# Patient Record
Sex: Female | Born: 1980 | Race: Black or African American | Hispanic: No | Smoking: Never smoker
Health system: Southern US, Community
[De-identification: ages and names within clinical notes are randomized; demographics above are authoritative.]

## PROBLEM LIST (undated history)

## (undated) DIAGNOSIS — F319 Bipolar disorder, unspecified: Secondary | ICD-10-CM

## (undated) DIAGNOSIS — IMO0001 Reserved for inherently not codable concepts without codable children: Secondary | ICD-10-CM

## (undated) DIAGNOSIS — F329 Major depressive disorder, single episode, unspecified: Secondary | ICD-10-CM

## (undated) DIAGNOSIS — T4145XA Adverse effect of unspecified anesthetic, initial encounter: Secondary | ICD-10-CM

## (undated) DIAGNOSIS — Z86711 Personal history of pulmonary embolism: Secondary | ICD-10-CM

## (undated) DIAGNOSIS — E0590 Thyrotoxicosis, unspecified without thyrotoxic crisis or storm: Secondary | ICD-10-CM

## (undated) DIAGNOSIS — F32A Depression, unspecified: Secondary | ICD-10-CM

## (undated) DIAGNOSIS — K8020 Calculus of gallbladder without cholecystitis without obstruction: Secondary | ICD-10-CM

## (undated) DIAGNOSIS — T8859XA Other complications of anesthesia, initial encounter: Secondary | ICD-10-CM

## (undated) DIAGNOSIS — I10 Essential (primary) hypertension: Secondary | ICD-10-CM

## (undated) DIAGNOSIS — Z86718 Personal history of other venous thrombosis and embolism: Secondary | ICD-10-CM

## (undated) DIAGNOSIS — K439 Ventral hernia without obstruction or gangrene: Secondary | ICD-10-CM

## (undated) HISTORY — PX: BREAST SURGERY: SHX581

## (undated) HISTORY — PX: PILONIDAL CYST / SINUS EXCISION: SUR543

## (undated) HISTORY — PX: HERNIA REPAIR: SHX51

## (undated) HISTORY — PX: OTHER SURGICAL HISTORY: SHX169

---
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
## 2001-06-19 ENCOUNTER — Encounter (INDEPENDENT_AMBULATORY_CARE_PROVIDER_SITE_OTHER): Payer: Self-pay | Admitting: Specialist

## 2001-06-19 ENCOUNTER — Inpatient Hospital Stay (HOSPITAL_COMMUNITY): Admission: AD | Admit: 2001-06-19 | Discharge: 2001-06-22 | Payer: Self-pay | Admitting: Obstetrics

## 2003-06-18 ENCOUNTER — Emergency Department (HOSPITAL_COMMUNITY): Admission: EM | Admit: 2003-06-18 | Discharge: 2003-06-18 | Payer: Self-pay | Admitting: Emergency Medicine

## 2003-08-05 ENCOUNTER — Emergency Department (HOSPITAL_COMMUNITY): Admission: EM | Admit: 2003-08-05 | Discharge: 2003-08-05 | Payer: Self-pay | Admitting: Emergency Medicine

## 2004-06-02 ENCOUNTER — Emergency Department (HOSPITAL_COMMUNITY): Admission: EM | Admit: 2004-06-02 | Discharge: 2004-06-02 | Payer: Self-pay | Admitting: Emergency Medicine

## 2004-06-06 ENCOUNTER — Emergency Department (HOSPITAL_COMMUNITY): Admission: EM | Admit: 2004-06-06 | Discharge: 2004-06-06 | Payer: Self-pay | Admitting: Family Medicine

## 2004-10-16 ENCOUNTER — Ambulatory Visit (HOSPITAL_COMMUNITY): Admission: RE | Admit: 2004-10-16 | Discharge: 2004-10-16 | Payer: Self-pay | Admitting: Obstetrics & Gynecology

## 2004-12-27 ENCOUNTER — Emergency Department (HOSPITAL_COMMUNITY): Admission: EM | Admit: 2004-12-27 | Discharge: 2004-12-27 | Payer: Self-pay | Admitting: Emergency Medicine

## 2004-12-28 ENCOUNTER — Inpatient Hospital Stay (HOSPITAL_COMMUNITY): Admission: AD | Admit: 2004-12-28 | Discharge: 2004-12-28 | Payer: Self-pay | Admitting: Obstetrics & Gynecology

## 2005-02-19 ENCOUNTER — Emergency Department (HOSPITAL_COMMUNITY): Admission: EM | Admit: 2005-02-19 | Discharge: 2005-02-19 | Payer: Self-pay | Admitting: Emergency Medicine

## 2005-03-25 ENCOUNTER — Inpatient Hospital Stay (HOSPITAL_COMMUNITY): Admission: AD | Admit: 2005-03-25 | Discharge: 2005-03-27 | Payer: Self-pay | Admitting: Obstetrics

## 2005-12-09 ENCOUNTER — Emergency Department (HOSPITAL_COMMUNITY): Admission: EM | Admit: 2005-12-09 | Discharge: 2005-12-10 | Payer: Self-pay | Admitting: *Deleted

## 2006-02-21 ENCOUNTER — Emergency Department (HOSPITAL_COMMUNITY): Admission: EM | Admit: 2006-02-21 | Discharge: 2006-02-21 | Payer: Self-pay | Admitting: Emergency Medicine

## 2006-02-26 ENCOUNTER — Emergency Department (HOSPITAL_COMMUNITY): Admission: EM | Admit: 2006-02-26 | Discharge: 2006-02-26 | Payer: Self-pay | Admitting: Emergency Medicine

## 2006-03-07 ENCOUNTER — Emergency Department (HOSPITAL_COMMUNITY): Admission: EM | Admit: 2006-03-07 | Discharge: 2006-03-07 | Payer: Self-pay | Admitting: Emergency Medicine

## 2006-03-25 ENCOUNTER — Encounter (INDEPENDENT_AMBULATORY_CARE_PROVIDER_SITE_OTHER): Payer: Self-pay | Admitting: Specialist

## 2006-03-25 ENCOUNTER — Ambulatory Visit (HOSPITAL_COMMUNITY): Admission: RE | Admit: 2006-03-25 | Discharge: 2006-03-25 | Payer: Self-pay | Admitting: General Surgery

## 2006-04-12 ENCOUNTER — Inpatient Hospital Stay (HOSPITAL_COMMUNITY): Admission: AD | Admit: 2006-04-12 | Discharge: 2006-04-12 | Payer: Self-pay | Admitting: Obstetrics & Gynecology

## 2006-07-24 ENCOUNTER — Inpatient Hospital Stay (HOSPITAL_COMMUNITY): Admission: AD | Admit: 2006-07-24 | Discharge: 2006-07-24 | Payer: Self-pay | Admitting: Obstetrics & Gynecology

## 2006-08-23 ENCOUNTER — Inpatient Hospital Stay (HOSPITAL_COMMUNITY): Admission: AD | Admit: 2006-08-23 | Discharge: 2006-08-23 | Payer: Self-pay | Admitting: Obstetrics & Gynecology

## 2006-09-14 ENCOUNTER — Inpatient Hospital Stay (HOSPITAL_COMMUNITY): Admission: AD | Admit: 2006-09-14 | Discharge: 2006-09-17 | Payer: Self-pay | Admitting: Obstetrics & Gynecology

## 2006-12-27 ENCOUNTER — Ambulatory Visit (HOSPITAL_COMMUNITY): Admission: RE | Admit: 2006-12-27 | Discharge: 2006-12-27 | Payer: Self-pay | Admitting: General Surgery

## 2006-12-27 ENCOUNTER — Encounter (INDEPENDENT_AMBULATORY_CARE_PROVIDER_SITE_OTHER): Payer: Self-pay | Admitting: Specialist

## 2007-03-19 ENCOUNTER — Emergency Department (HOSPITAL_COMMUNITY): Admission: EM | Admit: 2007-03-19 | Discharge: 2007-03-20 | Payer: Self-pay | Admitting: Emergency Medicine

## 2007-03-27 ENCOUNTER — Emergency Department (HOSPITAL_COMMUNITY): Admission: EM | Admit: 2007-03-27 | Discharge: 2007-03-27 | Payer: Self-pay | Admitting: Emergency Medicine

## 2007-04-06 ENCOUNTER — Emergency Department (HOSPITAL_COMMUNITY): Admission: EM | Admit: 2007-04-06 | Discharge: 2007-04-06 | Payer: Self-pay | Admitting: Emergency Medicine

## 2007-05-02 ENCOUNTER — Encounter: Admission: RE | Admit: 2007-05-02 | Discharge: 2007-05-30 | Payer: Self-pay | Admitting: General Surgery

## 2007-05-16 ENCOUNTER — Encounter: Admission: RE | Admit: 2007-05-16 | Discharge: 2007-05-16 | Payer: Self-pay | Admitting: General Surgery

## 2007-06-13 ENCOUNTER — Ambulatory Visit (HOSPITAL_COMMUNITY): Admission: RE | Admit: 2007-06-13 | Discharge: 2007-06-13 | Payer: Self-pay | Admitting: Obstetrics & Gynecology

## 2007-08-26 ENCOUNTER — Inpatient Hospital Stay (HOSPITAL_COMMUNITY): Admission: AD | Admit: 2007-08-26 | Discharge: 2007-08-26 | Payer: Self-pay | Admitting: Obstetrics

## 2007-08-26 ENCOUNTER — Inpatient Hospital Stay (HOSPITAL_COMMUNITY): Admission: AD | Admit: 2007-08-26 | Discharge: 2007-08-26 | Payer: Self-pay | Admitting: Obstetrics & Gynecology

## 2007-08-29 ENCOUNTER — Ambulatory Visit (HOSPITAL_COMMUNITY): Admission: RE | Admit: 2007-08-29 | Discharge: 2007-08-29 | Payer: Self-pay | Admitting: Obstetrics & Gynecology

## 2007-08-31 ENCOUNTER — Inpatient Hospital Stay (HOSPITAL_COMMUNITY): Admission: AD | Admit: 2007-08-31 | Discharge: 2007-09-05 | Payer: Self-pay | Admitting: Obstetrics & Gynecology

## 2007-09-01 ENCOUNTER — Encounter: Payer: Self-pay | Admitting: Obstetrics & Gynecology

## 2007-09-09 ENCOUNTER — Inpatient Hospital Stay (HOSPITAL_COMMUNITY): Admission: AD | Admit: 2007-09-09 | Discharge: 2007-09-11 | Payer: Self-pay | Admitting: Obstetrics & Gynecology

## 2007-09-21 ENCOUNTER — Emergency Department (HOSPITAL_COMMUNITY): Admission: EM | Admit: 2007-09-21 | Discharge: 2007-09-21 | Payer: Self-pay | Admitting: *Deleted

## 2007-11-22 ENCOUNTER — Emergency Department (HOSPITAL_COMMUNITY): Admission: EM | Admit: 2007-11-22 | Discharge: 2007-11-23 | Payer: Self-pay | Admitting: Family Medicine

## 2008-01-22 ENCOUNTER — Emergency Department (HOSPITAL_COMMUNITY): Admission: EM | Admit: 2008-01-22 | Discharge: 2008-01-22 | Payer: Self-pay | Admitting: Emergency Medicine

## 2008-02-03 ENCOUNTER — Ambulatory Visit (HOSPITAL_COMMUNITY): Admission: RE | Admit: 2008-02-03 | Discharge: 2008-02-03 | Payer: Self-pay | Admitting: Obstetrics

## 2008-02-24 ENCOUNTER — Ambulatory Visit: Payer: Self-pay | Admitting: Vascular Surgery

## 2008-06-11 ENCOUNTER — Emergency Department (HOSPITAL_COMMUNITY): Admission: EM | Admit: 2008-06-11 | Discharge: 2008-06-11 | Payer: Self-pay | Admitting: Emergency Medicine

## 2008-09-09 ENCOUNTER — Emergency Department (HOSPITAL_COMMUNITY): Admission: EM | Admit: 2008-09-09 | Discharge: 2008-09-09 | Payer: Self-pay | Admitting: Emergency Medicine

## 2008-09-14 ENCOUNTER — Encounter (INDEPENDENT_AMBULATORY_CARE_PROVIDER_SITE_OTHER): Payer: Self-pay | Admitting: Surgery

## 2008-09-14 ENCOUNTER — Ambulatory Visit (HOSPITAL_COMMUNITY): Admission: RE | Admit: 2008-09-14 | Discharge: 2008-09-14 | Payer: Self-pay | Admitting: Surgery

## 2008-09-21 ENCOUNTER — Emergency Department (HOSPITAL_COMMUNITY): Admission: EM | Admit: 2008-09-21 | Discharge: 2008-09-21 | Payer: Self-pay | Admitting: Emergency Medicine

## 2008-10-29 ENCOUNTER — Emergency Department (HOSPITAL_COMMUNITY): Admission: EM | Admit: 2008-10-29 | Discharge: 2008-10-29 | Payer: Self-pay | Admitting: Emergency Medicine

## 2009-01-13 ENCOUNTER — Emergency Department (HOSPITAL_COMMUNITY): Admission: EM | Admit: 2009-01-13 | Discharge: 2009-01-14 | Payer: Self-pay | Admitting: Emergency Medicine

## 2009-01-22 ENCOUNTER — Ambulatory Visit (HOSPITAL_BASED_OUTPATIENT_CLINIC_OR_DEPARTMENT_OTHER): Admission: RE | Admit: 2009-01-22 | Discharge: 2009-01-22 | Payer: Self-pay | Admitting: Surgery

## 2009-01-22 ENCOUNTER — Encounter (INDEPENDENT_AMBULATORY_CARE_PROVIDER_SITE_OTHER): Payer: Self-pay | Admitting: Surgery

## 2009-03-08 ENCOUNTER — Encounter: Admission: RE | Admit: 2009-03-08 | Discharge: 2009-03-08 | Payer: Self-pay | Admitting: Family Medicine

## 2009-03-23 ENCOUNTER — Inpatient Hospital Stay (HOSPITAL_COMMUNITY): Admission: AD | Admit: 2009-03-23 | Discharge: 2009-03-23 | Payer: Self-pay | Admitting: Obstetrics and Gynecology

## 2009-05-13 ENCOUNTER — Inpatient Hospital Stay (HOSPITAL_COMMUNITY): Admission: AD | Admit: 2009-05-13 | Discharge: 2009-05-13 | Payer: Self-pay | Admitting: Obstetrics & Gynecology

## 2009-05-17 ENCOUNTER — Ambulatory Visit (HOSPITAL_COMMUNITY): Admission: RE | Admit: 2009-05-17 | Discharge: 2009-05-17 | Payer: Self-pay | Admitting: Obstetrics

## 2009-06-25 ENCOUNTER — Ambulatory Visit (HOSPITAL_COMMUNITY): Admission: RE | Admit: 2009-06-25 | Discharge: 2009-06-25 | Payer: Self-pay | Admitting: Obstetrics & Gynecology

## 2009-07-06 ENCOUNTER — Inpatient Hospital Stay (HOSPITAL_COMMUNITY): Admission: AD | Admit: 2009-07-06 | Discharge: 2009-07-07 | Payer: Self-pay | Admitting: Obstetrics

## 2009-07-07 ENCOUNTER — Emergency Department (HOSPITAL_COMMUNITY): Admission: EM | Admit: 2009-07-07 | Discharge: 2009-07-07 | Payer: Self-pay | Admitting: Emergency Medicine

## 2009-08-19 ENCOUNTER — Ambulatory Visit (HOSPITAL_COMMUNITY): Admission: RE | Admit: 2009-08-19 | Discharge: 2009-08-19 | Payer: Self-pay | Admitting: Obstetrics

## 2009-09-10 ENCOUNTER — Ambulatory Visit (HOSPITAL_COMMUNITY): Admission: RE | Admit: 2009-09-10 | Discharge: 2009-09-10 | Payer: Self-pay | Admitting: Obstetrics

## 2009-09-16 ENCOUNTER — Inpatient Hospital Stay (HOSPITAL_COMMUNITY): Admission: AD | Admit: 2009-09-16 | Discharge: 2009-09-18 | Payer: Self-pay | Admitting: Obstetrics & Gynecology

## 2009-10-01 ENCOUNTER — Ambulatory Visit (HOSPITAL_COMMUNITY): Admission: RE | Admit: 2009-10-01 | Discharge: 2009-10-01 | Payer: Self-pay | Admitting: Obstetrics

## 2009-10-03 ENCOUNTER — Inpatient Hospital Stay (HOSPITAL_COMMUNITY): Admission: AD | Admit: 2009-10-03 | Discharge: 2009-10-03 | Payer: Self-pay | Admitting: Obstetrics & Gynecology

## 2009-10-08 ENCOUNTER — Ambulatory Visit: Payer: Self-pay | Admitting: Obstetrics and Gynecology

## 2009-10-08 ENCOUNTER — Inpatient Hospital Stay (HOSPITAL_COMMUNITY): Admission: AD | Admit: 2009-10-08 | Discharge: 2009-10-08 | Payer: Self-pay | Admitting: Obstetrics

## 2009-10-10 ENCOUNTER — Inpatient Hospital Stay (HOSPITAL_COMMUNITY): Admission: AD | Admit: 2009-10-10 | Discharge: 2009-10-10 | Payer: Self-pay | Admitting: Obstetrics

## 2009-10-11 ENCOUNTER — Ambulatory Visit (HOSPITAL_COMMUNITY): Admission: RE | Admit: 2009-10-11 | Discharge: 2009-10-11 | Payer: Self-pay | Admitting: Obstetrics

## 2009-10-15 ENCOUNTER — Inpatient Hospital Stay (HOSPITAL_COMMUNITY): Admission: AD | Admit: 2009-10-15 | Discharge: 2009-10-15 | Payer: Self-pay | Admitting: Obstetrics & Gynecology

## 2009-10-21 ENCOUNTER — Inpatient Hospital Stay (HOSPITAL_COMMUNITY): Admission: AD | Admit: 2009-10-21 | Discharge: 2009-10-21 | Payer: Self-pay | Admitting: Obstetrics & Gynecology

## 2009-10-22 ENCOUNTER — Ambulatory Visit (HOSPITAL_COMMUNITY): Admission: RE | Admit: 2009-10-22 | Discharge: 2009-10-22 | Payer: Self-pay | Admitting: Obstetrics

## 2009-10-25 ENCOUNTER — Inpatient Hospital Stay (HOSPITAL_COMMUNITY): Admission: AD | Admit: 2009-10-25 | Discharge: 2009-10-25 | Payer: Self-pay | Admitting: Obstetrics

## 2009-10-25 ENCOUNTER — Ambulatory Visit: Payer: Self-pay | Admitting: Family

## 2009-10-27 ENCOUNTER — Inpatient Hospital Stay (HOSPITAL_COMMUNITY): Admission: AD | Admit: 2009-10-27 | Discharge: 2009-10-31 | Payer: Self-pay | Admitting: Obstetrics

## 2009-10-27 ENCOUNTER — Encounter: Payer: Self-pay | Admitting: Obstetrics

## 2010-02-15 ENCOUNTER — Emergency Department (HOSPITAL_COMMUNITY): Admission: EM | Admit: 2010-02-15 | Discharge: 2010-02-15 | Payer: Self-pay | Admitting: Emergency Medicine

## 2010-04-10 ENCOUNTER — Emergency Department (HOSPITAL_COMMUNITY): Admission: EM | Admit: 2010-04-10 | Discharge: 2010-04-10 | Payer: Self-pay | Admitting: Emergency Medicine

## 2010-04-21 ENCOUNTER — Emergency Department (HOSPITAL_COMMUNITY): Admission: EM | Admit: 2010-04-21 | Discharge: 2010-04-21 | Payer: Self-pay | Admitting: Emergency Medicine

## 2010-05-23 ENCOUNTER — Emergency Department (HOSPITAL_COMMUNITY): Admission: EM | Admit: 2010-05-23 | Discharge: 2010-05-24 | Payer: Self-pay | Admitting: Emergency Medicine

## 2010-05-24 ENCOUNTER — Emergency Department (HOSPITAL_COMMUNITY): Admission: EM | Admit: 2010-05-24 | Discharge: 2010-05-24 | Payer: Self-pay | Admitting: Emergency Medicine

## 2010-09-28 ENCOUNTER — Encounter: Payer: Self-pay | Admitting: Obstetrics

## 2010-11-20 LAB — URINALYSIS, ROUTINE W REFLEX MICROSCOPIC
Hgb urine dipstick: NEGATIVE
Ketones, ur: NEGATIVE mg/dL
Protein, ur: NEGATIVE mg/dL
Urobilinogen, UA: 0.2 mg/dL (ref 0.0–1.0)

## 2010-11-20 LAB — BASIC METABOLIC PANEL
BUN: 5 mg/dL — ABNORMAL LOW (ref 6–23)
CO2: 26 mEq/L (ref 19–32)
Calcium: 9.1 mg/dL (ref 8.4–10.5)
Creatinine, Ser: 0.63 mg/dL (ref 0.4–1.2)
GFR calc Af Amer: >60 mL/min (ref >60)
Glucose, Bld: 96 mg/dL (ref 70–99)

## 2010-11-20 LAB — HEPATIC FUNCTION PANEL
Bilirubin, Direct: 0.2 mg/dL (ref 0.0–0.3)
Indirect Bilirubin: 0.6 mg/dL (ref 0.3–0.9)
Total Protein: 7.5 g/dL (ref 6.0–8.3)

## 2010-11-20 LAB — CBC
MCH: 33 pg (ref 26.0–34.0)
MCHC: 35.2 g/dL (ref 30.0–36.0)
MCV: 94 fL (ref 78.0–100.0)
Platelets: ADEQUATE 10*3/uL (ref 150–400)
RDW: 12.5 % (ref 11.5–15.5)

## 2010-11-20 LAB — DIFFERENTIAL
Basophils Absolute: 0 10*3/uL (ref 0.0–0.1)
Lymphs Abs: 1 10*3/uL (ref 0.7–4.0)
Monocytes Absolute: 0.2 10*3/uL (ref 0.1–1.0)
Neutro Abs: 5.7 10*3/uL (ref 1.7–7.7)

## 2010-11-21 LAB — URINE MICROSCOPIC-ADD ON

## 2010-11-21 LAB — DIFFERENTIAL
Eosinophils Relative: 4 % (ref 0–5)
Lymphocytes Relative: 42 % (ref 12–46)
Lymphs Abs: 2.4 10*3/uL (ref 0.7–4.0)
Monocytes Absolute: 0.3 10*3/uL (ref 0.1–1.0)
Monocytes Relative: 6 % (ref 3–12)

## 2010-11-21 LAB — URINALYSIS, ROUTINE W REFLEX MICROSCOPIC
Bilirubin Urine: NEGATIVE
Ketones, ur: NEGATIVE mg/dL
Nitrite: NEGATIVE
Specific Gravity, Urine: 1.021 (ref 1.005–1.030)
Urobilinogen, UA: 0.2 mg/dL (ref 0.0–1.0)
pH: 7.5 (ref 5.0–8.0)

## 2010-11-21 LAB — CBC
MCH: 32.8 pg (ref 26.0–34.0)
MCHC: 34.9 g/dL (ref 30.0–36.0)
Platelets: 380 10*3/uL (ref 150–400)
RBC: 4.07 MIL/uL (ref 3.87–5.11)

## 2010-11-21 LAB — COMPREHENSIVE METABOLIC PANEL
AST: 20 U/L (ref 0–37)
Albumin: 3.8 g/dL (ref 3.5–5.2)
Calcium: 9.1 mg/dL (ref 8.4–10.5)
Chloride: 105 mEq/L (ref 96–112)
Creatinine, Ser: 0.67 mg/dL (ref 0.4–1.2)
GFR calc Af Amer: >60 mL/min (ref >60)
Total Protein: 7 g/dL (ref 6.0–8.3)

## 2010-11-23 LAB — URINALYSIS, ROUTINE W REFLEX MICROSCOPIC
Glucose, UA: NEGATIVE mg/dL
Ketones, ur: >80 mg/dL — AB
pH: 6 (ref 5.0–8.0)

## 2010-11-23 LAB — COMPREHENSIVE METABOLIC PANEL
ALT: 16 U/L (ref 0–35)
Albumin: 2.9 g/dL — ABNORMAL LOW (ref 3.5–5.2)
Alkaline Phosphatase: 100 U/L (ref 39–117)
BUN: 3 mg/dL — ABNORMAL LOW (ref 6–23)
Chloride: 104 mEq/L (ref 96–112)
Potassium: 3.7 mEq/L (ref 3.5–5.1)
Sodium: 134 mEq/L — ABNORMAL LOW (ref 135–145)
Total Bilirubin: 1.1 mg/dL (ref 0.3–1.2)

## 2010-11-23 LAB — CBC
HCT: 39.4 % (ref 36.0–46.0)
Hemoglobin: 13.5 g/dL (ref 12.0–15.0)
Platelets: 256 10*3/uL (ref 150–400)
WBC: 8.6 10*3/uL (ref 4.0–10.5)

## 2010-11-23 LAB — URINE MICROSCOPIC-ADD ON

## 2010-11-23 LAB — URINE CULTURE

## 2010-11-28 LAB — COMPREHENSIVE METABOLIC PANEL
ALT: 12 U/L (ref 0–35)
ALT: 14 U/L (ref 0–35)
Albumin: 2.9 g/dL — ABNORMAL LOW (ref 3.5–5.2)
Alkaline Phosphatase: 103 U/L (ref 39–117)
Alkaline Phosphatase: 124 U/L — ABNORMAL HIGH (ref 39–117)
BUN: 3 mg/dL — ABNORMAL LOW (ref 6–23)
CO2: 19 mEq/L (ref 19–32)
Calcium: 8.9 mg/dL (ref 8.4–10.5)
Chloride: 108 mEq/L (ref 96–112)
GFR calc Af Amer: >60 mL/min (ref >60)
GFR calc non Af Amer: >60 mL/min (ref >60)
Glucose, Bld: 75 mg/dL (ref 70–99)
Glucose, Bld: 99 mg/dL (ref 70–99)
Potassium: 3.4 mEq/L — ABNORMAL LOW (ref 3.5–5.1)
Potassium: 3.5 mEq/L (ref 3.5–5.1)
Sodium: 135 mEq/L (ref 135–145)
Total Bilirubin: 0.6 mg/dL (ref 0.3–1.2)
Total Protein: 6.1 g/dL (ref 6.0–8.3)

## 2010-11-28 LAB — URINALYSIS, ROUTINE W REFLEX MICROSCOPIC
Bilirubin Urine: NEGATIVE
Glucose, UA: NEGATIVE mg/dL
Ketones, ur: 15 mg/dL — AB
Ketones, ur: 40 mg/dL — AB
Nitrite: NEGATIVE
Nitrite: POSITIVE — AB
Protein, ur: NEGATIVE mg/dL
Specific Gravity, Urine: >1.03 — ABNORMAL HIGH (ref 1.005–1.030)
pH: 6 (ref 5.0–8.0)

## 2010-11-28 LAB — URINE CULTURE
Colony Count: 30000
Colony Count: 40000

## 2010-11-28 LAB — CBC
HCT: 26.9 % — ABNORMAL LOW (ref 36.0–46.0)
HCT: 33.2 % — ABNORMAL LOW (ref 36.0–46.0)
HCT: 34 % — ABNORMAL LOW (ref 36.0–46.0)
Hemoglobin: 11.4 g/dL — ABNORMAL LOW (ref 12.0–15.0)
Hemoglobin: 11.8 g/dL — ABNORMAL LOW (ref 12.0–15.0)
Hemoglobin: 9.2 g/dL — ABNORMAL LOW (ref 12.0–15.0)
MCHC: 34.8 g/dL (ref 30.0–36.0)
MCHC: 34.9 g/dL (ref 30.0–36.0)
MCV: 96.9 fL (ref 78.0–100.0)
Platelets: 251 10*3/uL (ref 150–400)
Platelets: 252 10*3/uL (ref 150–400)
Platelets: 259 10*3/uL (ref 150–400)
RBC: 2.73 MIL/uL — ABNORMAL LOW (ref 3.87–5.11)
RBC: 3.4 MIL/uL — ABNORMAL LOW (ref 3.87–5.11)
RBC: 3.42 MIL/uL — ABNORMAL LOW (ref 3.87–5.11)
RDW: 12.9 % (ref 11.5–15.5)
RDW: 12.9 % (ref 11.5–15.5)
RDW: 13 % (ref 11.5–15.5)
RDW: 13.1 % (ref 11.5–15.5)
WBC: 7.5 10*3/uL (ref 4.0–10.5)
WBC: 7.6 10*3/uL (ref 4.0–10.5)

## 2010-11-28 LAB — LACTATE DEHYDROGENASE: LDH: 172 U/L (ref 94–250)

## 2010-11-28 LAB — URIC ACID
Uric Acid, Serum: 5.4 mg/dL (ref 2.4–7.0)
Uric Acid, Serum: 6 mg/dL (ref 2.4–7.0)

## 2010-11-28 LAB — URINE MICROSCOPIC-ADD ON

## 2010-11-28 LAB — RPR: RPR Ser Ql: NONREACTIVE

## 2010-12-12 LAB — COMPREHENSIVE METABOLIC PANEL
AST: 24 U/L (ref 0–37)
Albumin: 3.2 g/dL — ABNORMAL LOW (ref 3.5–5.2)
Alkaline Phosphatase: 58 U/L (ref 39–117)
BUN: 3 mg/dL — ABNORMAL LOW (ref 6–23)
Chloride: 107 mEq/L (ref 96–112)
GFR calc Af Amer: >60 mL/min (ref >60)
Potassium: 3.7 mEq/L (ref 3.5–5.1)
Sodium: 136 mEq/L (ref 135–145)
Total Protein: 6.1 g/dL (ref 6.0–8.3)

## 2010-12-12 LAB — URINALYSIS, ROUTINE W REFLEX MICROSCOPIC
Bilirubin Urine: NEGATIVE
Glucose, UA: NEGATIVE mg/dL
Hgb urine dipstick: NEGATIVE
Specific Gravity, Urine: 1.01 (ref 1.005–1.030)

## 2010-12-12 LAB — CBC
HCT: 38.2 % (ref 36.0–46.0)
Platelets: 249 10*3/uL (ref 150–400)
RDW: 12.5 % (ref 11.5–15.5)
WBC: 10.8 10*3/uL — ABNORMAL HIGH (ref 4.0–10.5)

## 2010-12-12 LAB — URINE MICROSCOPIC-ADD ON: RBC / HPF: NONE SEEN RBC/hpf (ref <3)

## 2010-12-22 LAB — BASIC METABOLIC PANEL
BUN: 7 mg/dL (ref 6–23)
CO2: 24 mEq/L (ref 19–32)
Calcium: 9.8 mg/dL (ref 8.4–10.5)
Glucose, Bld: 50 mg/dL — ABNORMAL LOW (ref 70–99)
Sodium: 141 mEq/L (ref 135–145)

## 2010-12-22 LAB — CBC
HCT: 40.7 % (ref 36.0–46.0)
Hemoglobin: 13.9 g/dL (ref 12.0–15.0)
MCHC: 34.2 g/dL (ref 30.0–36.0)
Platelets: 344 10*3/uL (ref 150–400)
RDW: 12.4 % (ref 11.5–15.5)

## 2011-01-06 ENCOUNTER — Emergency Department (HOSPITAL_COMMUNITY)
Admission: EM | Admit: 2011-01-06 | Discharge: 2011-01-07 | Disposition: A | Discharge status: Home / Self Care | Payer: Medicaid Other | Attending: Emergency Medicine | Admitting: Emergency Medicine

## 2011-01-06 DIAGNOSIS — R21 Rash and other nonspecific skin eruption: Secondary | ICD-10-CM | POA: Insufficient documentation

## 2011-01-06 DIAGNOSIS — I889 Nonspecific lymphadenitis, unspecified: Secondary | ICD-10-CM | POA: Insufficient documentation

## 2011-01-06 DIAGNOSIS — I10 Essential (primary) hypertension: Secondary | ICD-10-CM | POA: Insufficient documentation

## 2011-01-07 ENCOUNTER — Emergency Department (HOSPITAL_COMMUNITY)
Admission: EM | Admit: 2011-01-07 | Discharge: 2011-01-07 | Disposition: A | Discharge status: Home / Self Care | Payer: Medicaid Other | Attending: Emergency Medicine | Admitting: Emergency Medicine

## 2011-01-07 DIAGNOSIS — R21 Rash and other nonspecific skin eruption: Secondary | ICD-10-CM | POA: Insufficient documentation

## 2011-01-07 DIAGNOSIS — R599 Enlarged lymph nodes, unspecified: Secondary | ICD-10-CM | POA: Insufficient documentation

## 2011-01-07 DIAGNOSIS — L299 Pruritus, unspecified: Secondary | ICD-10-CM | POA: Insufficient documentation

## 2011-01-07 DIAGNOSIS — Z79899 Other long term (current) drug therapy: Secondary | ICD-10-CM | POA: Insufficient documentation

## 2011-01-07 DIAGNOSIS — I10 Essential (primary) hypertension: Secondary | ICD-10-CM | POA: Insufficient documentation

## 2011-01-07 DIAGNOSIS — I889 Nonspecific lymphadenitis, unspecified: Secondary | ICD-10-CM | POA: Insufficient documentation

## 2011-01-07 DIAGNOSIS — M542 Cervicalgia: Secondary | ICD-10-CM | POA: Insufficient documentation

## 2011-01-20 NOTE — H&P (Signed)
NAME:  [name], [name] NO.:  [id number]   MEDICAL RECORD NO.:  [id number]          PATIENT TYPE:  INP   LOCATION:  9171                          FACILITY:  [facility]   PHYSICIAN:  Roseanna Rainbow, M.D.DATE OF BIRTH:  1981-04-15   DATE OF ADMISSION:  08/31/2007  DATE OF DISCHARGE:                              HISTORY & PHYSICAL   CHIEF COMPLAINT:  The patient is a 30 year old, P3 with an estimated  date of confinement of September 13, 2007, with elevated blood pressures  and a headache.   HISTORY OF PRESENT ILLNESS:  This patient had been followed in the  office for pregnancy-induced hypertension.  She also reports onset of a  headache for several hours prior to presentation that is frontal.  She  also describes blurred vision.   ALLERGIES:  PENICILLIN.   MEDICATIONS:  Please see the medication reconciliation form.   OB RISK FACTORS:  Please see the above GBS positive.   PRENATAL LABORATORY DATA:  Platelet count 270,000.  Hemoglobin 13,  hematocrit 38.  Urine culture and sensitivity insignificant growth.  A 3-  hour GTT normal.  A 1-hour GTT 151.  HIV nonreactive.  Sickle cell  negative.  Rubella immune.  RPR nonreactive.  Blood type O positive,  antibody screen negative.  Hepatitis B surface antigen negative.  GC  probe negative.  Pap smear negative.  Chlamydia probe negative.   PAST OBSTETRICAL HISTORY:  She is status post four NSVDs, uncomplicated.   PAST GYNECOLOGICAL HISTORY:  She denies.   PAST MEDICAL HISTORY:  Migraine headaches, chronic right upper extremity  lymphedema, hidradenitis suppurativa.   PAST SURGICAL HISTORY:  Removal of sweat glands, bilateral axillary x2.   SOCIAL HISTORY:  She is a Futures trader.  She is engaged living with the  significant.  Does not give any significant history of alcohol usage.  She has no significant smoking history.  Denies illicit drug use.   FAMILY HISTORY:  Hypertension, kidney stones, migraine headaches.   PHYSICAL EXAMINATION:  VITAL SIGNS:  Blood pressures 140-160s/90s-110s.  Fetal heart tracing reassuring.  Tocodynamometer with irregular uterine  contractions.  GENERAL:  Mild distress.  NEUROLOGIC:  Nonfocal.  PELVIC:  Per the RN, the cervix is closed, posterior and long.   LABORATORY DATA AND X-RAY FINDINGS:  Hemoglobin 12, hematocrit 33.7,  platelet count 223,000.  Creatinine 0.71.  SGOT and SGPT 21 and 11.  LDH  182.  Uric acid 7.2.  Urinalysis 30 mg/dL protein.   ASSESSMENT:  Multipara at term, severe pregnancy-induced hypertension  with fetal heart tracing consistent with fetal well-being.  Unfavorable  Bishop score.  Group B Streptococcus positive.   PLAN:  1. Admission.  2. Induction of labor.  Will begin with Cervidil to be followed by      Pitocin and AROM.  GBS prophylaxis in labor.  Magnesium sulfate      seizure prophylaxis.  Analgesics for headache as needed.      Roseanna Rainbow, M.D.  Electronically Signed     LAJ/MEDQ  D:  08/31/2007  T:  09/01/2007  Job:  [id number]

## 2011-01-20 NOTE — Consult Note (Signed)
NEW PATIENT CONSULTATION   [name], [name]  DOB:  [birth year]                                       02/24/2008  YHCWC#:[id number]   The patient presents for evaluation of lymphedema in her right arm.  She  has had a history of surgery for hydradenitis bilaterally in both axilla  in the past.  This was on the left arm in 2007 and April of 2008 on the  right by Dr. Karn Pickler.  She had some wound-healing issues apparently in  her right axilla and subsequent total healing.  She has noted  progressive swelling since that time.  She has seen several specialists,  including physical therapy, with evaluation of lymphedema and does have  a lymphedema stocking on her right arm.  Apparently, it had been  recommended that she undergo lymphangiogram, but deferred this due to  pain.  Currently, she has minimal swelling and minimal discomfort in her  right arm.   PAST HISTORY:  Significant for hypertension.  Does have non-insulin-  dependent diabetes.  She has no history of premature atherosclerotic  disease in her family.   SOCIAL HISTORY:  She is single, but engaged.  She has 5 children.  She  does not work outside the home.  She does not smoke, or drink alcohol.   REVIEW OF SYSTEMS:  Positive for weight loss.  She does weigh 185  pounds.  She is 5 feet 1 inches tall.  She does report chest pain and  tightness, prior history of dizziness, headaches, changes in eyesight.   MEDICATION ALLERGIES:  Penicillin.   CURRENT MEDICATIONS:  Toprol.   PHYSICAL EXAM:  Well-developed white female appearing stated age of 3.  She does have a lymphedema stocking on currently, and this was removed  for evaluation.  She does not have any thickening currently, and has  mild swelling compared to her left arm.  She does have a palpable radial  pulse.  She has undergone prior venous duplex evaluation.   I had a long discussion with the patient.  I explained that this is a  chronic problem  that does not have any surgical treatment.  I would not  recommend any further invasive studies such as lymphangiogram.  I  explained the importance of continued wearing of compression garments to  prevent progression of the changes of lymphedema, and also the  importance of being established with a certified lymphedema therapist.  We have referred her to Alvira Monday at [facility] for establishment of long-term  lymphedema treatment and monitoring.  She will see [place] on an as needed  basis.   Larina Earthly, M.D.  Electronically Signed   TFE/MEDQ  D:  02/24/2008  T:  02/27/2008  Job:  [id number]   cc:   Jocelyn Lamer D. Pecola Leisure, M.D.  Alvira Monday

## 2011-01-20 NOTE — H&P (Signed)
NAME:  [name], [name] NO.:  [id number]   MEDICAL RECORD NO.:  [id number]          PATIENT TYPE:  OBV   LOCATION:  9317                          FACILITY:  [facility]   PHYSICIAN:  Roseanna Rainbow, M.D.DATE OF BIRTH:  08-Sep-1980   DATE OF ADMISSION:  09/09/2007  DATE OF DISCHARGE:                              HISTORY & PHYSICAL   CHIEF COMPLAINT:  The patient is a 30 year old status post a recent  NSVD, now complaining of fever and back pain.   HISTORY OF PRESENT ILLNESS:  Please see the above.  She recently had a  vaginal delivery.  Antepartum and intrapartum, she was managed for  severe pregnancy- induced hypertension.  She reports young children at  home, having a recent viral illness.  She reports minimal dysuria.  She  does complain of lower back pain in the midline.  She reports fevers  with a T-max of 103 at home.   ALLERGIES:  PENICILLIN.   MEDICATIONS:  Please see the reconciliation form.   PAST OBSTETRICAL HISTORY:  She is status post five NSVDs.  Please see  the above.   PAST GYN HISTORY:  She denies.   PAST MEDICAL HISTORY:  Migraine headaches, chronic right upper extremity  lymphedema, hidradenitis suppurativa.   PAST SURGICAL HISTORY:  Removal of sweat gland, axillary bilateral.   SOCIAL HISTORY:  She is a homemaker.  She is engaged, living with her  significant other.  She does not give any significant history of alcohol  usage.  She has no significant smoking history.  She denies illicit drug  use.   FAMILY HISTORY:  Chronic hypertension, kidney stones and migraine  headaches.   REVIEW OF SYSTEMS:  GU:  Scant lochia, see the above.  GENERAL:  Please  see the above.  SKIN:  Breast.  She is not to nursing.  She denies any  breast engorgement.   PHYSICAL EXAM:  VITAL SIGNS:  Blood pressure 133/87, initial temperature  98.9, T-max 102.6, pulse 109.  GENERAL:  Nontoxic-appearing.  NEUROLOGIC:  Exam is nonfocal.  No nuchal rigidity.   Negative Kernig's,  Brudzinski's.  ABDOMEN:  Fundus nontender, firm, pelvic exam deferred.  EXTREMITIES:  Nontender.  BACK:  No costovertebral angle tenderness.  BREAST:  Soft and nontender.   LABORATORY:  Urinalysis, catheterized specimen negative.  CBC:  White  blood cell count 16,000, hemoglobin 12.3.  Metabolic profile was normal.   ASSESSMENT:  Postpartum fever, questionable origin.  Recent exposure to  likely viral illness.   PLAN:  Admission.  Follow white blood cell count.  We will check blood  cultures, urine culture and sensitivity.  Broad-spectrum parenteral  antibiotics, IV hydration and supportive care.  Continue  antihypertensive medications.      Roseanna Rainbow, M.D.  Electronically Signed     LAJ/MEDQ  D:  09/10/2007  T:  09/10/2007  Job:  [id number]

## 2011-01-20 NOTE — Op Note (Signed)
NAME:  [name], YOON NO.:  [id number]   MEDICAL RECORD NO.:  [id number]          PATIENT TYPE:  AMB   LOCATION:  DSC                          FACILITY:  [facility]   PHYSICIAN:  Sandria Bales. Ezzard Standing, M.D.  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   PROCEDURE:  Excision of chronically infected wound approximately 1 x 4  cm skin incision.   SURGEON:  Sandria Bales. Ezzard Standing, MD   ANESTHESIA:  10 mL of 1% Xylocaine.   COMPLICATIONS:  None.   INDICATION FOR PROCEDURE:  Ms. [name] is a 29 year old black female who  has had recurrent infections in the right medial inframammary fold.  She  now comes for excision of this area.  She understands the risk including  infection, bleeding and recurrence of the infections.   PROCEDURE:  The patient in supine position her medial inframammary fold  was prepped with antibiotic solution and sterilely draped.  I  infiltrated the skin with 10 mL of 1% Xylocaine.  I then tried to  ellipse out the scar and nodule that I could palpate.  The excised area  totaled about a 1 x 4 cm incision.  I irrigated the wound, closed the  wound with interrupted 3-0 nylon sutures.  I plan to leave these in for  10-14 days and remove them.  She knows to call for any interval problem.      Sandria Bales. Ezzard Standing, M.D.  Electronically Signed     DHN/MEDQ  D:  01/22/2009  T:  01/23/2009  Job:  [id number]   cc:   Jocelyn Lamer D. Pecola Leisure, M.D.

## 2011-01-20 NOTE — Op Note (Signed)
NAME:  [name], [name] NO.:  [id number]   MEDICAL RECORD NO.:  [id number]          PATIENT TYPE:  AMB   LOCATION:  SDS                          FACILITY:  [facility]   PHYSICIAN:  Sandria Bales. Ezzard Standing, M.D.  DATE OF BIRTH:  1981-06-11   DATE OF PROCEDURE:  09/14/2008  DATE OF DISCHARGE:  09/14/2008                               OPERATIVE REPORT   PREOPERATIVE DIAGNOSIS:  Pilonidal sinus with recurrent inflammation.   POSTOPERATIVE DIAGNOSIS:  Pilonidal sinus with recurrent inflammation.   PROCEDURE:  Excision of pilonidal cyst/sinus.   SURGEON:  Sandria Bales. Ezzard Standing, MD   No first assistant.   ANESTHESIA:  General.   ESTIMATED BLOOD LOSS:  Minimal.   INDICATIONS FOR PROCEDURE:  Ms. [name] is a 30 year old black female who  is a patient of Dr. Leilani Able who had a pilonidal cyst with recurrent  inflammation for 3-4 years.  She actually saw Dr. Lurene Shadow at one time but  never had this operated on  and now comes for excision of this cyst.   The indications and potential complications were explained to the  patient.  Potential complication include , but are not limited to,  bleeding, infection, recurrence of cyst, and nerve injury.   OPERATIVE NOTE:  The patient was placed in a prone position with her  buttocks taped apart.  Between her buttocks and over the pilonidal cyst,  she was prepped with CHG and sterilely draped.  A time-out was held  identifying the patient and the procedure.   I first tried to inject the sinus with methylene blue but did not have  much success.  I then excised an area about 4 cm in length of tissue  which included the pilonidal area and I think I removed the sinus  tracts.   I then packed the wound with Betadine gauze and sterilely draped it.  She will start her dressing changes tomorrow morning, see me back next  week in about 4-5 days for wound check.     Sandria Bales. Ezzard Standing, M.D.  Electronically Signed    DHN/MEDQ  D:  09/14/2008  T:   09/15/2008  Job:  [id number]   cc:   Jocelyn Lamer D. Pecola Leisure, M.D.

## 2011-01-23 NOTE — Discharge Summary (Signed)
NAME:  [name], [name] NO.:  [id number]   MEDICAL RECORD NO.:  [id number]          PATIENT TYPE:  INP   LOCATION:  9373                          [facility]ACILITY[facility]   PHYSICIAN:  Charles A. Clearance Coots, M.D.DATE OF BIRTH:  June 14, 1981   DATE OF ADMISSION:  08/31/2007  DATE OF DISCHARGE:  09/05/2007                               DISCHARGE SUMMARY   ADMITTING DIAGNOSES:  1. Term pregnancy.  2. Pregnancy-induced hypertension.   DISCHARGE DIAGNOSES:  1. Term pregnancy.  2. Pregnancy-induced hypertension.  3. Status post normal spontaneous vaginal delivery viable female infant      on September 01, 2007 at 1452  Apgar's of 8 at 1 minute and 9 at 5 minutes, weight of 3679 grams,  length of 53.34 cm.  Mother and infant discharged home in good  condition.   REASON FOR ADMISSION:  This is a 30 year old para 3 with estimated date  of confinement of September 13, 2007 presented with increased blood  pressures and headache.  The patient was followed in the office for  pregnancy-induced hypertension.  She also reports onset of headache for  several hours prior to presentation that was frontal. The patient also  describes blurred vision   PAST MEDICAL HISTORY:  Surgery removal sweat glands in bilateral  axillary areas x2.   ILLNESSES:  Migraines, chronic upper extremity lymphedema, hidradenitis  suppurativa.   MEDICATIONS:  Prenatal vitamins.   ALLERGIES:  PENICILLIN.   SOCIAL HISTORY:  Homemaker. Engaged living with significant other.  Negative history of tobacco, alcohol or recreational drug use.   PHYSICAL EXAMINATION:  GENERAL:  Well-developed, well-nourished female  in no acute distress.  VITAL SIGNS:  Afebrile, blood pressure 140-160/90-110.  LUNGS:  Clear to auscultation bilaterally.  HEART:  Regular rate and rhythm.  ABDOMEN: Gravid and nontender.  PELVIC:  Cervix long, closed and vertex at minus three station.   IMPRESSION:  1. Term pregnancy.  2. Pregnancy-induced  hypertension.   PLAN:  Admitted for induction of labor.   ADMITTING LABORATORY VALUES:  Hemoglobin 12, hematocrit 33, white blood  cell count 6500, platelets 223,000.  Comprehensive metabolic panel was  within normal limits.   HOSPITAL COURSE:  The patient was admitted and underwent cervical  ripening with Cervidil overnight followed by Pitocin following morning.  Magnesium sulfate and seizure prophylaxis was also instituted.  The  patient progressed rapidly to normal spontaneous vaginal delivery of a  viable infant on September 01, 2007.  There were no complications.  Postpartum course was uncomplicated.  The patient was discharged home on  postpartum day #4 in good condition. Discharge laboratory values  hemoglobin 9.7, hematocrit 27.5, white blood cell count 11,200,  platelets 176,000.   DISCHARGE DISPOSITION:  Medications continue prenatal vitamins.  Ibuprofen was prescribed for pain labetalol and Norvasc was prescribed  for blood pressure control.  Routine written instructions were given for  discharge after vaginal delivery.  The patient was given preeclampsia  precautions.  She is to call the office for a follow-up appointment 2  weeks.      Charles A. Clearance Coots, M.D.  Electronically Signed  CAH/MEDQ  D:  09/27/2007  T:  09/28/2007  Job:  [id number]

## 2011-01-23 NOTE — Op Note (Signed)
NAMECELE, MOTE NO.:  [id number]   MEDICAL RECORD NO.:  [id number]          PATIENT TYPE:  AMB   LOCATION:  SDS                          FACILITY:  [facility]   PHYSICIAN:  Leonie Man, M.D.   DATE OF BIRTH:  06/26/1981   DATE OF PROCEDURE:  12/27/2006  DATE OF DISCHARGE:                               OPERATIVE REPORT   PREOPERATIVE DIAGNOSIS:  Hidradenitis, right axilla.   POSTOPERATIVE DIAGNOSIS:  Hidradenitis, right axilla.   PROCEDURE:  Excision of hidradenitis, left axilla.   SURGEON:  Leonie Man, M.D.   ASSISTANT:  Magnus Ivan, RNFA.   ANESTHESIA:  General.   NOTE:  Ms. [name] is a 30 year old female status post excision of  hidradenitis of the left axilla who presents now with significant  hidradenitis of the right axilla extending from the anterior to  posterior axillary line down onto the medial upper arm and down onto the  upper portion of her breast.  This has been previously treated with  antibiotics on several occasions without ever completely clearing up.  The patient comes to the operating room now after a 10-day course of  cephalexin for excision of this area of hidradenitis.   PROCEDURE:  Following the induction of satisfactory general anesthesia,  the patient is positioned supinely and turned slightly to the left with  the right arm extended upward and supported with pillows.  The axilla is  prepped and draped to be included in a sterile operative field.   I made a very large elliptical incision extending from just above the  anterior portion of the deltoid on the right, inclusive of all of the  axillary skin tissue and carried down to just beyond the anterior border  of the latissimus dorsi muscle.  The incision is deepened through skin  and partially through the skin and subcutaneous tissues, and the entire  axillary fat pad is excised and removed and forwarded for pathologic  evaluation.  Hemostasis was obtained with  electrocautery and with suture  ligatures of 3-0 Vicryl.  Sponge and instrument counts were then  verified.  The subcutaneous tissues were closed with multiple sutures of  3-0 Vicryl.  The skin was closed with a running suture of 4-0 Monocryl  and then reinforced with Steri-Strips.  Sterile dressings were applied,  the anesthetic reversed, and the patient removed from the operating room  to the recovery room in stable condition.  She tolerated the procedure  well.      Leonie Man, M.D.  Electronically Signed     PB/MEDQ  D:  12/27/2006  T:  12/27/2006  Job:  [id number]

## 2011-01-23 NOTE — Op Note (Signed)
[name], [name] NO.:  [id number]   MEDICAL RECORD NO.:  [id number]          PATIENT TYPE:  AMB   LOCATION:  SDS                          FACILITY:  [facility]   PHYSICIAN:  Leonie Man, M.D.   DATE OF BIRTH:  July 16, 1981   DATE OF PROCEDURE:  03/25/2006  DATE OF DISCHARGE:                                 OPERATIVE REPORT   PREOPERATIVE DIAGNOSIS:  Hidradenitis left axilla.   POSTOPERATIVE DIAGNOSIS:  Hidradenitis left axilla.   PROCEDURE:  Excision hidradenitis left axilla.   SURGEON:  Leonie Man, M.D.   ASSISTANT:  RNFA   ANESTHESIA:  General.   NOTE:  Ms. [name] is a 29 year old female who is currently approximately [redacted]  weeks gestation with severe hidradenitis of the left axilla.  She presents,  now, for excision of this area after following treatment with antibiotics.  She understands the risks and potential benefits of surgery; including the  risks to the fetus, but wishes to proceed.  Nonetheless, I spoke with Dr.  Tamela Oddi prior to bringing the patient; and she told me that it would  be all right for the patient to undergo surgery.   DESCRIPTION OF PROCEDURE:  Following the induction of satisfactory general  anesthesia with the patient positioned supinely, the left arm was extended  laterally and the left side of the chest turned up, so as to expose the area  of hidradenitis.  This area appeared to measure approximately 15 x 5 cm.  An  elliptical incision is made around the entire mass, leaving a margin of at  least 1 cm.  This was deepened through the skin and  subcutaneous tissue;  and the entire elliptical wedge of axillary skin was removed; and forwarded  for pathologic evaluation.  There was no deep tissue infected noted.   Sponge, instrument, and sharp counts were noted to be correct; and the wound  was closed in layers as follows: The subcutaneous tissue was closed with  interrupted sutures of 2-0 Vicryl; and the skin was closed  with running  suture of 4-0 Monocryl.  This was then reinforced with Steri-Strips.  Sterile dressings applied.  The anesthetic reversed; the patient removed  from the operating room to the recovery room in stable condition.  Tolerated  the procedure well.      Leonie Man, M.D.  Electronically Signed     PB/MEDQ  D:  03/25/2006  T:  03/25/2006  Job:  [id number]

## 2011-01-23 NOTE — H&P (Signed)
[facility]  Patient:    [name]                        MRN: [id number] Adm. Date:  [id number] Attending:  Leonard Schwartz Dictator:   Miguel Dibble, C.N.M.                         History and Physical  DATE OF BIRTH:                Feb 19, 1981.  HISTORY OF PRESENT ILLNESS:   This is an 30 year old gravida 1, para 0 at 40-2/[redacted] weeks pregnant who has been experiencing early labor for the last 36 hours. She has progressed from 1.5 cm at approximately noon yesterday to 4 cm, 90% effaced, vertex at -2, intact bag of waters.  She denies any leaking or bleeding.  She has a history of negative group beta strep.  She is a teen with adequate social support; her mother is present with her.  Father of the baby is no longer involved.  She has had an uncomplicated pregnancy and had entered prenatal care at 24 weeks.  PRENATAL LABORATORY DATA:     At entry into the practice:  Hemoglobin 11.7, hematocrit 33.3, platelets 305,000.  Blood type and Rh:  O-positive, Rh-antibodies negative.  Sickle cell trait negative.  VDRL nonreactive.  Rubella titer positive. Hepatitis B surface antigen negative.  HIV nonreactive.  Pap smear within normal limits.  Gonorrhea and Chlamydia cultures negative.  HSV is negative.  Glucose challenge test is within normal limits.  Group beta strep at 36 weeks is negative.  ALLERGIES:                    No known drug allergies.  MEDICAL HISTORY:              Usual childhood diseases.  UTIs in the past.  FAMILY HISTORY:               Maternal grandmother and maternal grandfather with MIs.  Mother with hypertension, on medication.  Maternal aunt with insulin-dependent diabetes mellitus.  GENETIC HISTORY:              Negative.  SOCIAL HISTORY:               Father of the baby is not currently involved; mother and family members providing adequate social support.  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  Father of the baby was also a 12th grade student.  Denies smoking, alcohol or drug abuse.  PHYSICAL EXAMINATION:  HEENT:                        Within normal limits.  LUNGS:                        Bilaterally clear.  HEART:                        Regular rate and rhythm.  ABDOMEN:                      Soft, nontender.  Contractions every two to three  minutes.  Fetal heart rate is reassuring.  PELVIC:  Cervix is 4 cm, 90% effaced, vertex -2, slightly posterior.  Intact bag of waters.  EXTREMITIES:                  Trace edema.  DTRs +1.  ASSESSMENT:                   1. Primigravida at term, teen, with adequate support.                               2. Negative group beta streptococcus.  PLAN:                         Admit to labor and delivery.  Notify Dr. Georgina Peer of admission and status.  Routine [facility] orders including analgesia.  Anticipate normal spontaneous vaginal delivery. DD:  10/02/99 TD:  10/02/99 Job: [id number]

## 2011-01-23 NOTE — Discharge Summary (Signed)
[facility]  Patient:    [name]                        MRN: [id number] Adm. Date:  [id number] Disc. Date: [id number] Attending:  Leonard Schwartz Dictator:   Erin Sons, C.N.M.                           Discharge Summary  ADMISSION DIAGNOSES:          1. Intrauterine pregnancy at term.                               2. Early labor.  DISCHARGE DIAGNOSES:          1. Meconium stained fluid.                               2. Maternal fever.                               3. Nonreassuring fetal heart rate in second stage.                               4. Neonatal intensive care unit infant.                               5. Mild anemia.                               6. Left groin cellulitis.  PROCEDURE:                    1) Artificial rupture of membranes. 2) Amnioinfusion.  3) Pitocin augmentation.  4) Normal spontaneous vaginal delivery.  5) Repair of second degree midline episiotomy.  HOSPITAL COURSE:              Ms. [name] is an 30 year old, gravida 1, para 0, at 40-2/7 weeks who presented on October 02, 1999, in early labor.  At the time of  admission temperature was 100.1.  Contractions were 2 to 3 minutes.  Cervical examination was 2 to 3 cm, completely effaced.  Membranes were noted to be intact. Fetal heart rate were reassuring.  Pregnancy had been followed at [facility] Gynecology and had been remarkable for:  1) Last menstrual period. 2) Adolescent pregnancy.  Subsequent to admission the patient continued to labor. The fetus was determined to be in a possible OP presentation.  She made slow change. Artificial rupture of membranes was accomplished at approximately 2:30 .m. on October 02, 1999.  There was moderately thick meconium stained fluid noted.  Cervix at that time was 7 to 8 cm, vertex at -3 to -2 station.  Epidural was placed per patient request.  Hypotonic uterine contractions were noted as  the cervix began to demonstrate a slight swollen anterior lip.  Pitocin augmentation was begun. The patient had some sporadic variables.  By 8 p.m. on the evening of October 02, 1999, the cervix was complete.  Temperature was 101.2.  Antibiotics were started and Tylenol suppository was given.  Amnioinfusion was  initiated secondary to moderate meconium stained fluid.  The patient pushed for approximately three hours.  The  vertex descended to a +3 to +4 station.  The patient began to have deep variables. A midline episiotomy was performed and Georgina Peer, M.D. was notified to request his presence at delivery.  The infant was DeLeed on the perineum, but no aspirative material was obtained.  There was a loose nuchal cord and a second loop around the shoulders.  Neonatologist was present for delivery.  A spontaneous vaginal delivery of a female, [name], was accomplished.  Apgars were 2, 3, and 7.  Weight was 7 pounds 3 ounces.  Cord pHs were done.  Both venous and arterial with venous of 6.91 and arterial of 7.36.  Placenta was noted to be meconium stained.  The infant was taken to NICU secondary to poor tone and to rule out meconium aspiration.  The episiotomy was repaired in the usual fashion.  By  postpartum day #1, the patient was doing well physically.  The baby was in NICU on the ventilator, but was improving.  Was extubated soon after that.  The patient had elected bottlefeed and planned OCPs for contraception.  Hemoglobin on postpartum day #1 was 9.6 down from 12.6.  Later that day, the patient began to note pain nd drainage from her left groin area.  A small amount of discharge from a boil-type area was noted.  Cultures were done, both anaerobic and aerobic, documented positive and gram-positive cocci.  This area was evaluated on the morning of October 05, 1999, by Erin Sons, C.N.M. and Janine Limbo, M.D.  This area appeared to be an area of  localized cellulitis with a small sinus underneath the skin.  There was a small amount of brown serosanguineous fluid noted to be draining without any foul odor.  The patient was afebrile.  Her WBC count was within normal limits.  Consultation was held with Janine Limbo, M.D. and the decision was made to place her on Keflex p.o. dosing.  The infant was continuing to improve nd was anticipated to have approximately three to four-day additional stay in NICU. Lochia was within normal limits, fundus was firm.  The patient was deemed to have received the full benefit of her hospital stay and was discharged home.  DISCHARGE INSTRUCTIONS:       Per [facility]necology handout.  DISCHARGE MEDICATIONS:        1. Motrin 600 mg p.o. q.6h. p.r.n. pain.                               2. Tylox one to two p.o. q.3-4h. p.r.n. pain.                               3. Ortho-Tricyclen one p.o. q.d.                               4. Keflex 250 mg q.i.d. x 7 days.  FOLLOW-UP:                    Discharge follow-up will occur in six weeks at [facility] Gyne[facility]. DD:  10/05/99 TD:  10/06/99 Job: [id number]

## 2011-01-30 ENCOUNTER — Emergency Department (HOSPITAL_COMMUNITY)
Admission: EM | Admit: 2011-01-30 | Discharge: 2011-01-30 | Disposition: A | Discharge status: Home / Self Care | Payer: Medicaid Other | Attending: Emergency Medicine | Admitting: Emergency Medicine

## 2011-01-30 DIAGNOSIS — L2989 Other pruritus: Secondary | ICD-10-CM | POA: Insufficient documentation

## 2011-01-30 DIAGNOSIS — R82998 Other abnormal findings in urine: Secondary | ICD-10-CM | POA: Insufficient documentation

## 2011-01-30 DIAGNOSIS — I10 Essential (primary) hypertension: Secondary | ICD-10-CM | POA: Insufficient documentation

## 2011-01-30 DIAGNOSIS — R195 Other fecal abnormalities: Secondary | ICD-10-CM | POA: Insufficient documentation

## 2011-01-30 DIAGNOSIS — R5383 Other fatigue: Secondary | ICD-10-CM | POA: Insufficient documentation

## 2011-01-30 DIAGNOSIS — L298 Other pruritus: Secondary | ICD-10-CM | POA: Insufficient documentation

## 2011-01-30 DIAGNOSIS — I498 Other specified cardiac arrhythmias: Secondary | ICD-10-CM | POA: Insufficient documentation

## 2011-01-30 DIAGNOSIS — R5381 Other malaise: Secondary | ICD-10-CM | POA: Insufficient documentation

## 2011-01-30 LAB — DIFFERENTIAL
Basophils Relative: 0 % (ref 0–1)
Eosinophils Absolute: 0.1 10*3/uL (ref 0.0–0.7)
Monocytes Absolute: 0.7 10*3/uL (ref 0.1–1.0)
Monocytes Relative: 14 % — ABNORMAL HIGH (ref 3–12)
Neutrophils Relative %: 33 % — ABNORMAL LOW (ref 43–77)

## 2011-01-30 LAB — POCT PREGNANCY, URINE: Preg Test, Ur: NEGATIVE

## 2011-01-30 LAB — CBC
HCT: 39.1 % (ref 36.0–46.0)
Hemoglobin: 14 g/dL (ref 12.0–15.0)
MCV: 86.5 fL (ref 78.0–100.0)
Platelets: 279 10*3/uL (ref 150–400)
RBC: 4.52 MIL/uL (ref 3.87–5.11)
WBC: 4.8 10*3/uL (ref 4.0–10.5)

## 2011-01-30 LAB — POCT I-STAT, CHEM 8
Chloride: 104 mEq/L (ref 96–112)
Glucose, Bld: 80 mg/dL (ref 70–99)
HCT: 41 % (ref 36.0–46.0)
Potassium: 4.3 mEq/L (ref 3.5–5.1)

## 2011-01-30 LAB — URINALYSIS, ROUTINE W REFLEX MICROSCOPIC
Glucose, UA: NEGATIVE mg/dL
Nitrite: NEGATIVE
Specific Gravity, Urine: 1.028 (ref 1.005–1.030)
pH: 5 (ref 5.0–8.0)

## 2011-01-30 LAB — T4, FREE: Free T4: 4.96 ng/dL — ABNORMAL HIGH (ref 0.80–1.80)

## 2011-02-18 ENCOUNTER — Emergency Department (HOSPITAL_COMMUNITY)
Admission: EM | Admit: 2011-02-18 | Discharge: 2011-02-19 | Disposition: A | Discharge status: Home / Self Care | Payer: Medicaid Other | Attending: Emergency Medicine | Admitting: Emergency Medicine

## 2011-02-18 DIAGNOSIS — Z79899 Other long term (current) drug therapy: Secondary | ICD-10-CM | POA: Insufficient documentation

## 2011-02-18 DIAGNOSIS — J3489 Other specified disorders of nose and nasal sinuses: Secondary | ICD-10-CM | POA: Insufficient documentation

## 2011-02-18 DIAGNOSIS — J069 Acute upper respiratory infection, unspecified: Secondary | ICD-10-CM | POA: Insufficient documentation

## 2011-02-18 DIAGNOSIS — R599 Enlarged lymph nodes, unspecified: Secondary | ICD-10-CM | POA: Insufficient documentation

## 2011-02-18 DIAGNOSIS — E669 Obesity, unspecified: Secondary | ICD-10-CM | POA: Insufficient documentation

## 2011-02-18 DIAGNOSIS — R000 Tachycardia, unspecified: Secondary | ICD-10-CM | POA: Insufficient documentation

## 2011-02-18 DIAGNOSIS — I10 Essential (primary) hypertension: Secondary | ICD-10-CM | POA: Insufficient documentation

## 2011-02-18 DIAGNOSIS — J029 Acute pharyngitis, unspecified: Secondary | ICD-10-CM | POA: Insufficient documentation

## 2011-02-18 DIAGNOSIS — H9209 Otalgia, unspecified ear: Secondary | ICD-10-CM | POA: Insufficient documentation

## 2011-02-19 LAB — RAPID STREP SCREEN (MED CTR MEBANE ONLY): Streptococcus, Group A Screen (Direct): NEGATIVE

## 2011-02-22 ENCOUNTER — Emergency Department (HOSPITAL_COMMUNITY)
Admission: EM | Admit: 2011-02-22 | Discharge: 2011-02-22 | Disposition: A | Discharge status: Home / Self Care | Payer: Medicaid Other | Attending: Emergency Medicine | Admitting: Emergency Medicine

## 2011-02-22 DIAGNOSIS — R110 Nausea: Secondary | ICD-10-CM | POA: Insufficient documentation

## 2011-02-22 DIAGNOSIS — I10 Essential (primary) hypertension: Secondary | ICD-10-CM | POA: Insufficient documentation

## 2011-02-22 DIAGNOSIS — J029 Acute pharyngitis, unspecified: Secondary | ICD-10-CM | POA: Insufficient documentation

## 2011-02-22 DIAGNOSIS — H6690 Otitis media, unspecified, unspecified ear: Secondary | ICD-10-CM | POA: Insufficient documentation

## 2011-02-22 DIAGNOSIS — IMO0001 Reserved for inherently not codable concepts without codable children: Secondary | ICD-10-CM | POA: Insufficient documentation

## 2011-02-25 ENCOUNTER — Emergency Department (HOSPITAL_COMMUNITY): Payer: Medicaid Other

## 2011-02-25 ENCOUNTER — Emergency Department (HOSPITAL_COMMUNITY)
Admission: EM | Admit: 2011-02-25 | Discharge: 2011-02-25 | Disposition: A | Discharge status: Home / Self Care | Payer: Medicaid Other | Attending: Emergency Medicine | Admitting: Emergency Medicine

## 2011-02-25 DIAGNOSIS — R112 Nausea with vomiting, unspecified: Secondary | ICD-10-CM | POA: Insufficient documentation

## 2011-02-25 DIAGNOSIS — K219 Gastro-esophageal reflux disease without esophagitis: Secondary | ICD-10-CM | POA: Insufficient documentation

## 2011-02-25 DIAGNOSIS — R1011 Right upper quadrant pain: Secondary | ICD-10-CM | POA: Insufficient documentation

## 2011-02-25 DIAGNOSIS — I10 Essential (primary) hypertension: Secondary | ICD-10-CM | POA: Insufficient documentation

## 2011-02-25 LAB — COMPREHENSIVE METABOLIC PANEL
ALT: 157 U/L — ABNORMAL HIGH (ref 0–35)
AST: 234 U/L — ABNORMAL HIGH (ref 0–37)
CO2: 26 mEq/L (ref 19–32)
Chloride: 103 mEq/L (ref 96–112)
Potassium: 3.9 mEq/L (ref 3.5–5.1)
Sodium: 138 mEq/L (ref 135–145)
Total Bilirubin: 0.9 mg/dL (ref 0.3–1.2)

## 2011-02-25 LAB — URINALYSIS, ROUTINE W REFLEX MICROSCOPIC
Bilirubin Urine: NEGATIVE
Hgb urine dipstick: NEGATIVE
Protein, ur: NEGATIVE mg/dL
Urobilinogen, UA: 1 mg/dL (ref 0.0–1.0)

## 2011-02-25 LAB — URINE MICROSCOPIC-ADD ON

## 2011-02-25 LAB — DIFFERENTIAL
Basophils Absolute: 0 10*3/uL (ref 0.0–0.1)
Eosinophils Absolute: 0.1 10*3/uL (ref 0.0–0.7)
Lymphs Abs: 2.2 10*3/uL (ref 0.7–4.0)
Neutro Abs: 2.4 10*3/uL (ref 1.7–7.7)

## 2011-02-25 LAB — CBC
HCT: 37.3 % (ref 36.0–46.0)
Hemoglobin: 12.7 g/dL (ref 12.0–15.0)
MCV: 85 fL (ref 78.0–100.0)
RDW: 11.6 % (ref 11.5–15.5)
WBC: 5.3 10*3/uL (ref 4.0–10.5)

## 2011-02-25 LAB — HEPATIC FUNCTION PANEL
ALT: 156 U/L — ABNORMAL HIGH (ref 0–35)
Albumin: 3.4 g/dL — ABNORMAL LOW (ref 3.5–5.2)
Alkaline Phosphatase: 94 U/L (ref 39–117)
Total Bilirubin: 0.9 mg/dL (ref 0.3–1.2)

## 2011-02-25 LAB — TSH: TSH: <0.008 u[IU]/mL — ABNORMAL LOW (ref 0.350–4.500)

## 2011-02-27 ENCOUNTER — Ambulatory Visit (HOSPITAL_BASED_OUTPATIENT_CLINIC_OR_DEPARTMENT_OTHER): Payer: Medicaid Other | Admitting: Internal Medicine

## 2011-02-27 ENCOUNTER — Other Ambulatory Visit: Payer: Self-pay | Admitting: Internal Medicine

## 2011-02-27 ENCOUNTER — Encounter (HOSPITAL_BASED_OUTPATIENT_CLINIC_OR_DEPARTMENT_OTHER): Payer: Medicaid Other | Admitting: Hematology & Oncology

## 2011-02-27 ENCOUNTER — Other Ambulatory Visit: Payer: Self-pay | Admitting: Hematology & Oncology

## 2011-02-27 DIAGNOSIS — R748 Abnormal levels of other serum enzymes: Secondary | ICD-10-CM

## 2011-02-27 DIAGNOSIS — R945 Abnormal results of liver function studies: Secondary | ICD-10-CM

## 2011-02-27 DIAGNOSIS — C228 Malignant neoplasm of liver, primary, unspecified as to type: Secondary | ICD-10-CM

## 2011-02-27 LAB — CBC WITH DIFFERENTIAL (CANCER CENTER ONLY)
BASO#: 0 10*3/uL (ref 0.0–0.2)
BASO%: 0.4 % (ref 0.0–2.0)
EOS%: 2.5 % (ref 0.0–7.0)
HCT: 36.6 % (ref 34.8–46.6)
LYMPH#: 3.3 10*3/uL (ref 0.9–3.3)
LYMPH%: 47 % (ref 14.0–48.0)
MCH: 29.9 pg (ref 26.0–34.0)
MCHC: 36.1 g/dL — ABNORMAL HIGH (ref 32.0–36.0)
MCV: 83 fL (ref 81–101)
MONO%: 10.5 % (ref 0.0–13.0)
NEUT%: 39.6 % (ref 39.6–80.0)
RDW: 11.9 % (ref 11.1–15.7)

## 2011-03-02 LAB — COMPREHENSIVE METABOLIC PANEL
ALT: 86 U/L — ABNORMAL HIGH (ref 0–35)
AST: 39 U/L — ABNORMAL HIGH (ref 0–37)
Albumin: 3.8 g/dL (ref 3.5–5.2)
Alkaline Phosphatase: 91 U/L (ref 39–117)
BUN: 8 mg/dL (ref 6–23)
Chloride: 102 mEq/L (ref 96–112)
Potassium: 4.2 mEq/L (ref 3.5–5.3)
Sodium: 140 mEq/L (ref 135–145)
Total Protein: 6.5 g/dL (ref 6.0–8.3)

## 2011-03-02 LAB — HEPATITIS PANEL, ACUTE
Hep A IgM: NEGATIVE
Hep B C IgM: NEGATIVE
Hepatitis B Surface Ag: NEGATIVE

## 2011-03-02 LAB — HIV ANTIBODY (ROUTINE TESTING W REFLEX): HIV: NONREACTIVE

## 2011-03-04 ENCOUNTER — Ambulatory Visit: Payer: Medicaid Other | Admitting: Hematology & Oncology

## 2011-03-06 ENCOUNTER — Inpatient Hospital Stay (HOSPITAL_BASED_OUTPATIENT_CLINIC_OR_DEPARTMENT_OTHER): Admission: RE | Admit: 2011-03-06 | Payer: Medicaid Other | Source: Ambulatory Visit

## 2011-03-09 ENCOUNTER — Ambulatory Visit (HOSPITAL_BASED_OUTPATIENT_CLINIC_OR_DEPARTMENT_OTHER)
Admission: RE | Admit: 2011-03-09 | Discharge: 2011-03-09 | Disposition: A | Discharge status: Home / Self Care | Payer: Medicaid Other | Source: Ambulatory Visit | Attending: Internal Medicine | Admitting: Internal Medicine

## 2011-03-09 DIAGNOSIS — R110 Nausea: Secondary | ICD-10-CM | POA: Insufficient documentation

## 2011-03-09 DIAGNOSIS — R748 Abnormal levels of other serum enzymes: Secondary | ICD-10-CM

## 2011-03-09 DIAGNOSIS — K429 Umbilical hernia without obstruction or gangrene: Secondary | ICD-10-CM

## 2011-03-09 DIAGNOSIS — R945 Abnormal results of liver function studies: Secondary | ICD-10-CM | POA: Insufficient documentation

## 2011-03-09 MED ORDER — IOHEXOL 300 MG/ML  SOLN: 100.0000 mL | Freq: Once | INTRAMUSCULAR | Status: AC | PRN

## 2011-05-05 ENCOUNTER — Emergency Department (HOSPITAL_COMMUNITY): Payer: Medicaid Other

## 2011-05-05 ENCOUNTER — Inpatient Hospital Stay (HOSPITAL_COMMUNITY)
Admission: EM | Admit: 2011-05-05 | Discharge: 2011-05-08 | DRG: 176 | Disposition: A | Discharge status: Home / Self Care | Payer: Medicaid Other | Attending: Internal Medicine | Admitting: Internal Medicine

## 2011-05-05 DIAGNOSIS — F411 Generalized anxiety disorder: Secondary | ICD-10-CM | POA: Diagnosis present

## 2011-05-05 DIAGNOSIS — R1011 Right upper quadrant pain: Secondary | ICD-10-CM | POA: Diagnosis present

## 2011-05-05 DIAGNOSIS — R112 Nausea with vomiting, unspecified: Secondary | ICD-10-CM | POA: Diagnosis present

## 2011-05-05 DIAGNOSIS — I2699 Other pulmonary embolism without acute cor pulmonale: Secondary | ICD-10-CM

## 2011-05-05 DIAGNOSIS — E0590 Thyrotoxicosis, unspecified without thyrotoxic crisis or storm: Secondary | ICD-10-CM | POA: Diagnosis present

## 2011-05-05 DIAGNOSIS — D500 Iron deficiency anemia secondary to blood loss (chronic): Secondary | ICD-10-CM | POA: Diagnosis present

## 2011-05-05 LAB — CBC
MCH: 27.9 pg (ref 26.0–34.0)
MCHC: 34.8 g/dL (ref 30.0–36.0)
Platelets: 316 10*3/uL (ref 150–400)
RDW: 13.5 % (ref 11.5–15.5)

## 2011-05-05 LAB — PROTIME-INR
INR: 1.15 (ref 0.00–1.49)
Prothrombin Time: 14.9 seconds (ref 11.6–15.2)

## 2011-05-05 LAB — CARDIAC PANEL(CRET KIN+CKTOT+MB+TROPI)
Relative Index: INVALID (ref 0.0–2.5)
Relative Index: INVALID (ref 0.0–2.5)
Total CK: 54 U/L (ref 7–177)

## 2011-05-05 LAB — URINALYSIS, ROUTINE W REFLEX MICROSCOPIC
Bilirubin Urine: NEGATIVE
Hgb urine dipstick: NEGATIVE
Nitrite: NEGATIVE
Protein, ur: NEGATIVE mg/dL
Specific Gravity, Urine: 1.023 (ref 1.005–1.030)
Urobilinogen, UA: 0.2 mg/dL (ref 0.0–1.0)

## 2011-05-05 LAB — DIFFERENTIAL
Basophils Absolute: 0 10*3/uL (ref 0.0–0.1)
Basophils Relative: 0 % (ref 0–1)
Eosinophils Absolute: 0.2 10*3/uL (ref 0.0–0.7)
Monocytes Absolute: 0.8 10*3/uL (ref 0.1–1.0)
Monocytes Relative: 10 % (ref 3–12)
Neutro Abs: 5 10*3/uL (ref 1.7–7.7)

## 2011-05-05 LAB — PREGNANCY, URINE: Preg Test, Ur: NEGATIVE

## 2011-05-05 LAB — COMPREHENSIVE METABOLIC PANEL
ALT: 40 U/L — ABNORMAL HIGH (ref 0–35)
ALT: 34 U/L (ref 0–35)
AST: 35 U/L (ref 0–37)
AST: 28 U/L (ref 0–37)
Albumin: 3.3 g/dL — ABNORMAL LOW (ref 3.5–5.2)
Alkaline Phosphatase: 92 U/L (ref 39–117)
CO2: 24 mEq/L (ref 19–32)
Calcium: 10.3 mg/dL (ref 8.4–10.5)
Chloride: 106 mEq/L (ref 96–112)
Creatinine, Ser: <0.47 mg/dL — ABNORMAL LOW (ref 0.50–1.10)
Glucose, Bld: 112 mg/dL — ABNORMAL HIGH (ref 70–99)
Potassium: 3.7 mEq/L (ref 3.5–5.1)
Sodium: 136 mEq/L (ref 135–145)
Total Bilirubin: 0.7 mg/dL (ref 0.3–1.2)
Total Protein: 6.6 g/dL (ref 6.0–8.3)

## 2011-05-05 LAB — TSH: TSH: 0.01 u[IU]/mL — ABNORMAL LOW (ref 0.350–4.500)

## 2011-05-05 LAB — PHOSPHORUS: Phosphorus: 4.1 mg/dL (ref 2.3–4.6)

## 2011-05-05 LAB — PRO B NATRIURETIC PEPTIDE: Pro B Natriuretic peptide (BNP): 900.4 pg/mL — ABNORMAL HIGH (ref 0–125)

## 2011-05-05 MED ORDER — IOHEXOL 300 MG/ML  SOLN
100.0000 mL | Freq: Once | INTRAMUSCULAR | Status: AC | PRN
  Administered 2011-05-05: 100 mL via INTRAVENOUS

## 2011-05-06 DIAGNOSIS — I369 Nonrheumatic tricuspid valve disorder, unspecified: Secondary | ICD-10-CM

## 2011-05-06 LAB — CBC
MCH: 28 pg (ref 26.0–34.0)
MCHC: 34.4 g/dL (ref 30.0–36.0)
Platelets: 265 10*3/uL (ref 150–400)

## 2011-05-06 LAB — CARDIAC PANEL(CRET KIN+CKTOT+MB+TROPI)
CK, MB: 2.5 ng/mL (ref 0.3–4.0)
Relative Index: INVALID (ref 0.0–2.5)
Troponin I: <0.3 ng/mL (ref <0.30)

## 2011-05-06 LAB — PROTIME-INR: Prothrombin Time: 15.9 seconds — ABNORMAL HIGH (ref 11.6–15.2)

## 2011-05-12 NOTE — Discharge Summary (Signed)
[name], [name] NO.:  [id number]  MEDICAL RECORD NO.:  [id number]  LOCATION:  1423                         FACILITY:  [facility]  PHYSICIAN:  Hillery Aldo, M.D.   DATE OF BIRTH:  [birth year]  DATE OF ADMISSION:  05/05/2011 DATE OF DISCHARGE:  05/08/2011                              DISCHARGE SUMMARY   PRIMARY CARE PHYSICIAN:  Tracey Harries, MD  DISCHARGE DIAGNOSES: 1. Multiple pulmonary emboli in the segmental/subsegmental left lower     lobe pulmonary arteries. 2. Hyperthyroidism. 3. Tachycardia secondary to #1 and #2. 4. Mild normocytic anemia. 5. Anxiety.  DISCHARGE MEDICATIONS: 1. Lovenox 80 mg subcutaneously b.i.d. until directed to discontinue     by Dr. Everlene Other. 2. Metoprolol 25 mg p.o. b.i.d. 3. Warfarin 10 mg p.o. daily or as directed by Dr. Everlene Other based on     PT/INR values. 4. Advil 200 mg 1 to 2 tablets p.o. q.8 h p.r.n. pain. 5. Doxepin 25 mg p.o. daily p.r.n. severe itch.  CONSULTATIONS:  None.  BRIEF ADMISSION HPI:  The patient is a 30 year old female who presented to the hospital with a chief complaint of nausea, vomiting, and right upper quadrant abdominal pain..  Because she also had some pain radiating into the chest, a screening D-dimer was checked and found to be elevated and ultimately she underwent a CT scan of the chest which confirmed pulmonary embolism.  She subsequently was referred to the hospitalist service for further evaluation and treatment.  For full details, please see the dictated report done by Dr. Blake Divine.  PROCEDURES AND DIAGNOSTIC STUDIES: 1. Abdominal ultrasound on May 05, 2011 was negative. 2. Chest x-ray on May 05, 2011, showed normal chest. 3. CT angiogram of the chest on May 05, 2011, showed multiple     pulmonary emboli in the segmental/subsegmental left lower lobe     pulmonary arteries.  Overall clot burden small to moderate. 4. Lower-extremity Dopplers on May 05, 2011, showed no evidence  of     DVT or superficial thrombosis involving the right lower extremity     and left lower extremity.  No evidence of Baker's cyst on the right     or left. 5. Two-dimensional echocardiogram done on May 06, 2011, showed no     evidence of right ventricular strain.  Systolic function was normal     with an estimated ejection fraction of 55% to 65% with no regional     wall motion abnormality.  DISCHARGE LABORATORY VALUES:  PT/INR was 18.1/1.47.  TSH was 0.010 with a free T4 of 4.92.  White blood cell count was 6.8, hemoglobin 11.4, hematocrit 33.1, platelets 265.  Sodium was 138, potassium 3.6, chloride 106, bicarb 24, BUN 5, creatinine 0.47, glucose 112.  Total bilirubin was 0.7, alkaline phosphatase 89, AST 28, ALT 34, total protein 5.5, albumin 2.8, calcium 9.5.  HOSPITAL COURSE: 1. Left-sided multiple pulmonary emboli:  The patient was admitted and     put on empiric Lovenox and Coumadin.  Because therapeutic     anticoagulation had been started before blood tests could be drawn     for a hypercoagulability panel but she has been advised that she  should return to Dr. Everlene Other for this to be drawn after she     completes her 3 to 6 months' course of therapeutic anticoagulation.     A two-dimensional echocardiogram was ordered by the admitting     physician to rule out right ventricular strain and this was     negative.  Additionally, lower extremity Dopplers were done which     did not show any evidence of residual clot. 2. Hyperthyroidism:  The patient had a screening TSH done which was     abnormal.  This was followed up with free T4 studies which showed     hyperthyroidism.  The patient will need an outpatient radioactive     iodine uptake scan to determine the underlying cause of her     hyperthyroidism as an outpatient.  This could not be done in-house     secondary to her having received contrast with her CT angiogram and     the radiology department does recommend  waiting 6 weeks before     pursuing this study.  I spoke with Dr. Bonney Leitz nurse and left a     voicemail for her regarding the importance of the need for this to     be scheduled as an outpatient in 6 weeks. 3. Tachycardia:  Felt to be secondary to pulmonary embolism in the     setting of hyperthyroidism.  The patient was put on beta-blockade     therapy, which she will continue on as an outpatient. 4. Mild normocytic anemia:  This is most likely due to menstrual     causes in this 30 year old female.  No further diagnostic     evaluation was undertaken.  Her hemoglobin remained stable. 5. Anxiety:  Likely precipitated and exacerbated by hyperthyroidism.     The patient could follow up with Dr. Everlene Other and, if needed, can     start on anxiolytic therapy.  DISPOSITION:  The patient is medically stable and will be discharged home.  She has been taught how to self-inject Lovenox by the nursing staff and is comfortable doing so.  DISCHARGE INSTRUCTIONS:  Activity:  Increase activity slowly as tolerated.  May shower/bathe.  May lift as tolerated.  Diet:  No restrictions.  Followup:  With Dr. Everlene Other on May 18, 2011 at 10:15.  Follow up with the lab at Dr. Bonney Leitz office for a repeat PT/INR check on May 12, 2011, at 1 p.m.  Recommendations for follow-up:  Follow up radioactive iodine uptake exam as an outpatient in 6 weeks.  Dr. Bonney Leitz office to schedule.  Follow up with PT/INR checks until INR therapeutic x48 hours then discontinue Lovenox.  Follow up with Dr. Everlene Other for reevaluation of anxiety if this continues to be problematic.  CONDITION ON DISCHARGE:  Stable.  Time spent coordinating care for discharge and discharge instructions including face-to-face time, equals approximately 35 minutes.     Hillery Aldo, M.D.     CR/MEDQ  D:  05/08/2011  T:  05/08/2011  Job:  [id number]  cc:   Tracey Harries, M.D. Fax: [phone]  Electronically Signed by Hillery Aldo  M.D. on 05/12/2011 05:22:00 PM

## 2011-05-12 NOTE — H&P (Signed)
[name], BORRAS NO.:  [id number]  MEDICAL RECORD NO.:  [id number]  LOCATION:  1423                         FACILITY:  [facility]  PHYSICIAN:  Kathlen Mody, MD       DATE OF BIRTH:  1981/03/20  DATE OF ADMISSION:  05/05/2011 DATE OF DISCHARGE:                             HISTORY & PHYSICAL   PRIMARY CARE PHYSICIAN:  Despina Hick, MD  CHIEF COMPLAINT:  Right upper quadrant pain associated with nausea, vomiting this morning.  HISTORY OF PRESENT ILLNESS:  This is a 30 year old lady with history of hypertension, not on any medication, came in with right quadrant abdominal pain associated with nausea and vomiting.  The patient also has worsening lower extremity pain and edema since 1 week.  At 4 a.m. this morning, the patient woke up with severe pain in the right upper quadrant and associated with nausea, vomiting, loose bowel movements. Came to the ER.  In the ER, the patient had an ultrasound of the abdomen, which was negative for any acute abnormalities.  She also had a D-dimer, which was elevated followed by a CT angiogram which showed multiple hue in the left lower lobe pulmonary area and she is being admitted to hospitalist service for anticoagulation and further management.  The patient did complain of left-sided chest pain later in the day, sharp, nonradiating, spontaneously resolved itself, associated with some shortness of breath.  No orthopnea or PND.  No history of syncope.  No history of fever, chills, or cough.  No family history of PE.  Denies any long distance travel recently.  The patient denies any urinary symptoms.  Denies any headache, blurry vision.  The patient denies any tingling, numbness, or any focal weakness anywhere in her body.  REVIEW OF SYSTEMS:  See HPI, otherwise negative.  PAST MEDICAL HISTORY:  History of hypertension, not on medication.  PAST SURGICAL HISTORY: 1. Multiple surgeries with removal of sweat glands in her  armpits. 2. Removal of cyst from the right breast. 3. History of removal of pilonidal cyst. 4. Infection. 5. Tubal ligation.  ALLERGIES:  Allergic to PENICILLIN.  MEDICATIONS:  Home medications: 1. Advil. 2. Doxepin  SOCIAL HISTORY:  The patient denies any smoking, ETOH, or recreational drug use.  She has 6 children.  FAMILY HISTORY:  Thyroid problems and high blood pressure  PHYSICAL EXAMINATION:  VITAL SIGNS:  She is afebrile, blood pressure of 120/70, pulse of 109 ranging from 90-110, and respirations of 20 per minute, saturating 98% on 1 L oxygen. GENERAL:  She is alert, afebrile, oriented x3, comfortable in no acute distress. HEENT:  Pupils reacting to light and accommodation.  No scleral icterus. NECK:  No JVD. CARDIOVASCULAR:  S1 and S2 heard.  No rubs, murmurs, or gallops. Tachycardic. RESPIRATORY:  Chest clear to auscultation bilaterally.  No wheezing or rhonchi. ABDOMEN:  Soft, nontender, nondistended.  Bowel sounds are heard. EXTREMITIES:  No pedal edema, cyanosis, or clubbing. NEUROLOGICAL EXAM:  Nonfocal.  PERTINENT LABORATORIES:  She had a urinalysis negative for nitrites and leukocytes.  Urine pregnancy test negative.  CBC significant for a hemoglobin of 11.7 and hematocrit of 33.6.  Comprehensive metabolic panel shows slightly elevated ALT,  albumin of 3.3, lipase normal.  D- dimer of 1.27.  INR of 1.15.  RADIOLOGY: 1. The patient had an ultrasound of abdomen, which was negative for     gallstones, gallbladder thickening, or pericholecystic fluid. 2. Two-view chest x-ray showed a normal chest and a CT angiogram     showed multiple PE in the segmental/subsegmental left lower lobe     pulmonary arteries, clot burden appears to be moderate  ASSESSMENT AND PLAN:  30 year old lady with history of hypertension, came in for right upper quadrant pain, shortness of breath, and chest pain, was found to have multiple pulmonary embolism in the left lower lung.  She  is admitted to tele, she was started on anticoagulation with Lovenox.  Coumadin will be started later tonight.  We will get a 2-D echocardiogram.  There is a clot burden, appears to be in moderate.  We will also get a venous duplex of bilateral lower extremities to rule out DVT.  We will get 2 sets of cardiac enzymes.  Further recommendations as per the clinical course anticoagulation profile was already sent prior to giving Lovenox.  The patient is full code.          ______________________________ Kathlen Mody, MD     VA/MEDQ  D:  05/05/2011  T:  05/05/2011  Job:  [id number]  Electronically Signed by Kathlen Mody MD on 05/12/2011 12:15:56 AM

## 2011-05-27 LAB — RAPID STREP SCREEN (MED CTR MEBANE ONLY): Streptococcus, Group A Screen (Direct): POSITIVE — AB

## 2011-05-28 LAB — CBC
HCT: 35 — ABNORMAL LOW
Hemoglobin: 10.2 — ABNORMAL LOW
Hemoglobin: 12.3
MCHC: 34.4
MCV: 96.4
Platelets: 334
Platelets: 452 — ABNORMAL HIGH
RDW: 12.8
WBC: 16 — ABNORMAL HIGH

## 2011-05-28 LAB — COMPREHENSIVE METABOLIC PANEL
Albumin: 3.3 — ABNORMAL LOW
Alkaline Phosphatase: 118 — ABNORMAL HIGH
BUN: 7
CO2: 20
Chloride: 107
Creatinine, Ser: 0.94
GFR calc non Af Amer: >60
Glucose, Bld: 83
Potassium: 3.9
Total Bilirubin: 1

## 2011-05-28 LAB — URINALYSIS, ROUTINE W REFLEX MICROSCOPIC
Bilirubin Urine: NEGATIVE
Hgb urine dipstick: NEGATIVE
Nitrite: NEGATIVE
Specific Gravity, Urine: <1.005 — ABNORMAL LOW
pH: 7

## 2011-05-28 LAB — CULTURE, BLOOD (ROUTINE X 2): Culture: NO GROWTH

## 2011-05-28 LAB — DIFFERENTIAL
Basophils Absolute: 0
Basophils Absolute: 0
Basophils Relative: 0
Basophils Relative: 0
Eosinophils Relative: 5
Lymphocytes Relative: 19
Lymphocytes Relative: 8 — ABNORMAL LOW
Monocytes Absolute: 0.7
Neutro Abs: 13.5 — ABNORMAL HIGH
Neutro Abs: 8.1 — ABNORMAL HIGH
Neutrophils Relative %: 84 — ABNORMAL HIGH

## 2011-05-28 LAB — URINE CULTURE: Culture: NO GROWTH

## 2011-05-31 ENCOUNTER — Emergency Department (HOSPITAL_COMMUNITY): Payer: Medicaid Other

## 2011-05-31 ENCOUNTER — Inpatient Hospital Stay (HOSPITAL_COMMUNITY)
Admission: EM | Admit: 2011-05-31 | Discharge: 2011-06-05 | DRG: 175 | Disposition: A | Discharge status: Home Health | Payer: Medicaid Other | Attending: Internal Medicine | Admitting: Internal Medicine

## 2011-05-31 ENCOUNTER — Emergency Department (HOSPITAL_COMMUNITY)
Admission: EM | Admit: 2011-05-31 | Discharge: 2011-05-31 | Disposition: A | Discharge status: Home / Self Care | Payer: Medicaid Other | Source: Home / Self Care | Attending: Emergency Medicine | Admitting: Emergency Medicine

## 2011-05-31 DIAGNOSIS — I2699 Other pulmonary embolism without acute cor pulmonale: Principal | ICD-10-CM | POA: Diagnosis present

## 2011-05-31 DIAGNOSIS — D649 Anemia, unspecified: Secondary | ICD-10-CM | POA: Diagnosis present

## 2011-05-31 DIAGNOSIS — Z8489 Family history of other specified conditions: Secondary | ICD-10-CM

## 2011-05-31 DIAGNOSIS — J189 Pneumonia, unspecified organism: Secondary | ICD-10-CM | POA: Diagnosis present

## 2011-05-31 DIAGNOSIS — F411 Generalized anxiety disorder: Secondary | ICD-10-CM | POA: Diagnosis present

## 2011-05-31 DIAGNOSIS — D682 Hereditary deficiency of other clotting factors: Secondary | ICD-10-CM | POA: Diagnosis present

## 2011-05-31 DIAGNOSIS — R404 Transient alteration of awareness: Secondary | ICD-10-CM | POA: Diagnosis not present

## 2011-05-31 DIAGNOSIS — Z7901 Long term (current) use of anticoagulants: Secondary | ICD-10-CM

## 2011-05-31 DIAGNOSIS — E876 Hypokalemia: Secondary | ICD-10-CM | POA: Diagnosis present

## 2011-05-31 DIAGNOSIS — Z8249 Family history of ischemic heart disease and other diseases of the circulatory system: Secondary | ICD-10-CM

## 2011-05-31 DIAGNOSIS — E0590 Thyrotoxicosis, unspecified without thyrotoxic crisis or storm: Secondary | ICD-10-CM | POA: Diagnosis present

## 2011-05-31 DIAGNOSIS — I10 Essential (primary) hypertension: Secondary | ICD-10-CM | POA: Insufficient documentation

## 2011-05-31 DIAGNOSIS — R059 Cough, unspecified: Secondary | ICD-10-CM | POA: Insufficient documentation

## 2011-05-31 DIAGNOSIS — E0500 Thyrotoxicosis with diffuse goiter without thyrotoxic crisis or storm: Secondary | ICD-10-CM | POA: Diagnosis present

## 2011-05-31 DIAGNOSIS — Z86718 Personal history of other venous thrombosis and embolism: Secondary | ICD-10-CM

## 2011-05-31 DIAGNOSIS — Z880 Allergy status to penicillin: Secondary | ICD-10-CM

## 2011-05-31 DIAGNOSIS — R0602 Shortness of breath: Secondary | ICD-10-CM | POA: Insufficient documentation

## 2011-05-31 DIAGNOSIS — R0789 Other chest pain: Secondary | ICD-10-CM | POA: Insufficient documentation

## 2011-05-31 DIAGNOSIS — R634 Abnormal weight loss: Secondary | ICD-10-CM | POA: Diagnosis present

## 2011-05-31 DIAGNOSIS — Z01812 Encounter for preprocedural laboratory examination: Secondary | ICD-10-CM

## 2011-05-31 DIAGNOSIS — R05 Cough: Secondary | ICD-10-CM | POA: Insufficient documentation

## 2011-05-31 DIAGNOSIS — R000 Tachycardia, unspecified: Secondary | ICD-10-CM | POA: Diagnosis present

## 2011-05-31 DIAGNOSIS — T4275XA Adverse effect of unspecified antiepileptic and sedative-hypnotic drugs, initial encounter: Secondary | ICD-10-CM | POA: Diagnosis not present

## 2011-05-31 LAB — DIFFERENTIAL
Basophils Relative: 0 % (ref 0–1)
Eosinophils Absolute: 0.2 10*3/uL (ref 0.0–0.7)
Neutro Abs: 3.2 10*3/uL (ref 1.7–7.7)
Neutrophils Relative %: 41 % — ABNORMAL LOW (ref 43–77)

## 2011-05-31 LAB — POCT I-STAT, CHEM 8
BUN: <3 mg/dL — ABNORMAL LOW (ref 6–23)
Calcium, Ion: 1.28 mmol/L (ref 1.12–1.32)
Chloride: 103 mEq/L (ref 96–112)
Chloride: 102 mEq/L (ref 96–112)
HCT: 34 % — ABNORMAL LOW (ref 36.0–46.0)
HCT: 38 % (ref 36.0–46.0)
Hemoglobin: 12.9 g/dL (ref 12.0–15.0)
Potassium: 3.3 mEq/L — ABNORMAL LOW (ref 3.5–5.1)
Sodium: 140 mEq/L (ref 135–145)
Sodium: 140 mEq/L (ref 135–145)
TCO2: 22 mmol/L (ref 0–100)

## 2011-05-31 LAB — POCT I-STAT TROPONIN I: Troponin i, poc: 0 ng/mL (ref 0.00–0.08)

## 2011-05-31 LAB — CBC
Hemoglobin: 11.7 g/dL — ABNORMAL LOW (ref 12.0–15.0)
Platelets: 234 10*3/uL (ref 150–400)
RBC: 4.18 MIL/uL (ref 3.87–5.11)
WBC: 7.7 10*3/uL (ref 4.0–10.5)

## 2011-05-31 LAB — POCT PREGNANCY, URINE: Preg Test, Ur: NEGATIVE

## 2011-05-31 LAB — PROTIME-INR
INR: 2.09 — ABNORMAL HIGH (ref 0.00–1.49)
Prothrombin Time: 23.8 seconds — ABNORMAL HIGH (ref 11.6–15.2)

## 2011-05-31 MED ORDER — IOHEXOL 300 MG/ML  SOLN
100.0000 mL | Freq: Once | INTRAMUSCULAR | Status: AC | PRN
  Administered 2011-05-31: 100 mL via INTRAVENOUS

## 2011-06-01 DIAGNOSIS — I8000 Phlebitis and thrombophlebitis of superficial vessels of unspecified lower extremity: Secondary | ICD-10-CM

## 2011-06-01 LAB — COMPREHENSIVE METABOLIC PANEL
AST: 16 U/L (ref 0–37)
Albumin: 2.9 g/dL — ABNORMAL LOW (ref 3.5–5.2)
Alkaline Phosphatase: 90 U/L (ref 39–117)
BUN: 4 mg/dL — ABNORMAL LOW (ref 6–23)
Potassium: 4 mEq/L (ref 3.5–5.1)
Total Protein: 6.2 g/dL (ref 6.0–8.3)

## 2011-06-01 LAB — URINE MICROSCOPIC-ADD ON

## 2011-06-01 LAB — CBC
HCT: 29.8 % — ABNORMAL LOW (ref 36.0–46.0)
MCHC: 34.6 g/dL (ref 30.0–36.0)
Platelets: 250 10*3/uL (ref 150–400)
RDW: 13.5 % (ref 11.5–15.5)

## 2011-06-01 LAB — URINALYSIS, ROUTINE W REFLEX MICROSCOPIC
Nitrite: NEGATIVE
Specific Gravity, Urine: 1.019
Urobilinogen, UA: 0.2
pH: 6

## 2011-06-01 LAB — PROTIME-INR: INR: 3.71 — ABNORMAL HIGH (ref 0.00–1.49)

## 2011-06-01 LAB — FERRITIN: Ferritin: 201 ng/mL (ref 10–291)

## 2011-06-02 ENCOUNTER — Inpatient Hospital Stay (HOSPITAL_COMMUNITY): Payer: Medicaid Other

## 2011-06-02 DIAGNOSIS — R0602 Shortness of breath: Secondary | ICD-10-CM

## 2011-06-02 DIAGNOSIS — I2699 Other pulmonary embolism without acute cor pulmonale: Secondary | ICD-10-CM

## 2011-06-02 DIAGNOSIS — R509 Fever, unspecified: Secondary | ICD-10-CM

## 2011-06-02 DIAGNOSIS — R042 Hemoptysis: Secondary | ICD-10-CM

## 2011-06-02 LAB — COMPREHENSIVE METABOLIC PANEL
AST: 19 U/L (ref 0–37)
Albumin: 3 g/dL — ABNORMAL LOW (ref 3.5–5.2)
Alkaline Phosphatase: 90 U/L (ref 39–117)
BUN: 4 mg/dL — ABNORMAL LOW (ref 6–23)
Chloride: 100 mEq/L (ref 96–112)
Creatinine, Ser: <0.47 mg/dL — ABNORMAL LOW (ref 0.50–1.10)
Potassium: 4 mEq/L (ref 3.5–5.1)
Total Bilirubin: 1 mg/dL (ref 0.3–1.2)
Total Protein: 6.6 g/dL (ref 6.0–8.3)

## 2011-06-02 LAB — LUPUS ANTICOAGULANT PANEL
DRVVT: 75 secs — ABNORMAL HIGH (ref 34.1–42.2)
PTT Lupus Anticoagulant: 75.4 secs — ABNORMAL HIGH (ref 30.0–45.6)
PTTLA Confirmation: 19.9 secs — ABNORMAL HIGH (ref <8.0)
dRVVT Incubated 1:1 Mix: 40.4 secs (ref 34.1–42.2)

## 2011-06-02 LAB — CARDIOLIPIN ANTIBODIES, IGG, IGM, IGA
Anticardiolipin IgA: 9 APL U/mL — ABNORMAL LOW (ref <22)
Anticardiolipin IgG: 8 GPL U/mL — ABNORMAL LOW (ref <23)
Anticardiolipin IgM: 4 MPL U/mL — ABNORMAL LOW (ref <11)

## 2011-06-02 LAB — PROTIME-INR: INR: 1.68 — ABNORMAL HIGH (ref 0.00–1.49)

## 2011-06-02 LAB — URINE MICROSCOPIC-ADD ON

## 2011-06-02 LAB — URINALYSIS, ROUTINE W REFLEX MICROSCOPIC
Bilirubin Urine: NEGATIVE
Glucose, UA: NEGATIVE mg/dL
Specific Gravity, Urine: 1.01 (ref 1.005–1.030)
Urobilinogen, UA: 1 mg/dL (ref 0.0–1.0)

## 2011-06-02 LAB — CBC
HCT: 32.2 % — ABNORMAL LOW (ref 36.0–46.0)
Hemoglobin: 11.2 g/dL — ABNORMAL LOW (ref 12.0–15.0)
WBC: 9.9 10*3/uL (ref 4.0–10.5)

## 2011-06-02 LAB — FACTOR 5 LEIDEN

## 2011-06-02 LAB — PROTEIN S ACTIVITY: Protein S Activity: 25 % — ABNORMAL LOW (ref 69–129)

## 2011-06-02 LAB — BETA-2-GLYCOPROTEIN I ABS, IGG/M/A: Beta-2-Glycoprotein I IgM: 1 M Units (ref <20)

## 2011-06-03 LAB — BASIC METABOLIC PANEL
Calcium: 10.1 mg/dL (ref 8.4–10.5)
Glucose, Bld: 83 mg/dL (ref 70–99)
Potassium: 4.3 mEq/L (ref 3.5–5.1)
Sodium: 139 mEq/L (ref 135–145)

## 2011-06-03 LAB — CBC
MCV: 80.6 fL (ref 78.0–100.0)
Platelets: 288 10*3/uL (ref 150–400)
RBC: 4.23 MIL/uL (ref 3.87–5.11)
RDW: 12.7 % (ref 11.5–15.5)
WBC: 7.7 10*3/uL (ref 4.0–10.5)

## 2011-06-03 LAB — PROTIME-INR
INR: 1.33 (ref 0.00–1.49)
Prothrombin Time: 16.7 seconds — ABNORMAL HIGH (ref 11.6–15.2)

## 2011-06-03 LAB — PROTEIN S, TOTAL: Protein S Ag, Total: 56 % — ABNORMAL LOW (ref 60–150)

## 2011-06-04 LAB — LEGIONELLA ANTIGEN, URINE: Legionella Antigen, Urine: NEGATIVE

## 2011-06-04 LAB — DIFFERENTIAL
Basophils Absolute: 0 10*3/uL (ref 0.0–0.1)
Eosinophils Absolute: 0.2 10*3/uL (ref 0.0–0.7)
Eosinophils Relative: 3 % (ref 0–5)
Lymphocytes Relative: 48 % — ABNORMAL HIGH (ref 12–46)
Lymphs Abs: 3.3 10*3/uL (ref 0.7–4.0)
Neutrophils Relative %: 39 % — ABNORMAL LOW (ref 43–77)

## 2011-06-04 LAB — PROTIME-INR
INR: 1.19 (ref 0.00–1.49)
Prothrombin Time: 15.4 seconds — ABNORMAL HIGH (ref 11.6–15.2)

## 2011-06-04 LAB — CBC
HCT: 33.9 % — ABNORMAL LOW (ref 36.0–46.0)
MCV: 79.8 fL (ref 78.0–100.0)
Platelets: 318 10*3/uL (ref 150–400)
RBC: 4.25 MIL/uL (ref 3.87–5.11)
RDW: 12.7 % (ref 11.5–15.5)
WBC: 6.8 10*3/uL (ref 4.0–10.5)

## 2011-06-04 NOTE — H&P (Signed)
[name], [name] NO.:  [id number]  MEDICAL RECORD NO.:  [id number]  LOCATION:  [facility]                         FACILITY:  [facility]  PHYSICIAN:  Elliot Cousin, M.D.    DATE OF BIRTH:  [birth year]  DATE OF ADMISSION:  05/31/2011 DATE OF DISCHARGE:                             HISTORY & PHYSICAL   PRIMARY CARE PHYSICIAN:  Tracey Harries, MD  CHIEF COMPLAINT:  Pleuritic chest pain.  HISTORY OF PRESENT ILLNESS:  The patient is a 30 year old woman with a past medical history significant for multiple pulmonary emboli in the left lung diagnosed in August 2012, newly diagnosed hyperthyroidism, and anemia who presented to the emergency department today with a chief complaint of worsening left-sided pleuritic chest pain.  She actually presented to [facility] Emergency Department this morning for the same. Blood work was done.  She was discharged back to home.  The pain became so intense and so sharp that she presented to [facility] Emergency Department later today.  She describes the chest pain as sharp and it worsens with a deep inspiration.  The pain is 10/10 in intensity. It has been constant for the past 24 hours.  She has had a productive cough with yellow sputum.  She has had one episode of blood-tinged sputum.  She has had subjective fever and chills.  She has had nausea, but no vomiting.  She has had abdominal pain, which is actually pain radiating from her chest to her abdomen.  She has had no diarrhea and no pain with urination.  She has had no swelling in her legs.  In the emergency department, the patient is noted to be tachycardic with a heart rate of 115 beats per minute, however, she is oxygenating 100% on room air.  She is otherwise hemodynamically stable and afebrile.  Her troponin-I is 0.00.  Her EKG reveals sinus tachycardia with a heart rate of 101 beats per minute and left axis deviation.  Her serum potassium is 3.3.  The CT angiogram of her  chest reveals a new right upper lobe segmental pulmonary embolus, resolving left lower lobe pulmonary embolus, and a small left pleural effusion.  Her INR is actually therapeutic at 2.09.  She is being admitted for further evaluation and management.  PAST MEDICAL HISTORY: 1. Left-sided pulmonary emboli, diagnosed in August 2012. 2. Hyperthyroidism, diagnosed in August 2012. 3. Mild normocytic anemia. 4. Chronic anxiety. 5. History of multiple surgeries with removal of sweat glands in her     axilla bilaterally. 6. Status post removal of cyst from the right breast. 7. History of removal of a pilonidal cyst. 8. Status post tubal ligation.  MEDICATIONS: 1. Warfarin 10 mg daily. 2. Metoprolol 25 mg b.i.d.  ALLERGIES:  She has an allergy to PENICILLIN.  SOCIAL HISTORY:  The patient is married.  She has 6 children.  She is unemployed.  She receives disability.  She denies tobacco, alcohol, and illicit drug use.  FAMILY HISTORY:  Her mother is 28 years of age and has a history of hypertension and pulmonary embolism.  Her father is 47 years of age and has a history of prostate cancer, kidney stones, and hypertension.  REVIEW OF SYSTEMS:  As above in history present illness.  In addition, the patient has had a poor appetite and anxiousness.  PHYSICAL EXAMINATION:  VITAL SIGNS:  Temperature 98.2, blood pressure 137/77, pulse 104, respiratory rate 17, oxygen saturation 100% on room air. GENERAL:  The patient is a pleasant overweight 30 year old African American woman, who is currently sitting up in bed in some distress due to pleuritic chest pain. HEENT:  Head is normocephalic, nontraumatic.  Pupils are equal, round, and reactive to light.  Extraocular muscles are intact.  Conjunctivae are clear.  Sclerae are white.  Tympanic membranes not examined. Oropharynx reveals good dentition.  Mucous membranes are mildly dry.  No posterior exudates or erythema. NECK:  Supple.  No adenopathy,  no thyromegaly, no bruit, no JVD. LUNGS:  Decreased breath sounds at the bases.  She is splinting. Occasional wheezes auscultated, but breath sounds overall are diminished. HEART:  S1 and S2 with tachycardia. ABDOMEN:  Mildly obese positive bowel sounds, soft, nontender, nondistended.  No hepatosplenomegaly.  No masses palpated. GU AND RECTAL:  Deferred. EXTREMITIES:  Pedal pulses are palpable bilaterally.  No pretibial edema and no pedal edema. NEUROLOGIC:  The patient is alert and oriented x3.  Cranial nerves II through XII are intact.  Strength is 5/5 throughout.  Sensation is intact. PSYCHOLOGIC:  The patient is pleasant and cooperative, however, she does appear to be anxious.  LABORATORY DATA:  Pregnancy test negative.  Troponin-I 0.00. Bicarbonate 24, ionized calcium 1.31, hemoglobin 12.9, hematocrit 38, sodium 140, potassium 3.3, chloride 102, glucose 111, BUN less than 3, creatinine 0.40, PT 23.8, INR 2.09.  WBC 7.7, hemoglobin 11.7, platelet count 234.  ASSESSMENT: 1. Worsening pleuritic chest pain with a new right lung pulmonary     embolism.  The patient's pain is on the left, although the new     pulmonary embolus is on the right.  She does have a new small left-     sided pleural effusion.  Her INR is therapeutic.  It is possible     that she may need a higher INR, possibly ranging from 2.5-3.5.  It     is likely that she has some type of genetic hypercoagulable     disorder. 2. History of newly diagnosed hyperthyroidism.  Hyperthyroidism was     diagnosed several weeks ago during the August hospitalization.  She     was advised to follow up with her primary care physician for     further evaluation.  However, she received intravenous dye for the     CT angiogram at that time; it was recommended that she wait 6 weeks     before any type of radiographic testing.  The patient has not been     evaluated for this more thoroughly yet.  Today, however, she again      received intravenous dye. 3. Sinus tachycardia, likely secondary to hyperthyroidism and     pulmonary emboli.  She is being treated with     metoprolol 25 mg b.i.d. as started during the last hospitalization.     She does have some anxiousness, which may also be secondary to     hyperthyroidism. 4. Hypokalemia.  PLAN: 1. Pain management. 2. We will continue Coumadin per pharmacy.  We will add Lovenox as a     bridge for a goal INR of 2.5-3.5. 3. We wil order lupus anticoagulant, homocysteine level,     factor V Leiden, and cardiolipin antibodies which may not be  affected by ongoing anticoagulation with Coumadin and Lovenox. 4. Replete potassium chloride.  We will check a magnesium level in the     morning. 5. We will add standing dose t.i.d. Xanax and p.r.n. Xanax for obvious     anxiousness as related to hyperthyroidism.     Elliot Cousin, M.D.     DF/MEDQ  D:  05/31/2011  T:  05/31/2011  Job:  [id number]  cc:   Tracey Harries, M.D.  Electronically Signed by Elliot Cousin M.D. on 06/04/2011 11:36:40 PM

## 2011-06-08 LAB — CULTURE, BLOOD (ROUTINE X 2)
Culture  Setup Time: 201209251558
Culture: NO GROWTH

## 2011-06-08 LAB — URINALYSIS, ROUTINE W REFLEX MICROSCOPIC
Glucose, UA: NEGATIVE
Nitrite: POSITIVE — AB
Specific Gravity, Urine: >1.046 — ABNORMAL HIGH
pH: 5.5

## 2011-06-08 LAB — URINE MICROSCOPIC-ADD ON

## 2011-06-08 LAB — POCT PREGNANCY, URINE: Preg Test, Ur: NEGATIVE

## 2011-06-08 LAB — RAPID STREP SCREEN (MED CTR MEBANE ONLY): Streptococcus, Group A Screen (Direct): NEGATIVE

## 2011-06-08 NOTE — Consult Note (Signed)
[name], [name] NO.:  [id number]  MEDICAL RECORD NO.:  [id number]  LOCATION:  2019                         FACILITY:  [facility]  PHYSICIAN:  Ladene Artist, M.D.  DATE OF BIRTH:  1980-11-21  DATE OF CONSULTATION:  06/01/2011 DATE OF DISCHARGE:                                CONSULTATION   REASON FOR CONSULTATION:  Rule out hypercoagulable state - recurrent PE.  REQUESTING PHYSICIAN:  [facility].  HISTORY OF PRESENT ILLNESS:  [name] is a 30 year old African American female with a recent history of bilateral PE on May 05, 2011, and she had presented to the emergency department with nausea, vomiting, right upper quadrant pain, as well as right pleuritic chest pain.  At that time, Dopplers were negative for DVT.  She was placed on Lovenox 80 mg q.12 hours and remained on Lovenox through her hospitalization.  Coumadin was initiated as well per Pharmacy.  She was discharged on May 08, 2011, with Coumadin 10 mg a day, and Lovenox 80 mg b.i.d.  She received another 8 days of Lovenox, total 16 doses, as an outpatient.  She discontinued the use of Lovenox about 1 week ago.  She was readmitted on May 31, 2011, with pleuritic chest pain for 24 hours, one episode of blood-tinged sputum, subjective fever and chills, as well as nausea.  No vomiting.  Her INR was 2.09, with platelet count of 234. CT angio of Chest on 05/31/11 showed a new RUL segmental PE, with resolving LLL PE and small new left pleural effusion.  The patient at the time of admission was on Coumadin at 10 mg a day.  On admission, she was re-placed on Lovenox at 75 mg b.i.d.  The first dose was received on May 31, 2011, at 1900 hours, followed by a dose at 7 a.m. on June 01, 2011.  She received a loading dose of Coumadin at 12.5 mg x1.  Currently, her Coumadin dose is at 7.5 mg daily, or as directed by Pharmacy.  We were asked to see her in consultation, to rule out the  possibility of a hypercoagulable state, to explain PE recurrence.  Her lower extremity Dopplers today are pending, and so are her hypercoagulable studies.  PAST MEDICAL HISTORY: 1. Hypertension. 2. Hyperthyroidism 3. History of PE as above. 4. Anxiety. 5. Migraine headaches. 6. History of chronic right upper extremity lymphedema. 7. Per chart report on February 24, 2008, the patient may have been     diagnosed with diabetes mellitus, non-insulin-dependent. 8. Mild microcytic anemia. 9. Ejection fraction 55-65%.  SURGERY: 1. Status post excision of hidradenitis bilaterally in the axillary     area by Dr. Lurene Shadow March 25, 2006 and 2008. 2. Status post excision of pilonidal cyst by Dr. Ezzard Standing on March 14, 2009. 3. Status post C-section 2011. 4. Status post bilateral tubal ligation in 2011.  ALLERGIES:  PENICILLIN.  MEDICATIONS:  Xanax, Colace, Lovenox, Lopressor, K-Dur, Coumadin, Tylenol, Ventolin, Sinequan, Dilaudid, Maalox, Zofran, Roxicodone, Phenergan.  REVIEW OF SYSTEMS:  The patient is feeling very debilitated.  She complains of dyspnea on exertion, as well as pleuritic chest pain located on the left.  She  has significant discomfort upon inspiration, which causes her to have poor inspiratory effort.  She reports an episode of mild hemoptysis, and occasional episodes of hematemesis, with green material, as well as bouts of dark blood.  She also complains of subjective fever and chills, no night sweats.  No headaches at this time.  She did have diplopia 3 years ago, but this was corrected with glasses.  No dysphagia.  She also complains of some right upper quadrant pain, decreased appetite, she has lost 60 pounds over the last 6 months, initially intentionally but then as stated by her, the pounds kept dropping off.  She denies any blood in the stools or in the urine.  No gum or nosebleed.  Denies any significant amounts of caffeine, or ice chips.  Her last menstrual period  was on May 17, 2011, she states that her periods are very irregular every 3-4 months.  Denies any history of sickle cell, and she also denies any risk factors or hepatitis or HIV.  FAMILY HISTORY:  Mother alive with a history of blood clots, as well as a history of kidney cancer.  Father died with prostate cancer.  There is no family history of other types of cancer, or other family members with any history of clotting disorders.  SOCIAL HISTORY:  The patient is married.  She has 6 children.  She never had a miscarriage according to her.  The last birth was on February 2011.  Lives in [place].  Full code.  She does not smoke.  No alcohol history or recreational drugs.  PHYSICAL EXAMINATION:  GENERAL:  This is a 30 year old [place] American female, very anxious, in moderate discomfort, ill-appearing, but able to answer questions. VITAL SIGNS:  Blood pressure 139/92, pulse 123, respirations 16, temperature 98.8, O2 sats 98% on 2 liters, weight 72.8 kg, height 61 inches. HEENT:  Normocephalic, atraumatic.  Sclerae anicteric.  Oral cavity without thrush or lesions. NECK:  Supple.  In the left, there is a presence of shotty lymph nodes palpable.  No supraclavicular lymph nodes. LUNGS:  Clear to auscultation.  However, she has poor inspiratory effort due to significant pleuritic pain on the left. CARDIOVASCULAR:  Tachycardic, regular, no rubs, or gallops. ABDOMEN:  Obese, nontender.  Bowel sounds x4.  No hepatosplenomegaly. GU AND RECTAL:  Deferred. EXTREMITIES:  No clubbing or cyanosis.  No edema.  No inguinal masses. SKIN:  Without open areas, bruising or petechial rash. NEURO:  Nonfocal.  LABORATORY DATA:  Hemoglobin 10.6, hematocrit 29.8, white count 7.3, platelets 250, MCV 79.7, ANC 3.2, lymphocytes 3.5, monocytes 0.8, PT 37.3, INR 3.71.  Sodium 141, potassium 4.0, BUN 4, creatinine 0.47, glucose 97.  Total bilirubin 0.6, alkaline phosphatase 90, AST 16, ALT 27, total  protein 6.2, albumin 2.9, calcium 9.7, magnesium 1.8, TSH 0.010, ANC 4.92.  IMPRESSION: 1. Bilateral pulmonary emboli. 2. Pleuritic left chest pain, likely secondary to pulmonary embolism. 3. Tachycardia, possibly secondary to pulmonary embolism, versus     thyroid disease. 4. Hyperthyroidism. 5. Microcytic anemia. 6. Weight loss. 7. History of anxiety. 8. 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  The etiology of the pulmonary emboli is unclear.  Her only apparent risk factor for venous thrombosis is moderate obesity.  Doubt that she has inherited hypercoagulation syndrome, with no thrombosis after 6 pregnancies.  She is young, but we should consider an underlying malignancy, versus thromboembolic disease associated with hyperthyroidism.  RECOMMENDATIONS: 1. Hold Coumadin and continue on Lovenox. 2. Endocrine evaluation for management of hyperthyroidism. 3. Check ferritin levels. 4. Hemoccult  stools. 5. Consider endoscopic evaluation for recurrent GI symptoms. 6. Hematology will continue following with the Medicine Service. 7. Check Dopplers results.  It is not clear that she has failed Coumadin.  However, Dr. Truett Perna, who has seen and evaluated the patient, would continue Lovenox anticoagulation for now.  Thank you very much for allowing [place] the opportunity to participate in the care of Ms. Bon.     Marlowe Kays, PA-C   ______________________________ Ladene Artist, M.D.    SW/MEDQ  D:  06/02/2011  T:  06/02/2011  Job:  [id number]  cc:   Roseanna Rainbow, M.D.  Electronically Signed by Marlowe Kays P.A. on 06/02/2011 03:13:44 PM Electronically Signed by Thornton Papas M.D. on 06/08/2011 08:43:33 AM

## 2011-06-09 LAB — PROTEIN ELECTROPHORESIS, SERUM
Albumin ELP: 49.7 % — ABNORMAL LOW (ref 55.8–66.1)
Alpha-1-Globulin: 6.4 % — ABNORMAL HIGH (ref 2.9–4.9)
Alpha-2-Globulin: 14.6 % — ABNORMAL HIGH (ref 7.1–11.8)
Beta Globulin: 6.7 % (ref 4.7–7.2)
Total Protein ELP: 6.1 g/dL (ref 6.0–8.3)

## 2011-06-12 LAB — URINE MICROSCOPIC-ADD ON

## 2011-06-12 LAB — CBC
HCT: 34.8 — ABNORMAL LOW
Hemoglobin: 11.6 — ABNORMAL LOW
Hemoglobin: 12.4
MCHC: 35.6
MCHC: 35.6
MCV: 94.4
MCV: 95.9
Platelets: 176
Platelets: 209
Platelets: 223
RDW: 13.1
WBC: 11.2 — ABNORMAL HIGH
WBC: 6.5
WBC: 7.3

## 2011-06-12 LAB — COMPREHENSIVE METABOLIC PANEL
AST: 17
AST: 21
Albumin: 1.9 — ABNORMAL LOW
Albumin: 2.5 — ABNORMAL LOW
Calcium: 7.5 — ABNORMAL LOW
Calcium: 8.7
Chloride: 108
Chloride: 110
Creatinine, Ser: 0.71
Creatinine, Ser: 0.73
GFR calc Af Amer: >60
GFR calc Af Amer: >60
Total Bilirubin: 0.4

## 2011-06-12 LAB — URINALYSIS, ROUTINE W REFLEX MICROSCOPIC
Hgb urine dipstick: NEGATIVE
Specific Gravity, Urine: 1.015
Urobilinogen, UA: 0.2
pH: 6.5

## 2011-06-12 LAB — BASIC METABOLIC PANEL
CO2: 21
Chloride: 105
Glucose, Bld: 130 — ABNORMAL HIGH
Potassium: 4.2
Sodium: 135

## 2011-06-12 LAB — URIC ACID
Uric Acid, Serum: 7.2 — ABNORMAL HIGH
Uric Acid, Serum: 7.4 — ABNORMAL HIGH

## 2011-06-15 NOTE — Discharge Summary (Signed)
[name], [name] NO.:  [id number]  MEDICAL RECORD NO.:  [id number]  LOCATION:  2019                         FACILITY:  [facility]  PHYSICIAN:  Richarda Overlie, MD       DATE OF BIRTH:  Nov 08, 1980  DATE OF ADMISSION:  05/31/2011 DATE OF DISCHARGE:  06/05/2011                              DISCHARGE SUMMARY   PRIMARY CARE PHYSICIAN:  Tracey Harries, MD  DISCHARGE DIAGNOSES: 1. Bilateral pulmonary embolism. 2. Pleuritic chest pain secondary to bilateral pulmonary embolism. 3. Hyperthyroidism, possibly Graves disease, microcytic anemia, weight     loss, history of anxiety, G6P6 deficiency.  PROCEDURES:  Chest x-ray shows mild retrocardiac haziness that could reflect left-sided pulmonary embolism with question of minimal underlying pulmonary infarction.  CT angio of the chest shows new right upper lobe segmental PE, resolving left lower lobe PE, new small left pleural effusion.  Repeat chest x-ray on the 21st showed new lower lobe airspace opacity which may represent pulmonary infarction.  CONSULTATIONS:  Dr. Truett Perna for recurrent PE and management of anticoagulation and Dr. Reather Littler for hypothyroidism.  SUBJECTIVE:  This is a 30 year old female admitted for recurrent PE. She had recent bilateral pulmonary embolism on August 28 when she presented to the ER with nausea, vomiting, right upper quadrant pain as well as right pleuritic chest pain.  A Doppler was found to be negative. She was placed on Lovenox 80 mg q.12 and remained on Lovenox throughout her hospitalization.  Coumadin was initiated per pharmacy protocol.  The patient received another 8 days of Lovenox and the Lovenox was discontinued a week later.  The patient was readmitted on September 23 with pleuritic chest pain for 24 hours and 1 episode of blood-tinged sputum with subjective fevers and chills as well as nausea.  INR was found to be 2.09 with a platelet count of 234,000.  The patient has been on  10 mg of Coumadin on a daily basis.  HOSPITAL COURSE: 1. Bilateral recurrent PE.  The patient was placed on Lovenox 75 mg     p.o. b.i.d.  The patient was evaluated by Dr. Truett Perna.  A     hypercoagulation workup was ordered.  Dr. Truett Perna stated that he     doubted that the patient had any inherited hypercoagulable syndrome     with no thrombosis up to 6 pregnancies.  The patient had a false     positive lupus anticoagulant and there was no evidence of any     antiphospholipid antibody syndrome.  The patient was recommended to     resume her Coumadin after about 3 days of Lovenox therapy and     currently the patient's INR is therapeutic and therefore the     patient will be bridged with Lovenox.  Home health RN will be     arranged for close INR monitoring.  It is possible that the     patient's recurrent PE despite being on Coumadin was because of     subtherapeutic levels of INR in the outpatient setting and     therefore strict INR monitoring is required in the outpatient     setting. 2. Because of the patient's  pulmonary embolism, the patient had     subjective fevers consistent with pulmonary infarction.  She was     initiated on broad-spectrum antibiotics namely vancomycin,     ceftazidime to cover her for healthcare-associated pneumonia and     subsequently switched to Avelox. 3. Hypothyroidism.  Consultation by Dr. Reather Littler was obtained and it     was thought that the patient possibly has Graves disease.  She was     started on methimazole and she would eventually need a I-31     treatment but because the patient had a CT scan with dye, she has     to wait for 6 weeks before this can be done.  Her TSH was 0.010 and     her free T4 was found to be 3.85 consistent with hyperthyroidism.     The patient was also started on a beta-blocker because of a     tachycardia which could be secondary to PE versus her     hypothyroidism. 4. Somnolence.  The patient was found to be  somnolent with narcotic     medications which were given to treat her for her chest pain and     therefore the patient has been switched to Ultram.  All her     narcotic medications were discontinued. 5. Microcytic anemia.  The patient has a history of chronic microcytic     anemia.  Hemoglobin remained stable around 11.3.  She has been     asked to avoid any NSAIDs and the patient voiced understanding of     this.  DISPOSITION:  The patient will be discharged home today.  We will check her oxygen saturation.  Currently she is 98% on room air.  Blood pressure is 108/70, respirations 18, pulse 88, temperature 98.4.  The patient appears to be alert, oriented, comfortable currently in no acute cardiopulmonary distress.  Followup appointments with Dr. Truett Perna in 1- 2 weeks.  Follow up with PCP 5-7 days.  Follow up with Dr. Reather Littler in 2 weeks for hypothyroidism.  DISCHARGE MEDICATIONS: 1. Lovenox 75 mg subcu twice daily. 2. Methimazole 10 mg p.o. twice daily. 3. Metoprolol 75 mg p.o. twice daily. 4. Avelox 400 mg p.o. daily for 5 days. 5. Ultram 50 mg p.o. q.6 p.r.n. 6. Coumadin 10 mg p.o. daily.     Richarda Overlie, MD     NA/MEDQ  D:  06/05/2011  T:  06/05/2011  Job:  [id number]  cc:   Tracey Harries, M.D.  Electronically Signed by Richarda Overlie MD on 06/15/2011 02:22:42 PM

## 2011-06-16 NOTE — Consult Note (Signed)
[name], [name] NO.:  [id number]  MEDICAL RECORD NO.:  [id number]  LOCATION:  2019                         FACILITY:  [facility]  PHYSICIAN:  Reather Littler, M.D.       DATE OF BIRTH:  04-17-1981  DATE OF CONSULTATION: DATE OF DISCHARGE:                                CONSULTATION   REASON FOR CONSULTATION:  Hyperthyroidism.  HISTORY:  This is a 30 year old Afro American patient who currently is admitted for recurrent pulmonary embolism.  The patient gives a history of significant amount of weight loss this year.  She says in April of this year, she was given phentermine for weight loss and her weight was about 220 pounds, and she lost about 27 pounds quickly; however, the patient was told to stop phentermine because of fast heart rate, and apparently continued to lose weight and she lost a total of about 60 pounds this year..  She says her appetite is fair with some altered sensation of taste, but she does eat some food, although unbalanced meals.  The patient also says that she has had feeling of heart palpitations and racing heart periodically even when she is resting and since then.  She also complains of feeling excessively hot and sweaty.  She has also positive history of feeling shaky and sometimes her hands shake significantly.  The patient does indicate that she has had loose stools on and off over the last 5 weeks.  She did have swelling of her legs for about a month until recently.  She also says that she generally feels tired and somewhat weak in her arms.  She does not complain of excessive anxiety or panic currently, but previously has had some anxiety episodes.  The patient was tested with her TSH on her last admission about a month ago primarily because of tachycardia and it was found to be low along with free T4 of 4.9.  She was told to follow up with her primary care physician, but apparently she did not do so and iodine uptake and not done  because of her exposure to contrast dye.  The patient is now admitted for bilateral pulmonary embolism and currently still not on any treatment.  Consultation was obtained today because of establishing long-term care for hyperthyroidism.  PAST HISTORY:  As above.  She has had pulmonary embolism.  She also has a surgical history of tubal ligation, pilonidal cyst, cyst on the breasts, sweat gland surgery in axillae.  Other relevant past history is history of gastroenteritis in 2011.  PERSONAL HISTORY:  She has 6 children, last one about 1-1/2 years ago. She does not smoke or drink, and does not use any drugs.  FAMILY HISTORY:  She has a sister with hyperthyroidism.  No history of diabetes.  Positive for hypertension.  MEDICATIONS AT HOME:  Coumadin and metoprolol.  ALLERGIES:  PENICILLIN.  REVIEW OF SYSTEMS:  She has no history of diabetes.  She has had oligomenorrhea for several years and mostly in the last year and half when she has periods every 4 months, and she does not know why.  She has a history of anemia, chronic anxiety.  She does not  complain of any shortness of breath at the moment.  She has no numbness or tingling in the feet.  No headaches.  Other review of systems are covered above.  Past history also as above with no other medical history.  Reportedly, she has had some hypertension, but has been only on metoprolol.  PHYSICAL EXAMINATION:  GENERAL: The patient is alert and pleasant.  She is in no acute distress. VITAL SIGNS:  Her pulse and 100 per minute. HEENT:  She has no tremor of her hands.  She has no swelling of her eyes.  No proptosis.  No significant lid lag or stare.  Eyes externally normal.  Otherwise oral mucosa is normal. NECK:  Exam shows thyroid to be enlarged about one and a half times on the right side and barely palpable on the left side.  No distinct nodules palpable and thyroid is slightly firm in texture.  There is no lymphadenopathy in the  neck.  Her neck does not show any bruit. CARDIAC:  Heart sounds are normal. LUNGS:  Clear. ABDOMEN:  No hepatosplenomegaly. EXTREMITIES:  No edema or skin rash.  No clubbing.  As above, she has no tremor present. NEUROLOGIC:  She was having no focal weakness.  Does have brisk reflexes.  ASSESSMENT:  She has hyperthyroidism, likely to be Graves disease.  PLAN:  Would be to start antithyroid drugs and repeat at least her T4 and plan to follow up in the office.  She will eventually need I-131 treatment, but since she has had CT scan dye last week, we will have to postpone this another 6 weeks.  The patient will be instructed to follow up after discharge.     Reather Littler, M.D.     AK/MEDQ  D:  06/04/2011  T:  06/04/2011  Job:  [id number]  Electronically Signed by Reather Littler M.D. on 06/16/2011 11:22:41 AM

## 2011-06-22 ENCOUNTER — Emergency Department (HOSPITAL_COMMUNITY): Payer: Medicaid Other

## 2011-06-22 ENCOUNTER — Emergency Department (HOSPITAL_COMMUNITY)
Admission: EM | Admit: 2011-06-22 | Discharge: 2011-06-23 | Disposition: A | Discharge status: Home / Self Care | Payer: Medicaid Other | Attending: Emergency Medicine | Admitting: Emergency Medicine

## 2011-06-22 DIAGNOSIS — R110 Nausea: Secondary | ICD-10-CM | POA: Insufficient documentation

## 2011-06-22 DIAGNOSIS — E876 Hypokalemia: Secondary | ICD-10-CM | POA: Insufficient documentation

## 2011-06-22 DIAGNOSIS — I10 Essential (primary) hypertension: Secondary | ICD-10-CM | POA: Insufficient documentation

## 2011-06-22 DIAGNOSIS — J020 Streptococcal pharyngitis: Secondary | ICD-10-CM | POA: Insufficient documentation

## 2011-06-22 DIAGNOSIS — Z86711 Personal history of pulmonary embolism: Secondary | ICD-10-CM | POA: Insufficient documentation

## 2011-06-22 DIAGNOSIS — J189 Pneumonia, unspecified organism: Secondary | ICD-10-CM | POA: Insufficient documentation

## 2011-06-22 DIAGNOSIS — F411 Generalized anxiety disorder: Secondary | ICD-10-CM | POA: Insufficient documentation

## 2011-06-22 DIAGNOSIS — Z7901 Long term (current) use of anticoagulants: Secondary | ICD-10-CM | POA: Insufficient documentation

## 2011-06-22 DIAGNOSIS — R197 Diarrhea, unspecified: Secondary | ICD-10-CM | POA: Insufficient documentation

## 2011-06-22 LAB — CBC
HCT: 38 % (ref 36.0–46.0)
Hemoglobin: 13.2 g/dL (ref 12.0–15.0)
MCH: 28.1 pg (ref 26.0–34.0)
MCHC: 34.7 g/dL (ref 30.0–36.0)
MCV: 80.9 fL (ref 78.0–100.0)
Platelets: 242 10*3/uL (ref 150–400)
RBC: 4.7 MIL/uL (ref 3.87–5.11)
RDW: 14.4 % (ref 11.5–15.5)
WBC: 13.3 10*3/uL — ABNORMAL HIGH (ref 4.0–10.5)

## 2011-06-22 LAB — PROTIME-INR
INR: 1.38 (ref 0.00–1.49)
Prothrombin Time: 17.2 seconds — ABNORMAL HIGH (ref 11.6–15.2)

## 2011-06-22 LAB — DIFFERENTIAL
Basophils Absolute: 0 10*3/uL (ref 0.0–0.1)
Basophils Relative: 0 % (ref 0–1)
Eosinophils Absolute: 0 10*3/uL (ref 0.0–0.7)
Eosinophils Relative: 0 % (ref 0–5)
Lymphocytes Relative: 7 % — ABNORMAL LOW (ref 12–46)
Lymphs Abs: 1 10*3/uL (ref 0.7–4.0)
Monocytes Absolute: 1.3 10*3/uL — ABNORMAL HIGH (ref 0.1–1.0)
Monocytes Relative: 10 % (ref 3–12)
Neutro Abs: 11.1 10*3/uL — ABNORMAL HIGH (ref 1.7–7.7)
Neutrophils Relative %: 83 % — ABNORMAL HIGH (ref 43–77)

## 2011-06-22 LAB — BASIC METABOLIC PANEL
BUN: 4 mg/dL — ABNORMAL LOW (ref 6–23)
CO2: 20 mEq/L (ref 19–32)
Calcium: 9.7 mg/dL (ref 8.4–10.5)
Chloride: 95 mEq/L — ABNORMAL LOW (ref 96–112)
Creatinine, Ser: <0.47 mg/dL — ABNORMAL LOW (ref 0.50–1.10)
Glucose, Bld: 67 mg/dL — ABNORMAL LOW (ref 70–99)
Potassium: 3.1 mEq/L — ABNORMAL LOW (ref 3.5–5.1)
Sodium: 133 mEq/L — ABNORMAL LOW (ref 135–145)

## 2011-06-22 LAB — RAPID STREP SCREEN (MED CTR MEBANE ONLY): Streptococcus, Group A Screen (Direct): POSITIVE — AB

## 2011-06-22 LAB — POCT I-STAT TROPONIN I: Troponin i, poc: 0 ng/mL (ref 0.00–0.08)

## 2011-08-03 ENCOUNTER — Emergency Department (HOSPITAL_COMMUNITY)
Admission: EM | Admit: 2011-08-03 | Discharge: 2011-08-04 | Disposition: A | Discharge status: Home / Self Care | Payer: Medicaid Other | Attending: Emergency Medicine | Admitting: Emergency Medicine

## 2011-08-03 DIAGNOSIS — I10 Essential (primary) hypertension: Secondary | ICD-10-CM | POA: Insufficient documentation

## 2011-08-03 DIAGNOSIS — R197 Diarrhea, unspecified: Secondary | ICD-10-CM | POA: Insufficient documentation

## 2011-08-03 DIAGNOSIS — R079 Chest pain, unspecified: Secondary | ICD-10-CM | POA: Insufficient documentation

## 2011-08-03 DIAGNOSIS — W2209XA Striking against other stationary object, initial encounter: Secondary | ICD-10-CM | POA: Insufficient documentation

## 2011-08-03 DIAGNOSIS — Z86718 Personal history of other venous thrombosis and embolism: Secondary | ICD-10-CM | POA: Insufficient documentation

## 2011-08-03 DIAGNOSIS — S8000XA Contusion of unspecified knee, initial encounter: Secondary | ICD-10-CM

## 2011-08-03 DIAGNOSIS — R109 Unspecified abdominal pain: Secondary | ICD-10-CM | POA: Insufficient documentation

## 2011-08-03 DIAGNOSIS — Z7901 Long term (current) use of anticoagulants: Secondary | ICD-10-CM | POA: Insufficient documentation

## 2011-08-03 HISTORY — DX: Personal history of other venous thrombosis and embolism: Z86.718

## 2011-08-03 HISTORY — DX: Essential (primary) hypertension: I10

## 2011-08-03 NOTE — ED Notes (Signed)
Pt states that she hit her knee last night and then today she had diarrhea, she states that she's concerned about her knee because she had some clots a few months ago

## 2011-08-04 LAB — PROTIME-INR: INR: 1.01 (ref 0.00–1.49)

## 2011-08-04 MED ORDER — ENOXAPARIN SODIUM 150 MG/ML ~~LOC~~ SOLN
1.0000 mg/kg | Freq: Once | SUBCUTANEOUS | Status: AC
  Administered 2011-08-04: 70 mg via SUBCUTANEOUS
  Filled 2011-08-04: qty 1

## 2011-08-04 MED ORDER — WARFARIN SODIUM 10 MG PO TABS
10.0000 mg | ORAL_TABLET | Freq: Once | ORAL | Status: AC
  Administered 2011-08-04: 10 mg via ORAL
  Filled 2011-08-04: qty 1

## 2011-08-04 MED ORDER — ENOXAPARIN SODIUM 150 MG/ML ~~LOC~~ SOLN: 1.0000 mg/kg | Freq: Two times a day (BID) | SUBCUTANEOUS | Status: AC

## 2011-08-04 NOTE — ED Notes (Signed)
Pt alert, nad, c/o left knee pain, onset a few days ago after striking knee on door, pt ambulates to room, steady gait noted, pt states hx of "blot clots", PMS intact X 4

## 2011-08-04 NOTE — ED Provider Notes (Signed)
History     CSN: [id number] Arrival date & time: 08/03/2011 10:59 PM   First MD Initiated Contact with Patient 08/04/11 0257      Chief Complaint  Patient presents with  . Knee Pain  . Diarrhea   HPI  History provided by the patient. Patient presents with multiple complaints. Patient complains of hitting her left knee against the corner of a wall yesterday night. She has some continued pain in knee especially with movement and walking. Patient has been ambulatory. She denies swelling to the knee. Patient also complains of acute onset of diarrhea this morning with some occasional abdominal cramping. She denies fever chills or nausea and vomiting. Patient also tells me that she has a recent history of PE and is currently taking Coumadin and Lovenox injections. States that with everything going on she is worried that maybe she has another blood clot to her lung and complains of some left chest pain. He denies hemoptysis or shortness of breath. Patient also has history of hypertension but no other significant past medical history.   Past Medical History  Diagnosis Date  . Hypertension   . History of blood clots     History reviewed. No pertinent past surgical history.  History reviewed. No pertinent family history.  History  Substance Use Topics  . Smoking status: Not on file  . Smokeless tobacco: Not on file  . Alcohol Use: No    OB History    Grav Para Term Preterm Abortions TAB SAB Ect Mult Living                  Review of Systems  Constitutional: Negative for fever and chills.  Respiratory: Negative for cough and shortness of breath.   Cardiovascular: Positive for chest pain.  Gastrointestinal: Positive for abdominal pain and diarrhea. Negative for nausea and vomiting.  Genitourinary: Negative for dysuria, hematuria and flank pain.  Neurological: Negative for dizziness, syncope and light-headedness.  All other systems reviewed and are negative.    Allergies    Penicillins  Home Medications   Current Outpatient Rx  Name Route Sig Dispense Refill  . LEVOTHYROXINE SODIUM 25 MCG PO TABS Oral Take 25 mcg by mouth daily.      Marland Kitchen METOPROLOL SUCCINATE 100 MG PO TB24 Oral Take 100 mg by mouth daily.      . WARFARIN SODIUM 10 MG PO TABS Oral Take 10 mg by mouth daily.        BP 157/89  Pulse 109  Temp(Src) 98 F (36.7 C) (Oral)  Resp 20  SpO2 99%  Physical Exam  Nursing note and vitals reviewed. Constitutional: She is oriented to person, place, and time. She appears well-developed and well-nourished. No distress.  HENT:  Head: Normocephalic.  Cardiovascular: Normal rate, regular rhythm and normal heart sounds.   No murmur heard. Pulmonary/Chest: Effort normal and breath sounds normal. She has no wheezes. She has no rales.  Abdominal: Soft. She exhibits no distension. There is no tenderness. There is no rebound and no guarding.  Musculoskeletal: Normal range of motion. She exhibits no edema.       Mild tenderness about left knee. No swelling to knee. Negative anterior posterior drawer test. No increased laxity with valgus or varus stress. Normal distal sensations and dorsal pedis pulses. No swelling of lower leg, no tenderness along calf.  Neurological: She is alert and oriented to person, place, and time. No cranial nerve deficit.  Skin: Skin is warm.  Psychiatric: She has a  normal mood and affect. Her behavior is normal.    ED Course  Procedures (including critical care time)   Labs Reviewed  PROTIME-INR   Results for orders placed during the hospital encounter of 08/03/11  PROTIME-INR      Component Value Range   Prothrombin Time 13.5  11.6 - 15.2 (seconds)   INR 1.01  0.00 - 1.49       1. Contusion of knee   2. Chest pain      MDM  3:15 AM vision seen and evaluated. Patient in no acute distress.  Patient with multiple complaints including contusion to left knee, abdominal cramps with diarrhea acute onset yesterday.  Patient also complains of left sharp chest pains similar to past PEs patient currently on Coumadin and Lovenox. Will check INR.   4:30AM Pt discussed with Attending Physician.  Patient is subtherapeutic INR. Patient with normal vital signs today in no respiratory distress. Patient has history of recurrent PEs with multiple prior CT scans. We'll plan to dose patient with Lovenox and her normal dose of Coumadin 10 mg today. Will hold on CT scan as this will likely not change the course of treatment. Will plan to send patient home with a 10 day prescription of Lovenox. Patient will followup with Dr. Truett Perna this week for continued check of INR and evaluation of symptoms.     Angus Seller, [place] 08/04/11 Paulo Fruit

## 2011-08-05 NOTE — ED Provider Notes (Signed)
Medical screening examination/treatment/procedure(s) were performed by non-physician practitioner and as supervising physician I was immediately available for consultation/collaboration.   Lyanne Co, MD 08/05/11 [phone]

## 2011-12-03 ENCOUNTER — Emergency Department (HOSPITAL_COMMUNITY)
Admission: EM | Admit: 2011-12-03 | Discharge: 2011-12-04 | Disposition: A | Discharge status: Home / Self Care | Payer: Medicaid Other | Attending: Emergency Medicine | Admitting: Emergency Medicine

## 2011-12-03 DIAGNOSIS — R791 Abnormal coagulation profile: Secondary | ICD-10-CM | POA: Insufficient documentation

## 2011-12-03 DIAGNOSIS — M79609 Pain in unspecified limb: Secondary | ICD-10-CM | POA: Insufficient documentation

## 2011-12-03 DIAGNOSIS — IMO0002 Reserved for concepts with insufficient information to code with codable children: Secondary | ICD-10-CM | POA: Insufficient documentation

## 2011-12-03 DIAGNOSIS — I10 Essential (primary) hypertension: Secondary | ICD-10-CM | POA: Insufficient documentation

## 2011-12-03 DIAGNOSIS — Z7901 Long term (current) use of anticoagulants: Secondary | ICD-10-CM | POA: Insufficient documentation

## 2011-12-03 DIAGNOSIS — T148XXA Other injury of unspecified body region, initial encounter: Secondary | ICD-10-CM

## 2011-12-03 DIAGNOSIS — Z86718 Personal history of other venous thrombosis and embolism: Secondary | ICD-10-CM | POA: Insufficient documentation

## 2011-12-03 DIAGNOSIS — Y838 Other surgical procedures as the cause of abnormal reaction of the patient, or of later complication, without mention of misadventure at the time of the procedure: Secondary | ICD-10-CM | POA: Insufficient documentation

## 2011-12-03 DIAGNOSIS — Z79899 Other long term (current) drug therapy: Secondary | ICD-10-CM | POA: Insufficient documentation

## 2011-12-03 DIAGNOSIS — IMO0001 Reserved for inherently not codable concepts without codable children: Secondary | ICD-10-CM | POA: Insufficient documentation

## 2011-12-04 ENCOUNTER — Emergency Department (HOSPITAL_COMMUNITY): Payer: Medicaid Other

## 2011-12-04 ENCOUNTER — Encounter (HOSPITAL_COMMUNITY): Payer: Self-pay

## 2011-12-04 LAB — PROTIME-INR: Prothrombin Time: >90 seconds — ABNORMAL HIGH (ref 11.6–15.2)

## 2011-12-04 MED ORDER — HYDROCODONE-ACETAMINOPHEN 5-325 MG PO TABS: 1.0000 | ORAL_TABLET | ORAL | Status: AC | PRN

## 2011-12-04 MED ORDER — HYDROCODONE-ACETAMINOPHEN 5-325 MG PO TABS
1.0000 | ORAL_TABLET | Freq: Once | ORAL | Status: AC
  Administered 2011-12-04: 1 via ORAL
  Filled 2011-12-04: qty 1

## 2011-12-04 MED ORDER — PHYTONADIONE 5 MG PO TABS
10.0000 mg | ORAL_TABLET | Freq: Once | ORAL | Status: AC
  Administered 2011-12-04: 10 mg via ORAL
  Filled 2011-12-04: qty 2

## 2011-12-04 NOTE — ED Notes (Signed)
Patient transported to X-ray 

## 2011-12-04 NOTE — ED Provider Notes (Signed)
History     CSN: [id number]  Arrival date & time 12/03/11  2358   First MD Initiated Contact with Patient 12/04/11 0139      Chief Complaint  Patient presents with  . Arm Pain    (Consider location/radiation/quality/duration/timing/severity/associated sxs/prior treatment) HPI Comments: Patient here with acute onset of right arm pain with noticeable lump to radial aspect of the forearm.  She denies fever, chills, states that she has previously had surgery to right axillary area and has a history of lymphedema occasionally to the hand.   I currently on coumadin for blood clots - denies any injury to the arm.  Patient is a 31 y.o. female presenting with arm pain. The history is provided by the patient. No language interpreter was used.  Arm Pain This is a new problem. The current episode started yesterday. The problem occurs constantly. The problem has been unchanged. Associated symptoms include myalgias. Pertinent negatives include no abdominal pain, anorexia, arthralgias, change in bowel habit, chest pain, chills, congestion, coughing, diaphoresis, fatigue, fever, headaches, joint swelling, nausea, neck pain, numbness, rash, sore throat, swollen glands, urinary symptoms, vertigo, visual change, vomiting or weakness. Exacerbated by: movement. She has tried nothing for the symptoms. The treatment provided no relief.    Past Medical History  Diagnosis Date  . Hypertension   . History of blood clots     History reviewed. No pertinent past surgical history.  History reviewed. No pertinent family history.  History  Substance Use Topics  . Smoking status: Not on file  . Smokeless tobacco: Not on file  . Alcohol Use: No    OB History    Grav Para Term Preterm Abortions TAB SAB Ect Mult Living                  Review of Systems  Constitutional: Negative for fever, chills, diaphoresis and fatigue.  HENT: Negative for congestion, sore throat and neck pain.   Respiratory: Negative  for cough.   Cardiovascular: Negative for chest pain.  Gastrointestinal: Negative for nausea, vomiting, abdominal pain, anorexia and change in bowel habit.  Musculoskeletal: Positive for myalgias. Negative for joint swelling and arthralgias.  Skin: Negative for rash.  Neurological: Negative for vertigo, weakness, numbness and headaches.  All other systems reviewed and are negative.    Allergies  Penicillins  Home Medications   Current Outpatient Rx  Name Route Sig Dispense Refill  . METOPROLOL SUCCINATE ER 100 MG PO TB24 Oral Take 100 mg by mouth daily.      . WARFARIN SODIUM 10 MG PO TABS Oral Take 10 mg by mouth daily.      Marland Kitchen LEVOTHYROXINE SODIUM 25 MCG PO TABS Oral Take 25 mcg by mouth daily.        BP 156/90  Pulse 111  Temp(Src) 98 F (36.7 C) (Oral)  Resp 18  SpO2 100%  LMP 11/30/2011  Physical Exam  Nursing note and vitals reviewed. Constitutional: She is oriented to person, place, and time. She appears well-developed and well-nourished. No distress.  HENT:  Head: Normocephalic and atraumatic.  Right Ear: External ear normal.  Left Ear: External ear normal.  Nose: Nose normal.  Mouth/Throat: Oropharynx is clear and moist. No oropharyngeal exudate.  Eyes: Conjunctivae are normal. Pupils are equal, round, and reactive to light. No scleral icterus.  Neck: Normal range of motion. Neck supple.  Cardiovascular: Normal rate, regular rhythm and normal heart sounds.  Exam reveals no gallop and no friction rub.   No murmur  heard. Pulmonary/Chest: Effort normal and breath sounds normal. No respiratory distress. She has no wheezes. She has no rales. She exhibits no tenderness.  Abdominal: Soft. Bowel sounds are normal. She exhibits no distension and no mass. There is no tenderness. There is no rebound and no guarding.  Musculoskeletal:       3cm painful non-moveable mass noted to the right forearm on the radial aspect of the dorsum of the forearm.  Lymphadenopathy:    She  has no cervical adenopathy.  Neurological: She is alert and oriented to person, place, and time. No cranial nerve deficit.  Skin: Skin is warm and dry. No rash noted. No erythema. No pallor.  Psychiatric: She has a normal mood and affect. Her behavior is normal. Judgment and thought content normal.    ED Course  Procedures (including critical care time)  Labs Reviewed - No data to display No results found.  Results for orders placed during the hospital encounter of 12/03/11  PROTIME-INR      Component Value Range   Prothrombin Time >90.0 (*) 11.6 - 15.2 (seconds)   INR >10.00 (*) 0.00 - 1.49    Dg Forearm Right  12/04/2011  *RADIOLOGY REPORT*  Clinical Data: Diffuse right forearm pain.  RIGHT FOREARM - 2 VIEW  Comparison: Right hand radiographs performed 01/22/2008  Findings: There is no evidence of fracture or dislocation.  The radius and ulna are unremarkable in appearance.  Mild negative ulnar variance is seen.  The carpal rows appear grossly intact, and demonstrate normal alignment.  The elbow joint is grossly unremarkable in appearance; no elbow joint effusion is identified.  No significant soft tissue abnormalities are seen.  IMPRESSION: No evidence of fracture or dislocation.  Original Report Authenticated By: Tonia Ghent, M.D.     Hematoma Supratherapeutic INR    MDM  Patient here with hematoma to right forearm with noted markedly elevated INR >10 - patient without active bleeding at this time.  Have given her Vitamin K 10mg  orally and she will return in 48 hours for INR re-check.  Patient is aware that should return should she develop any bleeding.  As she cannot get into her PCP on Sunday, she will return here for INR check - she will hold coumadin until then.        Izola Price [place], [place] 12/04/11 [phone]

## 2011-12-04 NOTE — ED Notes (Signed)
Lab results reported to EDP, [place]

## 2011-12-04 NOTE — ED Provider Notes (Signed)
Medical screening examination/treatment/procedure(s) were conducted as a shared visit with non-physician practitioner(s) and myself.  I personally evaluated the patient during the encounter  5:44 AM Small, tender hematoma right forearm. No active bleeding externally. Advised of INR. We'll start on vitamin K and have her rechecked in 48 hours.  Hanley Seamen, MD 12/04/11 [phone]

## 2011-12-04 NOTE — ED Notes (Signed)
Pt states that her right arm started hurting this evening and then she noticed a lump in it that's very painful

## 2011-12-04 NOTE — Discharge Instructions (Signed)
Contusion A contusion is a deep bruise. Contusions are the result of an injury that caused bleeding under the skin. The contusion may turn blue, purple, or yellow. Minor injuries will give you a painless contusion, but more severe contusions may stay painful and swollen for a few weeks.  CAUSES  A contusion is usually caused by a blow, trauma, or direct force to an area of the body. SYMPTOMS   Swelling and redness of the injured area.   Bruising of the injured area.   Tenderness and soreness of the injured area.   Pain.  DIAGNOSIS  The diagnosis can be made by taking a history and physical exam. An X-ray, CT scan, or MRI may be needed to determine if there were any associated injuries, such as fractures. TREATMENT  Specific treatment will depend on what area of the body was injured. In general, the best treatment for a contusion is resting, icing, elevating, and applying cold compresses to the injured area. Over-the-counter medicines may also be recommended for pain control. Ask your caregiver what the best treatment is for your contusion. HOME CARE INSTRUCTIONS   Put ice on the injured area.   Put ice in a plastic bag.   Place a towel between your skin and the bag.   Leave the ice on for 15 to 20 minutes, 3 to 4 times a day.   Only take over-the-counter or prescription medicines for pain, discomfort, or fever as directed by your caregiver. Your caregiver may recommend avoiding anti-inflammatory medicines (aspirin, ibuprofen, and naproxen) for 48 hours because these medicines may increase bruising.   Rest the injured area.   If possible, elevate the injured area to reduce swelling.  SEEK IMMEDIATE MEDICAL CARE IF:   You have increased bruising or swelling.   You have pain that is getting worse.   Your swelling or pain is not relieved with medicines.  MAKE SURE YOU:   Understand these instructions.   Will watch your condition.   Will get help right away if you are not  doing well or get worse.  Document Released: 06/03/2005 Document Revised: 08/13/2011 Document Reviewed: 06/29/2011 [facility] Patient Information 2012 [place], [place].Coagulopathy Coagulopathy (bleeding disorder) is an abnormal condition that makes it difficult for blood to clot. Platelets (specialized blood cells) and clotting factors (special blood proteins) combine to help the blood clot when a cut has been made in the skin. If platelets and clotting factors are not present in sufficient numbers, clots cannot form properly to stop bleeding.  CAUSES  Bleeding disorders usually have two causes:  Acquired bleeding disorders are due to a disease process or are drug induced, such as:   Severe liver disease.   Over treatment of Coumadin (Warfarin).   Drug induced Immune Thrombocytopenia. Heparin is a common cause.   Bone marrow disorders.   Lupus.   Prolonged use of antibiotics may cause vitamin K deficiency.   Congenital (Inherited) bleeding disorders.   Hemophilia.   Deficiencies of clotting Factors II, V, VII, IX, X.   Von Willebrands Disease. People with this disease are especially prone to bleeding if aspirin or NSAIDS (nonsteroidal anti-inflammatory drugs) products are taken.  SYMPTOMS  Whether the coagulopathy is acquired or congenital, symptoms are usually the same:   Unusual bleeding or bleeding very easily such as:   Nosebleeds.   Bloody stools or blood in the urine.   Abnormal or very heavy menstrual bleeding.   Cuts that bleed excessively.   Excessive bruising:   Bruising very  easily.   Unexplained bruising.   Dizziness.  DIAGNOSIS  Coagulopathy is usually diagnosed by blood tests and a physical exam. This includes:  Clotting factor blood tests such as:   Partial Thromboplastin Time (PTT) and Prothrombin Time (PT). These tests show how long it takes your blood to clot. PTT and PT results may be high with a bleeding disorder.   Platelet counts. Platelets  are a type of blood cell that help stop bleeding. The platelet count may be low with a bleeding disorder.   Fibrinogen levels. Fibrinogen is a protein produced by the liver. It helps blood to clot. The Fibrinogen level may be low with bleeding disorder.   A physical exam may reveal bruising throughout the body.   Small petechiae (small pinpoint red or purple "dots"). Petechiae has a rash like appearance but does not itch. It may appear anywhere on the body, including the hands, feet, inside the mouth or even in the whites of the eyes.  TREATMENT  Treatment depends on whether the coagulopathy is acquired or congenital.   If clotting factors are low (deficient), these may need to be replaced.   Platelet transfusions may need to be given.   If your caregiver has determined that a medication is the cause of the bleeding disorder, your caregiver may need to adjust or discontinue the medication.   Treatment of the underlying disease state to correct the coagulopathy.   If your Vitamin K level is low, it will need to be replaced.  COMPLICATIONS OF COAGULOPATHY A bleeding disorder can develop very serious complications such as:  Bleeding into the brain (Intracerebral bleeding).   Liver failure.   Kidney failure.   Gastrointestinal bleeding.  SEEK IMMEDIATE MEDICAL CARE IF:  You have large areas of bruising.   You have a severe headache that does not go away.   You have blood in your urine or stool.   You vomit blood.   You develop redness or rashes on your skin.   You become dizzy or pass out (loss of consciousness).   You develop chest pain or shortness of breath. If the chest pain or shortness of breath is severe, call your local emergency service immediately.  MAKE SURE YOU:   Understand these instructions.   Will watch your condition.   Will get help right away if you are not doing well or get worse.  Document Released: 06/13/2004 Document Revised: 08/13/2011 Document  Reviewed: 10/04/2008 [facility] Patient Information 2012 [place], [place].   STOP YOUR COUMADIN UNTIL YOU CAN GET YOUR LABS CHECKED HERE ON Sunday - COME HERE FOR THE CHECK, RETURN SOONER WITH ANY ACTIVE BLEEDING OR ANY OTHER CONCERNS.

## 2011-12-04 NOTE — ED Notes (Signed)
Pt given written and verbal discharge instructions with Rx for hydrocodone-verbalizes understanding to return here Sunday to have her PT re-checked-to take it easy this weekend-return sooner for any abnormal bleeding-do not shave , use knives, no harsh brushing of gums

## 2011-12-06 ENCOUNTER — Emergency Department (HOSPITAL_COMMUNITY)
Admission: EM | Admit: 2011-12-06 | Discharge: 2011-12-06 | Disposition: A | Discharge status: Home / Self Care | Payer: Medicaid Other | Attending: Emergency Medicine | Admitting: Emergency Medicine

## 2011-12-06 ENCOUNTER — Encounter (HOSPITAL_COMMUNITY): Payer: Self-pay

## 2011-12-06 DIAGNOSIS — R791 Abnormal coagulation profile: Secondary | ICD-10-CM | POA: Insufficient documentation

## 2011-12-06 DIAGNOSIS — Z09 Encounter for follow-up examination after completed treatment for conditions other than malignant neoplasm: Secondary | ICD-10-CM | POA: Insufficient documentation

## 2011-12-06 DIAGNOSIS — I10 Essential (primary) hypertension: Secondary | ICD-10-CM | POA: Insufficient documentation

## 2011-12-06 NOTE — ED Provider Notes (Signed)
History     CSN: [id number]  Arrival date & time 12/06/11  1554   First MD Initiated Contact with Patient 12/06/11 1809      Chief Complaint  Patient presents with  . Follow-up    (Consider location/radiation/quality/duration/timing/severity/associated sxs/prior treatment) HPI History provided by pt and prior chart.  Per prior chart, pt seen in ED on 12/04/11 for hematoma of right forearm and INR elevated to greater than 10.  Was instructed to hold coumadin and return to ED in 2 days for recheck.  Pt held her coumadin yesterday and today.  Reports that her hematoma seems to be spreading.  Denies bleeding/bruising in any other location and is otherwise feeling well.   Past Medical History  Diagnosis Date  . Hypertension   . History of blood clots     History reviewed. No pertinent past surgical history.  No family history on file.  History  Substance Use Topics  . Smoking status: Not on file  . Smokeless tobacco: Not on file  . Alcohol Use: No    OB History    Grav Para Term Preterm Abortions TAB SAB Ect Mult Living                  Review of Systems  All other systems reviewed and are negative.    Allergies  Penicillins  Home Medications   Current Outpatient Rx  Name Route Sig Dispense Refill  . HYDROCODONE-ACETAMINOPHEN 5-325 MG PO TABS Oral Take 1 tablet by mouth every 4 (four) hours as needed for pain. 30 tablet 0  . LEVOTHYROXINE SODIUM 25 MCG PO TABS Oral Take 25 mcg by mouth daily.      Marland Kitchen METOPROLOL SUCCINATE ER 100 MG PO TB24 Oral Take 100 mg by mouth daily.        BP 139/82  Pulse 90  Temp(Src) 98 F (36.7 C) (Oral)  Resp 16  Ht 5\' 1"  (1.549 m)  Wt 179 lb (81.194 kg)  BMI 33.82 kg/m2  SpO2 99%  LMP 11/30/2011  Physical Exam  Nursing note and vitals reviewed. Constitutional: She is oriented to person, place, and time. She appears well-developed and well-nourished. No distress.  HENT:  Head: Normocephalic and atraumatic.  Eyes:   Normal appearance  Neck: Normal range of motion.  Pulmonary/Chest: Effort normal.  Musculoskeletal:       Mild ecchymosis w/ poorly defined borders on right medial forearm.  No edema or tenderness.    Neurological: She is alert and oriented to person, place, and time.  Psychiatric: She has a normal mood and affect. Her behavior is normal.    ED Course  Procedures (including critical care time)   Labs Reviewed  PROTIME-INR   No results found.   1. Abnormal INR       MDM  Pt seen for hematoma of right forearm 2 days ago and INR >10.  Presents today for INR recheck and it is 1.0 today.  Hematoma of right forearm unimpressive and pt denies bruising/bleeding in any other location.  Instructed to resume her nml dose of coumadin and f/u with her doctor in 3 days for recheck.  Return precautions discussed.         Otilio Miu, [place] 12/06/11 1827

## 2011-12-06 NOTE — ED Notes (Signed)
Pt seen and treated here last Thursday- DX with coumadin level too high, received vit k then was told to come back on Friday and receive vit k since md office closed- Pt presents with no acute distress

## 2011-12-06 NOTE — ED Provider Notes (Signed)
Medical screening examination/treatment/procedure(s) were performed by non-physician practitioner and as supervising physician I was immediately available for consultation/collaboration.   Dayton Bailiff, MD 12/06/11 [phone]

## 2011-12-06 NOTE — Discharge Instructions (Signed)
Restart your coumadin this evening.  Follow up with Dr. Salvadore Farber for INR recheck in 3 days.  You should return to the ER if you develop suspicious bruising, blood in your stool or bleeding in any other location.

## 2011-12-16 ENCOUNTER — Encounter (HOSPITAL_COMMUNITY): Payer: Self-pay | Admitting: *Deleted

## 2011-12-16 ENCOUNTER — Emergency Department (HOSPITAL_COMMUNITY)
Admission: EM | Admit: 2011-12-16 | Discharge: 2011-12-16 | Disposition: A | Discharge status: Home / Self Care | Payer: Medicaid Other | Attending: Emergency Medicine | Admitting: Emergency Medicine

## 2011-12-16 DIAGNOSIS — I10 Essential (primary) hypertension: Secondary | ICD-10-CM | POA: Insufficient documentation

## 2011-12-16 DIAGNOSIS — R197 Diarrhea, unspecified: Secondary | ICD-10-CM | POA: Insufficient documentation

## 2011-12-16 DIAGNOSIS — K625 Hemorrhage of anus and rectum: Secondary | ICD-10-CM

## 2011-12-16 DIAGNOSIS — K921 Melena: Secondary | ICD-10-CM | POA: Insufficient documentation

## 2011-12-16 DIAGNOSIS — R791 Abnormal coagulation profile: Secondary | ICD-10-CM | POA: Insufficient documentation

## 2011-12-16 DIAGNOSIS — Z86718 Personal history of other venous thrombosis and embolism: Secondary | ICD-10-CM | POA: Insufficient documentation

## 2011-12-16 DIAGNOSIS — Z7901 Long term (current) use of anticoagulants: Secondary | ICD-10-CM | POA: Insufficient documentation

## 2011-12-16 LAB — CBC
HCT: 35.8 % — ABNORMAL LOW (ref 36.0–46.0)
Hemoglobin: 12.5 g/dL (ref 12.0–15.0)
MCH: 29 pg (ref 26.0–34.0)
MCHC: 34.9 g/dL (ref 30.0–36.0)
RBC: 4.31 MIL/uL (ref 3.87–5.11)

## 2011-12-16 LAB — COMPREHENSIVE METABOLIC PANEL
Albumin: 3.5 g/dL (ref 3.5–5.2)
Alkaline Phosphatase: 148 U/L — ABNORMAL HIGH (ref 39–117)
BUN: 8 mg/dL (ref 6–23)
Chloride: 105 mEq/L (ref 96–112)
Creatinine, Ser: 0.4 mg/dL — ABNORMAL LOW (ref 0.50–1.10)
GFR calc Af Amer: >90 mL/min (ref >90)
GFR calc non Af Amer: >90 mL/min (ref >90)
Glucose, Bld: 76 mg/dL (ref 70–99)
Total Bilirubin: 0.7 mg/dL (ref 0.3–1.2)

## 2011-12-16 LAB — DIFFERENTIAL
Basophils Relative: 0 % (ref 0–1)
Lymphs Abs: 3.1 10*3/uL (ref 0.7–4.0)
Monocytes Absolute: 0.6 10*3/uL (ref 0.1–1.0)
Monocytes Relative: 11 % (ref 3–12)
Neutro Abs: 1.9 10*3/uL (ref 1.7–7.7)

## 2011-12-16 LAB — PROTIME-INR
INR: 8.82 (ref 0.00–1.49)
Prothrombin Time: 73.4 seconds — ABNORMAL HIGH (ref 11.6–15.2)

## 2011-12-16 MED ORDER — PHYTONADIONE 5 MG PO TABS
10.0000 mg | ORAL_TABLET | Freq: Once | ORAL | Status: AC
  Administered 2011-12-16: 10 mg via ORAL
  Filled 2011-12-16: qty 2

## 2011-12-16 NOTE — ED Notes (Signed)
Pt states "my doctor's office called & told me my coumadin was 7.4, I also had a loose stool and there was some blood in it, so the doctor told me to come on to the hospital"

## 2011-12-16 NOTE — ED Provider Notes (Signed)
History     CSN: [id number]  Arrival date & time 12/16/11  1246   First MD Initiated Contact with Patient 12/16/11 1513      Chief Complaint  Patient presents with  . Medication Reaction    coumadin elevated    (Consider location/radiation/quality/duration/timing/severity/associated sxs/prior treatment) HPI The patient presents with concerns of supratherapeutic INR, and bloody stool.  She notes that approximately one week ago she was in this facility due to hematoma in her right arm.  At that time her INR was found to be 10.  She received vitamin K.  A subsequent evaluation demonstrated an INR of 1.  She restarted her Coumadin 3 days ago.  Over the past 2 days she has had diarrhea, with small amounts of visible blood both yesterday and today.  During this time she has had one outpatient blood draw, she was informed of the results today, and told her INR was greater than 7.  The rectal bleeding, the elevated INR, she was referred to the ED for further evaluation.  The patient denies any associated abdominal pain, chest pain, dyspnea, fevers, chills, or any other focal complaints. Past Medical History  Diagnosis Date  . Hypertension   . History of blood clots     Past Surgical History  Procedure Date  . Pilonidal cyst / sinus excision   . Breast surgery     cyst removed from right  . Cesarean section     No family history on file.  History  Substance Use Topics  . Smoking status: Never Smoker   . Smokeless tobacco: Not on file  . Alcohol Use: No    OB History    Grav Para Term Preterm Abortions TAB SAB Ect Mult Living                  Review of Systems  Constitutional:       HPI  HENT:       HPI otherwise negative  Eyes: Negative.   Respiratory:       HPI, otherwise negative  Cardiovascular:       HPI, otherwise nmegative  Gastrointestinal: Negative for vomiting.  Genitourinary:       HPI, otherwise negative  Musculoskeletal:       HPI, otherwise negative    Skin: Negative.   Neurological: Negative for syncope.    Allergies  Penicillins  Home Medications   Current Outpatient Rx  Name Route Sig Dispense Refill  . DIPHENHYDRAMINE HCL 25 MG PO CAPS Oral Take 75 mg by mouth daily as needed. allergies    . METOPROLOL TARTRATE 25 MG PO TABS Oral Take 25 mg by mouth 2 (two) times daily.    . WARFARIN SODIUM 10 MG PO TABS Oral Take 10 mg by mouth daily.      BP 125/77  Pulse 97  Temp(Src) 98.3 F (36.8 C) (Oral)  Resp 16  Wt 177 lb (80.287 kg)  SpO2 100%  LMP 11/30/2011  Physical Exam  Nursing note and vitals reviewed. Constitutional: She is oriented to person, place, and time. She appears well-developed and well-nourished. No distress.  HENT:  Head: Normocephalic and atraumatic.  Eyes: Conjunctivae and EOM are normal.  Cardiovascular: Normal rate and regular rhythm.   Pulmonary/Chest: Effort normal and breath sounds normal. No stridor. No respiratory distress.  Abdominal: She exhibits no distension.  Genitourinary:       Patient defers rectal exam  Musculoskeletal: She exhibits no edema.  Neurological: She is alert and  oriented to person, place, and time. No cranial nerve deficit.  Skin: Skin is warm and dry.  Psychiatric: She has a normal mood and affect.    ED Course  Procedures (including critical care time)   Labs Reviewed  CBC  DIFFERENTIAL  COMPREHENSIVE METABOLIC PANEL  PROTIME-INR   No results found.   No diagnosis found.    MDM  This young female with recent diagnosis of PE, now anticoagulated presents with concerns over rectal bleeding and possibly supratherapeutic Coumadin level.  On my exam the patient is in no distress was unremarkable vital signs, a soft abdomen.  The patient's labs are notable for elevated INR.  I discussed the possibilities for her labile anticoagulation status at length.  We discussed the need for consideration of other anticoagulants.  Given the patient's absence of distress,  unremarkable vital signs, she received a dose of oral vitamin K and was discharged to follow up with her physician in the morning to discuss alternatives.  Explicit return precautions were provided.  Gerhard Munch, MD 12/16/11 1715

## 2011-12-16 NOTE — Discharge Instructions (Signed)
As discussed, it is very important that you follow up with your physician in the morning to discuss appropriate anticoagulation strategies.  If you develop any new, or concerning changes in the interim, please be sure to return to the emergency department immediately.

## 2012-02-29 ENCOUNTER — Encounter (HOSPITAL_COMMUNITY): Payer: Self-pay | Admitting: Emergency Medicine

## 2012-02-29 ENCOUNTER — Emergency Department (HOSPITAL_COMMUNITY): Payer: Medicaid Other

## 2012-02-29 ENCOUNTER — Emergency Department (HOSPITAL_COMMUNITY)
Admission: EM | Admit: 2012-02-29 | Discharge: 2012-03-01 | Disposition: A | Discharge status: Home / Self Care | Payer: Medicaid Other | Attending: Emergency Medicine | Admitting: Emergency Medicine

## 2012-02-29 DIAGNOSIS — R197 Diarrhea, unspecified: Secondary | ICD-10-CM | POA: Insufficient documentation

## 2012-02-29 DIAGNOSIS — R0602 Shortness of breath: Secondary | ICD-10-CM | POA: Insufficient documentation

## 2012-02-29 DIAGNOSIS — F411 Generalized anxiety disorder: Secondary | ICD-10-CM | POA: Insufficient documentation

## 2012-02-29 DIAGNOSIS — R112 Nausea with vomiting, unspecified: Secondary | ICD-10-CM

## 2012-02-29 DIAGNOSIS — I10 Essential (primary) hypertension: Secondary | ICD-10-CM | POA: Insufficient documentation

## 2012-02-29 HISTORY — DX: Calculus of gallbladder without cholecystitis without obstruction: K80.20

## 2012-02-29 MED ORDER — ONDANSETRON 8 MG PO TBDP
8.0000 mg | ORAL_TABLET | Freq: Once | ORAL | Status: DC
Start: 2012-02-29 — End: 2012-03-01

## 2012-02-29 NOTE — ED Notes (Signed)
Pt states she has had 3 PEs in the past year  Pt states the last one was in November  Pt states since 5am this morning she has had diarrhea and vomiting with nausea  Pt states that is how she presented with the first blood clot so she is concerned she may have another one  Pt is in no acute distress in triage  Pt denies chest pain but states she has a little shortness of breath

## 2012-03-01 LAB — URINE MICROSCOPIC-ADD ON

## 2012-03-01 LAB — URINALYSIS, ROUTINE W REFLEX MICROSCOPIC
Glucose, UA: NEGATIVE mg/dL
Ketones, ur: NEGATIVE mg/dL
Protein, ur: NEGATIVE mg/dL
Urobilinogen, UA: 0.2 mg/dL (ref 0.0–1.0)

## 2012-03-01 LAB — CBC
MCH: 29.7 pg (ref 26.0–34.0)
MCHC: 35.5 g/dL (ref 30.0–36.0)
MCV: 83.7 fL (ref 78.0–100.0)
Platelets: 354 10*3/uL (ref 150–400)
RBC: 4.71 MIL/uL (ref 3.87–5.11)
RDW: 12.2 % (ref 11.5–15.5)

## 2012-03-01 LAB — POCT I-STAT, CHEM 8
Calcium, Ion: 1.24 mmol/L (ref 1.12–1.32)
Chloride: 104 mEq/L (ref 96–112)
Glucose, Bld: 98 mg/dL (ref 70–99)
HCT: 40 % (ref 36.0–46.0)
TCO2: 22 mmol/L (ref 0–100)

## 2012-03-01 LAB — DIFFERENTIAL
Basophils Absolute: 0 10*3/uL (ref 0.0–0.1)
Basophils Relative: 0 % (ref 0–1)
Eosinophils Absolute: 0.3 10*3/uL (ref 0.0–0.7)
Eosinophils Relative: 4 % (ref 0–5)
Neutrophils Relative %: 46 % (ref 43–77)

## 2012-03-01 LAB — D-DIMER, QUANTITATIVE: D-Dimer, Quant: 0.31 ug/mL-FEU (ref 0.00–0.48)

## 2012-03-01 MED ORDER — ONDANSETRON HCL 4 MG PO TABS: 4.0000 mg | ORAL_TABLET | Freq: Four times a day (QID) | ORAL | Status: AC

## 2012-03-01 MED ORDER — SODIUM CHLORIDE 0.9 % IV BOLUS (SEPSIS)
1000.0000 mL | Freq: Once | INTRAVENOUS | Status: AC
  Administered 2012-03-01: 1000 mL via INTRAVENOUS

## 2012-03-01 MED ORDER — ONDANSETRON HCL 4 MG/2ML IJ SOLN
4.0000 mg | Freq: Once | INTRAMUSCULAR | Status: AC
  Administered 2012-03-01: 4 mg via INTRAVENOUS
  Filled 2012-03-01: qty 2

## 2012-03-01 NOTE — ED Provider Notes (Signed)
History     CSN: [id number]  Arrival date & time 02/29/12  2013   First MD Initiated Contact with Patient 03/01/12 0035      Chief Complaint  Patient presents with  . Emesis  . Diarrhea  . Nausea  . Shortness of Breath    (Consider location/radiation/quality/duration/timing/severity/associated sxs/prior treatment) HPI Comments: N/V/D since this Am this is the initial presentation she had with a previous PE   No family members are ill   The history is provided by the patient.    Past Medical History  Diagnosis Date  . Hypertension   . History of blood clots   . Gallstones   . Hypothyroid     Past Surgical History  Procedure Date  . Pilonidal cyst / sinus excision   . Breast surgery     cyst removed from right  . Cesarean section     Family History  Problem Relation Age of Onset  . Hypertension Other   . Hyperlipidemia Other     History  Substance Use Topics  . Smoking status: Never Smoker   . Smokeless tobacco: Not on file  . Alcohol Use: No    OB History    Grav Para Term Preterm Abortions TAB SAB Ect Mult Living                  Review of Systems  Constitutional: Positive for fever. Negative for chills.  Respiratory: Positive for shortness of breath.   Gastrointestinal: Positive for nausea, vomiting and diarrhea. Negative for abdominal pain and constipation.  Psychiatric/Behavioral: The patient is nervous/anxious.     Allergies  Penicillins  Home Medications   Current Outpatient Rx  Name Route Sig Dispense Refill  . DIPHENHYDRAMINE HCL 25 MG PO CAPS Oral Take 75 mg by mouth daily as needed. allergies    . LEVOTHYROXINE SODIUM 200 MCG PO TABS Oral Take 200 mcg by mouth daily.    Marland Kitchen METOPROLOL TARTRATE 25 MG PO TABS Oral Take 25 mg by mouth 2 (two) times daily.    Marland Kitchen RIVAROXABAN 10 MG PO TABS Oral Take 20 mg by mouth daily.    Marland Kitchen ONDANSETRON HCL 4 MG PO TABS Oral Take 1 tablet (4 mg total) by mouth every 6 (six) hours. 12 tablet 0    BP  137/76  Pulse 109  Temp 98 F (36.7 C) (Oral)  Resp 13  SpO2 100%  LMP 02/14/2012  Physical Exam  Constitutional: She appears well-developed. No distress.  Eyes: Pupils are equal, round, and reactive to light.  Neck: Normal range of motion.  Pulmonary/Chest: She exhibits no tenderness.  Abdominal: Soft. She exhibits no distension. There is no tenderness.  Musculoskeletal: Normal range of motion.  Neurological: She is alert.  Skin: Skin is warm.    ED Course  Procedures (including critical care time)  Labs Reviewed  URINALYSIS, ROUTINE W REFLEX MICROSCOPIC - Abnormal; Notable for the following:    APPearance CLOUDY (*)     Leukocytes, UA SMALL (*)     All other components within normal limits  POCT I-STAT, CHEM 8 - Abnormal; Notable for the following:    Creatinine, Ser 0.40 (*)     All other components within normal limits  URINE MICROSCOPIC-ADD ON - Abnormal; Notable for the following:    Squamous Epithelial / LPF FEW (*)     Bacteria, UA MANY (*)     All other components within normal limits  PREGNANCY, URINE  CBC  DIFFERENTIAL  D-DIMER,  QUANTITATIVE   Dg Chest 2 View  02/29/2012  *RADIOLOGY REPORT*  Clinical Data: 31 year old female with nausea and vomiting. Shortness of breath.  CHEST - 2 VIEW  Comparison: 06/22/2011 and earlier.  Findings: Interval clearance of the mild left perihilar pulmonary opacity previously noted.  Stable lung volumes.  Cardiac size and mediastinal contours are within normal limits.  No pneumothorax, pulmonary edema, pleural effusion or confluent pulmonary opacity. No pneumoperitoneum. No acute osseous abnormality identified.  IMPRESSION: No acute cardiopulmonary abnormality.  Original Report Authenticated By: Harley Hallmark, M.D.     1. Nausea and vomiting     Felling better after meds and IV hydration reassured that D-Dimer is negative   MDM  Concerned that she may have a PE as this was symptoms with initial PE several years ago           Arman Filter, NP 03/01/12 0329  Arman Filter, NP 03/01/12 0334  Arman Filter, NP 03/01/12 [phone]

## 2012-03-01 NOTE — ED Provider Notes (Signed)
Medical screening examination/treatment/procedure(s) were performed by non-physician practitioner and as supervising physician I was immediately available for consultation/collaboration.   Lyanne Co, MD 03/01/12 [phone]

## 2012-05-22 ENCOUNTER — Encounter (HOSPITAL_COMMUNITY): Payer: Self-pay | Admitting: Emergency Medicine

## 2012-05-22 ENCOUNTER — Emergency Department (HOSPITAL_COMMUNITY): Payer: Medicaid Other

## 2012-05-22 ENCOUNTER — Emergency Department (HOSPITAL_COMMUNITY)
Admission: EM | Admit: 2012-05-22 | Discharge: 2012-05-22 | Disposition: A | Discharge status: Home / Self Care | Payer: Medicaid Other | Attending: Emergency Medicine | Admitting: Emergency Medicine

## 2012-05-22 DIAGNOSIS — R109 Unspecified abdominal pain: Secondary | ICD-10-CM

## 2012-05-22 DIAGNOSIS — I10 Essential (primary) hypertension: Secondary | ICD-10-CM | POA: Insufficient documentation

## 2012-05-22 DIAGNOSIS — E039 Hypothyroidism, unspecified: Secondary | ICD-10-CM | POA: Insufficient documentation

## 2012-05-22 DIAGNOSIS — R1011 Right upper quadrant pain: Secondary | ICD-10-CM | POA: Insufficient documentation

## 2012-05-22 LAB — CBC WITH DIFFERENTIAL/PLATELET
Basophils Absolute: 0 10*3/uL (ref 0.0–0.1)
HCT: 39 % (ref 36.0–46.0)
Hemoglobin: 14.2 g/dL (ref 12.0–15.0)
Lymphocytes Relative: 43 % (ref 12–46)
Monocytes Absolute: 0.6 10*3/uL (ref 0.1–1.0)
Monocytes Relative: 8 % (ref 3–12)
Neutro Abs: 3.4 10*3/uL (ref 1.7–7.7)
Neutrophils Relative %: 47 % (ref 43–77)
RDW: 13.1 % (ref 11.5–15.5)
WBC: 7.3 10*3/uL (ref 4.0–10.5)

## 2012-05-22 LAB — COMPREHENSIVE METABOLIC PANEL
AST: 18 U/L (ref 0–37)
Alkaline Phosphatase: 184 U/L — ABNORMAL HIGH (ref 39–117)
CO2: 18 mEq/L — ABNORMAL LOW (ref 19–32)
Chloride: 106 mEq/L (ref 96–112)
Creatinine, Ser: 0.44 mg/dL — ABNORMAL LOW (ref 0.50–1.10)
GFR calc non Af Amer: >90 mL/min (ref >90)
Potassium: 3.9 mEq/L (ref 3.5–5.1)
Total Bilirubin: 0.5 mg/dL (ref 0.3–1.2)

## 2012-05-22 LAB — URINALYSIS, ROUTINE W REFLEX MICROSCOPIC
Bilirubin Urine: NEGATIVE
Glucose, UA: NEGATIVE mg/dL
Hgb urine dipstick: NEGATIVE
Ketones, ur: NEGATIVE mg/dL
Nitrite: NEGATIVE
Specific Gravity, Urine: 1.028 (ref 1.005–1.030)
pH: 5.5 (ref 5.0–8.0)

## 2012-05-22 LAB — PREGNANCY, URINE: Preg Test, Ur: NEGATIVE

## 2012-05-22 LAB — PROTIME-INR: INR: 2.05 — ABNORMAL HIGH (ref 0.00–1.49)

## 2012-05-22 NOTE — ED Provider Notes (Signed)
History     CSN: [id number]  Arrival date & time 05/22/12  2130   First MD Initiated Contact with Patient 05/22/12 [phone]      Chief Complaint  Patient presents with  . Abdominal Pain    (Consider location/radiation/quality/duration/timing/severity/associated sxs/prior Treatment)   HPI  The patient presents with increasing right-sided abdominal pain.  She notes that over the past weeks to months she has had similar intermittent episodes.  Over the past 12 hours, the patient developed constant sharp crampy pain in the right upper quadrant.  The pain is nonradiating.  Pain seems worse with by mouth intake.  She also complains of nausea, diarrhea, but denies vomiting, fevers, chills.  She denies any chest pain, dyspnea, confusion, disorientation. Notably, the patient is anticoagulated for a history of PE.    Past Medical History  Diagnosis Date  . Hypertension   . History of blood clots   . Gallstones   . Hypothyroid     Past Surgical History  Procedure Date  . Pilonidal cyst / sinus excision   . Breast surgery     cyst removed from right  . Cesarean section     Family History  Problem Relation Age of Onset  . Hypertension Other   . Hyperlipidemia Other     History  Substance Use Topics  . Smoking status: Never Smoker   . Smokeless tobacco: Not on file  . Alcohol Use: No    OB History    Grav Para Term Preterm Abortions TAB SAB Ect Mult Living                  Review of Systems  Constitutional:       HPI  HENT:       HPI otherwise negative  Eyes: Negative.   Respiratory:       HPI, otherwise negative  Cardiovascular:       HPI, otherwise nmegative  Gastrointestinal: Positive for nausea, abdominal pain and diarrhea. Negative for vomiting, constipation and blood in stool.  Genitourinary:       HPI, otherwise negative  Musculoskeletal:       HPI, otherwise negative  Skin: Negative.   Neurological: Negative for syncope.    Allergies   Penicillins  Home Medications   Current Outpatient Rx  Name Route Sig Dispense Refill  . METHIMAZOLE 10 MG PO TABS Oral Take 10 mg by mouth 3 (three) times daily.    Marland Kitchen METOPROLOL TARTRATE 100 MG PO TABS Oral Take 100 mg by mouth 2 (two) times daily.    Marland Kitchen RIVAROXABAN 20 MG PO TABS Oral Take 20 mg by mouth daily.      BP 129/92  Pulse 74  Temp 97.6 F (36.4 C) (Oral)  Resp 16  Ht 5\' 1"  (1.549 m)  Wt 180 lb (81.647 kg)  BMI 34.01 kg/m2  SpO2 97%  LMP 05/06/2012  Physical Exam  Nursing note and vitals reviewed. Constitutional: She is oriented to person, place, and time. She appears well-developed and well-nourished. No distress.  HENT:  Head: Normocephalic and atraumatic.  Eyes: Conjunctivae normal and EOM are normal.  Cardiovascular: Normal rate and regular rhythm.   Pulmonary/Chest: Effort normal and breath sounds normal. No stridor. No respiratory distress.  Abdominal: Normal appearance and bowel sounds are normal. She exhibits no distension. There is no hepatosplenomegaly. There is tenderness in the right upper quadrant. There is no rigidity, no rebound, no guarding, no CVA tenderness and no tenderness at McBurney's point.  Musculoskeletal:  She exhibits no edema.  Neurological: She is alert and oriented to person, place, and time. No cranial nerve deficit.  Skin: Skin is warm and dry.  Psychiatric: She has a normal mood and affect.    ED Course  Procedures (including critical care time)  Labs Reviewed  CBC WITH DIFFERENTIAL - Abnormal; Notable for the following:    MCHC 36.4 (*)     All other components within normal limits  COMPREHENSIVE METABOLIC PANEL - Abnormal; Notable for the following:    CO2 18 (*)     Creatinine, Ser 0.44 (*)     Albumin 3.4 (*)     Alkaline Phosphatase 184 (*)     All other components within normal limits  URINALYSIS, ROUTINE W REFLEX MICROSCOPIC - Abnormal; Notable for the following:    APPearance CLOUDY (*)     All other components  within normal limits  PROTIME-INR - Abnormal; Notable for the following:    Prothrombin Time 23.5 (*)     INR 2.05 (*)     All other components within normal limits  LIPASE, BLOOD  PREGNANCY, URINE   No results found.   No diagnosis found.   10:20 AM On repeat evaluation the patient is resting, seemingly comfortably.  V-Signs remained unremarkable. MDM  This generally well-appearing female presents with new pain over the past months, worse today.  Given prescription of diarrhea, persistent pain and a prior diagnosis of gallstones there suspicion for hepatobiliary pathology, though given the absence of distress and unremarkable vital signs there is low suspicion for acute process.  The patient's labs and ultrasound were largely reassuring, and on repeat evaluation the patient was much more comfortable-appearing.  This patient is appropriate for discharge with outpatient followup.  We discussed all results at length, discharged in stable condition.    Gerhard Munch, MD 05/22/12 1021

## 2012-05-22 NOTE — ED Notes (Signed)
Pt c/o R sided abd pain, sharp, nausea, diarrhea.

## 2012-05-24 ENCOUNTER — Other Ambulatory Visit: Payer: Self-pay | Admitting: Endocrinology

## 2012-05-24 DIAGNOSIS — E0590 Thyrotoxicosis, unspecified without thyrotoxic crisis or storm: Secondary | ICD-10-CM

## 2012-05-31 ENCOUNTER — Other Ambulatory Visit: Payer: Medicaid Other

## 2012-06-20 ENCOUNTER — Ambulatory Visit
Admission: RE | Admit: 2012-06-20 | Discharge: 2012-06-20 | Disposition: A | Discharge status: Home / Self Care | Payer: Medicaid Other | Source: Ambulatory Visit | Attending: Endocrinology | Admitting: Endocrinology

## 2012-06-20 DIAGNOSIS — E0590 Thyrotoxicosis, unspecified without thyrotoxic crisis or storm: Secondary | ICD-10-CM

## 2012-07-01 ENCOUNTER — Other Ambulatory Visit: Payer: Self-pay | Admitting: Nurse Practitioner

## 2012-07-01 ENCOUNTER — Ambulatory Visit
Admission: RE | Admit: 2012-07-01 | Discharge: 2012-07-01 | Disposition: A | Discharge status: Home / Self Care | Payer: Medicaid Other | Source: Ambulatory Visit | Attending: Nurse Practitioner | Admitting: Nurse Practitioner

## 2012-07-01 DIAGNOSIS — R0781 Pleurodynia: Secondary | ICD-10-CM

## 2012-08-22 ENCOUNTER — Emergency Department (HOSPITAL_COMMUNITY)
Admission: EM | Admit: 2012-08-22 | Discharge: 2012-08-22 | Disposition: A | Discharge status: Home / Self Care | Payer: Medicaid Other | Attending: Emergency Medicine | Admitting: Emergency Medicine

## 2012-08-22 ENCOUNTER — Encounter (HOSPITAL_COMMUNITY): Payer: Self-pay | Admitting: Emergency Medicine

## 2012-08-22 DIAGNOSIS — E079 Disorder of thyroid, unspecified: Secondary | ICD-10-CM | POA: Insufficient documentation

## 2012-08-22 DIAGNOSIS — Z862 Personal history of diseases of the blood and blood-forming organs and certain disorders involving the immune mechanism: Secondary | ICD-10-CM | POA: Insufficient documentation

## 2012-08-22 DIAGNOSIS — J020 Streptococcal pharyngitis: Secondary | ICD-10-CM | POA: Insufficient documentation

## 2012-08-22 DIAGNOSIS — I10 Essential (primary) hypertension: Secondary | ICD-10-CM | POA: Insufficient documentation

## 2012-08-22 DIAGNOSIS — M259 Joint disorder, unspecified: Secondary | ICD-10-CM | POA: Insufficient documentation

## 2012-08-22 DIAGNOSIS — Z79899 Other long term (current) drug therapy: Secondary | ICD-10-CM | POA: Insufficient documentation

## 2012-08-22 DIAGNOSIS — R599 Enlarged lymph nodes, unspecified: Secondary | ICD-10-CM | POA: Insufficient documentation

## 2012-08-22 DIAGNOSIS — Z87442 Personal history of urinary calculi: Secondary | ICD-10-CM | POA: Insufficient documentation

## 2012-08-22 DIAGNOSIS — R110 Nausea: Secondary | ICD-10-CM | POA: Insufficient documentation

## 2012-08-22 LAB — RAPID STREP SCREEN (MED CTR MEBANE ONLY): Streptococcus, Group A Screen (Direct): POSITIVE — AB

## 2012-08-22 MED ORDER — ONDANSETRON 4 MG PO TBDP
4.0000 mg | ORAL_TABLET | Freq: Once | ORAL | Status: AC
  Administered 2012-08-22: 4 mg via ORAL
  Filled 2012-08-22: qty 1

## 2012-08-22 MED ORDER — IBUPROFEN 800 MG PO TABS
800.0000 mg | ORAL_TABLET | Freq: Once | ORAL | Status: AC
  Administered 2012-08-22: 800 mg via ORAL
  Filled 2012-08-22: qty 1

## 2012-08-22 MED ORDER — ONDANSETRON HCL 4 MG PO TABS: 4.0000 mg | ORAL_TABLET | Freq: Four times a day (QID) | ORAL | Status: DC

## 2012-08-22 NOTE — ED Notes (Signed)
Pt c/o sore throat and flu like sxs. Pt has redness and swelling in back of throat. Pain when drinking and eating.VSS.

## 2012-08-22 NOTE — ED Provider Notes (Signed)
History     CSN: [id number]  Arrival date & time 08/22/12  2036   None     Chief Complaint  Patient presents with  . Sore Throat    (Consider location/radiation/quality/duration/timing/severity/associated sxs/prior treatment) Patient is a 31 y.o. female presenting with pharyngitis. The history is provided by the patient. No language interpreter was used.  Sore Throat This is a new problem. The current episode started today. The problem occurs constantly. The problem has been gradually worsening. Associated symptoms include arthralgias, nausea, a sore throat and swollen glands. Pertinent negatives include no abdominal pain, congestion, coughing, fever, headaches, rash or vomiting. The symptoms are aggravated by drinking and swallowing. She has tried nothing for the symptoms.  31 yo female woke with sore throat, body aches, nausea, chills and fever today.  Painful swallowing 8/10 burning throbbing.  She has taken nothing for the pain.  Denies cough vomiting and diarrhea.  No sick contacts.  pmh hypertension, hypothyroid, dvt.   Past Medical History  Diagnosis Date  . Hypertension   . History of blood clots   . Gallstones   . Hypothyroid     Past Surgical History  Procedure Date  . Pilonidal cyst / sinus excision   . Breast surgery     cyst removed from right  . Cesarean section     Family History  Problem Relation Age of Onset  . Hypertension Other   . Hyperlipidemia Other     History  Substance Use Topics  . Smoking status: Never Smoker   . Smokeless tobacco: Not on file  . Alcohol Use: No    OB History    Grav Para Term Preterm Abortions TAB SAB Ect Mult Living                  Review of Systems  Constitutional: Negative.  Negative for fever.  HENT: Positive for sore throat. Negative for ear pain, congestion and rhinorrhea.   Eyes: Negative.   Respiratory: Negative.  Negative for cough, shortness of breath and wheezing.   Cardiovascular: Negative.    Gastrointestinal: Positive for nausea. Negative for vomiting and abdominal pain.  Musculoskeletal: Positive for arthralgias.  Skin: Negative for rash.  Neurological: Negative.  Negative for headaches.  Hematological: Positive for adenopathy.  Psychiatric/Behavioral: Negative.   All other systems reviewed and are negative.    Allergies  Penicillins  Home Medications   Current Outpatient Rx  Name  Route  Sig  Dispense  Refill  . DIPHENHYDRAMINE HCL 25 MG PO TABS   Oral   Take 25 mg by mouth every 6 (six) hours as needed. itching         . METHIMAZOLE 10 MG PO TABS   Oral   Take 10 mg by mouth 3 (three) times daily. 2 tablet in the morning and 2 tablets in the evening         . METOPROLOL TARTRATE 100 MG PO TABS   Oral   Take 100 mg by mouth 2 (two) times daily.         Marland Kitchen RIVAROXABAN 20 MG PO TABS   Oral   Take 20 mg by mouth daily.           BP 157/98  Pulse 107  Temp 99.2 F (37.3 C) (Oral)  Resp 22  SpO2 100%  Physical Exam  Nursing note and vitals reviewed. Constitutional: She is oriented to person, place, and time. She appears well-developed and well-nourished.  HENT:  Head: Normocephalic and  atraumatic.  Right Ear: Tympanic membrane normal.  Left Ear: Tympanic membrane normal.  Nose: Mucosal edema present. No rhinorrhea.  Mouth/Throat: Uvula is midline. Oropharyngeal exudate, posterior oropharyngeal edema and posterior oropharyngeal erythema present.  Eyes: Conjunctivae normal and EOM are normal. Pupils are equal, round, and reactive to light.  Neck: Normal range of motion. Neck supple.  Cardiovascular: Normal heart sounds.  Tachycardia present.   Pulmonary/Chest: Effort normal and breath sounds normal. No respiratory distress. She has no wheezes. She has no rales.  Abdominal: Soft. Bowel sounds are normal. She exhibits no distension. There is no tenderness.  Musculoskeletal: Normal range of motion. She exhibits no edema and no tenderness.   Lymphadenopathy:    She has cervical adenopathy.  Neurological: She is alert and oriented to person, place, and time. She has normal reflexes.  Skin: Skin is warm and dry.  Psychiatric: She has a normal mood and affect.    ED Course  Procedures (including critical care time)  Labs Reviewed  RAPID STREP SCREEN - Abnormal; Notable for the following:    Streptococcus, Group A Screen (Direct) POSITIVE (*)     All other components within normal limits   No results found.   No diagnosis found.    MDM  + strep body aches and fever.  Allergic to pcn.  Rx for azithromycin and zofran/ibuprofen.   No pcp.  Will return to ER for uncontrolled fever,vomiting, diarrhea.  Ready for discharge.          Remi Haggard, NP 08/23/12 1041

## 2012-08-24 NOTE — ED Provider Notes (Signed)
Medical screening examination/treatment/procedure(s) were performed by non-physician practitioner and as supervising physician I was immediately available for consultation/collaboration.  Raeford Razor, MD 08/24/12 [facility]

## 2012-11-21 ENCOUNTER — Emergency Department (HOSPITAL_COMMUNITY): Payer: Medicaid Other

## 2012-11-21 ENCOUNTER — Encounter (HOSPITAL_COMMUNITY): Payer: Self-pay | Admitting: Family Medicine

## 2012-11-21 ENCOUNTER — Emergency Department (HOSPITAL_COMMUNITY)
Admission: EM | Admit: 2012-11-21 | Discharge: 2012-11-21 | Disposition: A | Discharge status: Home / Self Care | Payer: Medicaid Other | Attending: Emergency Medicine | Admitting: Emergency Medicine

## 2012-11-21 DIAGNOSIS — R059 Cough, unspecified: Secondary | ICD-10-CM | POA: Insufficient documentation

## 2012-11-21 DIAGNOSIS — Z79899 Other long term (current) drug therapy: Secondary | ICD-10-CM | POA: Insufficient documentation

## 2012-11-21 DIAGNOSIS — M79609 Pain in unspecified limb: Secondary | ICD-10-CM | POA: Insufficient documentation

## 2012-11-21 DIAGNOSIS — R0602 Shortness of breath: Secondary | ICD-10-CM | POA: Insufficient documentation

## 2012-11-21 DIAGNOSIS — I10 Essential (primary) hypertension: Secondary | ICD-10-CM | POA: Insufficient documentation

## 2012-11-21 DIAGNOSIS — Z8719 Personal history of other diseases of the digestive system: Secondary | ICD-10-CM | POA: Insufficient documentation

## 2012-11-21 DIAGNOSIS — M25473 Effusion, unspecified ankle: Secondary | ICD-10-CM | POA: Insufficient documentation

## 2012-11-21 DIAGNOSIS — Z8639 Personal history of other endocrine, nutritional and metabolic disease: Secondary | ICD-10-CM | POA: Insufficient documentation

## 2012-11-21 DIAGNOSIS — Z86711 Personal history of pulmonary embolism: Secondary | ICD-10-CM | POA: Insufficient documentation

## 2012-11-21 DIAGNOSIS — Z86718 Personal history of other venous thrombosis and embolism: Secondary | ICD-10-CM | POA: Insufficient documentation

## 2012-11-21 DIAGNOSIS — M25476 Effusion, unspecified foot: Secondary | ICD-10-CM | POA: Insufficient documentation

## 2012-11-21 DIAGNOSIS — Z862 Personal history of diseases of the blood and blood-forming organs and certain disorders involving the immune mechanism: Secondary | ICD-10-CM | POA: Insufficient documentation

## 2012-11-21 DIAGNOSIS — R05 Cough: Secondary | ICD-10-CM | POA: Insufficient documentation

## 2012-11-21 DIAGNOSIS — M79604 Pain in right leg: Secondary | ICD-10-CM

## 2012-11-21 HISTORY — DX: Personal history of pulmonary embolism: Z86.711

## 2012-11-21 LAB — CBC WITH DIFFERENTIAL/PLATELET
Basophils Relative: 0 % (ref 0–1)
HCT: 37.3 % (ref 36.0–46.0)
Hemoglobin: 13.2 g/dL (ref 12.0–15.0)
Lymphocytes Relative: 49 % — ABNORMAL HIGH (ref 12–46)
Lymphs Abs: 4 10*3/uL (ref 0.7–4.0)
Monocytes Absolute: 0.6 10*3/uL (ref 0.1–1.0)
Monocytes Relative: 8 % (ref 3–12)
Neutro Abs: 3.5 10*3/uL (ref 1.7–7.7)
Neutrophils Relative %: 43 % (ref 43–77)
RBC: 4.52 MIL/uL (ref 3.87–5.11)
WBC: 8.1 10*3/uL (ref 4.0–10.5)

## 2012-11-21 LAB — BASIC METABOLIC PANEL
BUN: 7 mg/dL (ref 6–23)
CO2: 25 mEq/L (ref 19–32)
Chloride: 102 mEq/L (ref 96–112)
Creatinine, Ser: 0.38 mg/dL — ABNORMAL LOW (ref 0.50–1.10)
GFR calc Af Amer: >90 mL/min (ref >90)
Potassium: 3.7 mEq/L (ref 3.5–5.1)

## 2012-11-21 NOTE — ED Notes (Signed)
Patient states her Right foot began hurting with some swelling on Friday, it has progressed into her ankle and now her calf. Patient reports history of PE in August 2012, a 2nd PE 2 weeks later, and a 3rd PE in February 2013. No swelling noted when comparing L leg to R leg, no difference in temperance of extremities. Pedal pulses present 2+.

## 2012-11-21 NOTE — ED Notes (Signed)
Patient states that her right ankle started swelling Friday night. Reports that she has had shortness of breath with exertion, cough and intermittent chest pain since yesterday. History of PE 2 years ago.

## 2012-11-21 NOTE — ED Notes (Addendum)
Writer entered patients room, asked her to identify herself. After stating her name, patient HR elevated to 147. Patient HR spontaneously returned to 110 after approx 1 minute. VSS at this time. P 110, R 23, SPO2 98 (room air), BP 156/91.

## 2012-11-21 NOTE — ED Provider Notes (Signed)
History     CSN: [id number]  Arrival date & time 11/21/12  4098   First MD Initiated Contact with Patient 11/21/12 2133      Chief Complaint  Patient presents with  . Joint Swelling  . Shortness of Breath    (Consider location/radiation/quality/duration/timing/severity/associated sxs/prior treatment) HPI Comments: [name] is a 32 y.o. Female who presents for evaluation of right ankle and lower leg pain with swelling. Symptoms are gradual in onset, over 3 days. No known trauma. She has mild associated shortness of breath, with occasional cough, productive of clear sputum. No fever, chills, nausea, or vomiting, weakness, or dizziness. She is taking her Xarelto, as treatment for her venous thromboembolism with secondary PE. She is pain with movement of the right leg. No change in her shortness of breath with exertion. There are no other modifying factors.  Patient is a 32 y.o. female presenting with shortness of breath. The history is provided by the patient and a relative.  Shortness of Breath   Past Medical History  Diagnosis Date  . Hypertension   . History of blood clots   . Gallstones   . Hypothyroid   . Graves disease   . History of pulmonary embolus (PE)     Past Surgical History  Procedure Laterality Date  . Pilonidal cyst / sinus excision    . Breast surgery      cyst removed from right  . Cesarean section      Family History  Problem Relation Age of Onset  . Hypertension Other   . Hyperlipidemia Other     History  Substance Use Topics  . Smoking status: Never Smoker   . Smokeless tobacco: Not on file  . Alcohol Use: No    OB History   Grav Para Term Preterm Abortions TAB SAB Ect Mult Living                  Review of Systems  Respiratory: Positive for shortness of breath.   All other systems reviewed and are negative.    Allergies  Penicillins  Home Medications   Current Outpatient Rx  Name  Route  Sig  Dispense  Refill  .  diphenhydrAMINE (BENADRYL) 25 MG tablet   Oral   Take 25 mg by mouth every 6 (six) hours as needed. itching         . methimazole (TAPAZOLE) 10 MG tablet   Oral   Take 20 mg by mouth 2 (two) times daily. 2 tablet in the morning and 2 tablets in the evening         . metoprolol (LOPRESSOR) 100 MG tablet   Oral   Take 100 mg by mouth 2 (two) times daily.         . Rivaroxaban (XARELTO) 20 MG TABS   Oral   Take 20 mg by mouth every evening.            BP 144/87  Pulse 113  Temp(Src) 98.5 F (36.9 C) (Oral)  Resp 19  Ht 5\' 1"  (1.549 m)  Wt 189 lb (85.73 kg)  BMI 35.73 kg/m2  SpO2 98%  LMP 11/07/2012  Physical Exam  Nursing note and vitals reviewed. Constitutional: She is oriented to person, place, and time. She appears well-developed and well-nourished.  HENT:  Head: Normocephalic and atraumatic.  Eyes: Conjunctivae and EOM are normal. Pupils are equal, round, and reactive to light.  Neck: Normal range of motion and phonation normal. Neck supple.  Cardiovascular:  Normal rate, regular rhythm and intact distal pulses.   Pulmonary/Chest: Effort normal and breath sounds normal. She exhibits no tenderness.  Abdominal: Soft. She exhibits no distension. There is no tenderness. There is no guarding.  Musculoskeletal: Normal range of motion.  Right calf 33.5 cm compared with the left is 34.5 - the symptomatic calf is less swollen than the non-symptomatic calf. There is mild tenderness of the right lower leg and ankle without deformity or instability. Intact distal capillary refill, and pedal pulse.  Neurological: She is alert and oriented to person, place, and time. She has normal strength. She exhibits normal muscle tone.  Skin: Skin is warm and dry.  Psychiatric: She has a normal mood and affect. Her behavior is normal. Judgment and thought content normal.    ED Course  Procedures (including critical care time)  Labs Reviewed  CBC WITH DIFFERENTIAL - Abnormal; Notable  for the following:    Lymphocytes Relative 49 (*)    All other components within normal limits  BASIC METABOLIC PANEL - Abnormal; Notable for the following:    Creatinine, Ser 0.38 (*)    All other components within normal limits  D-DIMER, QUANTITATIVE   Dg Chest 2 View  11/21/2012  *RADIOLOGY REPORT*  Clinical Data: Shortness of breath and joint swelling.  CHEST - 2 VIEW  Comparison: 07/01/2012  Findings: The heart size and pulmonary vascularity are normal. The lungs appear clear and expanded without focal air space disease or consolidation. No blunting of the costophrenic angles.  No pneumothorax.  Mediastinal contours appear intact.  No significant change since previous study.  IMPRESSION: No evidence of active pulmonary disease.   Original Report Authenticated By: Burman Nieves, M.D.      1. Leg pain, right       MDM  Nonspecific leg and ankle pain with negative d-dimer and the patient currently receiving treatment with Xarelto. No PTE or active PE. Doubt metabolic instability, serious bacterial infection or impending vascular collapse; the patient is stable for discharge.       Flint Melter, MD 11/22/12 [phone]

## 2012-11-30 ENCOUNTER — Other Ambulatory Visit: Payer: Self-pay | Admitting: Endocrinology

## 2012-11-30 DIAGNOSIS — E0590 Thyrotoxicosis, unspecified without thyrotoxic crisis or storm: Secondary | ICD-10-CM

## 2012-12-13 ENCOUNTER — Encounter (HOSPITAL_COMMUNITY)
Admission: RE | Admit: 2012-12-13 | Discharge: 2012-12-13 | Disposition: A | Discharge status: Home / Self Care | Payer: Medicaid Other | Source: Ambulatory Visit | Attending: Endocrinology | Admitting: Endocrinology

## 2012-12-13 DIAGNOSIS — E0590 Thyrotoxicosis, unspecified without thyrotoxic crisis or storm: Secondary | ICD-10-CM

## 2012-12-14 ENCOUNTER — Encounter (HOSPITAL_COMMUNITY)
Admission: RE | Admit: 2012-12-14 | Discharge: 2012-12-14 | Disposition: A | Discharge status: Home / Self Care | Payer: Medicaid Other | Source: Ambulatory Visit | Attending: Endocrinology | Admitting: Endocrinology

## 2012-12-14 MED ORDER — SODIUM PERTECHNETATE TC 99M INJECTION
10.3000 | Freq: Once | INTRAVENOUS | Status: AC | PRN
  Administered 2012-12-14: 10 via INTRAVENOUS

## 2012-12-14 MED ORDER — SODIUM IODIDE I 131 CAPSULE
9.5000 | Freq: Once | INTRAVENOUS | Status: AC | PRN
  Administered 2012-12-14: 9.5 via ORAL

## 2012-12-19 ENCOUNTER — Other Ambulatory Visit (HOSPITAL_COMMUNITY): Payer: Self-pay | Admitting: Endocrinology

## 2012-12-19 DIAGNOSIS — E0590 Thyrotoxicosis, unspecified without thyrotoxic crisis or storm: Secondary | ICD-10-CM

## 2012-12-29 ENCOUNTER — Ambulatory Visit (HOSPITAL_COMMUNITY): Payer: Medicaid Other

## 2012-12-30 ENCOUNTER — Encounter (HOSPITAL_COMMUNITY)
Admission: RE | Admit: 2012-12-30 | Discharge: 2012-12-30 | Disposition: A | Discharge status: Home / Self Care | Payer: Medicaid Other | Source: Ambulatory Visit | Attending: Endocrinology | Admitting: Endocrinology

## 2012-12-30 DIAGNOSIS — E0590 Thyrotoxicosis, unspecified without thyrotoxic crisis or storm: Secondary | ICD-10-CM | POA: Insufficient documentation

## 2012-12-30 LAB — HCG, SERUM, QUALITATIVE: Preg, Serum: NEGATIVE

## 2012-12-30 MED ORDER — SODIUM IODIDE I 131 CAPSULE
15.2000 | Freq: Once | INTRAVENOUS | Status: AC | PRN
  Administered 2012-12-30: 15.2 via ORAL

## 2013-05-02 ENCOUNTER — Encounter (HOSPITAL_COMMUNITY): Payer: Self-pay

## 2013-05-02 ENCOUNTER — Emergency Department (HOSPITAL_COMMUNITY)
Admission: EM | Admit: 2013-05-02 | Discharge: 2013-05-02 | Disposition: A | Discharge status: Home / Self Care | Payer: Self-pay | Attending: Emergency Medicine | Admitting: Emergency Medicine

## 2013-05-02 DIAGNOSIS — I10 Essential (primary) hypertension: Secondary | ICD-10-CM | POA: Insufficient documentation

## 2013-05-02 DIAGNOSIS — Z880 Allergy status to penicillin: Secondary | ICD-10-CM | POA: Insufficient documentation

## 2013-05-02 DIAGNOSIS — Z862 Personal history of diseases of the blood and blood-forming organs and certain disorders involving the immune mechanism: Secondary | ICD-10-CM | POA: Insufficient documentation

## 2013-05-02 DIAGNOSIS — Z79899 Other long term (current) drug therapy: Secondary | ICD-10-CM | POA: Insufficient documentation

## 2013-05-02 DIAGNOSIS — W4909XA Other specified item causing external constriction, initial encounter: Secondary | ICD-10-CM | POA: Insufficient documentation

## 2013-05-02 DIAGNOSIS — Z86711 Personal history of pulmonary embolism: Secondary | ICD-10-CM | POA: Insufficient documentation

## 2013-05-02 DIAGNOSIS — S60444A External constriction of right ring finger, initial encounter: Secondary | ICD-10-CM

## 2013-05-02 DIAGNOSIS — Z8639 Personal history of other endocrine, nutritional and metabolic disease: Secondary | ICD-10-CM | POA: Insufficient documentation

## 2013-05-02 DIAGNOSIS — Z86718 Personal history of other venous thrombosis and embolism: Secondary | ICD-10-CM | POA: Insufficient documentation

## 2013-05-02 DIAGNOSIS — Z8719 Personal history of other diseases of the digestive system: Secondary | ICD-10-CM | POA: Insufficient documentation

## 2013-05-02 DIAGNOSIS — S60949A Unspecified superficial injury of unspecified finger, initial encounter: Secondary | ICD-10-CM | POA: Insufficient documentation

## 2013-05-02 DIAGNOSIS — Y9389 Activity, other specified: Secondary | ICD-10-CM | POA: Insufficient documentation

## 2013-05-02 DIAGNOSIS — M7989 Other specified soft tissue disorders: Secondary | ICD-10-CM | POA: Insufficient documentation

## 2013-05-02 DIAGNOSIS — Y929 Unspecified place or not applicable: Secondary | ICD-10-CM | POA: Insufficient documentation

## 2013-05-02 MED ORDER — IBUPROFEN 800 MG PO TABS
800.0000 mg | ORAL_TABLET | Freq: Once | ORAL | Status: AC
  Administered 2013-05-02: 800 mg via ORAL
  Filled 2013-05-02: qty 1

## 2013-05-02 NOTE — ED Notes (Signed)
Pt c/o ring stuck on rt ring finger since yesterday, swelling noted

## 2013-05-02 NOTE — Progress Notes (Signed)
[facility] provided patient with a [facility] [employer] application.  °

## 2013-05-02 NOTE — ED Provider Notes (Signed)
CSN: [id number]     Arrival date & time 05/02/13  7829 History   First MD Initiated Contact with Patient 05/02/13 [phone]     Chief Complaint  Patient presents with  . Illegal value: [    ring stuck on rt ring finger   (Consider location/radiation/quality/duration/timing/severity/associated sxs/prior Treatment) HPI 32 year old female presents emergency department for chief complaint of ring stuck on finger.  The patient has a past medical history of hidradenitis. well with surgical excision of the right axillary glands.  She states that she has intermittent swelling of that hand.  She has been wearing a ring on her right ring finger for the past month without problem.  Over the last 24 hour she's had increased swelling in that hand and was unable to roof this morning after using soap and water, hot and cold, Vaseline and other lubricants.  She complains of swelling and pain along with numbness in the finger.    Past Medical History  Diagnosis Date  . Hypertension   . History of blood clots   . Gallstones   . Hypothyroid   . Graves disease   . History of pulmonary embolus (PE)    Past Surgical History  Procedure Laterality Date  . Pilonidal cyst / sinus excision    . Breast surgery      cyst removed from right  . Cesarean section     Family History  Problem Relation Age of Onset  . Hypertension Other   . Hyperlipidemia Other    History  Substance Use Topics  . Smoking status: Never Smoker   . Smokeless tobacco: Not on file  . Alcohol Use: No   OB History   Grav Para Term Preterm Abortions TAB SAB Ect Mult Living                 Review of Systems  Cardiovascular: Negative for chest pain, palpitations and leg swelling.  Gastrointestinal: Negative for nausea.  Musculoskeletal: Positive for joint swelling.  Skin: Negative for color change, pallor, rash and wound.  Neurological: Positive for numbness.    Allergies  Penicillins  Home Medications   Current Outpatient  Rx  Name  Route  Sig  Dispense  Refill  . diphenhydrAMINE (BENADRYL) 25 MG tablet   Oral   Take 25-50 mg by mouth every 6 (six) hours as needed. itching         . methimazole (TAPAZOLE) 10 MG tablet   Oral   Take 20 mg by mouth 2 (two) times daily. 2 tablet in the morning and 2 tablets in the evening         . metoprolol (LOPRESSOR) 100 MG tablet   Oral   Take 100 mg by mouth 2 (two) times daily.         . Rivaroxaban (XARELTO) 20 MG TABS   Oral   Take 20 mg by mouth every evening.           BP 152/110  Pulse 66  Temp(Src) 98 F (36.7 C) (Oral)  Resp 20  SpO2 100%  LMP 04/06/2013 Physical Exam  Constitutional: She is oriented to person, place, and time. She appears well-developed and well-nourished. No distress.  HENT:  Head: Normocephalic and atraumatic.  Eyes: Conjunctivae are normal. No scleral icterus.  Neck: Normal range of motion.  Cardiovascular: Normal rate, regular rhythm and normal heart sounds.  Exam reveals no gallop and no friction rub.   No murmur heard. Pulmonary/Chest: Effort normal and breath sounds normal.  No respiratory distress.  Abdominal: Soft. Bowel sounds are normal. She exhibits no distension and no mass. There is no tenderness. There is no guarding.  Neurological: She is alert and oriented to person, place, and time.  Swelling of the right hand.  The right ring finger is markedly edematous however Refill is less than 3 seconds.  There is redness and indentation at the base of the finger where the ring is sitting.  Patient is able to move the finger however range of motion is limited due to swelling.  Skin: Skin is warm and dry. She is not diaphoretic.    ED Course  Procedures (including critical care time) Labs Review Labs Reviewed - No data to display Imaging Review No results found.  Ring is removed with a ring cutter after attempting shoelace compression technique.  MDM   1. External constriction of right ring finger, initial  encounter    9:30 AM Patient with removal of ring.  She complains fo tightness, pain and numbness.Patient will apply ice and I will reevaluate    9:59 AM BP 152/110  Pulse 66  Temp(Src) 98 F (36.7 C) (Oral)  Resp 20  SpO2 100%  LMP 04/06/2013 Patient continue to c/o pain and tighness. Numbness has resolved. On reexamination her sensation is intact to light touch.  Refill is less then 3 seconds.  The patient is safe to leave spoke with her about return precautions and wheezes intermediate care at the emergency department including signs and symptoms of compartment syndrome.  The patient expresses her understanding and agrees with plan of care.  She will have her finger rechecked with her primary care physician this week.  The patient will be given Advil 800 mg by mouth. The patient appears reasonably screened and/or stabilized for discharge and I doubt any other medical condition or other [facility] requiring further screening, evaluation, or treatment in the ED at this time prior to discharge.   Arthor Captain, PA-C 05/02/13 1011

## 2013-05-05 NOTE — ED Provider Notes (Signed)
Medical screening examination/treatment/procedure(s) were performed by non-physician practitioner and as supervising physician I was immediately available for consultation/collaboration.  Ring was cut off. Exam and perfusion of fingers normal following this.  Claudean Kinds, MD 05/05/13 [phone]

## 2013-05-07 ENCOUNTER — Emergency Department (HOSPITAL_COMMUNITY): Payer: Self-pay

## 2013-05-07 ENCOUNTER — Emergency Department (HOSPITAL_COMMUNITY)
Admission: EM | Admit: 2013-05-07 | Discharge: 2013-05-08 | Disposition: A | Discharge status: Home / Self Care | Payer: Self-pay | Attending: Emergency Medicine | Admitting: Emergency Medicine

## 2013-05-07 ENCOUNTER — Encounter (HOSPITAL_COMMUNITY): Payer: Self-pay | Admitting: *Deleted

## 2013-05-07 DIAGNOSIS — Z3202 Encounter for pregnancy test, result negative: Secondary | ICD-10-CM | POA: Insufficient documentation

## 2013-05-07 DIAGNOSIS — Z86718 Personal history of other venous thrombosis and embolism: Secondary | ICD-10-CM | POA: Insufficient documentation

## 2013-05-07 DIAGNOSIS — Z79899 Other long term (current) drug therapy: Secondary | ICD-10-CM | POA: Insufficient documentation

## 2013-05-07 DIAGNOSIS — R079 Chest pain, unspecified: Secondary | ICD-10-CM | POA: Insufficient documentation

## 2013-05-07 DIAGNOSIS — Z862 Personal history of diseases of the blood and blood-forming organs and certain disorders involving the immune mechanism: Secondary | ICD-10-CM | POA: Insufficient documentation

## 2013-05-07 DIAGNOSIS — Z87442 Personal history of urinary calculi: Secondary | ICD-10-CM | POA: Insufficient documentation

## 2013-05-07 DIAGNOSIS — Z86711 Personal history of pulmonary embolism: Secondary | ICD-10-CM | POA: Insufficient documentation

## 2013-05-07 DIAGNOSIS — R110 Nausea: Secondary | ICD-10-CM | POA: Insufficient documentation

## 2013-05-07 DIAGNOSIS — Z8639 Personal history of other endocrine, nutritional and metabolic disease: Secondary | ICD-10-CM | POA: Insufficient documentation

## 2013-05-07 DIAGNOSIS — I10 Essential (primary) hypertension: Secondary | ICD-10-CM | POA: Insufficient documentation

## 2013-05-07 DIAGNOSIS — Z7901 Long term (current) use of anticoagulants: Secondary | ICD-10-CM | POA: Insufficient documentation

## 2013-05-07 DIAGNOSIS — R0602 Shortness of breath: Secondary | ICD-10-CM | POA: Insufficient documentation

## 2013-05-07 DIAGNOSIS — Z880 Allergy status to penicillin: Secondary | ICD-10-CM | POA: Insufficient documentation

## 2013-05-07 DIAGNOSIS — I252 Old myocardial infarction: Secondary | ICD-10-CM | POA: Insufficient documentation

## 2013-05-07 LAB — BASIC METABOLIC PANEL
BUN: 11 mg/dL (ref 6–23)
CO2: 26 mEq/L (ref 19–32)
GFR calc non Af Amer: 81 mL/min — ABNORMAL LOW (ref >90)
Glucose, Bld: 92 mg/dL (ref 70–99)
Potassium: 3.7 mEq/L (ref 3.5–5.1)

## 2013-05-07 LAB — POCT PREGNANCY, URINE: Preg Test, Ur: NEGATIVE

## 2013-05-07 LAB — CBC
HCT: 39.6 % (ref 36.0–46.0)
Hemoglobin: 14.1 g/dL (ref 12.0–15.0)
MCH: 31.3 pg (ref 26.0–34.0)
MCHC: 35.6 g/dL (ref 30.0–36.0)

## 2013-05-07 LAB — POCT I-STAT TROPONIN I

## 2013-05-07 LAB — PROTIME-INR: INR: 1 (ref 0.00–1.49)

## 2013-05-07 MED ORDER — IOHEXOL 350 MG/ML SOLN
100.0000 mL | Freq: Once | INTRAVENOUS | Status: AC | PRN
  Administered 2013-05-07: 100 mL via INTRAVENOUS

## 2013-05-07 MED ORDER — ASPIRIN 81 MG PO CHEW
324.0000 mg | CHEWABLE_TABLET | Freq: Once | ORAL | Status: AC
  Administered 2013-05-07: 324 mg via ORAL
  Filled 2013-05-07: qty 4

## 2013-05-07 NOTE — ED Notes (Signed)
Pt states it feels like a stabbing pain on left side of chest , hurts to talk , sob and this started at 430 today concerned that it may be another blood clot

## 2013-05-07 NOTE — ED Notes (Signed)
Radiology contacted re results of CT scan

## 2013-05-07 NOTE — ED Notes (Signed)
Pt to CT

## 2013-05-07 NOTE — ED Provider Notes (Signed)
CSN: [id number]     Arrival date & time 05/07/13  1933 History   First MD Initiated Contact with Patient 05/07/13 1959     Chief Complaint  Patient presents with  . Chest Pain  . Shortness of Breath  . Nausea   (Consider location/radiation/quality/duration/timing/severity/associated sxs/prior Treatment) HPI  [name] is a 32 y.o. female with PMH significant for DVT/PE and MI x3 years ago should be anticoagulated with Xarelto, but pt has been taking it every other day to save money. Pt presents today with CP onset at 3 PM lasted 45 minutes felt stabbing, rated as severe associated with SOB. No pain or shortness of breath now. Patient is asymptomatic on arrival to the ED. Patient denies recent immobilization, leg swelling, calf tenderness, calf asymmetry, fever, cough, recent cocaine or methamphetamine abuse, back pain.    Past Medical History  Diagnosis Date  . Hypertension   . History of blood clots   . Gallstones   . Hypothyroid   . Graves disease   . History of pulmonary embolus (PE)    Past Surgical History  Procedure Laterality Date  . Pilonidal cyst / sinus excision    . Breast surgery      cyst removed from right  . Cesarean section     Family History  Problem Relation Age of Onset  . Hypertension Other   . Hyperlipidemia Other    History  Substance Use Topics  . Smoking status: Never Smoker   . Smokeless tobacco: Not on file  . Alcohol Use: No   OB History   Grav Para Term Preterm Abortions TAB SAB Ect Mult Living                 Review of Systems 10 systems reviewed and found to be negative, except as noted in the HPI   Allergies  Penicillins  Home Medications   Current Outpatient Rx  Name  Route  Sig  Dispense  Refill  . diphenhydrAMINE (BENADRYL) 25 MG tablet   Oral   Take 25-50 mg by mouth every 6 (six) hours as needed. itching         . methimazole (TAPAZOLE) 10 MG tablet   Oral   Take 20 mg by mouth 2 (two) times daily. 2 tablet in  the morning and 2 tablets in the evening         . metoprolol (LOPRESSOR) 100 MG tablet   Oral   Take 100 mg by mouth 2 (two) times daily.         . Rivaroxaban (XARELTO) 20 MG TABS   Oral   Take 20 mg by mouth every evening.           BP 174/101  Pulse 45  Temp(Src) 98.9 F (37.2 C) (Oral)  Resp 16  Ht 5' (1.524 m)  Wt 209 lb (94.802 kg)  BMI 40.82 kg/m2  SpO2 97%  LMP 04/06/2013 Physical Exam  Nursing note and vitals reviewed. Constitutional: She is oriented to person, place, and time. She appears well-developed and well-nourished. No distress.  HENT:  Head: Normocephalic.  Mouth/Throat: Oropharynx is clear and moist.  Eyes: Conjunctivae and EOM are normal. Pupils are equal, round, and reactive to light.  Neck: Normal range of motion. Neck supple. No JVD present.  Cardiovascular: Normal rate, regular rhythm and intact distal pulses.   Pulmonary/Chest: Effort normal and breath sounds normal. No stridor. No respiratory distress. She has no wheezes. She has no rales. She exhibits no  tenderness.  Abdominal: Soft. Bowel sounds are normal. She exhibits no distension and no mass. There is no tenderness. There is no rebound and no guarding.  Musculoskeletal: Normal range of motion.  No calf asymmetry, superficial collaterals, palpable cords, edema, Homans sign negative bilaterally.    Neurological: She is alert and oriented to person, place, and time.  Psychiatric: She has a normal mood and affect.    ED Course  Procedures (including critical care time) Labs Review Labs Reviewed  BASIC METABOLIC PANEL - Abnormal; Notable for the following:    GFR calc non Af Amer 81 (*)    All other components within normal limits  CBC  D-DIMER, QUANTITATIVE  PROTIME-INR  POCT I-STAT TROPONIN I  POCT PREGNANCY, URINE   Imaging Review Ct Angio Chest Pe W/cm &/or Wo Cm  05/07/2013   *RADIOLOGY REPORT*  Clinical Data: Standing pain in left chest.  Shortness of breath. History of  pulmonary embolism.  CT ANGIOGRAPHY CHEST  Technique:  Multidetector CT imaging of the chest using the standard protocol during bolus administration of intravenous contrast. Multiplanar reconstructed images including MIPs were obtained and reviewed to evaluate the vascular anatomy.  Contrast: OMNIPAQUE IOHEXOL 350 MG/ML SOLN  Comparison: CTA chest 05/31/2011  Findings: This is a technically very good examination of the pulmonary arterial tree.  The central pulmonary arteries are normal in size.  The pulmonary arteries are well opacified with contrast. No focal filling defects are identified to suggest pulmonary embolism.  Previously seen emboli on the CT chest 05/31/2011 are no longer present.  Heart size is upper normal to mildly prominent stable.  There is some residual thymic tissue anteriorly, that has decreased since prior CT.  The thoracic aorta is normal in caliber and opacification.  Negative for pleural or pericardial effusion. Visualized thyroid gland is unremarkable.  The trachea and mainstem bronchi are patent.  Both lungs are clear, aside from mild dependent atelectasis in the lower lobes and mild linear atelectasis or scarring in the lingula. No evidence of consolidation, edema, or pulmonary mass.  Imaged thoracic spine vertebral bodies are normal in height and alignment.  No acute osseous abnormality.  Imaged portion of the upper abdomen is unremarkable.  IMPRESSION: 1. Technically very good examination of the pulmonary arteries. Negative for pulmonary embolism. 2.  Other than scattered areas of atelectasis, the lungs are clear. No acute findings.   Original Report Authenticated By: Britta Mccreedy, M.D.   Dg Chest Port 1 View  05/07/2013   *RADIOLOGY REPORT*  Clinical Data: Short of breath and weakness  PORTABLE CHEST - 1 VIEW  Comparison: 11/21/2012  Findings: Cardiac enlargement without heart failure.  Lungs are free of infiltrate or effusion.  IMPRESSION: No acute abnormality.   Original  Report Authenticated By: Janeece Riggers, M.D.    Date: 05/07/2013  Rate: 50  Rhythm: sinus bradycardia  QRS Axis: normal  Intervals: normal  ST/T Wave abnormalities: nonspecific T wave changes  Conduction Disutrbances:none  Narrative Interpretation:   Old EKG Reviewed: brady today    MDM   1. Chest pain    Filed Vitals:   05/07/13 1948 05/08/13 0011  BP: 174/101 145/90  Pulse: 45 60  Temp: 98.9 F (37.2 C)   TempSrc: Oral   Resp: 16 16  Height: 5' (1.524 m)   Weight: 209 lb (94.802 kg)   SpO2: 97% 98%     [name] is a 32 y.o. female with past medical history significant for multiple pulmonary embolisms, not  taking her Zaroxolyn so as directed coming in today with chest pain and shortness of breath. Patient also states that she's had a MI 3 years ago, however chart review shows no evidence of this. She does not follow with a cardiologist. Patient is chest pain-free at this time. EKG is nonischemic, she shows a slight bradycardia. Troponin is negative. CT chest also shows no signs of PE. Patient has been observed in the ED with no recurrence of any chest pain or shortness of breath.Discussed case with attending who agrees with plan and stability to d/c to home. I have researched programs to help her with the cost of this are also advised her it is critically important to take as directed daily. Patient has verbalized her understanding and seems reliable for followup. I have asked her to follow with the [facility].   Medications  aspirin chewable tablet 324 mg (324 mg Oral Given 05/07/13 2119)  iohexol (OMNIPAQUE) 350 MG/ML injection 100 mL (100 mLs Intravenous Contrast Given 05/07/13 2107)   Pt is hemodynamically stable, appropriate for, and amenable to discharge at this time. Pt verbalized understanding and agrees with care plan. All questions answered. Outpatient follow-up and specific return precautions discussed.    Note: Portions of this report may  have been transcribed using voice recognition software. Every effort was made to ensure accuracy; however, inadvertent computerized transcription errors may be present      Wynetta Emery, PA-C 05/08/13 1504

## 2013-05-07 NOTE — ED Notes (Signed)
X-ray at bedside

## 2013-05-08 NOTE — ED Provider Notes (Signed)
Medical screening examination/treatment/procedure(s) were performed by non-physician practitioner and as supervising physician I was immediately available for consultation/collaboration.  Maurisa Tesmer L Ingri Diemer, MD 05/08/13 1938 

## 2013-05-26 ENCOUNTER — Ambulatory Visit: Payer: Medicaid Other | Admitting: Internal Medicine

## 2013-06-18 ENCOUNTER — Emergency Department (HOSPITAL_COMMUNITY)
Admission: EM | Admit: 2013-06-18 | Discharge: 2013-06-18 | Disposition: A | Discharge status: Home / Self Care | Payer: Self-pay | Attending: Emergency Medicine | Admitting: Emergency Medicine

## 2013-06-18 ENCOUNTER — Encounter (HOSPITAL_COMMUNITY): Payer: Self-pay | Admitting: Emergency Medicine

## 2013-06-18 ENCOUNTER — Emergency Department (HOSPITAL_COMMUNITY): Payer: Medicaid Other

## 2013-06-18 DIAGNOSIS — Z79899 Other long term (current) drug therapy: Secondary | ICD-10-CM | POA: Insufficient documentation

## 2013-06-18 DIAGNOSIS — R1032 Left lower quadrant pain: Secondary | ICD-10-CM | POA: Insufficient documentation

## 2013-06-18 DIAGNOSIS — R109 Unspecified abdominal pain: Secondary | ICD-10-CM

## 2013-06-18 DIAGNOSIS — E669 Obesity, unspecified: Secondary | ICD-10-CM | POA: Insufficient documentation

## 2013-06-18 DIAGNOSIS — I10 Essential (primary) hypertension: Secondary | ICD-10-CM | POA: Insufficient documentation

## 2013-06-18 DIAGNOSIS — Z86711 Personal history of pulmonary embolism: Secondary | ICD-10-CM | POA: Insufficient documentation

## 2013-06-18 DIAGNOSIS — Z3202 Encounter for pregnancy test, result negative: Secondary | ICD-10-CM | POA: Insufficient documentation

## 2013-06-18 DIAGNOSIS — Z7901 Long term (current) use of anticoagulants: Secondary | ICD-10-CM | POA: Insufficient documentation

## 2013-06-18 DIAGNOSIS — N898 Other specified noninflammatory disorders of vagina: Secondary | ICD-10-CM | POA: Insufficient documentation

## 2013-06-18 DIAGNOSIS — R112 Nausea with vomiting, unspecified: Secondary | ICD-10-CM | POA: Insufficient documentation

## 2013-06-18 DIAGNOSIS — Z8719 Personal history of other diseases of the digestive system: Secondary | ICD-10-CM | POA: Insufficient documentation

## 2013-06-18 DIAGNOSIS — Z880 Allergy status to penicillin: Secondary | ICD-10-CM | POA: Insufficient documentation

## 2013-06-18 DIAGNOSIS — Z86718 Personal history of other venous thrombosis and embolism: Secondary | ICD-10-CM | POA: Insufficient documentation

## 2013-06-18 DIAGNOSIS — Z8619 Personal history of other infectious and parasitic diseases: Secondary | ICD-10-CM | POA: Insufficient documentation

## 2013-06-18 DIAGNOSIS — R300 Dysuria: Secondary | ICD-10-CM | POA: Insufficient documentation

## 2013-06-18 LAB — CBC
Hemoglobin: 14.2 g/dL (ref 12.0–15.0)
MCH: 32.3 pg (ref 26.0–34.0)
Platelets: 369 10*3/uL (ref 150–400)
RBC: 4.39 MIL/uL (ref 3.87–5.11)
WBC: 5.7 10*3/uL (ref 4.0–10.5)

## 2013-06-18 LAB — COMPREHENSIVE METABOLIC PANEL
ALT: 14 U/L (ref 0–35)
AST: 22 U/L (ref 0–37)
Alkaline Phosphatase: 170 U/L — ABNORMAL HIGH (ref 39–117)
CO2: 27 mEq/L (ref 19–32)
Calcium: 9.2 mg/dL (ref 8.4–10.5)
Chloride: 101 mEq/L (ref 96–112)
GFR calc Af Amer: >90 mL/min (ref >90)
GFR calc non Af Amer: >90 mL/min (ref >90)
Glucose, Bld: 88 mg/dL (ref 70–99)
Potassium: 3.6 mEq/L (ref 3.5–5.1)
Sodium: 137 mEq/L (ref 135–145)

## 2013-06-18 LAB — WET PREP, GENITAL
Clue Cells Wet Prep HPF POC: NONE SEEN
Trich, Wet Prep: NONE SEEN

## 2013-06-18 LAB — URINALYSIS, ROUTINE W REFLEX MICROSCOPIC
Bilirubin Urine: NEGATIVE
Hgb urine dipstick: NEGATIVE
Ketones, ur: NEGATIVE mg/dL
Specific Gravity, Urine: 1.027 (ref 1.005–1.030)
Urobilinogen, UA: 0.2 mg/dL (ref 0.0–1.0)

## 2013-06-18 LAB — POCT PREGNANCY, URINE: Preg Test, Ur: NEGATIVE

## 2013-06-18 MED ORDER — KETOROLAC TROMETHAMINE 30 MG/ML IJ SOLN
30.0000 mg | Freq: Once | INTRAMUSCULAR | Status: AC
  Administered 2013-06-18: 30 mg via INTRAVENOUS
  Filled 2013-06-18: qty 1

## 2013-06-18 MED ORDER — ONDANSETRON HCL 4 MG/2ML IJ SOLN
4.0000 mg | Freq: Once | INTRAMUSCULAR | Status: AC
  Administered 2013-06-18: 4 mg via INTRAVENOUS
  Filled 2013-06-18: qty 2

## 2013-06-18 NOTE — ED Provider Notes (Signed)
Medical screening examination/treatment/procedure(s) were performed by non-physician practitioner and as supervising physician I was immediately available for consultation/collaboration.  Megan E Docherty, MD 06/18/13 2232 

## 2013-06-18 NOTE — ED Notes (Signed)
PT from home w/ c/o sharp abdominal pains that radiate to the back starting today. Pt had an episode of vomiting.

## 2013-06-18 NOTE — ED Provider Notes (Signed)
CSN: [id number]     Arrival date & time 06/18/13  1623 History   First MD Initiated Contact with Patient 06/18/13 1626     Chief Complaint  Patient presents with  . Abdominal Pain   (Consider location/radiation/quality/duration/timing/severity/associated sxs/prior Treatment) HPI Comments: 32 year old female with a past medical history of hypertension, DVT, PE, gallstones and hypothyroid disease presents to the emergency department complaining of sudden onset abdominal pain beginning about 30 minutes prior to arrival. Patient states she was sitting on the couch at home and she developed a sharp pain in the center of her lower abdomen radiating around to her back on both sides. Pain has been constant since, worsening at random rated 10 out of 10. She has not tried any alleviating factors. Admits to associated nausea and one episode of vomiting in the parking lot before walking into the emergency department. States she is currently  Treating herself for a self diagnosed yeast infection with over-the-counter medications as of today. States she has white vaginal discharge, dysuria and irritation. Denies vaginal bleeding. Last menstrual period was one month ago and was lighter than normal.  She is not on any form of birth control. Admits to stress incontinence when she vomited outside today. Denies increased urinary frequency or urgency.  Patient is a 32 y.o. female presenting with abdominal pain. The history is provided by the patient and the spouse.  Abdominal Pain Associated symptoms: dysuria, nausea, vaginal discharge and vomiting   Associated symptoms: no chest pain, no chills, no constipation, no diarrhea, no fever, no shortness of breath and no vaginal bleeding     Past Medical History  Diagnosis Date  . Hypertension   . History of blood clots   . Gallstones   . Hypothyroid   . Graves disease   . History of pulmonary embolus (PE)    Past Surgical History  Procedure Laterality Date  .  Pilonidal cyst / sinus excision    . Breast surgery      cyst removed from right  . Cesarean section     Family History  Problem Relation Age of Onset  . Hypertension Other   . Hyperlipidemia Other    History  Substance Use Topics  . Smoking status: Never Smoker   . Smokeless tobacco: Not on file  . Alcohol Use: No   OB History   Grav Para Term Preterm Abortions TAB SAB Ect Mult Living                 Review of Systems  Constitutional: Negative for fever and chills.  Respiratory: Negative for shortness of breath.   Cardiovascular: Negative for chest pain.  Gastrointestinal: Positive for nausea, vomiting and abdominal pain. Negative for diarrhea and constipation.  Genitourinary: Positive for dysuria, vaginal discharge and menstrual problem. Negative for urgency and vaginal bleeding.  All other systems reviewed and are negative.    Allergies  Penicillins  Home Medications   Current Outpatient Rx  Name  Route  Sig  Dispense  Refill  . diphenhydrAMINE (BENADRYL) 25 MG tablet   Oral   Take 25-50 mg by mouth every 6 (six) hours as needed. itching         . methimazole (TAPAZOLE) 10 MG tablet   Oral   Take 20 mg by mouth 2 (two) times daily. 2 tablet in the morning and 2 tablets in the evening         . metoprolol (LOPRESSOR) 100 MG tablet   Oral   Take 100  mg by mouth 2 (two) times daily.         . Rivaroxaban (XARELTO) 20 MG TABS   Oral   Take 20 mg by mouth every evening.           BP 170/111  Pulse 85  Resp 18  SpO2 99% Physical Exam  Nursing note and vitals reviewed. Constitutional: She is oriented to person, place, and time. She appears well-developed and well-nourished. No distress.  Obese  HENT:  Head: Normocephalic and atraumatic.  Mouth/Throat: Oropharynx is clear and moist.  Eyes: Conjunctivae are normal.  Neck: Normal range of motion. Neck supple.  Cardiovascular: Normal rate, regular rhythm and normal heart sounds.   Pulmonary/Chest:  Effort normal and breath sounds normal.  Abdominal: Soft. Normal appearance and bowel sounds are normal. She exhibits no distension and no mass. There is tenderness in the right lower quadrant, suprapubic area and left lower quadrant. There is no rigidity, no rebound, no guarding and no CVA tenderness.  No peritoneal signs.  Genitourinary: Uterus normal. There is no rash, tenderness or lesion on the right labia. There is no rash, tenderness or lesion on the left labia. Cervix exhibits no motion tenderness, no discharge and no friability. Right adnexum displays no mass, no tenderness and no fullness. Left adnexum displays no mass, no tenderness and no fullness. No erythema, tenderness or bleeding around the vagina. No vaginal discharge found.  Musculoskeletal: Normal range of motion. She exhibits no edema.  Neurological: She is alert and oriented to person, place, and time.  Skin: Skin is warm and dry. She is not diaphoretic.  Psychiatric: She has a normal mood and affect. Her behavior is normal.    ED Course  Procedures (including critical care time) Labs Review Labs Reviewed  COMPREHENSIVE METABOLIC PANEL - Abnormal; Notable for the following:    Alkaline Phosphatase 170 (*)    All other components within normal limits  WET PREP, GENITAL  GC/CHLAMYDIA PROBE AMP  CBC  URINALYSIS, ROUTINE W REFLEX MICROSCOPIC  POCT PREGNANCY, URINE   Imaging Review US Abdomen Complete  06/18/2013   CLINICAL DATA:  Abdominal pain.  EXAM: ULTRASOUND ABDOMEN COMPLETE  COMPARISON:  05/22/2012.  FINDINGS: Gallbladder  No gallstones or wall thickening. Negative sonographic Murphy's sign.  Common bile duct  Diameter: 3.6 mm  Liver  No focal lesion identified. Within normal limits in parenchymal echogenicity.  IVC  No abnormality visualized.  Pancreas  Visualized portion unremarkable.  Spleen  Size and appearance within normal limits.  Right Kidney  Length: 10.3 cm Echogenicity within normal limits. No mass or  hydronephrosis visualized. No shadowing calculi.  Left Kidney  Length: 10.9 cm Echogenicity within normal limits. No mass or hydronephrosis visualized. No shadowing calculi.  Abdominal aorta  No aneurysm visualized.  IMPRESSION: Negative abdominal ultrasound. No change from prior normal study.   Electronically Signed   By: Davonna Belling M.D.   On: 06/18/2013 18:59    EKG Interpretation   None       MDM   1. Abdominal pain      Patient with sudden onset abdominal pain radiating to her back. Currently nauseated. She is treating herself currently for a yeast infection due to vaginal discharge and irritation. Her abdominal pain may be pelvic in nature vs a kidney stone vs gallbladder pathology due to hx of gallstones, no RUQ Pain. Labs pending- cbc, cmp, ua, wet prep, gc/chlamydia. Toradol, zofran. 5:40 PM Patient reports her pain decreased to 3/10 from 10/10 with toradol.  7:24 PM Abdominal ultrasound obtained due elevated alkaline phosphatase and history of gallstones, abdominal ultrasound unremarkable. Labs are all normal otherwise, wet prep normal. I do not feel any further imaging is necessary at this time. Leukocytosis. Abdomen is soft, nontender and nondistended. She is stable for discharge home. Return precautions discussed. Patient states understanding of plan and is agreeable.  Trevor Mace, PA-C 06/18/13 1927

## 2013-06-19 LAB — GC/CHLAMYDIA PROBE AMP
CT Probe RNA: NEGATIVE
GC Probe RNA: NEGATIVE

## 2013-10-01 ENCOUNTER — Emergency Department (HOSPITAL_COMMUNITY)
Admission: EM | Admit: 2013-10-01 | Discharge: 2013-10-01 | Disposition: A | Discharge status: Home / Self Care | Payer: 59 | Attending: Emergency Medicine | Admitting: Emergency Medicine

## 2013-10-01 ENCOUNTER — Encounter (HOSPITAL_COMMUNITY): Payer: Self-pay | Admitting: Emergency Medicine

## 2013-10-01 DIAGNOSIS — R197 Diarrhea, unspecified: Secondary | ICD-10-CM | POA: Insufficient documentation

## 2013-10-01 DIAGNOSIS — Z3202 Encounter for pregnancy test, result negative: Secondary | ICD-10-CM | POA: Insufficient documentation

## 2013-10-01 DIAGNOSIS — Z7901 Long term (current) use of anticoagulants: Secondary | ICD-10-CM | POA: Insufficient documentation

## 2013-10-01 DIAGNOSIS — Z86718 Personal history of other venous thrombosis and embolism: Secondary | ICD-10-CM | POA: Insufficient documentation

## 2013-10-01 DIAGNOSIS — I10 Essential (primary) hypertension: Secondary | ICD-10-CM | POA: Insufficient documentation

## 2013-10-01 DIAGNOSIS — R1084 Generalized abdominal pain: Secondary | ICD-10-CM | POA: Insufficient documentation

## 2013-10-01 DIAGNOSIS — Z862 Personal history of diseases of the blood and blood-forming organs and certain disorders involving the immune mechanism: Secondary | ICD-10-CM | POA: Insufficient documentation

## 2013-10-01 DIAGNOSIS — Z8719 Personal history of other diseases of the digestive system: Secondary | ICD-10-CM | POA: Insufficient documentation

## 2013-10-01 DIAGNOSIS — Z8639 Personal history of other endocrine, nutritional and metabolic disease: Secondary | ICD-10-CM | POA: Insufficient documentation

## 2013-10-01 DIAGNOSIS — Z880 Allergy status to penicillin: Secondary | ICD-10-CM | POA: Insufficient documentation

## 2013-10-01 DIAGNOSIS — Z79899 Other long term (current) drug therapy: Secondary | ICD-10-CM | POA: Insufficient documentation

## 2013-10-01 LAB — CBC WITH DIFFERENTIAL/PLATELET
BASOS ABS: 0 10*3/uL (ref 0.0–0.1)
Basophils Relative: 0 % (ref 0–1)
EOS PCT: 4 % (ref 0–5)
Eosinophils Absolute: 0.2 10*3/uL (ref 0.0–0.7)
HEMATOCRIT: 38.8 % (ref 36.0–46.0)
HEMOGLOBIN: 13.9 g/dL (ref 12.0–15.0)
LYMPHS ABS: 2.1 10*3/uL (ref 0.7–4.0)
LYMPHS PCT: 40 % (ref 12–46)
MCH: 32.7 pg (ref 26.0–34.0)
MCHC: 35.8 g/dL (ref 30.0–36.0)
MCV: 91.3 fL (ref 78.0–100.0)
MONO ABS: 0.3 10*3/uL (ref 0.1–1.0)
MONOS PCT: 6 % (ref 3–12)
Neutro Abs: 2.7 10*3/uL (ref 1.7–7.7)
Neutrophils Relative %: 51 % (ref 43–77)
Platelets: 379 10*3/uL (ref 150–400)
RBC: 4.25 MIL/uL (ref 3.87–5.11)
RDW: 12 % (ref 11.5–15.5)
WBC: 5.2 10*3/uL (ref 4.0–10.5)

## 2013-10-01 LAB — URINALYSIS, ROUTINE W REFLEX MICROSCOPIC
Bilirubin Urine: NEGATIVE
Glucose, UA: NEGATIVE mg/dL
Hgb urine dipstick: NEGATIVE
KETONES UR: NEGATIVE mg/dL
LEUKOCYTES UA: NEGATIVE
Nitrite: NEGATIVE
PROTEIN: NEGATIVE mg/dL
Specific Gravity, Urine: 1.031 — ABNORMAL HIGH (ref 1.005–1.030)
UROBILINOGEN UA: 0.2 mg/dL (ref 0.0–1.0)
pH: 5.5 (ref 5.0–8.0)

## 2013-10-01 LAB — COMPREHENSIVE METABOLIC PANEL
ALT: 11 U/L (ref 0–35)
AST: 15 U/L (ref 0–37)
Albumin: 3.9 g/dL (ref 3.5–5.2)
Alkaline Phosphatase: 111 U/L (ref 39–117)
BILIRUBIN TOTAL: 0.6 mg/dL (ref 0.3–1.2)
BUN: 8 mg/dL (ref 6–23)
CALCIUM: 8.6 mg/dL (ref 8.4–10.5)
CHLORIDE: 104 meq/L (ref 96–112)
CO2: 25 meq/L (ref 19–32)
CREATININE: 0.89 mg/dL (ref 0.50–1.10)
GFR, EST NON AFRICAN AMERICAN: 85 mL/min — AB (ref >90)
GLUCOSE: 84 mg/dL (ref 70–99)
Potassium: 3.8 mEq/L (ref 3.7–5.3)
Sodium: 142 mEq/L (ref 137–147)
Total Protein: 7.7 g/dL (ref 6.0–8.3)

## 2013-10-01 LAB — LIPASE, BLOOD: LIPASE: 10 U/L — AB (ref 11–59)

## 2013-10-01 LAB — POCT PREGNANCY, URINE: PREG TEST UR: NEGATIVE

## 2013-10-01 MED ORDER — DIPHENOXYLATE-ATROPINE 2.5-0.025 MG PO TABS: 2.0000 | ORAL_TABLET | Freq: Four times a day (QID) | ORAL | Status: DC | PRN

## 2013-10-01 MED ORDER — LOPERAMIDE HCL 2 MG PO CAPS
2.0000 mg | ORAL_CAPSULE | ORAL | Status: DC | PRN
  Administered 2013-10-01: 2 mg via ORAL
  Filled 2013-10-01: qty 1

## 2013-10-01 NOTE — ED Notes (Signed)
Pt c/o abd bloating, gen abd pain since yesterday. States she has had about 16 episodes of diarrhea.

## 2013-10-01 NOTE — ED Notes (Signed)
Pt resting on stretcher, watching TV.  Call bell in reach, pt denies needs/complaints at this time, nad, awaiting dispo.

## 2013-10-01 NOTE — Discharge Instructions (Signed)
Diarrhea Diarrhea is frequent loose and watery bowel movements. It can cause you to feel weak and dehydrated. Dehydration can cause you to become tired and thirsty, have a dry mouth, and have decreased urination that often is dark yellow. Diarrhea is a sign of another problem, most often an infection that will not last long. In most cases, diarrhea typically lasts 2 3 days. However, it can last longer if it is a sign of something more serious. It is important to treat your diarrhea as directed by your caregive to lessen or prevent future episodes of diarrhea. CAUSES  Some common causes include:  Gastrointestinal infections caused by viruses, bacteria, or parasites.  Food poisoning or food allergies.  Certain medicines, such as antibiotics, chemotherapy, and laxatives.  Artificial sweeteners and fructose.  Digestive disorders. HOME CARE INSTRUCTIONS  Ensure adequate fluid intake (hydration): have 1 cup (8 oz) of fluid for each diarrhea episode. Avoid fluids that contain simple sugars or sports drinks, fruit juices, whole milk products, and sodas. Your urine should be clear or pale yellow if you are drinking enough fluids. Hydrate with an oral rehydration solution that you can purchase at pharmacies, retail stores, and online. You can prepare an oral rehydration solution at home by mixing the following ingredients together:    tsp table salt.   tsp baking soda.   tsp salt substitute containing potassium chloride.  1  tablespoons sugar.  1 L (34 oz) of water.  Certain foods and beverages may increase the speed at which food moves through the gastrointestinal (GI) tract. These foods and beverages should be avoided and include:  Caffeinated and alcoholic beverages.  High-fiber foods, such as raw fruits and vegetables, nuts, seeds, and whole grain breads and cereals.  Foods and beverages sweetened with sugar alcohols, such as xylitol, sorbitol, and mannitol.  Some foods may be well  tolerated and may help thicken stool including:  Starchy foods, such as rice, toast, pasta, low-sugar cereal, oatmeal, grits, baked potatoes, crackers, and bagels.  Bananas.  Applesauce.  Add probiotic-rich foods to help increase healthy bacteria in the GI tract, such as yogurt and fermented milk products.  Wash your hands well after each diarrhea episode.  Only take over-the-counter or prescription medicines as directed by your caregiver.  Take a warm bath to relieve any burning or pain from frequent diarrhea episodes. SEEK IMMEDIATE MEDICAL CARE IF:   You are unable to keep fluids down.  You have persistent vomiting.  You have blood in your stool, or your stools are black and tarry.  You do not urinate in 6 8 hours, or there is only a small amount of very dark urine.  You have abdominal pain that increases or localizes.  You have weakness, dizziness, confusion, or lightheadedness.  You have a severe headache.  Your diarrhea gets worse or does not get better.  You have a fever or persistent symptoms for more than 2 3 days.  You have a fever and your symptoms suddenly get worse. MAKE SURE YOU:   Understand these instructions.  Will watch your condition.  Will get help right away if you are not doing well or get worse. Document Released: 08/14/2002 Document Revised: 08/10/2012 Document Reviewed: 05/01/2012 [employer] Patient Information 2014 [employer].  

## 2013-10-01 NOTE — ED Provider Notes (Signed)
CSN: [id number]     Arrival date & time 10/01/13  1147 History   First MD Initiated Contact with Patient 10/01/13 1219     Chief Complaint  Patient presents with  . Diarrhea  . Abdominal Pain   (Consider location/radiation/quality/duration/timing/severity/associated sxs/prior Treatment) Patient is a 33 y.o. female presenting with diarrhea and abdominal pain. The history is provided by the patient.  Diarrhea Quality:  Watery Severity:  Moderate Onset quality:  Sudden Duration:  1 day Timing:  Constant Progression:  Worsening Relieved by:  Nothing Worsened by:  Nothing tried Ineffective treatments:  None tried Associated symptoms: abdominal pain   Risk factors: no recent antibiotic use and no sick contacts   Abdominal Pain Associated symptoms: diarrhea    patient here with watery diarrhea x24 hours. No recent antibiotic use. No fever or chills. No vomiting. Describes diffuse abdominal cramping. Patient denies any recent travel history. No URI symptoms. No sick exposures. Symptoms are persistent and associated with some bloating. No treatment used prior to arrival. Denies any vaginal bleeding or discharge. No urinary symptoms.  Past Medical History  Diagnosis Date  . Hypertension   . History of blood clots   . Gallstones   . Hypothyroid   . Graves disease   . History of pulmonary embolus (PE)    Past Surgical History  Procedure Laterality Date  . Pilonidal cyst / sinus excision    . Breast surgery      cyst removed from right  . Cesarean section     Family History  Problem Relation Age of Onset  . Hypertension Other   . Hyperlipidemia Other    History  Substance Use Topics  . Smoking status: Never Smoker   . Smokeless tobacco: Not on file  . Alcohol Use: No   OB History   Grav Para Term Preterm Abortions TAB SAB Ect Mult Living                 Review of Systems  Gastrointestinal: Positive for abdominal pain and diarrhea.  All other systems reviewed and are  negative.    Allergies  Penicillins  Home Medications   Current Outpatient Rx  Name  Route  Sig  Dispense  Refill  . diphenhydrAMINE (BENADRYL) 25 MG tablet   Oral   Take 25-50 mg by mouth every 6 (six) hours as needed. itching         . methimazole (TAPAZOLE) 10 MG tablet   Oral   Take 20 mg by mouth 2 (two) times daily. 2 tablet in the morning and 2 tablets in the evening         . metoprolol (LOPRESSOR) 100 MG tablet   Oral   Take 100 mg by mouth 2 (two) times daily.         . Rivaroxaban (XARELTO) 20 MG TABS   Oral   Take 20 mg by mouth every evening.           BP 110/81  Pulse 70  Temp(Src) 97.7 F (36.5 C) (Oral)  Resp 17  SpO2 94%  LMP 10/01/2012 Physical Exam  Nursing note and vitals reviewed. Constitutional: She is oriented to person, place, and time. She appears well-developed and well-nourished.  Non-toxic appearance. No distress.  HENT:  Head: Normocephalic and atraumatic.  Eyes: Conjunctivae, EOM and lids are normal. Pupils are equal, round, and reactive to light.  Neck: Normal range of motion. Neck supple. No tracheal deviation present. No mass present.  Cardiovascular: Normal rate, regular  rhythm and normal heart sounds.  Exam reveals no gallop.   No murmur heard. Pulmonary/Chest: Effort normal and breath sounds normal. No stridor. No respiratory distress. She has no decreased breath sounds. She has no wheezes. She has no rhonchi. She has no rales.  Abdominal: Soft. Normal appearance and bowel sounds are normal. She exhibits no distension. There is no tenderness. There is no rebound and no CVA tenderness.  Musculoskeletal: Normal range of motion. She exhibits no edema and no tenderness.  Neurological: She is alert and oriented to person, place, and time. She has normal strength. No cranial nerve deficit or sensory deficit. GCS eye subscore is 4. GCS verbal subscore is 5. GCS motor subscore is 6.  Skin: Skin is warm and dry. No abrasion and no  rash noted.  Psychiatric: She has a normal mood and affect. Her speech is normal and behavior is normal.    ED Course  Procedures (including critical care time) Labs Review Labs Reviewed  CBC WITH DIFFERENTIAL  COMPREHENSIVE METABOLIC PANEL  LIPASE, BLOOD  URINALYSIS, ROUTINE W REFLEX MICROSCOPIC   Imaging Review No results found.  EKG Interpretation   None       MDM  No diagnosis found. Patient given IV fluids here and medications for diarrhea. Patient to place an antimotility agents and discharged    Leota Jacobsen, MD 10/01/13 1434

## 2013-10-01 NOTE — ED Notes (Signed)
Patient states unable to void at this time.

## 2014-11-26 ENCOUNTER — Institutional Professional Consult (permissible substitution): Payer: Self-pay | Admitting: Neurology

## 2014-11-27 ENCOUNTER — Encounter: Payer: Self-pay | Admitting: Neurology

## 2014-12-19 ENCOUNTER — Encounter (HOSPITAL_COMMUNITY): Payer: Self-pay | Admitting: Emergency Medicine

## 2014-12-19 ENCOUNTER — Emergency Department (HOSPITAL_COMMUNITY): Payer: 59

## 2014-12-19 ENCOUNTER — Emergency Department (HOSPITAL_COMMUNITY)
Admission: EM | Admit: 2014-12-19 | Discharge: 2014-12-19 | Disposition: A | Discharge status: Home / Self Care | Payer: 59 | Attending: Emergency Medicine | Admitting: Emergency Medicine

## 2014-12-19 DIAGNOSIS — E079 Disorder of thyroid, unspecified: Secondary | ICD-10-CM | POA: Diagnosis not present

## 2014-12-19 DIAGNOSIS — Z86718 Personal history of other venous thrombosis and embolism: Secondary | ICD-10-CM | POA: Diagnosis not present

## 2014-12-19 DIAGNOSIS — R519 Headache, unspecified: Secondary | ICD-10-CM

## 2014-12-19 DIAGNOSIS — Z79899 Other long term (current) drug therapy: Secondary | ICD-10-CM | POA: Diagnosis not present

## 2014-12-19 DIAGNOSIS — Y998 Other external cause status: Secondary | ICD-10-CM | POA: Insufficient documentation

## 2014-12-19 DIAGNOSIS — R51 Headache: Secondary | ICD-10-CM

## 2014-12-19 DIAGNOSIS — Y9389 Activity, other specified: Secondary | ICD-10-CM | POA: Diagnosis not present

## 2014-12-19 DIAGNOSIS — Y9289 Other specified places as the place of occurrence of the external cause: Secondary | ICD-10-CM | POA: Insufficient documentation

## 2014-12-19 DIAGNOSIS — Z86711 Personal history of pulmonary embolism: Secondary | ICD-10-CM | POA: Insufficient documentation

## 2014-12-19 DIAGNOSIS — Z880 Allergy status to penicillin: Secondary | ICD-10-CM | POA: Diagnosis not present

## 2014-12-19 DIAGNOSIS — S0990XA Unspecified injury of head, initial encounter: Secondary | ICD-10-CM | POA: Insufficient documentation

## 2014-12-19 DIAGNOSIS — I10 Essential (primary) hypertension: Secondary | ICD-10-CM | POA: Diagnosis not present

## 2014-12-19 DIAGNOSIS — W228XXA Striking against or struck by other objects, initial encounter: Secondary | ICD-10-CM | POA: Insufficient documentation

## 2014-12-19 MED ORDER — TRAMADOL HCL 50 MG PO TABS: ORAL_TABLET | ORAL | Status: DC
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
## 2014-12-19 NOTE — Discharge Instructions (Signed)
Follow up with your md if not improving. °

## 2015-08-24 ENCOUNTER — Emergency Department (HOSPITAL_COMMUNITY): Payer: 59

## 2015-08-24 ENCOUNTER — Emergency Department (HOSPITAL_COMMUNITY)
Admission: EM | Admit: 2015-08-24 | Discharge: 2015-08-24 | Disposition: A | Discharge status: Home / Self Care | Payer: 59 | Attending: Emergency Medicine | Admitting: Emergency Medicine

## 2015-08-24 DIAGNOSIS — R200 Anesthesia of skin: Secondary | ICD-10-CM | POA: Diagnosis not present

## 2015-08-24 DIAGNOSIS — Z7901 Long term (current) use of anticoagulants: Secondary | ICD-10-CM | POA: Insufficient documentation

## 2015-08-24 DIAGNOSIS — Z86718 Personal history of other venous thrombosis and embolism: Secondary | ICD-10-CM | POA: Insufficient documentation

## 2015-08-24 DIAGNOSIS — Z86711 Personal history of pulmonary embolism: Secondary | ICD-10-CM | POA: Diagnosis not present

## 2015-08-24 DIAGNOSIS — Z8719 Personal history of other diseases of the digestive system: Secondary | ICD-10-CM | POA: Insufficient documentation

## 2015-08-24 DIAGNOSIS — R079 Chest pain, unspecified: Secondary | ICD-10-CM

## 2015-08-24 DIAGNOSIS — I10 Essential (primary) hypertension: Secondary | ICD-10-CM | POA: Diagnosis not present

## 2015-08-24 DIAGNOSIS — Z79899 Other long term (current) drug therapy: Secondary | ICD-10-CM | POA: Insufficient documentation

## 2015-08-24 DIAGNOSIS — R531 Weakness: Secondary | ICD-10-CM | POA: Diagnosis not present

## 2015-08-24 DIAGNOSIS — Z880 Allergy status to penicillin: Secondary | ICD-10-CM | POA: Insufficient documentation

## 2015-08-24 DIAGNOSIS — M25512 Pain in left shoulder: Secondary | ICD-10-CM

## 2015-08-24 DIAGNOSIS — E039 Hypothyroidism, unspecified: Secondary | ICD-10-CM | POA: Insufficient documentation

## 2015-08-24 LAB — COMPREHENSIVE METABOLIC PANEL
ALBUMIN: 4.5 g/dL (ref 3.5–5.0)
ALK PHOS: 85 U/L (ref 38–126)
ALT: 13 U/L — AB (ref 14–54)
AST: 20 U/L (ref 15–41)
Anion gap: 11 (ref 5–15)
BILIRUBIN TOTAL: 0.7 mg/dL (ref 0.3–1.2)
BUN: 11 mg/dL (ref 6–20)
CALCIUM: 9.2 mg/dL (ref 8.9–10.3)
CO2: 26 mmol/L (ref 22–32)
Chloride: 103 mmol/L (ref 101–111)
Creatinine, Ser: 0.88 mg/dL (ref 0.44–1.00)
GFR calc Af Amer: >60 mL/min (ref >60)
GFR calc non Af Amer: >60 mL/min (ref >60)
GLUCOSE: 86 mg/dL (ref 65–99)
Potassium: 3.6 mmol/L (ref 3.5–5.1)
Sodium: 140 mmol/L (ref 135–145)
TOTAL PROTEIN: 7.9 g/dL (ref 6.5–8.1)

## 2015-08-24 LAB — CBC WITH DIFFERENTIAL/PLATELET
BASOS PCT: 0 %
Basophils Absolute: 0 10*3/uL (ref 0.0–0.1)
EOS PCT: 3 %
Eosinophils Absolute: 0.2 10*3/uL (ref 0.0–0.7)
HEMATOCRIT: 37 % (ref 36.0–46.0)
Hemoglobin: 13 g/dL (ref 12.0–15.0)
LYMPHS PCT: 38 %
Lymphs Abs: 2.6 10*3/uL (ref 0.7–4.0)
MCH: 33.2 pg (ref 26.0–34.0)
MCHC: 35.1 g/dL (ref 30.0–36.0)
MCV: 94.4 fL (ref 78.0–100.0)
MONO ABS: 0.2 10*3/uL (ref 0.1–1.0)
MONOS PCT: 3 %
NEUTROS ABS: 3.9 10*3/uL (ref 1.7–7.7)
Neutrophils Relative %: 56 %
Platelets: 386 10*3/uL (ref 150–400)
RBC: 3.92 MIL/uL (ref 3.87–5.11)
RDW: 12.3 % (ref 11.5–15.5)
WBC: 6.9 10*3/uL (ref 4.0–10.5)

## 2015-08-24 LAB — I-STAT BETA HCG BLOOD, ED (MC, WL, AP ONLY): I-stat hCG, quantitative: <5 m[IU]/mL (ref <5)

## 2015-08-24 LAB — I-STAT TROPONIN, ED
Troponin i, poc: 0 ng/mL (ref 0.00–0.08)
Troponin i, poc: 0 ng/mL (ref 0.00–0.08)

## 2015-08-24 MED ORDER — CYCLOBENZAPRINE HCL 10 MG PO TABS: 10.0000 mg | ORAL_TABLET | Freq: Two times a day (BID) | ORAL | Status: DC | PRN

## 2015-08-24 MED ORDER — SODIUM CHLORIDE 0.9 % IV BOLUS (SEPSIS)
1000.0000 mL | Freq: Once | INTRAVENOUS | Status: AC
  Administered 2015-08-24: 1000 mL via INTRAVENOUS

## 2015-08-24 MED ORDER — IOHEXOL 350 MG/ML SOLN
100.0000 mL | Freq: Once | INTRAVENOUS | Status: AC | PRN
  Administered 2015-08-24: 100 mL via INTRAVENOUS

## 2015-08-24 NOTE — ED Provider Notes (Signed)
CSN: [id number]     Arrival date & time 08/24/15  1617 History   First MD Initiated Contact with Patient 08/24/15 1645     Chief Complaint  Patient presents with  . Extremity Weakness  . Chest Pain     (Consider location/radiation/quality/duration/timing/severity/associated sxs/prior Treatment) HPI Comments: Left arm and chest aching 5/10 Started Thanksgiving day and improved Had 2 more episodes before episode last night Left neck down to shoulder and arm, arm feels numbn, dull aching in chest Episodes last 1 hr then improve Not worse with exertion Worse with laying down Mild SOB  No hx of CAD, no fam hx, no smoking, no dm, no hlpd Hx of PE, taking xarelto, have missed some doses Htn   Patient is a 34 y.o. female presenting with extremity weakness and chest pain.  Extremity Weakness Associated symptoms include chest pain. Pertinent negatives include no abdominal pain, no headaches and no shortness of breath.  Chest Pain Associated symptoms: numbness   Associated symptoms: no abdominal pain, no back pain, no cough, no dizziness, no fever, no headache, no nausea, no shortness of breath, not vomiting and no weakness     Past Medical History  Diagnosis Date  . Hypertension   . History of blood clots   . Gallstones   . Hypothyroid   . Graves disease   . History of pulmonary embolus (PE)    Past Surgical History  Procedure Laterality Date  . Pilonidal cyst / sinus excision    . Breast surgery      cyst removed from right  . Cesarean section     Family History  Problem Relation Age of Onset  . Hypertension Other   . Hyperlipidemia Other    Social History  Substance Use Topics  . Smoking status: Never Smoker   . Smokeless tobacco: Not on file  . Alcohol Use: No   OB History    No data available     Review of Systems  Constitutional: Negative for fever.  HENT: Negative for sore throat.   Eyes: Negative for visual disturbance.  Respiratory: Negative for  cough and shortness of breath.   Cardiovascular: Positive for chest pain.  Gastrointestinal: Negative for nausea, vomiting, abdominal pain and diarrhea.  Genitourinary: Negative for difficulty urinating.  Musculoskeletal: Positive for extremity weakness. Negative for back pain and neck pain.  Skin: Negative for rash.  Neurological: Positive for numbness. Negative for dizziness, tremors, syncope, facial asymmetry, weakness and headaches.      Allergies  Penicillins  Home Medications   Prior to Admission medications   Medication Sig Start Date End Date Taking? Authorizing Provider  acetaminophen (TYLENOL) 325 MG tablet Take 650 mg by mouth every 6 (six) hours as needed for moderate pain or headache.   Yes Historical Provider, MD  diphenhydrAMINE (BENADRYL) 25 MG tablet Take 25-50 mg by mouth every 6 (six) hours as needed. itching   Yes Historical Provider, MD  levothyroxine (SYNTHROID, LEVOTHROID) 75 MCG tablet Take 1 tablet by mouth daily. 10/15/14 10/15/15 Yes Historical Provider, MD  metoprolol (LOPRESSOR) 50 MG tablet Take 50 mg by mouth 2 (two) times daily.   Yes Historical Provider, MD  Rivaroxaban (XARELTO) 20 MG TABS Take 20 mg by mouth every evening.    Yes Historical Provider, MD  cyclobenzaprine (FLEXERIL) 10 MG tablet Take 1 tablet (10 mg total) by mouth 2 (two) times daily as needed for muscle spasms. 08/24/15   Gareth Morgan, MD   BP 152/103 mmHg  Pulse 72  Temp(Src) 97.9 F (36.6 C) (Oral)  Resp 16  Ht 5\' 1"  (1.549 m)  Wt 208 lb (94.348 kg)  BMI 39.32 kg/m2  SpO2 99%  LMP 08/01/2015 Physical Exam  Constitutional: She is oriented to person, place, and time. She appears well-developed and well-nourished. No distress.  HENT:  Head: Normocephalic and atraumatic.  Eyes: Conjunctivae and EOM are normal.  Neck: Normal range of motion.  Cardiovascular: Normal rate, regular rhythm, normal heart sounds and intact distal pulses.  Exam reveals no gallop and no friction rub.    No murmur heard. Pulmonary/Chest: Effort normal and breath sounds normal. No respiratory distress. She has no wheezes. She has no rales.  Abdominal: Soft. She exhibits no distension. There is no tenderness. There is no guarding.  Musculoskeletal: She exhibits no edema.       Cervical back: She exhibits tenderness (left trapezius). She exhibits no bony tenderness.  Neurological: She is alert and oriented to person, place, and time. She has normal strength. No cranial nerve deficit or sensory deficit. Coordination and gait normal. GCS eye subscore is 4. GCS verbal subscore is 5. GCS motor subscore is 6.  Skin: Skin is warm and dry. No rash noted. She is not diaphoretic. No erythema.  Nursing note and vitals reviewed.   ED Course  Procedures (including critical care time) Labs Review Labs Reviewed  COMPREHENSIVE METABOLIC PANEL - Abnormal; Notable for the following:    ALT 13 (*)    All other components within normal limits  CBC WITH DIFFERENTIAL/PLATELET  I-STAT TROPOININ, ED  I-STAT BETA HCG BLOOD, ED ([facility], [facility], [facility] ONLY)  I-STAT TROPOININ, ED    Imaging Review Ct Angio Chest Pe W/cm &/or Wo Cm  08/24/2015  CLINICAL DATA:  Chest and left arm achiness, intermittent, for several weeks. Intermittent shortness of breath. EXAM: CT ANGIOGRAPHY CHEST WITH CONTRAST TECHNIQUE: Multidetector CT imaging of the chest was performed using the standard protocol during bolus administration of intravenous contrast. Multiplanar CT image reconstructions and MIPs were obtained to evaluate the vascular anatomy. CONTRAST:  155mL OMNIPAQUE IOHEXOL 350 MG/ML SOLN COMPARISON:  None. FINDINGS: There is no pulmonary embolism identified within the main, lobar or segmental pulmonary arteries bilaterally. Thoracic aorta is normal in caliber and configuration. No aortic aneurysm or dissection. Heart size is normal.  No pericardial effusion. No mass or enlarged lymph nodes seen within the mediastinum or perihilar regions.  Lungs are clear. No pleural effusion. No pneumothorax. Trachea and central bronchi are unremarkable. Limited images of the upper abdomen are unremarkable. Superficial soft tissues are unremarkable. No osseous abnormality appreciated. Review of the MIP images confirms the above findings. IMPRESSION: Normal exam.  No pulmonary embolism seen.  Lungs are clear. Electronically Signed   By: Franki Cabot M.D.   On: 08/24/2015 19:18   I have personally reviewed and evaluated these images and lab results as part of my medical decision-making.   EKG Interpretation   Date/Time:  Saturday August 24 2015 16:33:33 EST Ventricular Rate:  75 PR Interval:  180 QRS Duration: 81 QT Interval:  566 QTC Calculation: 632 R Axis:   -13 Text Interpretation:  Sinus rhythm Low voltage, precordial leads Rate  increased from last tracing, at TW flattening lateral leads Confirmed by  [facility] MD, Aariel Ems ([id number]) on 08/25/2015 3:12:30 AM      MDM   Final diagnoses:  Chest pain, unspecified chest pain type  Left shoulder pain   34 year old female with history of hypertension presents with concern of chest pain.  Differential diagnosis for chest pain includes pulmonary embolus, dissection, pneumothorax, pneumonia, ACS, myocarditis, pericarditis.  EKG was done and evaluate by me and showed no acute ST changes and no signs of pericarditis.  CTA PE study ordered given hx of PE and desire to screen for other abnormalities (i.e. Dissection) given numbness and showed no acute abnormalities.  Patient is low risk HEART score and had delta troponins which were both negative. Given this evaluation, history and physical have low suspicion for pulmonary embolus, pneumonia, ACS, myocarditis, pericarditis, dissection.  Patient reports numbness of arm however reports symptoms at time of my evaluation and has normal strength, sensation, and neuro exam and doubt CVA, TIA, or acute intracranial abnormality. No midline neck tenderness and  in presence of CP doubt emergent cervical pathology as etiology of symptoms.   Patient with possible musculoskeletal chest pain given pain with palpation. Given flexeril rx for pain in the ED and recommend follow up in one week.  Patient discharged in stable condition with understanding of reasons to return and recommend PCP follow up.    Gareth Morgan, MD 08/25/15 [phone]

## 2015-08-24 NOTE — ED Notes (Signed)
Pt states since thanksgiving she's been having chest & LT arm achiness, on and off.  States she's been under some stress.  Finds herself getting shob somewhat often, not always associated with the pain.  Denies n/v/d or fevers.

## 2015-08-24 NOTE — ED Notes (Signed)
Patient transported to CT 

## 2015-09-26 ENCOUNTER — Encounter (HOSPITAL_COMMUNITY): Payer: Self-pay

## 2015-09-26 ENCOUNTER — Emergency Department (HOSPITAL_COMMUNITY)
Admission: EM | Admit: 2015-09-26 | Discharge: 2015-09-26 | Disposition: A | Discharge status: Home / Self Care | Payer: 59 | Attending: Emergency Medicine | Admitting: Emergency Medicine

## 2015-09-26 DIAGNOSIS — Z3202 Encounter for pregnancy test, result negative: Secondary | ICD-10-CM | POA: Diagnosis not present

## 2015-09-26 DIAGNOSIS — Z880 Allergy status to penicillin: Secondary | ICD-10-CM | POA: Diagnosis not present

## 2015-09-26 DIAGNOSIS — K439 Ventral hernia without obstruction or gangrene: Secondary | ICD-10-CM | POA: Diagnosis not present

## 2015-09-26 DIAGNOSIS — I10 Essential (primary) hypertension: Secondary | ICD-10-CM | POA: Diagnosis not present

## 2015-09-26 DIAGNOSIS — E039 Hypothyroidism, unspecified: Secondary | ICD-10-CM | POA: Insufficient documentation

## 2015-09-26 DIAGNOSIS — Z79899 Other long term (current) drug therapy: Secondary | ICD-10-CM | POA: Insufficient documentation

## 2015-09-26 DIAGNOSIS — Z86711 Personal history of pulmonary embolism: Secondary | ICD-10-CM | POA: Diagnosis not present

## 2015-09-26 DIAGNOSIS — R109 Unspecified abdominal pain: Secondary | ICD-10-CM | POA: Diagnosis present

## 2015-09-26 LAB — URINALYSIS, ROUTINE W REFLEX MICROSCOPIC
BILIRUBIN URINE: NEGATIVE
GLUCOSE, UA: NEGATIVE mg/dL
Hgb urine dipstick: NEGATIVE
Ketones, ur: NEGATIVE mg/dL
Leukocytes, UA: NEGATIVE
NITRITE: NEGATIVE
PH: 6.5 (ref 5.0–8.0)
Protein, ur: NEGATIVE mg/dL
SPECIFIC GRAVITY, URINE: 1.019 (ref 1.005–1.030)

## 2015-09-26 LAB — POC URINE PREG, ED: Preg Test, Ur: NEGATIVE

## 2015-09-26 MED ORDER — TRAMADOL HCL 50 MG PO TABS: 50.0000 mg | ORAL_TABLET | Freq: Four times a day (QID) | ORAL | Status: DC | PRN

## 2015-09-26 NOTE — Discharge Instructions (Signed)
Hernia, Adult A hernia is the bulging of an organ or tissue through a weak spot in the muscles of the abdomen (abdominal wall). Hernias develop most often near the navel or groin. There are many kinds of hernias. Common kinds include:  Femoral hernia. This kind of hernia develops under the groin in the upper thigh area.  Inguinal hernia. This kind of hernia develops in the groin or scrotum.  Umbilical hernia. This kind of hernia develops near the navel.  Hiatal hernia. This kind of hernia causes part of the stomach to be pushed up into the chest.  Incisional hernia. This kind of hernia bulges through a scar from an abdominal surgery. CAUSES This condition may be caused by:  Heavy lifting.  Coughing over a long period of time.  Straining to have a bowel movement.  An incision made during an abdominal surgery.  A birth defect (congenital defect).  Excess weight or obesity.  Smoking.  Poor nutrition.  Cystic fibrosis.  Excess fluid in the abdomen.  Undescended testicles. SYMPTOMS Symptoms of a hernia include:  A lump on the abdomen. This is the first sign of a hernia. The lump may become more obvious with standing, straining, or coughing. It may get bigger over time if it is not treated or if the condition causing it is not treated.  Pain. A hernia is usually painless, but it may become painful over time if treatment is delayed. The pain is usually dull and may get worse with standing or lifting heavy objects. Sometimes a hernia gets tightly squeezed in the weak spot (strangulated) or stuck there (incarcerated) and causes additional symptoms. These symptoms may include:  Vomiting.  Nausea.  Constipation.  Irritability. DIAGNOSIS A hernia may be diagnosed with:  A physical exam. During the exam your health care provider may ask you to cough or to make a specific movement, because a hernia is usually more visible when you move.  Imaging tests. These can  include:  X-rays.  Ultrasound.  CT scan. TREATMENT A hernia that is small and painless may not need to be treated. A hernia that is large or painful may be treated with surgery. Inguinal hernias may be treated with surgery to prevent incarceration or strangulation. Strangulated hernias are always treated with surgery, because lack of blood to the trapped organ or tissue can cause it to die. Surgery to treat a hernia involves pushing the bulge back into place and repairing the weak part of the abdomen. HOME CARE INSTRUCTIONS  Avoid straining.  Do not lift anything heavier than 10 lb (4.5 kg).  Lift with your leg muscles, not your back muscles. This helps avoid strain.  When coughing, try to cough gently.  Prevent constipation. Constipation leads to straining with bowel movements, which can make a hernia worse or cause a hernia repair to break down. You can prevent constipation by:  Eating a high-fiber diet that includes plenty of fruits and vegetables.  Drinking enough fluids to keep your urine clear or pale yellow. Aim to drink 6-8 glasses of water per day.  Using a stool softener as directed by your health care provider.  Lose weight, if you are overweight.  Do not use any tobacco products, including cigarettes, chewing tobacco, or electronic cigarettes. If you need help quitting, ask your health care provider.  Keep all follow-up visits as directed by your health care provider. This is important. Your health care provider may need to monitor your condition. SEEK MEDICAL CARE IF:  You have   swelling, redness, and pain in the affected area.  Your bowel habits change. SEEK IMMEDIATE MEDICAL CARE IF:  You have a fever.  You have abdominal pain that is getting worse.  You feel nauseous or you vomit.  You cannot push the hernia back in place by gently pressing on it while you are lying down.  The hernia:  Changes in shape or size.  Is stuck outside the  abdomen.  Becomes discolored.  Feels hard or tender.   This information is not intended to replace advice given to you by your health care provider. Make sure you discuss any questions you have with your health care provider.   Document Released: 08/24/2005 Document Revised: 09/14/2014 Document Reviewed: 07/04/2014 Elsevier Interactive Patient Education 2016 [employer]  

## 2015-09-26 NOTE — ED Provider Notes (Signed)
CSN: [id number]     Arrival date & time 09/26/15  1129 History   First MD Initiated Contact with Patient 09/26/15 1511     Chief Complaint  Patient presents with  . hernia pain    HPI Pt has been having abdominal pain for a few months.  The sx have been getting worse.  She has seen her primary care doctor recently and had a CT scan.  She was told the pain was related to her hernia.   At the office the pain had increased when she was getting ready to leave so she was sent to the ED for evaluation.  No vomiting.  No fever.  THe pain increases with movement and straining. Past Medical History  Diagnosis Date  . Hypertension   . History of blood clots   . Gallstones   . Hypothyroid   . Graves disease   . History of pulmonary embolus (PE)    Past Surgical History  Procedure Laterality Date  . Pilonidal cyst / sinus excision    . Breast surgery      cyst removed from right  . Cesarean section     Family History  Problem Relation Age of Onset  . Hypertension Other   . Hyperlipidemia Other    Social History  Substance Use Topics  . Smoking status: Never Smoker   . Smokeless tobacco: None  . Alcohol Use: No   OB History    No data available     Review of Systems  Constitutional: Negative for fever.  Gastrointestinal: Negative for vomiting.  All other systems reviewed and are negative.     Allergies  Penicillins  Home Medications   Prior to Admission medications   Medication Sig Start Date End Date Taking? Authorizing Provider  acetaminophen (TYLENOL) 325 MG tablet Take 650 mg by mouth every 6 (six) hours as needed for moderate pain or headache.   Yes Historical Provider, MD  cyclobenzaprine (FLEXERIL) 10 MG tablet Take 1 tablet (10 mg total) by mouth 2 (two) times daily as needed for muscle spasms. 08/24/15  Yes Gareth Morgan, MD  diphenhydrAMINE (BENADRYL) 25 MG tablet Take 25-50 mg by mouth every 6 (six) hours as needed. itching   Yes Historical Provider, MD    levothyroxine (SYNTHROID, LEVOTHROID) 75 MCG tablet Take 1 tablet by mouth daily. 10/15/14 10/15/15 Yes Historical Provider, MD  Rivaroxaban (XARELTO) 20 MG TABS Take 20 mg by mouth every evening.    Yes Historical Provider, MD  traMADol (ULTRAM) 50 MG tablet Take 1 tablet (50 mg total) by mouth every 6 (six) hours as needed for moderate pain. 09/26/15   Dorie Rank, MD   BP 135/76 mmHg  Pulse 70  Temp(Src) 98.1 F (36.7 C) (Oral)  Resp 18  Ht 5\' 1"  (1.549 m)  Wt 95.255 kg  BMI 39.70 kg/m2  SpO2 99% Physical Exam  Constitutional: She appears well-developed and well-nourished. No distress.  HENT:  Head: Normocephalic and atraumatic.  Right Ear: External ear normal.  Left Ear: External ear normal.  Eyes: Conjunctivae are normal. Right eye exhibits no discharge. Left eye exhibits no discharge. No scleral icterus.  Neck: Neck supple. No tracheal deviation present.  Cardiovascular: Normal rate, regular rhythm and intact distal pulses.   Pulmonary/Chest: Effort normal and breath sounds normal. No stridor. No respiratory distress. She has no wheezes. She has no rales.  Abdominal: Soft. Bowel sounds are normal. She exhibits no distension. There is no tenderness. There is no rebound and no  guarding. A hernia is present. Hernia confirmed positive in the ventral area.    Musculoskeletal: She exhibits no edema or tenderness.  Neurological: She is alert. She has normal strength. No cranial nerve deficit (no facial droop, extraocular movements intact, no slurred speech) or sensory deficit. She exhibits normal muscle tone. She displays no seizure activity. Coordination normal.  Skin: Skin is warm and dry. No rash noted.  Psychiatric: She has a normal mood and affect.  Nursing note and vitals reviewed.   ED Course  Procedures (including critical care time) Labs Review Labs Reviewed  URINALYSIS, ROUTINE W REFLEX MICROSCOPIC (NOT AT [facility])  POC URINE PREG, ED    Imaging Review No results found. I  have personally reviewed and evaluated these images and lab results as part of my medical decision-making.    MDM   Final diagnoses:  Ventral hernia without obstruction or gangrene    Patient has a ventral hernia on exam. I had the patient palpate this area as well. I demonstrated how to manually reduce the hernia. The patient is not having any trouble with any vomiting. There are no signs of erythema or incarceration.  I will have her follow-up with a general surgeon.  Warning signs and precautions discussed    Dorie Rank, MD 09/26/15 [phone]

## 2015-09-26 NOTE — ED Notes (Signed)
Patient here for further evaluation of hernia. Seen at primary MD's office today and thinks she pushed on the hernia too much, now having pain to same, tender to palpation

## 2015-10-15 ENCOUNTER — Other Ambulatory Visit: Payer: Self-pay | Admitting: Surgery

## 2015-10-16 ENCOUNTER — Emergency Department (HOSPITAL_COMMUNITY)
Admission: EM | Admit: 2015-10-16 | Discharge: 2015-10-16 | Disposition: A | Discharge status: Home / Self Care | Payer: 59 | Attending: Emergency Medicine | Admitting: Emergency Medicine

## 2015-10-16 ENCOUNTER — Encounter (HOSPITAL_COMMUNITY): Payer: Self-pay

## 2015-10-16 ENCOUNTER — Emergency Department (HOSPITAL_COMMUNITY): Payer: 59

## 2015-10-16 DIAGNOSIS — R109 Unspecified abdominal pain: Secondary | ICD-10-CM

## 2015-10-16 DIAGNOSIS — Z3202 Encounter for pregnancy test, result negative: Secondary | ICD-10-CM | POA: Insufficient documentation

## 2015-10-16 DIAGNOSIS — I10 Essential (primary) hypertension: Secondary | ICD-10-CM | POA: Insufficient documentation

## 2015-10-16 DIAGNOSIS — Z86718 Personal history of other venous thrombosis and embolism: Secondary | ICD-10-CM | POA: Diagnosis not present

## 2015-10-16 DIAGNOSIS — R112 Nausea with vomiting, unspecified: Secondary | ICD-10-CM | POA: Diagnosis not present

## 2015-10-16 DIAGNOSIS — E039 Hypothyroidism, unspecified: Secondary | ICD-10-CM | POA: Diagnosis not present

## 2015-10-16 DIAGNOSIS — Z7901 Long term (current) use of anticoagulants: Secondary | ICD-10-CM | POA: Insufficient documentation

## 2015-10-16 DIAGNOSIS — K429 Umbilical hernia without obstruction or gangrene: Secondary | ICD-10-CM | POA: Insufficient documentation

## 2015-10-16 DIAGNOSIS — Z880 Allergy status to penicillin: Secondary | ICD-10-CM | POA: Diagnosis not present

## 2015-10-16 DIAGNOSIS — Z79899 Other long term (current) drug therapy: Secondary | ICD-10-CM | POA: Diagnosis not present

## 2015-10-16 DIAGNOSIS — Z86711 Personal history of pulmonary embolism: Secondary | ICD-10-CM | POA: Insufficient documentation

## 2015-10-16 DIAGNOSIS — R1033 Periumbilical pain: Secondary | ICD-10-CM | POA: Diagnosis present

## 2015-10-16 LAB — CBC WITH DIFFERENTIAL/PLATELET
BASOS ABS: 0 10*3/uL (ref 0.0–0.1)
Basophils Relative: 0 %
Eosinophils Absolute: 0.1 10*3/uL (ref 0.0–0.7)
Eosinophils Relative: 1 %
HEMATOCRIT: 39.3 % (ref 36.0–46.0)
Hemoglobin: 13.3 g/dL (ref 12.0–15.0)
LYMPHS PCT: 34 %
Lymphs Abs: 2.2 10*3/uL (ref 0.7–4.0)
MCH: 32.5 pg (ref 26.0–34.0)
MCHC: 33.8 g/dL (ref 30.0–36.0)
MCV: 96.1 fL (ref 78.0–100.0)
MONO ABS: 0.4 10*3/uL (ref 0.1–1.0)
Monocytes Relative: 6 %
NEUTROS ABS: 3.8 10*3/uL (ref 1.7–7.7)
Neutrophils Relative %: 59 %
Platelets: 365 10*3/uL (ref 150–400)
RBC: 4.09 MIL/uL (ref 3.87–5.11)
RDW: 12.2 % (ref 11.5–15.5)
WBC: 6.4 10*3/uL (ref 4.0–10.5)

## 2015-10-16 LAB — URINALYSIS, ROUTINE W REFLEX MICROSCOPIC
Bilirubin Urine: NEGATIVE
GLUCOSE, UA: NEGATIVE mg/dL
Hgb urine dipstick: NEGATIVE
KETONES UR: NEGATIVE mg/dL
LEUKOCYTES UA: NEGATIVE
Nitrite: NEGATIVE
PH: 5.5 (ref 5.0–8.0)
Protein, ur: NEGATIVE mg/dL
Specific Gravity, Urine: 1.014 (ref 1.005–1.030)

## 2015-10-16 LAB — COMPREHENSIVE METABOLIC PANEL
ALT: 15 U/L (ref 14–54)
AST: 20 U/L (ref 15–41)
Albumin: 4.2 g/dL (ref 3.5–5.0)
Alkaline Phosphatase: 67 U/L (ref 38–126)
Anion gap: 10 (ref 5–15)
BUN: 9 mg/dL (ref 6–20)
CHLORIDE: 102 mmol/L (ref 101–111)
CO2: 26 mmol/L (ref 22–32)
CREATININE: 0.82 mg/dL (ref 0.44–1.00)
Calcium: 9.2 mg/dL (ref 8.9–10.3)
GFR calc non Af Amer: >60 mL/min (ref >60)
GLUCOSE: 80 mg/dL (ref 65–99)
Potassium: 3.7 mmol/L (ref 3.5–5.1)
SODIUM: 138 mmol/L (ref 135–145)
Total Bilirubin: 0.8 mg/dL (ref 0.3–1.2)
Total Protein: 7.7 g/dL (ref 6.5–8.1)

## 2015-10-16 LAB — I-STAT CG4 LACTIC ACID, ED: Lactic Acid, Venous: 1.01 mmol/L (ref 0.5–2.0)

## 2015-10-16 LAB — LIPASE, BLOOD: LIPASE: 20 U/L (ref 11–51)

## 2015-10-16 LAB — I-STAT BETA HCG BLOOD, ED (MC, WL, AP ONLY): I-stat hCG, quantitative: <5 m[IU]/mL (ref <5)

## 2015-10-16 MED ORDER — MORPHINE SULFATE (PF) 4 MG/ML IV SOLN
4.0000 mg | Freq: Once | INTRAVENOUS | Status: AC
  Administered 2015-10-16: 4 mg via INTRAVENOUS
  Filled 2015-10-16: qty 1

## 2015-10-16 MED ORDER — ONDANSETRON HCL 4 MG/2ML IJ SOLN
4.0000 mg | Freq: Once | INTRAMUSCULAR | Status: AC
  Administered 2015-10-16: 4 mg via INTRAVENOUS
  Filled 2015-10-16: qty 2

## 2015-10-16 MED ORDER — IOHEXOL 300 MG/ML  SOLN
100.0000 mL | Freq: Once | INTRAMUSCULAR | Status: AC | PRN
  Administered 2015-10-16: 100 mL via INTRAVENOUS

## 2015-10-16 MED ORDER — SODIUM CHLORIDE 0.9 % IV BOLUS (SEPSIS)
1000.0000 mL | Freq: Once | INTRAVENOUS | Status: AC
  Administered 2015-10-16: 1000 mL via INTRAVENOUS

## 2015-10-16 MED ORDER — ONDANSETRON 4 MG PO TBDP: ORAL_TABLET | ORAL | Status: DC

## 2015-10-16 MED ORDER — IOHEXOL 300 MG/ML  SOLN
50.0000 mL | Freq: Once | INTRAMUSCULAR | Status: AC | PRN
  Administered 2015-10-16: 50 mL via ORAL

## 2015-10-16 NOTE — ED Notes (Signed)
Pt reports she has an abdominal hernia, left side, was seen at [facility] to schedule her hernia surgery. Pt reports she was told they were unable to schedule her surgery at that time because the hernia is so enlarged. Pt reports that since the examination in the office, pt has had pain at the side of the hernia, has noticed increased swelling, and has been vomiting.

## 2015-10-16 NOTE — ED Notes (Signed)
RN DRAWING LABS 

## 2015-10-16 NOTE — ED Notes (Signed)
Pt reports L abd hernia which has been causing pain for the last 4 days.

## 2015-10-16 NOTE — ED Provider Notes (Signed)
CSN: [id number]     Arrival date & time 10/16/15  1004 History   First MD Initiated Contact with Patient 10/16/15 1048     Chief Complaint  Patient presents with  . Hernia  . Emesis     (Consider location/radiation/quality/duration/timing/severity/associated sxs/prior Treatment) Patient is a 35 y.o. female presenting with vomiting and abdominal pain. The history is provided by the patient.  Emesis Associated symptoms: abdominal pain   Associated symptoms: no arthralgias, no chills, no headaches and no myalgias   Abdominal Pain Pain location:  Periumbilical Pain quality: sharp and shooting   Pain radiates to:  Does not radiate Pain severity:  Moderate Onset quality:  Gradual Duration:  1 day Timing:  Constant Progression:  Worsening Chronicity:  New Relieved by:  Nothing Worsened by:  Nothing tried Ineffective treatments:  None tried Associated symptoms: nausea and vomiting   Associated symptoms: no chest pain, no chills, no dysuria, no fever, no flatus and no shortness of breath     35 yo F with a chief complaint of nausea vomiting. This been going on for the past day. Patient was seen by general surgery yesterday for evaluation of her hernia. They felt like it may be too large to repair. Patient states after it was manipulated she had some pain to the area and then started having vomiting. Denies flatus since last night. Has not failed to keep anything down. Called the surgery office today and they suggested she come to the emerge department to be evaluated for volvulus or obstruction.   Past Medical History  Diagnosis Date  . Hypertension   . History of blood clots   . Gallstones   . Hypothyroid   . Graves disease   . History of pulmonary embolus (PE)    Past Surgical History  Procedure Laterality Date  . Pilonidal cyst / sinus excision    . Breast surgery      cyst removed from right  . Cesarean section     Family History  Problem Relation Age of Onset  .  Hypertension Other   . Hyperlipidemia Other    Social History  Substance Use Topics  . Smoking status: Never Smoker   . Smokeless tobacco: None  . Alcohol Use: No   OB History    No data available     Review of Systems  Constitutional: Negative for fever and chills.  HENT: Negative for congestion and rhinorrhea.   Eyes: Negative for redness and visual disturbance.  Respiratory: Negative for shortness of breath and wheezing.   Cardiovascular: Negative for chest pain and palpitations.  Gastrointestinal: Positive for nausea, vomiting and abdominal pain. Negative for flatus.  Genitourinary: Negative for dysuria and urgency.  Musculoskeletal: Negative for myalgias and arthralgias.  Skin: Negative for pallor and wound.  Neurological: Negative for dizziness and headaches.      Allergies  Penicillins  Home Medications   Prior to Admission medications   Medication Sig Start Date End Date Taking? Authorizing Provider  acetaminophen (TYLENOL) 325 MG tablet Take 650 mg by mouth every 6 (six) hours as needed for moderate pain or headache.   Yes Historical Provider, MD  cyclobenzaprine (FLEXERIL) 10 MG tablet Take 1 tablet (10 mg total) by mouth 2 (two) times daily as needed for muscle spasms. 08/24/15  Yes Gareth Morgan, MD  diphenhydrAMINE (BENADRYL) 25 MG tablet Take 25-50 mg by mouth every 6 (six) hours as needed. itching   Yes Historical Provider, MD  levothyroxine (SYNTHROID) 100 MCG tablet Take  100 mcg by mouth daily. 09/14/15 09/13/16 Yes Historical Provider, MD  metoprolol (LOPRESSOR) 50 MG tablet Take 50 mg by mouth 2 (two) times daily. 09/12/15  Yes Historical Provider, MD  Rivaroxaban (XARELTO) 20 MG TABS Take 20 mg by mouth every evening.    Yes Historical Provider, MD  traMADol (ULTRAM) 50 MG tablet Take 1 tablet (50 mg total) by mouth every 6 (six) hours as needed for moderate pain. 09/26/15  Yes Dorie Rank, MD  ondansetron (ZOFRAN ODT) 4 MG disintegrating tablet 4mg  ODT q4 hours  prn nausea/vomit 10/16/15   Deno Etienne, DO   BP 147/86 mmHg  Pulse 70  Temp(Src) 97.6 F (36.4 C) (Oral)  Resp 16  SpO2 100%  LMP 09/28/2015 (Approximate) Physical Exam  Constitutional: She is oriented to person, place, and time. She appears well-developed and well-nourished. No distress.  HENT:  Head: Normocephalic and atraumatic.  Eyes: EOM are normal. Pupils are equal, round, and reactive to light.  Neck: Normal range of motion. Neck supple.  Cardiovascular: Normal rate and regular rhythm.  Exam reveals no gallop and no friction rub.   No murmur heard. Pulmonary/Chest: Effort normal. She has no wheezes. She has no rales.  Abdominal: Soft. She exhibits mass (Hernia noted just cranial and to the left of the umbilicus. Difficult to approximate due to body habitus.). She exhibits no distension. There is no tenderness. There is no rebound.  Musculoskeletal: She exhibits no edema or tenderness.  Neurological: She is alert and oriented to person, place, and time.  Skin: Skin is warm and dry. She is not diaphoretic.  Psychiatric: She has a normal mood and affect. Her behavior is normal.  Nursing note and vitals reviewed.   ED Course  Procedures (including critical care time) Labs Review Labs Reviewed  URINALYSIS, ROUTINE W REFLEX MICROSCOPIC (NOT AT [facility]) - Abnormal; Notable for the following:    APPearance CLOUDY (*)    All other components within normal limits  COMPREHENSIVE METABOLIC PANEL  LIPASE, BLOOD  CBC WITH DIFFERENTIAL/PLATELET  I-STAT CG4 LACTIC ACID, ED  I-STAT BETA HCG BLOOD, ED ([facility], [facility], [facility] ONLY)    Imaging Review Ct Abdomen Pelvis W Contrast  10/16/2015  CLINICAL DATA:  Abdominal pain for 4 days.  Left abdominal hernia. EXAM: CT ABDOMEN AND PELVIS WITH CONTRAST TECHNIQUE: Multidetector CT imaging of the abdomen and pelvis was performed using the standard protocol following bolus administration of intravenous contrast. CONTRAST:  144mL OMNIPAQUE IOHEXOL 300 MG/ML SOLN,  36mL OMNIPAQUE IOHEXOL 300 MG/ML SOLN COMPARISON:  CT 03/09/2011 and 09/23/2015. FINDINGS: Lower chest: Clear lung bases. No significant pleural or pericardial effusion. Hepatobiliary: The liver is normal in density without focal abnormality. No evidence of gallstones, gallbladder wall thickening or biliary dilatation. Pancreas: Unremarkable. No pancreatic ductal dilatation or surrounding inflammatory changes. Spleen: Normal in size without focal abnormality. Adrenals/Urinary Tract: Both adrenal glands appear normal. The kidneys appear normal without evidence of urinary tract calculus, suspicious lesion or hydronephrosis. No bladder abnormalities are seen. Stomach/Bowel: No evidence of bowel wall thickening, distention or surrounding inflammatory change. There is stable herniation of the transverse colon into a ventral hernia. No evidence of incarceration. The appendix appears normal. Vascular/Lymphatic: There are no enlarged abdominal or pelvic lymph nodes. No significant vascular findings are present. There are numerous ovarian vein phleboliths, more numerous on the right. Reproductive: Unremarkable. Other: Again demonstrated is a wide-mouth hernia of the anterior abdominal wall. This projects off midline to the left from the umbilicus. The neck of this hernia  measures up to 9.9 x 8.6 cm, similar to the prior study. Transverse colon extends into the hernia. There is no edema or fluid within the hernia. Musculoskeletal: No acute or significant osseous findings. IMPRESSION: 1. Stable large ventral hernia containing omental fat and transverse colon. This hernia has enlarged from 2012. No evidence of incarceration or bowel obstruction. 2. No acute findings seen. Electronically Signed   By: Richardean Sale M.D.   On: 10/16/2015 13:24   I have personally reviewed and evaluated these images and lab results as part of my medical decision-making.   EKG Interpretation None      MDM   Final diagnoses:  Abdominal  pain, unspecified abdominal location    35 yo F with a chief complaints of pain at the hernia and vomiting. CT scan negative for acute obstruction. Patient feeling much better after morphine and Zofran. We'll discharge home. Surgery follow-up.  :  I have discussed the diagnosis/risks/treatment options with the patient and family and believe the pt to be eligible for discharge home to follow-up with [facility]. We also discussed returning to the ED immediately if new or worsening sx occur. We discussed the sx which are most concerning (e.g., sudden worsening pain, fever, inability to tolerate by mouth) that necessitate immediate return. Medications administered to the patient during their visit and any new prescriptions provided to the patient are listed below.  Medications given during this visit Medications  morphine 4 MG/ML injection 4 mg (4 mg Intravenous Given 10/16/15 1137)  ondansetron (ZOFRAN) injection 4 mg (4 mg Intravenous Given 10/16/15 1137)  sodium chloride 0.9 % bolus 1,000 mL (0 mLs Intravenous Stopped 10/16/15 1416)  iohexol (OMNIPAQUE) 300 MG/ML solution 50 mL (50 mLs Oral Contrast Given 10/16/15 1145)  iohexol (OMNIPAQUE) 300 MG/ML solution 100 mL (100 mLs Intravenous Contrast Given 10/16/15 1303)  ondansetron (ZOFRAN) injection 4 mg (4 mg Intravenous Given 10/16/15 1408)    Discharge Medication List as of 10/16/2015  1:53 PM    START taking these medications   Details  ondansetron (ZOFRAN ODT) 4 MG disintegrating tablet 4mg  ODT q4 hours prn nausea/vomit, Print        The patient appears reasonably screen and/or stabilized for discharge and I doubt any other medical condition or other EMC requiring further screening, evaluation, or treatment in the ED at this time prior to discharge.      Deno Etienne, DO 10/16/15 1516

## 2015-10-16 NOTE — Discharge Instructions (Signed)

## 2015-10-18 ENCOUNTER — Other Ambulatory Visit: Payer: Self-pay | Admitting: Surgery

## 2016-02-13 ENCOUNTER — Other Ambulatory Visit: Payer: Self-pay | Admitting: Surgery

## 2016-03-21 ENCOUNTER — Emergency Department (HOSPITAL_COMMUNITY): Payer: 59

## 2016-03-21 ENCOUNTER — Emergency Department (HOSPITAL_COMMUNITY)
Admission: EM | Admit: 2016-03-21 | Discharge: 2016-03-21 | Disposition: A | Discharge status: Home / Self Care | Payer: 59 | Attending: Emergency Medicine | Admitting: Emergency Medicine

## 2016-03-21 ENCOUNTER — Encounter (HOSPITAL_COMMUNITY): Payer: Self-pay

## 2016-03-21 DIAGNOSIS — R109 Unspecified abdominal pain: Secondary | ICD-10-CM | POA: Insufficient documentation

## 2016-03-21 DIAGNOSIS — I10 Essential (primary) hypertension: Secondary | ICD-10-CM | POA: Diagnosis not present

## 2016-03-21 DIAGNOSIS — Z7901 Long term (current) use of anticoagulants: Secondary | ICD-10-CM | POA: Insufficient documentation

## 2016-03-21 LAB — COMPREHENSIVE METABOLIC PANEL
ALBUMIN: 3.9 g/dL (ref 3.5–5.0)
ALK PHOS: 71 U/L (ref 38–126)
ALT: 14 U/L (ref 14–54)
ANION GAP: 7 (ref 5–15)
AST: 20 U/L (ref 15–41)
BUN: 7 mg/dL (ref 6–20)
CALCIUM: 9.1 mg/dL (ref 8.9–10.3)
CHLORIDE: 107 mmol/L (ref 101–111)
CO2: 22 mmol/L (ref 22–32)
Creatinine, Ser: 0.84 mg/dL (ref 0.44–1.00)
GFR calc non Af Amer: >60 mL/min (ref >60)
Glucose, Bld: 112 mg/dL — ABNORMAL HIGH (ref 65–99)
POTASSIUM: 3.6 mmol/L (ref 3.5–5.1)
SODIUM: 136 mmol/L (ref 135–145)
Total Bilirubin: 1.1 mg/dL (ref 0.3–1.2)
Total Protein: 7.2 g/dL (ref 6.5–8.1)

## 2016-03-21 LAB — URINALYSIS, ROUTINE W REFLEX MICROSCOPIC
Bilirubin Urine: NEGATIVE
Glucose, UA: NEGATIVE mg/dL
Ketones, ur: NEGATIVE mg/dL
Leukocytes, UA: NEGATIVE
Nitrite: NEGATIVE
PH: 6.5 (ref 5.0–8.0)
Protein, ur: 30 mg/dL — AB
SPECIFIC GRAVITY, URINE: 1.024 (ref 1.005–1.030)

## 2016-03-21 LAB — CBC
HEMATOCRIT: 38.1 % (ref 36.0–46.0)
HEMOGLOBIN: 13.5 g/dL (ref 12.0–15.0)
MCH: 32.8 pg (ref 26.0–34.0)
MCHC: 35.4 g/dL (ref 30.0–36.0)
MCV: 92.5 fL (ref 78.0–100.0)
Platelets: 340 10*3/uL (ref 150–400)
RBC: 4.12 MIL/uL (ref 3.87–5.11)
RDW: 12.1 % (ref 11.5–15.5)
WBC: 5.6 10*3/uL (ref 4.0–10.5)

## 2016-03-21 LAB — URINE MICROSCOPIC-ADD ON

## 2016-03-21 LAB — POC URINE PREG, ED: PREG TEST UR: NEGATIVE

## 2016-03-21 LAB — PREGNANCY, URINE: PREG TEST UR: NEGATIVE

## 2016-03-21 LAB — LIPASE, BLOOD: LIPASE: 18 U/L (ref 11–51)

## 2016-03-21 MED ORDER — ONDANSETRON 4 MG PO TBDP
8.0000 mg | ORAL_TABLET | Freq: Once | ORAL | Status: AC
  Administered 2016-03-21: 8 mg via ORAL
  Filled 2016-03-21: qty 2

## 2016-03-21 MED ORDER — ONDANSETRON 4 MG PO TBDP
4.0000 mg | ORAL_TABLET | Freq: Once | ORAL | Status: AC | PRN
  Administered 2016-03-21: 4 mg via ORAL

## 2016-03-21 MED ORDER — ONDANSETRON 4 MG PO TBDP
ORAL_TABLET | ORAL | Status: AC
  Filled 2016-03-21: qty 1

## 2016-03-21 MED ORDER — OXYCODONE-ACETAMINOPHEN 5-325 MG PO TABS
1.0000 | ORAL_TABLET | Freq: Once | ORAL | Status: AC
  Administered 2016-03-21: 1 via ORAL
  Filled 2016-03-21: qty 1

## 2016-03-21 NOTE — ED Provider Notes (Signed)
CSN: [id number]     Arrival date & time 03/21/16  1404 History   First MD Initiated Contact with Patient 03/21/16 1614     Chief Complaint  Patient presents with  . Abdominal Pain  . Nausea     (Consider location/radiation/quality/duration/timing/severity/associated sxs/prior Treatment) Patient is a 35 y.o. female presenting with abdominal pain. The history is provided by the patient.  Abdominal Pain Associated symptoms: no chest pain, no chills, no cough, no dysuria, no fever, no shortness of breath, no sore throat, no vaginal discharge and no vomiting   Patient c/o acute onset, mid to left abd pain shortly prior to ED arrival today. Was mod-severe. Constant, non radiating, without specific exacerbating or alleviating factors. Had some nausea. No vomiting. Had loose bm today. No severe diarrhea. Denies hx same pain. No hematuria or dysuria. Is on period - states normal.  No unusual vaginal discharge or bleeding. No back or flank pain.  No fever or chills.      Past Medical History  Diagnosis Date  . Hypertension   . History of blood clots   . Gallstones   . Hypothyroid   . Graves disease   . History of pulmonary embolus (PE)    Past Surgical History  Procedure Laterality Date  . Pilonidal cyst / sinus excision    . Breast surgery      cyst removed from right  . Cesarean section     Family History  Problem Relation Age of Onset  . Hypertension Other   . Hyperlipidemia Other    Social History  Substance Use Topics  . Smoking status: Never Smoker   . Smokeless tobacco: None  . Alcohol Use: No   OB History    No data available     Review of Systems  Constitutional: Negative for fever and chills.  HENT: Negative for sore throat.   Eyes: Negative for redness.  Respiratory: Negative for cough and shortness of breath.   Cardiovascular: Negative for chest pain.  Gastrointestinal: Positive for abdominal pain. Negative for vomiting.  Genitourinary: Negative for dysuria,  flank pain and vaginal discharge.  Musculoskeletal: Negative for back pain and neck pain.  Skin: Negative for rash.  Neurological: Negative for headaches.  Hematological: Does not bruise/bleed easily.  Psychiatric/Behavioral: Negative for confusion.      Allergies  Penicillins  Home Medications   Prior to Admission medications   Medication Sig Start Date End Date Taking? Authorizing Provider  acetaminophen (TYLENOL) 325 MG tablet Take 650 mg by mouth every 6 (six) hours as needed for moderate pain or headache.    Historical Provider, MD  cyclobenzaprine (FLEXERIL) 10 MG tablet Take 1 tablet (10 mg total) by mouth 2 (two) times daily as needed for muscle spasms. 08/24/15   Gareth Morgan, MD  diphenhydrAMINE (BENADRYL) 25 MG tablet Take 25-50 mg by mouth every 6 (six) hours as needed. itching    Historical Provider, MD  levothyroxine (SYNTHROID) 100 MCG tablet Take 100 mcg by mouth daily. 09/14/15 09/13/16  Historical Provider, MD  metoprolol (LOPRESSOR) 50 MG tablet Take 50 mg by mouth 2 (two) times daily. 09/12/15   Historical Provider, MD  ondansetron (ZOFRAN ODT) 4 MG disintegrating tablet 4mg  ODT q4 hours prn nausea/vomit 10/16/15   Deno Etienne, DO  Rivaroxaban (XARELTO) 20 MG TABS Take 20 mg by mouth every evening.     Historical Provider, MD  traMADol (ULTRAM) 50 MG tablet Take 1 tablet (50 mg total) by mouth every 6 (six) hours as  needed for moderate pain. 09/26/15   Dorie Rank, MD   BP 127/79 mmHg  Pulse 66  Temp(Src) 97.4 F (36.3 C) (Oral)  Resp 18  Ht 5\' 1"  (1.549 m)  Wt 97.523 kg  BMI 40.64 kg/m2  SpO2 100%  LMP 03/21/2016 Physical Exam  Constitutional: She appears well-developed and well-nourished. No distress.  HENT:  Mouth/Throat: Oropharynx is clear and moist.  Eyes: Conjunctivae are normal. No scleral icterus.  Neck: Neck supple. No tracheal deviation present.  Cardiovascular: Normal rate, regular rhythm, normal heart sounds and intact distal pulses.   No murmur  heard. Pulmonary/Chest: Effort normal and breath sounds normal. No respiratory distress.  Abdominal: Soft. Normal appearance and bowel sounds are normal. She exhibits no distension and no mass. There is no tenderness. There is no rebound and no guarding.  No incarcerated hernia.   Genitourinary:  No cva tenderness  Musculoskeletal: She exhibits no edema.  Neurological: She is alert.  Skin: Skin is warm and dry. No rash noted. She is not diaphoretic.  Psychiatric: She has a normal mood and affect.  Nursing note and vitals reviewed.   ED Course  Procedures (including critical care time) Labs Review    Results for orders placed or performed during the hospital encounter of 03/21/16  Lipase, blood  Result Value Ref Range   Lipase 18 11 - 51 U/L  Comprehensive metabolic panel  Result Value Ref Range   Sodium 136 135 - 145 mmol/L   Potassium 3.6 3.5 - 5.1 mmol/L   Chloride 107 101 - 111 mmol/L   CO2 22 22 - 32 mmol/L   Glucose, Bld 112 (H) 65 - 99 mg/dL   BUN 7 6 - 20 mg/dL   Creatinine, Ser 0.84 0.44 - 1.00 mg/dL   Calcium 9.1 8.9 - 10.3 mg/dL   Total Protein 7.2 6.5 - 8.1 g/dL   Albumin 3.9 3.5 - 5.0 g/dL   AST 20 15 - 41 U/L   ALT 14 14 - 54 U/L   Alkaline Phosphatase 71 38 - 126 U/L   Total Bilirubin 1.1 0.3 - 1.2 mg/dL   GFR calc non Af Amer >60 >60 mL/min   GFR calc Af Amer >60 >60 mL/min   Anion gap 7 5 - 15  CBC  Result Value Ref Range   WBC 5.6 4.0 - 10.5 K/uL   RBC 4.12 3.87 - 5.11 MIL/uL   Hemoglobin 13.5 12.0 - 15.0 g/dL   HCT 38.1 36.0 - 46.0 %   MCV 92.5 78.0 - 100.0 fL   MCH 32.8 26.0 - 34.0 pg   MCHC 35.4 30.0 - 36.0 g/dL   RDW 12.1 11.5 - 15.5 %   Platelets 340 150 - 400 K/uL  Urinalysis, Routine w reflex microscopic  Result Value Ref Range   Color, Urine AMBER (A) YELLOW   APPearance CLOUDY (A) CLEAR   Specific Gravity, Urine 1.024 1.005 - 1.030   pH 6.5 5.0 - 8.0   Glucose, UA NEGATIVE NEGATIVE mg/dL   Hgb urine dipstick LARGE (A) NEGATIVE    Bilirubin Urine NEGATIVE NEGATIVE   Ketones, ur NEGATIVE NEGATIVE mg/dL   Protein, ur 30 (A) NEGATIVE mg/dL   Nitrite NEGATIVE NEGATIVE   Leukocytes, UA NEGATIVE NEGATIVE  Urine microscopic-add on  Result Value Ref Range   Squamous Epithelial / LPF 6-30 (A) NONE SEEN   WBC, UA 0-5 0 - 5 WBC/hpf   RBC / HPF TOO NUMEROUS TO COUNT 0 - 5 RBC/hpf   Bacteria,  UA MANY (A) NONE SEEN  POC Urine Pregnancy, ED (do NOT order at [facility])  Result Value Ref Range   Preg Test, Ur NEGATIVE NEGATIVE   Dg Abd 1 View  03/21/2016  CLINICAL DATA:  Left-sided abdominal pain. EXAM: ABDOMEN - 1 VIEW COMPARISON:  CT scan 10/16/2015. FINDINGS: Phleboliths noted medial right abdomen as seen on previous CT scan as well as over the left anatomic pelvis. Bowel gas pattern is normal. Visualized bony anatomy is unremarkable. IMPRESSION: Negative. Electronically Signed   By: Misty Stanley M.D.   On: 03/21/2016 16:53      I have personally reviewed and evaluated these images and lab results as part of my medical decision-making.    MDM   Labs. Xr.  Percocet 1 po for pain (pt has ride, does not have to drive).  zofran po for nausea.  Reviewed nursing notes and prior charts for additional history.   abd xrays negative/ no sbo. abd soft non tender, non distended on recheck. Vitals normal. Afeb.   Patient currently appears stable for d/c.       Lajean Saver, MD 03/21/16 1859

## 2016-03-21 NOTE — ED Notes (Signed)
Pt here with c/o abd pain that started around 30 minutes ago, she reports hx of hernia and gall stones. Pt also reports nausea with one episode of emesis. LMP currently.

## 2016-03-21 NOTE — Discharge Instructions (Signed)
It was our pleasure to provide your ER care today - we hope that you feel better.  Rest. Drink plenty of fluids.  Follow up with primary care doctor in the next few days for recheck.  Return to ER if worse, new symptoms, severe or worsening abdominal pain, persistent vomiting, fevers, other concern.  You were given pain medication in the ER - no driving for the next 4 hours.      Abdominal Pain, Adult Many things can cause abdominal pain. Usually, abdominal pain is not caused by a disease and will improve without treatment. It can often be observed and treated at home. Your health care provider will do a physical exam and possibly order blood tests and X-rays to help determine the seriousness of your pain. However, in many cases, more time must pass before a clear cause of the pain can be found. Before that point, your health care provider may not know if you need more testing or further treatment. HOME CARE INSTRUCTIONS Monitor your abdominal pain for any changes. The following actions may help to alleviate any discomfort you are experiencing:  Only take over-the-counter or prescription medicines as directed by your health care provider.  Do not take laxatives unless directed to do so by your health care provider.  Try a clear liquid diet (broth, tea, or water) as directed by your health care provider. Slowly move to a bland diet as tolerated. SEEK MEDICAL CARE IF:  You have unexplained abdominal pain.  You have abdominal pain associated with nausea or diarrhea.  You have pain when you urinate or have a bowel movement.  You experience abdominal pain that wakes you in the night.  You have abdominal pain that is worsened or improved by eating food.  You have abdominal pain that is worsened with eating fatty foods.  You have a fever. SEEK IMMEDIATE MEDICAL CARE IF:  Your pain does not go away within 2 hours.  You keep throwing up (vomiting).  Your pain is felt only in  portions of the abdomen, such as the right side or the left lower portion of the abdomen.  You pass bloody or black tarry stools. MAKE SURE YOU:  Understand these instructions.  Will watch your condition.  Will get help right away if you are not doing well or get worse.   This information is not intended to replace advice given to you by your health care provider. Make sure you discuss any questions you have with your health care provider.   Document Released: 06/03/2005 Document Revised: 05/15/2015 Document Reviewed: 05/03/2013 Elsevier Interactive Patient Education [employer].

## 2016-04-06 ENCOUNTER — Encounter (HOSPITAL_COMMUNITY): Payer: Self-pay

## 2016-04-06 NOTE — Pre-Procedure Instructions (Signed)
[name]  04/06/2016      [facility] [place] [place] [address] Phone: [phone] Fax: [phone]  [facility] [place], [address] [place] [address] Phone: [phone] Fax: [phone]  [facility] [address] [place], [place] - [address] [place] [address] Phone: [phone] Fax: [phone]    Your procedure is scheduled on August 7  Report to [facility] at 0800 A.M.  Call this number if you have problems the morning of surgery:  [phone]   Remember:  Do not eat food or drink liquids after midnight.   Take these medicines the morning of surgery with A SIP OF WATER levothyroxine( synthroid), metoprolol (lopressor), tramadol (ultram) if needed  7 days prior to surgery STOP taking any Aspirin, Aleve, Naproxen, Ibuprofen, Motrin, Advil, Goody's, BC's, all herbal medications, fish oil, and all vitamins  Stop Xarelto 5 days prior to surgery    Do not wear jewelry, make-up or nail polish.  Do not wear lotions, powders, or perfumes.  You may  NOT wear deoderant.  Do not shave 48 hours prior to surgery.    Do not bring valuables to the hospital.  [facility] is not responsible for any belongings or valuables.  Contacts, dentures or bridgework may not be worn into surgery.  Leave your suitcase in the car.  After surgery it may be brought to your room.  For patients admitted to the hospital, discharge time will be determined by your treatment team.  Patients discharged the day of surgery will not be allowed to drive home.    Special instructions:   [name]- Preparing For Surgery  Before surgery, you can play an important role. Because skin is not sterile, your skin needs to be as free  of germs as possible. You can reduce the number of germs on your skin by washing with CHG (chlorahexidine gluconate) Soap before surgery.  CHG is an antiseptic cleaner which kills germs and bonds with the skin to continue killing germs even after washing.  Please do not use if you have an allergy to CHG or antibacterial soaps. If your skin becomes reddened/irritated stop using the CHG.  Do not shave (including legs and underarms) for at least 48 hours prior to first CHG shower. It is OK to shave your face.  Please follow these instructions carefully.   1. Shower the NIGHT BEFORE SURGERY and the MORNING OF SURGERY with CHG.   2. If you chose to wash your hair, wash your hair first as usual with your normal shampoo.  3. After you shampoo, rinse your hair and body thoroughly to remove the shampoo.  4. Use CHG as you would any other liquid soap. You can apply CHG directly to the skin and wash gently with a scrungie or a clean washcloth.   5. Apply the CHG Soap to your body ONLY FROM THE NECK DOWN.  Do not use on open wounds or open sores. Avoid contact with your eyes, ears, mouth and genitals (private parts). Wash genitals (private parts) with your normal soap.  6. Wash thoroughly, paying special attention to the area where your surgery will be performed.  7. Thoroughly rinse your body with warm water from the neck down.  8. DO NOT shower/wash with your normal soap after using and rinsing off the CHG Soap.  9. Pat yourself dry with a CLEAN TOWEL.   10. Wear CLEAN PAJAMAS   11. Place CLEAN SHEETS on your bed the night of your first shower and DO NOT SLEEP WITH PETS.    Day of Surgery: Do not apply any deodorants/lotions. Please wear clean clothes to the hospital/surgery center.      Please read over the following fact sheets that you were given. Pain Booklet, Coughing and Deep Breathing and Surgical Site Infection Prevention

## 2016-04-07 ENCOUNTER — Encounter (HOSPITAL_COMMUNITY)
Admission: RE | Admit: 2016-04-07 | Discharge: 2016-04-07 | Disposition: A | Discharge status: Home / Self Care | Payer: 59 | Source: Ambulatory Visit | Attending: Surgery | Admitting: Surgery

## 2016-04-07 ENCOUNTER — Encounter (HOSPITAL_COMMUNITY): Payer: Self-pay

## 2016-04-07 DIAGNOSIS — E039 Hypothyroidism, unspecified: Secondary | ICD-10-CM | POA: Diagnosis not present

## 2016-04-07 DIAGNOSIS — Z86711 Personal history of pulmonary embolism: Secondary | ICD-10-CM | POA: Diagnosis not present

## 2016-04-07 DIAGNOSIS — Z79899 Other long term (current) drug therapy: Secondary | ICD-10-CM | POA: Insufficient documentation

## 2016-04-07 DIAGNOSIS — Z7901 Long term (current) use of anticoagulants: Secondary | ICD-10-CM | POA: Diagnosis not present

## 2016-04-07 DIAGNOSIS — Z01812 Encounter for preprocedural laboratory examination: Secondary | ICD-10-CM | POA: Insufficient documentation

## 2016-04-07 DIAGNOSIS — K432 Incisional hernia without obstruction or gangrene: Secondary | ICD-10-CM | POA: Diagnosis not present

## 2016-04-07 DIAGNOSIS — Z01818 Encounter for other preprocedural examination: Secondary | ICD-10-CM | POA: Insufficient documentation

## 2016-04-07 DIAGNOSIS — I10 Essential (primary) hypertension: Secondary | ICD-10-CM | POA: Insufficient documentation

## 2016-04-07 HISTORY — DX: Adverse effect of unspecified anesthetic, initial encounter: T41.45XA

## 2016-04-07 HISTORY — DX: Reserved for inherently not codable concepts without codable children: IMO0001

## 2016-04-07 HISTORY — DX: Other complications of anesthesia, initial encounter: T88.59XA

## 2016-04-07 LAB — BASIC METABOLIC PANEL
Anion gap: 4 — ABNORMAL LOW (ref 5–15)
BUN: 11 mg/dL (ref 6–20)
CALCIUM: 9.4 mg/dL (ref 8.9–10.3)
CHLORIDE: 108 mmol/L (ref 101–111)
CO2: 26 mmol/L (ref 22–32)
CREATININE: 0.93 mg/dL (ref 0.44–1.00)
GFR calc non Af Amer: >60 mL/min (ref >60)
Glucose, Bld: 86 mg/dL (ref 65–99)
Potassium: 4.1 mmol/L (ref 3.5–5.1)
SODIUM: 138 mmol/L (ref 135–145)

## 2016-04-07 LAB — CBC
HCT: 37.9 % (ref 36.0–46.0)
Hemoglobin: 12.9 g/dL (ref 12.0–15.0)
MCH: 31.9 pg (ref 26.0–34.0)
MCHC: 34 g/dL (ref 30.0–36.0)
MCV: 93.6 fL (ref 78.0–100.0)
PLATELETS: 346 10*3/uL (ref 150–400)
RBC: 4.05 MIL/uL (ref 3.87–5.11)
RDW: 12 % (ref 11.5–15.5)
WBC: 6.8 10*3/uL (ref 4.0–10.5)

## 2016-04-07 LAB — HCG, SERUM, QUALITATIVE: PREG SERUM: NEGATIVE

## 2016-04-07 NOTE — Progress Notes (Signed)
PCP - Eldridge Abrahams Cardiologist - Dr. Clydie Braun  Chest x-ray - not needed EKG - 08/24/15 Stress Test - denies ECHO - denies Cardiac Cath - denies   Patient denies shortness of breath, fever, cough and chest pain at PAT appointment   Patient was not given any instructions on how to manage her Xarelto before surgery.  Patient was told that she would be bridging to Lovenox.  Patient has not gotten a response from her cardiologist about being bridged.  I called the cardiologist office to ask about bridging and when she needs to stop Xarelto.  Cardiology office said that they discussed it back in February 25, 2016 but have not decided on what to do.  I spoke with Sonia Side at the cardiologist office about speaking with the cardiologist to figure out a plan for the patient.  I then called the Surgeon's office and spoke with Chemira who also has been trying to get an answer from the cardiology office.  She stated that in the clearance note they have the instructions state she will stop Xarelto 48-72 hours prior to surgery and be bridged to Lovenox.  Chemira will continue to work with the cardiology office to get orders for the Lovenox Bridge.  At this time there are no instructions.  Patient stated that she was not given any instructions on how to give the injections, I printed out lovenox instructions to give patient.    Will send to anesthesia for review

## 2016-04-08 NOTE — Progress Notes (Addendum)
Anesthesia Chart Review:  Pt is a 35 year old female scheduled for incisional hernia repair, insertion of mesh on 04/13/2016 with Coralie Keens, MD.    PCP is Eldridge Abrahams, NP Cardiologist is Milana Huntsman, MD (care everywhere) who has cleared pt for surgery.   PMH includes:  HTN, PE, hypothyroidism, Graves' disease. Never smoker. BMI 41  Medications include: levothyroxine, lisinopril, metoprolol, xarelto. Pt to hold xarelto 3 days before surgery.   Preoperative labs reviewed.  PT will be obtained DOS.   EKG 08/24/15: Sinus rhythm. Low voltage, precordial leads  Echo 12/09/15:  1. Normal LV systolic function, EF [id number] 2. Trace MR, TR 3. Normal PASP estimate 4. IVC normal size, normal inspiratory collapse  If no changes, I anticipate pt can proceed with surgery as scheduled.   Willeen Cass, FNP-BC [facility] Short Stay [facility]/Anesthesiology Phone: [phone] 04/09/2016 12:08 PM

## 2016-04-12 MED ORDER — CIPROFLOXACIN IN D5W 400 MG/200ML IV SOLN
400.0000 mg | INTRAVENOUS | Status: AC
  Administered 2016-04-13: 400 mg via INTRAVENOUS
  Filled 2016-04-12: qty 200

## 2016-04-12 NOTE — H&P (Signed)
[name]  Location: [facility] Patient #: [id number] DOB: Jul 09, 1981 Married / Language: English / Race: Black or African American Female   History of Present Illness ([name] A. [name] MD;  The patient is a 35 year old female who presents with an incisional hernia. This is a pleasant female referred by Eldridge Abrahams FNP for evaluation of an incisional hernia. The patient has had a known hernia for at least 4 years. It is getting larger and causes her to have occasional discomfort. The pain is mild and an ache. There is no nausea or vomiting or obstructive symptoms. 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 She is otherwise without complaints. Her only previous surgical history was a C-section.   Other Problems  Cholelithiasis High blood pressure Other disease, cancer, significant illness Pulmonary Embolism / Blood Clot in Legs  Past Surgical History  Cesarean Section - 1  Diagnostic Studies History  Colonoscopy never Mammogram never Pap Smear 1-5 years ago  Allergies  Penicillin VK *PENICILLINS*  Medication History  TraMADol HCl (50MG  Tablet, Oral) Active. Methocarbamol (500MG  Tablet, Oral) Active. Metoprolol Succinate (50MG  Tablet ER, Oral) Active. Xarelto (20MG  Tablet, Oral) Active. Medications Reconciled  Social History  Caffeine use Carbonated beverages. No alcohol use No drug use Tobacco use Never smoker.  Family History  Alcohol Abuse Brother, Sister. Arthritis Father, Mother. Colon Polyps Mother. Diabetes Mellitus Brother, Father, Mother, Sister. Hypertension Brother, Father, Mother, Sister. Kidney Disease Mother. Prostate Cancer Father. Thyroid problems Sister.  Pregnancy / Birth History   Age at menarche 42 years. Gravida 6 Maternal age 107-20 Para 6 Regular periods    Review of Systems   General Not Present-  Appetite Loss, Chills, Fatigue, Fever, Night Sweats, Weight Gain and Weight Loss. Skin Not Present- Change in Wart/Mole, Dryness, Hives, Jaundice, New Lesions, Non-Healing Wounds, Rash and Ulcer. HEENT Present- Seasonal Allergies. Not Present- Earache, Hearing Loss, Hoarseness, Nose Bleed, Oral Ulcers, Ringing in the Ears, Sinus Pain, Sore Throat, Visual Disturbances, Wears glasses/contact lenses and Yellow Eyes. Respiratory Present- Snoring. Not Present- Bloody sputum, Chronic Cough, Difficulty Breathing and Wheezing. Cardiovascular Not Present- Chest Pain, Difficulty Breathing Lying Down, Leg Cramps, Palpitations, Rapid Heart Rate, Shortness of Breath and Swelling of Extremities. Gastrointestinal Present- Abdominal Pain, Constipation, Gets full quickly at meals and Nausea. Not Present- Bloating, Bloody Stool, Change in Bowel Habits, Chronic diarrhea, Difficulty Swallowing, Excessive gas, Hemorrhoids, Indigestion, Rectal Pain and Vomiting. Female Genitourinary Not Present- Frequency, Nocturia, Painful Urination, Pelvic Pain and Urgency. Musculoskeletal Present- Muscle Pain and Muscle Weakness. Not Present- Back Pain, Joint Pain, Joint Stiffness and Swelling of Extremities. Neurological Not Present- Decreased Memory, Fainting, Headaches, Numbness, Seizures, Tingling, Tremor, Trouble walking and Weakness. Psychiatric Not Present- Anxiety, Bipolar, Change in Sleep Pattern, Depression, Fearful and Frequent crying. Endocrine Not Present- Cold Intolerance, Excessive Hunger, Hair Changes, Heat Intolerance, Hot flashes and New Diabetes. Hematology Not Present- Easy Bruising, Excessive bleeding, Gland problems, HIV and Persistent Infections.  Vitals (  Weight: 211.8 lb Height: 61in Body Surface Area: 1.94 m Body Mass Index: 40.02 kg/m  Temp.: 97.62F(Temporal)  Pulse: 65 (Regular)  BP: 136/74 (Sitting, Left Arm, Standard)       Physical Exam  General Mental Status-Alert. General  Appearance-Consistent with stated age. Hydration-Well hydrated. Voice-Normal. Note: Morbidly obese   Head and Neck Head-normocephalic, atraumatic with no lesions or palpable masses. Trachea-midline.  Eye Eyeball - Bilateral-Extraocular movements intact. Sclera/Conjunctiva - Bilateral-No scleral icterus.  Chest and Lung Exam  Chest and lung exam reveals -quiet, even and easy respiratory effort with no use of accessory muscles and on auscultation, normal breath sounds, no adventitious sounds and normal vocal resonance. Inspection Chest Wall - Normal. Back - normal.  Cardiovascular Cardiovascular examination reveals -normal heart sounds, regular rate and rhythm with no murmurs and normal pedal pulses bilaterally.  Abdomen Inspection Skin - Scar - no surgical scars. Hernias - Incisional - Reducible. Note: There is a very large incisional hernia in her central abdomen which is reducible. Palpation/Percussion Palpation and Percussion of the abdomen reveal - Soft, Non Tender, No Rebound tenderness, No Rigidity (guarding) and No hepatosplenomegaly. Auscultation Auscultation of the abdomen reveals - Bowel sounds normal.  Neurologic Neurologic evaluation reveals -alert and oriented x 3 with no impairment of recent or remote memory. Mental Status-Normal.  Musculoskeletal Normal Exam - Left-Upper Extremity Strength Normal and Lower Extremity Strength Normal. Normal Exam - Right-Upper Extremity Strength Normal, Lower Extremity Weakness.    Assessment & Plan   INCISIONAL HERNIA, WITHOUT OBSTRUCTION OR GANGRENE (K43.2)  Impression: I have the report of her CAT scan but do not have the images. This shows a 9 cm x 9 cm fascial defect. There is nonobstructed transverse colon in the hernia. I discussed the diagnosis with her in detail.  Cardiac clearance has been achieved.  The plan will be to proceed with an incisional hernia repair with mesh.  I discussed the  risks which include but are not limited to bleeding, infection, injury to surrounding structures, recurrent hernia, cardiopulmonary issues, DVT, post op recovery, etc.  She agrees to proceed.

## 2016-04-13 ENCOUNTER — Encounter (HOSPITAL_COMMUNITY)
Admission: RE | Disposition: A | Discharge status: Home / Self Care | Payer: Self-pay | Source: Ambulatory Visit | Attending: Surgery

## 2016-04-13 ENCOUNTER — Inpatient Hospital Stay (HOSPITAL_COMMUNITY): Payer: 59 | Admitting: Certified Registered"

## 2016-04-13 ENCOUNTER — Inpatient Hospital Stay (HOSPITAL_COMMUNITY)
Admission: RE | Admit: 2016-04-13 | Discharge: 2016-04-22 | DRG: 354 | Disposition: A | Discharge status: Home / Self Care | Payer: 59 | Source: Ambulatory Visit | Attending: Surgery | Admitting: Surgery

## 2016-04-13 ENCOUNTER — Encounter (HOSPITAL_COMMUNITY): Payer: Self-pay | Admitting: *Deleted

## 2016-04-13 ENCOUNTER — Inpatient Hospital Stay (HOSPITAL_COMMUNITY): Payer: 59 | Admitting: Emergency Medicine

## 2016-04-13 DIAGNOSIS — Z6841 Body Mass Index (BMI) 40.0 and over, adult: Secondary | ICD-10-CM | POA: Diagnosis not present

## 2016-04-13 DIAGNOSIS — K432 Incisional hernia without obstruction or gangrene: Secondary | ICD-10-CM | POA: Diagnosis present

## 2016-04-13 DIAGNOSIS — Z7901 Long term (current) use of anticoagulants: Secondary | ICD-10-CM | POA: Diagnosis not present

## 2016-04-13 DIAGNOSIS — Z79899 Other long term (current) drug therapy: Secondary | ICD-10-CM | POA: Diagnosis not present

## 2016-04-13 DIAGNOSIS — Z880 Allergy status to penicillin: Secondary | ICD-10-CM | POA: Diagnosis not present

## 2016-04-13 DIAGNOSIS — Z86711 Personal history of pulmonary embolism: Secondary | ICD-10-CM | POA: Diagnosis not present

## 2016-04-13 DIAGNOSIS — Z86718 Personal history of other venous thrombosis and embolism: Secondary | ICD-10-CM | POA: Diagnosis not present

## 2016-04-13 DIAGNOSIS — R140 Abdominal distension (gaseous): Secondary | ICD-10-CM

## 2016-04-13 HISTORY — PX: INSERTION OF MESH: SHX5868

## 2016-04-13 HISTORY — PX: INCISIONAL HERNIA REPAIR: SHX193

## 2016-04-13 LAB — PROTIME-INR
INR: 0.97
Prothrombin Time: 12.9 seconds (ref 11.4–15.2)

## 2016-04-13 SURGERY — REPAIR, HERNIA, INCISIONAL
Anesthesia: General | Site: Abdomen

## 2016-04-13 MED ORDER — MORPHINE SULFATE 2 MG/ML IV SOLN
INTRAVENOUS | Status: AC
  Filled 2016-04-13: qty 25

## 2016-04-13 MED ORDER — METOPROLOL TARTRATE 50 MG PO TABS
50.0000 mg | ORAL_TABLET | Freq: Once | ORAL | Status: AC
  Administered 2016-04-13: 50 mg via ORAL

## 2016-04-13 MED ORDER — LISINOPRIL 5 MG PO TABS
5.0000 mg | ORAL_TABLET | Freq: Every day | ORAL | Status: DC
  Administered 2016-04-14 – 2016-04-22 (×9): 5 mg via ORAL
  Filled 2016-04-13 (×9): qty 1

## 2016-04-13 MED ORDER — DEXAMETHASONE SODIUM PHOSPHATE 10 MG/ML IJ SOLN
INTRAMUSCULAR | Status: DC | PRN
  Administered 2016-04-13: 10 mg via INTRAVENOUS

## 2016-04-13 MED ORDER — CHLORHEXIDINE GLUCONATE CLOTH 2 % EX PADS: 6.0000 | MEDICATED_PAD | Freq: Once | CUTANEOUS | Status: DC

## 2016-04-13 MED ORDER — 0.9 % SODIUM CHLORIDE (POUR BTL) OPTIME
TOPICAL | Status: DC | PRN
  Administered 2016-04-13 (×3): 1000 mL

## 2016-04-13 MED ORDER — FENTANYL CITRATE (PF) 100 MCG/2ML IJ SOLN
INTRAMUSCULAR | Status: DC | PRN
  Administered 2016-04-13 (×3): 50 ug via INTRAVENOUS
  Administered 2016-04-13: 100 ug via INTRAVENOUS
  Administered 2016-04-13 (×2): 50 ug via INTRAVENOUS

## 2016-04-13 MED ORDER — MEPERIDINE HCL 25 MG/ML IJ SOLN: 6.2500 mg | INTRAMUSCULAR | Status: DC | PRN

## 2016-04-13 MED ORDER — NALOXONE HCL 0.4 MG/ML IJ SOLN
0.4000 mg | INTRAMUSCULAR | Status: DC | PRN
  Filled 2016-04-13: qty 1

## 2016-04-13 MED ORDER — DIPHENHYDRAMINE HCL 12.5 MG/5ML PO ELIX
12.5000 mg | ORAL_SOLUTION | Freq: Four times a day (QID) | ORAL | Status: DC | PRN
  Filled 2016-04-13: qty 5

## 2016-04-13 MED ORDER — BUPIVACAINE-EPINEPHRINE (PF) 0.25% -1:200000 IJ SOLN
INTRAMUSCULAR | Status: AC
  Filled 2016-04-13: qty 30

## 2016-04-13 MED ORDER — ENOXAPARIN SODIUM 40 MG/0.4ML ~~LOC~~ SOLN: 40.0000 mg | SUBCUTANEOUS | Status: DC

## 2016-04-13 MED ORDER — MIDAZOLAM HCL 2 MG/2ML IJ SOLN
INTRAMUSCULAR | Status: AC
  Filled 2016-04-13: qty 2

## 2016-04-13 MED ORDER — LACTATED RINGERS IV SOLN
INTRAVENOUS | Status: DC
  Administered 2016-04-13 (×3): via INTRAVENOUS

## 2016-04-13 MED ORDER — POTASSIUM CHLORIDE IN NACL 20-0.9 MEQ/L-% IV SOLN
INTRAVENOUS | Status: DC
  Administered 2016-04-13 – 2016-04-17 (×6): via INTRAVENOUS
  Administered 2016-04-18: 1 mL via INTRAVENOUS
  Administered 2016-04-19 – 2016-04-22 (×5): via INTRAVENOUS
  Filled 2016-04-13 (×16): qty 1000

## 2016-04-13 MED ORDER — LIDOCAINE HCL (CARDIAC) 20 MG/ML IV SOLN
INTRAVENOUS | Status: DC | PRN
  Administered 2016-04-13: 80 mg via INTRAVENOUS
  Administered 2016-04-13: 20 mg via INTRAVENOUS
  Administered 2016-04-13: 60 mg via INTRATRACHEAL

## 2016-04-13 MED ORDER — ROCURONIUM BROMIDE 100 MG/10ML IV SOLN
INTRAVENOUS | Status: DC | PRN
  Administered 2016-04-13: 50 mg via INTRAVENOUS
  Administered 2016-04-13 (×2): 10 mg via INTRAVENOUS

## 2016-04-13 MED ORDER — FENTANYL CITRATE (PF) 250 MCG/5ML IJ SOLN
INTRAMUSCULAR | Status: AC
Start: 2016-04-13 — End: 2016-04-13
  Filled 2016-04-13: qty 5

## 2016-04-13 MED ORDER — ONDANSETRON HCL 4 MG/2ML IJ SOLN
INTRAMUSCULAR | Status: DC | PRN
  Administered 2016-04-13 (×2): 4 mg via INTRAVENOUS

## 2016-04-13 MED ORDER — SUGAMMADEX SODIUM 200 MG/2ML IV SOLN
INTRAVENOUS | Status: AC
  Filled 2016-04-13: qty 2

## 2016-04-13 MED ORDER — METOPROLOL TARTRATE 50 MG PO TABS
50.0000 mg | ORAL_TABLET | Freq: Two times a day (BID) | ORAL | Status: DC
  Administered 2016-04-13 – 2016-04-22 (×17): 50 mg via ORAL
  Filled 2016-04-13 (×5): qty 1
  Filled 2016-04-13: qty 2
  Filled 2016-04-13 (×12): qty 1

## 2016-04-13 MED ORDER — PROPOFOL 10 MG/ML IV BOLUS
INTRAVENOUS | Status: DC | PRN
  Administered 2016-04-13: 200 mg via INTRAVENOUS

## 2016-04-13 MED ORDER — MORPHINE SULFATE 2 MG/ML IV SOLN
INTRAVENOUS | Status: DC
  Administered 2016-04-13: 6 mg via INTRAVENOUS
  Administered 2016-04-13: 13.5 mg via INTRAVENOUS
  Administered 2016-04-13: 13:00:00 via INTRAVENOUS
  Administered 2016-04-14 (×2): 3 mg via INTRAVENOUS
  Administered 2016-04-14: 1.5 mg via INTRAVENOUS
  Administered 2016-04-14: 3 mg via INTRAVENOUS
  Administered 2016-04-14: 0 mg via INTRAVENOUS
  Administered 2016-04-15: 5.5 mg via INTRAVENOUS
  Administered 2016-04-15: 1.5 mg via INTRAVENOUS
  Administered 2016-04-15: 4.5 mg via INTRAVENOUS
  Administered 2016-04-15: 0 mg via INTRAVENOUS
  Administered 2016-04-15: 3 mg via INTRAVENOUS
  Administered 2016-04-15: 08:00:00 via INTRAVENOUS
  Administered 2016-04-16: 4.5 mg via INTRAVENOUS
  Administered 2016-04-16: 3 mg via INTRAVENOUS
  Filled 2016-04-13: qty 25

## 2016-04-13 MED ORDER — ONDANSETRON HCL 4 MG/2ML IJ SOLN
4.0000 mg | Freq: Four times a day (QID) | INTRAMUSCULAR | Status: DC | PRN
  Filled 2016-04-13: qty 2

## 2016-04-13 MED ORDER — METOPROLOL TARTRATE 50 MG PO TABS
ORAL_TABLET | ORAL | Status: AC
  Filled 2016-04-13: qty 1

## 2016-04-13 MED ORDER — LEVOTHYROXINE SODIUM 112 MCG PO TABS
112.0000 ug | ORAL_TABLET | Freq: Every day | ORAL | Status: DC
  Administered 2016-04-14 – 2016-04-22 (×9): 112 ug via ORAL
  Filled 2016-04-13 (×9): qty 1

## 2016-04-13 MED ORDER — SODIUM CHLORIDE 0.9% FLUSH: 9.0000 mL | INTRAVENOUS | Status: DC | PRN

## 2016-04-13 MED ORDER — FENTANYL CITRATE (PF) 250 MCG/5ML IJ SOLN
INTRAMUSCULAR | Status: AC
  Filled 2016-04-13: qty 5

## 2016-04-13 MED ORDER — ONDANSETRON 4 MG PO TBDP: 4.0000 mg | ORAL_TABLET | Freq: Four times a day (QID) | ORAL | Status: DC | PRN

## 2016-04-13 MED ORDER — DIPHENHYDRAMINE HCL 25 MG PO CAPS: 25.0000 mg | ORAL_CAPSULE | Freq: Four times a day (QID) | ORAL | Status: DC | PRN

## 2016-04-13 MED ORDER — ROCURONIUM BROMIDE 10 MG/ML (PF) SYRINGE
PREFILLED_SYRINGE | INTRAVENOUS | Status: AC
  Filled 2016-04-13: qty 20

## 2016-04-13 MED ORDER — DIPHENHYDRAMINE HCL 50 MG/ML IJ SOLN: 25.0000 mg | Freq: Four times a day (QID) | INTRAMUSCULAR | Status: DC | PRN

## 2016-04-13 MED ORDER — DEXAMETHASONE SODIUM PHOSPHATE 10 MG/ML IJ SOLN
INTRAMUSCULAR | Status: AC
  Filled 2016-04-13: qty 1

## 2016-04-13 MED ORDER — SUGAMMADEX SODIUM 200 MG/2ML IV SOLN
INTRAVENOUS | Status: DC | PRN
  Administered 2016-04-13: 200 mg via INTRAVENOUS

## 2016-04-13 MED ORDER — HYDROMORPHONE HCL 1 MG/ML IJ SOLN: 0.2500 mg | INTRAMUSCULAR | Status: DC | PRN

## 2016-04-13 MED ORDER — ONDANSETRON HCL 4 MG/2ML IJ SOLN
INTRAMUSCULAR | Status: AC
  Filled 2016-04-13: qty 2

## 2016-04-13 MED ORDER — PROPOFOL 10 MG/ML IV BOLUS
INTRAVENOUS | Status: AC
  Filled 2016-04-13: qty 20

## 2016-04-13 MED ORDER — MIDAZOLAM HCL 5 MG/5ML IJ SOLN
INTRAMUSCULAR | Status: DC | PRN
  Administered 2016-04-13: 2 mg via INTRAVENOUS

## 2016-04-13 MED ORDER — ONDANSETRON HCL 4 MG/2ML IJ SOLN
4.0000 mg | Freq: Four times a day (QID) | INTRAMUSCULAR | Status: DC | PRN
  Administered 2016-04-13 – 2016-04-22 (×8): 4 mg via INTRAVENOUS
  Filled 2016-04-13 (×8): qty 2

## 2016-04-13 MED ORDER — ONDANSETRON HCL 4 MG/2ML IJ SOLN: 4.0000 mg | Freq: Once | INTRAMUSCULAR | Status: DC | PRN

## 2016-04-13 MED ORDER — LIDOCAINE 2% (20 MG/ML) 5 ML SYRINGE
INTRAMUSCULAR | Status: AC
  Filled 2016-04-13: qty 10

## 2016-04-13 MED ORDER — ONDANSETRON HCL 4 MG/2ML IJ SOLN
INTRAMUSCULAR | Status: AC
Start: 2016-04-13 — End: 2016-04-13
  Filled 2016-04-13: qty 2

## 2016-04-13 MED ORDER — DIPHENHYDRAMINE HCL 50 MG/ML IJ SOLN
12.5000 mg | Freq: Four times a day (QID) | INTRAMUSCULAR | Status: DC | PRN
  Filled 2016-04-13: qty 0.25

## 2016-04-13 SURGICAL SUPPLY — 58 items
BLADE SURG ROTATE 9660 (MISCELLANEOUS) IMPLANT
CANISTER SUCTION 2500CC (MISCELLANEOUS) ×3 IMPLANT
CHLORAPREP W/TINT 26ML (MISCELLANEOUS) ×3 IMPLANT
COVER SURGICAL LIGHT HANDLE (MISCELLANEOUS) ×3 IMPLANT
DEVICE TROCAR PUNCTURE CLOSURE (ENDOMECHANICALS) ×2 IMPLANT
DRAIN CHANNEL 19F RND (DRAIN) ×2 IMPLANT
DRAPE LAPAROSCOPIC ABDOMINAL (DRAPES) ×3 IMPLANT
DRAPE UTILITY XL STRL (DRAPES) ×2 IMPLANT
DRSG TEGADERM 4X4.75 (GAUZE/BANDAGES/DRESSINGS) ×3 IMPLANT
ELECT CAUTERY BLADE 6.4 (BLADE) ×3 IMPLANT
ELECT REM PT RETURN 9FT ADLT (ELECTROSURGICAL) ×3
ELECTRODE REM PT RTRN 9FT ADLT (ELECTROSURGICAL) ×1 IMPLANT
EVACUATOR SILICONE 100CC (DRAIN) ×2 IMPLANT
GAUZE SPONGE 4X4 12PLY STRL (GAUZE/BANDAGES/DRESSINGS) ×3 IMPLANT
GLOVE BIO SURGEON STRL SZ7 (GLOVE) ×4 IMPLANT
GLOVE BIO SURGEON STRL SZ7.5 (GLOVE) ×2 IMPLANT
GLOVE BIOGEL PI IND STRL 6.5 (GLOVE) IMPLANT
GLOVE BIOGEL PI IND STRL 7.0 (GLOVE) IMPLANT
GLOVE BIOGEL PI IND STRL 7.5 (GLOVE) IMPLANT
GLOVE BIOGEL PI INDICATOR 6.5 (GLOVE) ×2
GLOVE BIOGEL PI INDICATOR 7.0 (GLOVE) ×2
GLOVE BIOGEL PI INDICATOR 7.5 (GLOVE) ×4
GLOVE SURG SIGNA 7.5 PF LTX (GLOVE) ×5 IMPLANT
GLOVE SURG SS PI 6.5 STRL IVOR (GLOVE) ×2 IMPLANT
GOWN STRL REUS W/ TWL LRG LVL3 (GOWN DISPOSABLE) ×1 IMPLANT
GOWN STRL REUS W/ TWL XL LVL3 (GOWN DISPOSABLE) ×1 IMPLANT
GOWN STRL REUS W/TWL LRG LVL3 (GOWN DISPOSABLE) ×6
GOWN STRL REUS W/TWL XL LVL3 (GOWN DISPOSABLE) ×6
HANDLE SUCTION POOLE (INSTRUMENTS) IMPLANT
KIT BASIN OR (CUSTOM PROCEDURE TRAY) ×3 IMPLANT
KIT ROOM TURNOVER OR (KITS) ×3 IMPLANT
LIQUID BAND (GAUZE/BANDAGES/DRESSINGS) ×2 IMPLANT
MARKER SKIN DUAL TIP RULER LAB (MISCELLANEOUS) ×2 IMPLANT
MESH VENTRALIGHT ST 10X13IN (Mesh General) ×2 IMPLANT
NDL HYPO 25GX1X1/2 BEV (NEEDLE) ×1 IMPLANT
NEEDLE HYPO 25GX1X1/2 BEV (NEEDLE) IMPLANT
NS IRRIG 1000ML POUR BTL (IV SOLUTION) ×3 IMPLANT
PACK GENERAL/GYN (CUSTOM PROCEDURE TRAY) ×3 IMPLANT
PAD ARMBOARD 7.5X6 YLW CONV (MISCELLANEOUS) ×3 IMPLANT
SPONGE GAUZE 4X4 12PLY STER LF (GAUZE/BANDAGES/DRESSINGS) ×2 IMPLANT
SPONGE LAP 18X18 X RAY DECT (DISPOSABLE) ×2 IMPLANT
STAPLER VISISTAT 35W (STAPLE) ×2 IMPLANT
SUCTION POOLE HANDLE (INSTRUMENTS) ×3
SUT ETHILON 2 0 FS 18 (SUTURE) ×2 IMPLANT
SUT MNCRL AB 4-0 PS2 18 (SUTURE) ×1 IMPLANT
SUT NOVA 1 T20/GS 25DT (SUTURE) ×2 IMPLANT
SUT NOVA NAB DX-16 0-1 5-0 T12 (SUTURE) IMPLANT
SUT PDS AB 1 TP1 96 (SUTURE) ×4 IMPLANT
SUT PROLENE 1 CT (SUTURE) IMPLANT
SUT SILK 2 0 TIES 10X30 (SUTURE) ×2 IMPLANT
SUT VIC AB 2-0 CT1 27 (SUTURE) ×18
SUT VIC AB 2-0 CT1 TAPERPNT 27 (SUTURE) IMPLANT
SUT VIC AB 3-0 SH 27 (SUTURE)
SUT VIC AB 3-0 SH 27XBRD (SUTURE) ×1 IMPLANT
SYR CONTROL 10ML LL (SYRINGE) ×1 IMPLANT
TOWEL OR 17X24 6PK STRL BLUE (TOWEL DISPOSABLE) ×1 IMPLANT
TOWEL OR 17X26 10 PK STRL BLUE (TOWEL DISPOSABLE) ×3 IMPLANT
TRAY FOLEY CATH 14FRSI W/METER (CATHETERS) ×2 IMPLANT

## 2016-04-13 NOTE — Op Note (Signed)
INCISIONAL HERNIA REPAIR, INSERTION OF MESH  Procedure Note  [name] 04/13/2016   Pre-op Diagnosis: incisional hernia     Post-op Diagnosis: same  Procedure(s): INCISIONAL HERNIA REPAIR with TRANSVERSE ABDOMINAL MUSCLE RELEASE (TAR) INSERTION OF MESH  Surgeon(s): Coralie Keens, MD Rolm Bookbinder, MD  Anesthesia: General  Staff:  Circulator: Nicholos Johns, RN; Milas Kocher, RN Relief Circulator: Kara Mead, RN Relief Scrub: Kara Mead, RN; Quincy Carnes, RN Scrub Person: Romero Liner, CST  Estimated Blood Loss: 200 mL           [name]   Date: 04/13/2016  Time: 12:38 PM

## 2016-04-13 NOTE — Transfer of Care (Signed)
Immediate Anesthesia Transfer of Care Note  Patient: [name]  Procedure(s) Performed: Procedure(s): [place] (N/A) INSERTION OF MESH (N/A)  Patient Location: PACU  Anesthesia Type:General  Level of Consciousness:  sedated, patient cooperative and responds to stimulation  Airway & Oxygen Therapy:Patient Spontanous Breathing and Patient connected to face mask oxgen  Post-op Assessment:  Report given to PACU RN and Post -op Vital signs reviewed and stable  Post vital signs:  Reviewed and stable  Last Vitals:  Vitals:   04/13/16 0827 04/13/16 1301  BP: (!) 149/93   Pulse: 73   Resp: 18   Temp: 36.9 C [id number] C    Complications: No apparent anesthesia complications

## 2016-04-13 NOTE — Anesthesia Postprocedure Evaluation (Signed)
Anesthesia Post Note  Patient: [name]  Procedure(s) Performed: Procedure(s) (LRB): INCISIONAL HERNIA REPAIR (N/A) INSERTION OF MESH (N/A)  Patient location during evaluation: PACU Anesthesia Type: General Level of consciousness: awake and alert Pain management: pain level controlled Vital Signs Assessment: post-procedure vital signs reviewed and stable Respiratory status: spontaneous breathing, nonlabored ventilation, respiratory function stable and patient connected to nasal cannula oxygen Cardiovascular status: blood pressure returned to baseline and stable Postop Assessment: no signs of nausea or vomiting Anesthetic complications: no    Last Vitals:  Vitals:   04/13/16 1445 04/13/16 1515  BP: (!) 129/92   Pulse: 77   Resp: 20   Temp:  36.4 C    Last Pain:  Vitals:   04/13/16 1515  TempSrc:   PainSc: [place] DAVID

## 2016-04-13 NOTE — Anesthesia Preprocedure Evaluation (Signed)
Anesthesia Evaluation  Patient identified by MRN, date of birth, ID band Patient awake    Reviewed: Allergy & Precautions, NPO status , Patient's Chart, lab work & pertinent test results  Airway Mallampati: I  TM Distance: >3 FB Neck ROM: Full    Dental   Pulmonary    Pulmonary exam normal       Cardiovascular hypertension, Pt. on medications Normal cardiovascular exam    Neuro/Psych    GI/Hepatic   Endo/Other  Hypothyroidism   Renal/GU      Musculoskeletal   Abdominal   Peds  Hematology   Anesthesia Other Findings   Reproductive/Obstetrics                             Anesthesia Physical Anesthesia Plan  ASA: II  Anesthesia Plan: General   Post-op Pain Management:    Induction: Intravenous  Airway Management Planned: Oral ETT  Additional Equipment:   Intra-op Plan:   Post-operative Plan: Extubation in OR  Informed Consent: I have reviewed the patients History and Physical, chart, labs and discussed the procedure including the risks, benefits and alternatives for the proposed anesthesia with the patient or authorized representative who has indicated his/her understanding and acceptance.     Plan Discussed with: CRNA and Surgeon  Anesthesia Plan Comments:         Anesthesia Quick Evaluation  

## 2016-04-13 NOTE — Anesthesia Procedure Notes (Signed)
Procedure Name: Intubation Date/Time: 04/13/2016 10:36 AM Performed by: Freddie Breech Pre-anesthesia Checklist: Patient identified, Emergency Drugs available, Suction available and Patient being monitored Patient Re-evaluated:Patient Re-evaluated prior to inductionOxygen Delivery Method: Circle System Utilized Preoxygenation: Pre-oxygenation with 100% oxygen Intubation Type: IV induction Ventilation: Mask ventilation without difficulty and Oral airway inserted - appropriate to patient size Laryngoscope Size: Mac and 3 Grade View: Grade I Tube type: Oral Number of attempts: 1 Airway Equipment and Method: Stylet and Oral airway Placement Confirmation: ETT inserted through vocal cords under direct vision,  positive ETCO2 and breath sounds checked- equal and bilateral Secured at: 22 cm Tube secured with: Tape Dental Injury: Teeth and Oropharynx as per pre-operative assessment

## 2016-04-13 NOTE — Interval H&P Note (Signed)
History and Physical Interval Note: no change in H and P  04/13/2016 9:49 AM  [name]  has presented today for surgery, with the diagnosis of incisional hernia  The various methods of treatment have been discussed with the patient and family. After consideration of risks, benefits and other options for treatment, the patient has consented to  Procedure(s): [place] (N/A) INSERTION OF MESH (N/A) as a surgical intervention .  The patient's history has been reviewed, patient examined, no change in status, stable for surgery.  I have reviewed the patient's chart and labs.  Questions were answered to the patient's satisfaction.     [name] A

## 2016-04-14 ENCOUNTER — Encounter (HOSPITAL_COMMUNITY): Payer: Self-pay | Admitting: General Practice

## 2016-04-14 LAB — CBC
HEMATOCRIT: 35.5 % — AB (ref 36.0–46.0)
HEMOGLOBIN: 11.8 g/dL — AB (ref 12.0–15.0)
MCH: 31.6 pg (ref 26.0–34.0)
MCHC: 33.2 g/dL (ref 30.0–36.0)
MCV: 95.2 fL (ref 78.0–100.0)
Platelets: 319 10*3/uL (ref 150–400)
RBC: 3.73 MIL/uL — ABNORMAL LOW (ref 3.87–5.11)
RDW: 12.2 % (ref 11.5–15.5)
WBC: 14.3 10*3/uL — ABNORMAL HIGH (ref 4.0–10.5)

## 2016-04-14 LAB — BASIC METABOLIC PANEL
Anion gap: 7 (ref 5–15)
BUN: 5 mg/dL — ABNORMAL LOW (ref 6–20)
CO2: 25 mmol/L (ref 22–32)
Calcium: 8.1 mg/dL — ABNORMAL LOW (ref 8.9–10.3)
Chloride: 104 mmol/L (ref 101–111)
Creatinine, Ser: 0.89 mg/dL (ref 0.44–1.00)
GFR calc Af Amer: >60 mL/min (ref >60)
GFR calc non Af Amer: >60 mL/min (ref >60)
Glucose, Bld: 101 mg/dL — ABNORMAL HIGH (ref 65–99)
Potassium: 4 mmol/L (ref 3.5–5.1)
Sodium: 136 mmol/L (ref 135–145)

## 2016-04-14 MED ORDER — RIVAROXABAN 20 MG PO TABS
20.0000 mg | ORAL_TABLET | Freq: Every day | ORAL | Status: DC
  Administered 2016-04-14 – 2016-04-21 (×8): 20 mg via ORAL
  Filled 2016-04-14 (×8): qty 1

## 2016-04-14 MED ORDER — KETOROLAC TROMETHAMINE 30 MG/ML IJ SOLN
30.0000 mg | Freq: Once | INTRAMUSCULAR | Status: AC
  Administered 2016-04-14: 30 mg via INTRAVENOUS
  Filled 2016-04-14: qty 1

## 2016-04-14 MED ORDER — PHENOL 1.4 % MT LIQD
1.0000 | OROMUCOSAL | Status: DC | PRN
  Filled 2016-04-14: qty 177

## 2016-04-14 NOTE — Op Note (Signed)
NAMESAPRINA, [name] NO.:  [id number]  MEDICAL RECORD NO.:  [id number]  LOCATION:  6N29C                        FACILITY:  [facility]  PHYSICIAN:  Coralie Keens, M.D. DATE OF BIRTH:  07/30/1981  DATE OF PROCEDURE:  04/13/2016 DATE OF DISCHARGE:                              OPERATIVE REPORT   PREOPERATIVE DIAGNOSIS:  Incisional hernia.  POSTOPERATIVE DIAGNOSIS:  Incisional hernia.  PROCEDURE:  Incisional hernia repair with mesh with transverse abdominal muscle release (TAR).  SURGEON:  Coralie Keens, M.D.  ASSISTANT:  West Pugh. Uvaldo Rising, MD.  ANESTHESIA:  General endotracheal anesthesia.  ESTIMATED BLOOD LOSS:  Minimal.  INDICATIONS:  Lauren 35 year old [name] with Lauren large ventral incisional hernia with Lauren 9 cm fascial defect.  Decision was made to proceed with hernia repair with mesh.  FINDINGS:  The patient was found have Lauren large incisional hernia.  There were minimal adhesions of omentum to the hernia sac.  The hernia was repaired with Lauren 25 x 33 cm piece of Bard Ventralight mesh using transverse abdominis release procedure.  PROCEDURE IN DETAIL:  The patient was brought to the operating room, identified as [name].  She was placed supine on the operating table, general anesthesia was induced.  Her abdomen was then prepped and draped in usual sterile fashion.  I made Lauren midline incision through the patient's previous scar from just above to just below the umbilicus.  I carried this down to the hernia sac with electrocautery.  I then opened up the sac and reduced all contents which contained only omentum into the abdominal cavity.  I then freed up the omentum circumferentially with electrocautery.  The patient had Lauren large fascial defect circumferentially.  I grasped each edge of the fascia with Allis clamps, and then performed an incision in the peritoneum.  I then separated the posterior fascia from the overlying rectus muscle.  I did  this circumferentially moving lateral to the neurovascular bundle.  I then had to open up the transversalis muscle and release it bilaterally in order to close the posterior peritoneum.  Once this was completed, I was able to close the peritoneum and posterior fascia with Lauren running 2-0 Vicryl suture.  I then did this with multiple sutures and closed the 2 separate new holes with 2-0 Vicryl sutures as well.  Lauren piece of 25 x 33 cm Bard Prolene Ventralight mesh was brought into the field.  I placed it over top of the peritoneum and posterior fascia and brought underneath all muscle layers.  I then made 4 separate skin incisions, used Lauren suture passer to pass #1 Novafil sutures through the mesh and back out of the fascial layers and skin.  The 4 sutures were then tied in place securing the mesh to the fascia overlying it.  At this point, I then thoroughly irrigated the wound with normal saline.  I made an incision with Lauren scalped placing Lauren 19-French Blake drain above the mesh. I then sewed the midline fascia back over the mesh with Lauren running #1 looped PDS suture.  The drain was sewn in place with Lauren nylon suture.  I then closed the skin with skin staples after irrigating with  saline. The 4 suture holes from the initial sutures were then closed with skin glue.  Occlusive dressing was then applied.  The drain was placed to bulb suction.  The patient tolerated the procedure well. All sponge, needle, and instrument counts were correct at the end of procedure.  The patient was then extubated in operating room and taken in stable condition to recovery room.     Coralie Keens, M.D.     DB/MEDQ  D:  04/13/2016  T:  04/13/2016  Job:  [id number]

## 2016-04-14 NOTE — Progress Notes (Signed)
1 Day Post-Op  Subjective: Complains of incisional pain  Objective: Vital signs in last 24 hours: Temp:  [97.5 F (36.4 C)-99.5 F (37.5 C)] 99.5 F (37.5 C) (08/08 0515) Pulse Rate:  [73-87] 87 (08/08 0515) Resp:  [14-37] 18 (08/08 0515) BP: (121-151)/(76-100) 121/81 (08/08 0515) SpO2:  [91 %-99 %] 98 % (08/08 0515) Weight:  [98 kg (216 lb)] 98 kg (216 lb) (08/07 0827) Last BM Date: 04/11/16  Intake/Output from previous day: 08/07 0701 - 08/08 0700 In: 4145.8 [I.V.:4145.8] Out: 2263 [Urine:1775; Drains:338; Blood:150] Intake/Output this shift: Total I/O In: 1372.9 [I.V.:1372.9] Out: [id number] [Urine:1075; Drains:110]  Currently ambulating  Lab Results:   Recent Labs  04/14/16 0439  WBC 14.3*  HGB 11.8*  HCT 35.5*  PLT 319   BMET  Recent Labs  04/14/16 0439  NA 136  K 4.0  CL 104  CO2 25  GLUCOSE 101*  BUN 5*  CREATININE 0.89  CALCIUM 8.1*   PT/INR  Recent Labs  04/13/16 0843  LABPROT 12.9  INR 0.97   ABG No results for input(s): PHART, HCO3 in the last 72 hours.  Invalid input(s): PCO2, PO2  Studies/Results: No results found.  Anti-infectives: Anti-infectives    Start     Dose/Rate Route Frequency Ordered Stop   04/13/16 0945  ciprofloxacin (CIPRO) IVPB 400 mg     400 mg 200 mL/hr over 60 Minutes Intravenous To ShortStay Surgical 04/12/16 1311 04/13/16 1125      Assessment/Plan: s/p Procedure(s): INCISIONAL HERNIA REPAIR (N/A) INSERTION OF MESH (N/A)  Clear liquid diet D/c foley ambulate  LOS: 1 day    Akyia Borelli A 04/14/2016

## 2016-04-15 LAB — BASIC METABOLIC PANEL
Anion gap: 7 (ref 5–15)
CHLORIDE: 107 mmol/L (ref 101–111)
CO2: 21 mmol/L — ABNORMAL LOW (ref 22–32)
CREATININE: 0.65 mg/dL (ref 0.44–1.00)
Calcium: 7.7 mg/dL — ABNORMAL LOW (ref 8.9–10.3)
GFR calc Af Amer: >60 mL/min (ref >60)
GLUCOSE: 71 mg/dL (ref 65–99)
Potassium: 4 mmol/L (ref 3.5–5.1)
SODIUM: 135 mmol/L (ref 135–145)

## 2016-04-15 LAB — CBC
HCT: 30.9 % — ABNORMAL LOW (ref 36.0–46.0)
Hemoglobin: 10.3 g/dL — ABNORMAL LOW (ref 12.0–15.0)
MCH: 32.1 pg (ref 26.0–34.0)
MCHC: 33.3 g/dL (ref 30.0–36.0)
MCV: 96.3 fL (ref 78.0–100.0)
PLATELETS: 247 10*3/uL (ref 150–400)
RBC: 3.21 MIL/uL — ABNORMAL LOW (ref 3.87–5.11)
RDW: 12.5 % (ref 11.5–15.5)
WBC: 12.9 10*3/uL — ABNORMAL HIGH (ref 4.0–10.5)

## 2016-04-15 MED ORDER — SODIUM CHLORIDE 0.9% FLUSH: 10.0000 mL | INTRAVENOUS | Status: DC | PRN

## 2016-04-15 NOTE — Progress Notes (Signed)
Attempted 2 PIVs with ultrasound without success.  Unable to thread the catheters.   Per pt she is going to be here until Saturday or Sunday.  Talked with her about the PowerGlide IV device.   She is ok with having [place] insert one.   I asked primary RN, Ander Purpura if she could call MD for pain medication to bridge her over until Powerglide could be inserted later this morning.   (On PCA).

## 2016-04-15 NOTE — Discharge Instructions (Addendum)
[facility]      [facility] °[phone] ° °OPEN ABDOMINAL SURGERY: POST OP INSTRUCTIONS ° °Always review your discharge instruction sheet given to you by the facility where your surgery was performed. ° °IF YOU HAVE DISABILITY OR FAMILY LEAVE FORMS, YOU MUST BRING THEM TO THE OFFICE FOR PROCESSING.  PLEASE DO NOT GIVE THEM TO YOUR DOCTOR. ° °1. A prescription for pain medication may be given to you upon discharge.  Take your pain medication as prescribed, if needed.  If narcotic pain medicine is not needed, then you may take acetaminophen (Tylenol) or ibuprofen (Advil) as needed. °2. Take your usually prescribed medications unless otherwise directed. °3. If you need a refill on your pain medication, please contact your pharmacy. They will contact our office to request authorization.  Prescriptions will not be filled after 5pm or on week-ends. °4. You should follow a light diet the first few days after arrival home, such as soup and crackers, pudding, etc.unless your doctor has advised otherwise. A high-fiber, low fat diet can be resumed as tolerated.   Be sure to include lots of fluids daily. Most patients will experience some swelling and bruising on the chest and neck area.  Ice packs will help.  Swelling and bruising can take several days to resolve °5. Most patients will experience some swelling and bruising in the area of the incision. Ice pack will help. Swelling and bruising can take several days to resolve..  °6. It is common to experience some constipation if taking pain medication after surgery.  Increasing fluid intake and taking a stool softener will usually help or prevent this problem from occurring.  A mild laxative (Milk of Magnesia or Miralax) should be taken according to package directions if there are no bowel movements after 48 hours. °7.  You may have steri-strips (small skin tapes) in place directly over the incision.  These strips should be left on the skin for 7-10 days.  If your  surgeon used skin glue on the incision, you may shower in 24 hours.  The glue will flake off over the next 2-3 weeks.  Any sutures or staples will be removed at the office during your follow-up visit. You may find that a light gauze bandage over your incision may keep your staples from being rubbed or pulled. You may shower and replace the bandage daily. °8. ACTIVITIES:  You may resume regular (light) daily activities beginning the next day--such as daily self-care, walking, climbing stairs--gradually increasing activities as tolerated.  You may have sexual intercourse when it is comfortable.  Refrain from any heavy lifting or straining until approved by your doctor. °a. You may drive when you no longer are taking prescription pain medication, you can comfortably wear a seatbelt, and you can safely maneuver your car and apply brakes °b. Return to Work: ___________________________________ °9. You should see your doctor in the office for a follow-up appointment approximately two weeks after your surgery.  Make sure that you call for this appointment within a day or two after you arrive home to insure a convenient appointment time. °OTHER INSTRUCTIONS:  °_____________________________________________________________ °_____________________________________________________________ ° °WHEN TO CALL YOUR DOCTOR: °1. Fever over 101.0 °2. Inability to urinate °3. Nausea and/or vomiting °4. Extreme swelling or bruising °5. Continued bleeding from incision. °6. Increased pain, redness, or drainage from the incision. °7. Difficulty swallowing or breathing °8. Muscle cramping or spasms. °9. Numbness or tingling in hands or feet or around lips. ° °The clinic staff is available to   answer your questions during regular business hours.  Please dont hesitate to call and ask to speak to one of the nurses if you have concerns.  For further questions, please visit [email or web address] Information on my medicine - XARELTO  (rivaroxaban)  This medication education was reviewed with me or my healthcare representative as part of my discharge preparation.  The pharmacist that spoke with me during my hospital stay was:  Saundra Shelling, [place]? Xarelto was prescribed to treat blood clots that may have been found in the veins of your legs (deep vein thrombosis) or in your lungs (pulmonary embolism) and to reduce the risk of them occurring again.  What do you need to know about Xarelto? The dose is 20 mg tablet taken ONCE A DAY with your evening meal.  DO NOT stop taking Xarelto without talking to the health care provider who prescribed the medication.  Refill your prescription for 20 mg tablets before you run out.  After discharge, you should have regular check-up appointments with your healthcare provider that is prescribing your Xarelto.  In the future your dose may need to be changed if your kidney function changes by a significant amount.  What do you do if you miss a dose? If you are taking Xarelto TWICE DAILY and you miss a dose, take it as soon as you remember. You may take two 15 mg tablets (total 30 mg) at the same time then resume your regularly scheduled 15 mg twice daily the next day.  If you are taking Xarelto ONCE DAILY and you miss a dose, take it as soon as you remember on the same day then continue your regularly scheduled once daily regimen the next day. Do not take two doses of Xarelto at the same time.   Important Safety Information Xarelto is a blood thinner medicine that can cause bleeding. You should call your healthcare provider right away if you experience any of the following: ? Bleeding from an injury or your nose that does not stop. ? Unusual colored urine (red or dark brown) or unusual colored stools (red or black). ? Unusual bruising for unknown reasons. ? A serious fall or if you hit your head (even if there is no bleeding).  Some medicines  may interact with Xarelto and might increase your risk of bleeding while on Xarelto. To help avoid this, consult your healthcare provider or pharmacist prior to using any new prescription or non-prescription medications, including herbals, vitamins, non-steroidal anti-inflammatory drugs (NSAIDs) and supplements.  This website has more information on Xarelto: [email or web address]

## 2016-04-15 NOTE — Progress Notes (Signed)
2 Days Post-Op  Subjective: Reports soreness and nausea Voiding well  Objective: Vital signs in last 24 hours: Temp:  [98.4 F (36.9 C)-100.6 F (38.1 C)] 99.4 F (37.4 C) (08/09 0454) Pulse Rate:  [77-93] 81 (08/09 0454) Resp:  [16-34] 17 (08/09 0454) BP: (100-118)/(64-74) 100/72 (08/09 0454) SpO2:  [97 %-100 %] 99 % (08/09 0454) Last BM Date: 04/11/16  Intake/Output from previous day: 08/08 0701 - 08/09 0700 In: 2395.5 [P.O.:320; I.V.:2075.5] Out: 2230 [Urine:1950; Drains:280] Intake/Output this shift: Total I/O In: 2395.5 [P.O.:320; I.V.:2075.5] Out: 1530 [Urine:1350; Drains:180]  Lungs clear Abdomen soft, drain serosang  Lab Results:   Recent Labs  04/14/16 0439 04/15/16 0525  WBC 14.3* 12.9*  HGB 11.8* 10.3*  HCT 35.5* 30.9*  PLT 319 247   BMET  Recent Labs  04/14/16 0439  NA 136  K 4.0  CL 104  CO2 25  GLUCOSE 101*  BUN 5*  CREATININE 0.89  CALCIUM 8.1*   PT/INR  Recent Labs  04/13/16 0843  LABPROT 12.9  INR 0.97   ABG No results for input(s): PHART, HCO3 in the last 72 hours.  Invalid input(s): PCO2, PO2  Studies/Results: No results found.  Anti-infectives: Anti-infectives    Start     Dose/Rate Route Frequency Ordered Stop   04/13/16 0945  ciprofloxacin (CIPRO) IVPB 400 mg     400 mg 200 mL/hr over 60 Minutes Intravenous To ShortStay Surgical 04/12/16 1311 04/13/16 1125      Assessment/Plan: s/p Procedure(s): INCISIONAL HERNIA REPAIR (N/A) INSERTION OF MESH (N/A)  Keep on clears Continue PCA Ambulate Decrease IVF  LOS: 2 days    Seyon Strader A 04/15/2016

## 2016-04-16 MED ORDER — MORPHINE SULFATE (PF) 2 MG/ML IV SOLN
1.0000 mg | INTRAVENOUS | Status: DC | PRN
  Administered 2016-04-16 – 2016-04-18 (×7): 4 mg via INTRAVENOUS
  Administered 2016-04-18 – 2016-04-20 (×4): 2 mg via INTRAVENOUS
  Administered 2016-04-21: 4 mg via INTRAVENOUS
  Filled 2016-04-16: qty 1
  Filled 2016-04-16 (×3): qty 2
  Filled 2016-04-16: qty 1
  Filled 2016-04-16 (×4): qty 2
  Filled 2016-04-16 (×2): qty 1
  Filled 2016-04-16: qty 2

## 2016-04-16 NOTE — Progress Notes (Signed)
3 Days Post-Op  Subjective: Not passing flatus yet Still some nausea  Objective: Vital signs in last 24 hours: Temp:  [98.7 F (37.1 C)-99.5 F (37.5 C)] 98.7 F (37.1 C) (08/10 0434) Pulse Rate:  [77-88] 79 (08/10 0434) Resp:  [17-27] 19 (08/10 0434) BP: (99-130)/(62-79) 129/79 (08/10 0434) SpO2:  [98 %-100 %] 98 % (08/10 0434) Last BM Date: 04/11/16  Intake/Output from previous day: 08/09 0701 - 08/10 0700 In: 1210 [P.O.:610; I.V.:600] Out: 1885 [Urine:1750; Drains:135] Intake/Output this shift: Total I/O In: 1160 [P.O.:560; I.V.:600] Out: 895 [Urine:800; Drains:95]  Looks more comfortable Drain serosang Abdomen soft  Lab Results:   Recent Labs  04/14/16 0439 04/15/16 0525  WBC 14.3* 12.9*  HGB 11.8* 10.3*  HCT 35.5* 30.9*  PLT 319 247   BMET  Recent Labs  04/14/16 0439 04/15/16 0525  NA 136 135  K 4.0 4.0  CL 104 107  CO2 25 21*  GLUCOSE 101* 71  BUN 5* <5*  CREATININE 0.89 0.65  CALCIUM 8.1* 7.7*   PT/INR  Recent Labs  04/13/16 0843  LABPROT 12.9  INR 0.97   ABG No results for input(s): PHART, HCO3 in the last 72 hours.  Invalid input(s): PCO2, PO2  Studies/Results: No results found.  Anti-infectives: Anti-infectives    Start     Dose/Rate Route Frequency Ordered Stop   04/13/16 0945  ciprofloxacin (CIPRO) IVPB 400 mg     400 mg 200 mL/hr over 60 Minutes Intravenous To ShortStay Surgical 04/12/16 1311 04/13/16 1125      Assessment/Plan: s/p Procedure(s): INCISIONAL HERNIA REPAIR (N/A) INSERTION OF MESH (N/A)  D/c PCA Ambulate Keep on clears for now  LOS: 3 days    Anaiya Wisinski A 04/16/2016

## 2016-04-17 MED ORDER — BISACODYL 10 MG RE SUPP
10.0000 mg | Freq: Once | RECTAL | Status: AC
  Administered 2016-04-17: 10 mg via RECTAL
  Filled 2016-04-17: qty 1

## 2016-04-17 MED ORDER — OXYCODONE-ACETAMINOPHEN 5-325 MG PO TABS
1.0000 | ORAL_TABLET | ORAL | Status: DC | PRN
  Administered 2016-04-17: 2 via ORAL
  Administered 2016-04-17: 1 via ORAL
  Administered 2016-04-18 – 2016-04-22 (×14): 2 via ORAL
  Filled 2016-04-17 (×12): qty 2
  Filled 2016-04-17: qty 1
  Filled 2016-04-17 (×3): qty 2

## 2016-04-17 NOTE — Progress Notes (Signed)
4 Days Post-Op  Subjective: More comfortable Passed a little flatus  Objective: Vital signs in last 24 hours: Temp:  [98 F (36.7 C)-99.7 F (37.6 C)] 98 F (36.7 C) (08/11 0650) Pulse Rate:  [78-87] 87 (08/11 0650) Resp:  [17-18] 18 (08/11 0650) BP: (117-136)/(70-99) 136/86 (08/11 0650) SpO2:  [96 %-100 %] 100 % (08/11 0650) Last BM Date: 04/11/16  Intake/Output from previous day: 08/10 0701 - 08/11 0700 In: 840 [P.O.:440; I.V.:400] Out: 2230 [Urine:1150; Drains:1080] Intake/Output this shift: No intake/output data recorded.  Abdomen soft, less tender, drain serosang  Lab Results:   Recent Labs  04/15/16 0525  WBC 12.9*  HGB 10.3*  HCT 30.9*  PLT 247   BMET  Recent Labs  04/15/16 0525  NA 135  K 4.0  CL 107  CO2 21*  GLUCOSE 71  BUN <5*  CREATININE 0.65  CALCIUM 7.7*   PT/INR No results for input(s): LABPROT, INR in the last 72 hours. ABG No results for input(s): PHART, HCO3 in the last 72 hours.  Invalid input(s): PCO2, PO2  Studies/Results: No results found.  Anti-infectives: Anti-infectives    Start     Dose/Rate Route Frequency Ordered Stop   04/13/16 0945  ciprofloxacin (CIPRO) IVPB 400 mg     400 mg 200 mL/hr over 60 Minutes Intravenous To ShortStay Surgical 04/12/16 1311 04/13/16 1125      Assessment/Plan: s/p Procedure(s): INCISIONAL HERNIA REPAIR (N/A) INSERTION OF MESH (N/A)  Advance to full liquid diet Dulcolax suppos  LOS: 4 days    Sinthia Karabin A 04/17/2016

## 2016-04-18 MED ORDER — POLYETHYLENE GLYCOL 3350 17 G PO PACK
17.0000 g | PACK | Freq: Every day | ORAL | Status: DC
  Administered 2016-04-18 – 2016-04-22 (×5): 17 g via ORAL
  Filled 2016-04-18 (×5): qty 1

## 2016-04-18 NOTE — Plan of Care (Signed)
Problem: Skin Integrity: Goal: Risk for impaired skin integrity will decrease Outcome: Progressing No skin impairment issues, pt does not use gown

## 2016-04-18 NOTE — Progress Notes (Signed)
5 Days Post-Op  Subjective: Continues to improve Passing flatus but no BM yet Tolerating diet  Objective: Vital signs in last 24 hours: Temp:  [98.1 F (36.7 C)-98.9 F (37.2 C)] 98.9 F (37.2 C) (08/12 0443) Pulse Rate:  [60-84] 60 (08/12 0443) Resp:  [18-19] 19 (08/12 0443) BP: (112-140)/(79-90) 112/79 (08/12 0443) SpO2:  [100 %] 100 % (08/12 0443) Last BM Date: 04/17/16  Intake/Output from previous day: 08/11 0701 - 08/12 0700 In: 3195 [P.O.:1320; I.V.:1875] Out: 205 [Drains:205] Intake/Output this shift: No intake/output data recorded.  Abdomen soft, drain serous Incision clean  Lab Results:  No results for input(s): WBC, HGB, HCT, PLT in the last 72 hours. BMET No results for input(s): NA, K, CL, CO2, GLUCOSE, BUN, CREATININE, CALCIUM in the last 72 hours. PT/INR No results for input(s): LABPROT, INR in the last 72 hours. ABG No results for input(s): PHART, HCO3 in the last 72 hours.  Invalid input(s): PCO2, PO2  Studies/Results: No results found.  Anti-infectives: Anti-infectives    Start     Dose/Rate Route Frequency Ordered Stop   04/13/16 0945  ciprofloxacin (CIPRO) IVPB 400 mg     400 mg 200 mL/hr over 60 Minutes Intravenous To ShortStay Surgical 04/12/16 1311 04/13/16 1125      Assessment/Plan: s/p Procedure(s): INCISIONAL HERNIA REPAIR (N/A) INSERTION OF MESH (N/A)  Keep on full liquids today Shower Keep drain Decrease IVF Add Miralax  LOS: 5 days    Shirel Mallis A 04/18/2016

## 2016-04-19 MED ORDER — BISACODYL 10 MG RE SUPP
10.0000 mg | Freq: Once | RECTAL | Status: AC
  Administered 2016-04-19: 10 mg via RECTAL
  Filled 2016-04-19: qty 1

## 2016-04-19 NOTE — Progress Notes (Signed)
6 Days Post-Op  Subjective: Had nausea yesterday Still no BM  Objective: Vital signs in last 24 hours: Temp:  [98.7 F (37.1 C)-99.3 F (37.4 C)] 98.7 F (37.1 C) (08/13 0520) Pulse Rate:  [66-81] 77 (08/13 0520) Resp:  [17-18] 17 (08/13 0520) BP: (132-142)/(78-96) 142/87 (08/13 0520) SpO2:  [97 %-99 %] 99 % (08/13 0520) Last BM Date: 04/11/16  Intake/Output from previous day: 08/12 0701 - 08/13 0700 In: 480 [P.O.:480] Out: 655 [Urine:300; Drains:355] Intake/Output this shift: No intake/output data recorded.  Exam: Abdomen soft, non distended Drain serosang  Lab Results:  No results for input(s): WBC, HGB, HCT, PLT in the last 72 hours. BMET No results for input(s): NA, K, CL, CO2, GLUCOSE, BUN, CREATININE, CALCIUM in the last 72 hours. PT/INR No results for input(s): LABPROT, INR in the last 72 hours. ABG No results for input(s): PHART, HCO3 in the last 72 hours.  Invalid input(s): PCO2, PO2  Studies/Results: No results found.  Anti-infectives: Anti-infectives    Start     Dose/Rate Route Frequency Ordered Stop   04/13/16 0945  ciprofloxacin (CIPRO) IVPB 400 mg     400 mg 200 mL/hr over 60 Minutes Intravenous To ShortStay Surgical 04/12/16 1311 04/13/16 1125      Assessment/Plan: s/p Procedure(s): INCISIONAL HERNIA REPAIR (N/A) INSERTION OF MESH (N/A)  Try suppos Continue current care  LOS: 6 days    Miroslav Gin A 04/19/2016

## 2016-04-20 ENCOUNTER — Inpatient Hospital Stay (HOSPITAL_COMMUNITY): Payer: 59

## 2016-04-20 MED ORDER — BISACODYL 10 MG RE SUPP
10.0000 mg | Freq: Once | RECTAL | Status: AC
  Administered 2016-04-20: 10 mg via RECTAL
  Filled 2016-04-20: qty 1

## 2016-04-20 NOTE — Progress Notes (Signed)
7 Days Post-Op  Subjective: Still no bm Tolerating po Pain control improving Still with high output from drain  Objective: Vital signs in last 24 hours: Temp:  [98.1 F (36.7 C)-98.6 F (37 C)] 98.1 F (36.7 C) (08/14 0605) Pulse Rate:  [61-73] 61 (08/14 0605) Resp:  [16-18] 18 (08/14 0605) BP: (104-127)/(66-82) 120/68 (08/14 0605) SpO2:  [99 %-100 %] 100 % (08/14 0605) Last BM Date: 04/19/16  Intake/Output from previous day: 08/13 0701 - 08/14 0700 In: 980 [P.O.:480; I.V.:500] Out: 540 [Drains:540] Intake/Output this shift: Total I/O In: 500 [I.V.:500] Out: 290 [Drains:290]  Exam: Comfortable Abdomen soft, obese, mildly tender Incision healing well Drain serous  Lab Results:  No results for input(s): WBC, HGB, HCT, PLT in the last 72 hours. BMET No results for input(s): NA, K, CL, CO2, GLUCOSE, BUN, CREATININE, CALCIUM in the last 72 hours. PT/INR No results for input(s): LABPROT, INR in the last 72 hours. ABG No results for input(s): PHART, HCO3 in the last 72 hours.  Invalid input(s): PCO2, PO2  Studies/Results: No results found.  Anti-infectives: Anti-infectives    Start     Dose/Rate Route Frequency Ordered Stop   04/13/16 0945  ciprofloxacin (CIPRO) IVPB 400 mg     400 mg 200 mL/hr over 60 Minutes Intravenous To ShortStay Surgical 04/12/16 1311 04/13/16 1125      Assessment/Plan: s/p Procedure(s): INCISIONAL HERNIA REPAIR (N/[name]) INSERTION OF MESH (N/[name])  Continue to ambulate Advance diet Hopefully home in next 1 to 2 days depending on po intake and bm, comfort  LOS: 7 days    [name] 04/20/2016

## 2016-04-21 NOTE — Progress Notes (Signed)
8 Days Post-Op  Subjective: Continues to slowly improve Still no bm but passing flatus  Objective: Vital signs in last 24 hours: Temp:  [98 F (36.7 C)-98.4 F (36.9 C)] 98.4 F (36.9 C) (08/15 0342) Pulse Rate:  [60-70] 60 (08/15 0342) Resp:  [18-19] 18 (08/15 0342) BP: (104-131)/(70-82) 104/70 (08/15 0342) SpO2:  [96 %-98 %] 98 % (08/15 0342) Last BM Date: 04/19/16  Intake/Output from previous day: 08/14 0701 - 08/15 0700 In: 1663.3 [P.O.:840; I.V.:823.3] Out: 395 [Drains:395] Intake/Output this shift: No intake/output data recorded.  Exam: Incision clean Abdomen soft Drain serous  Lab Results:  No results for input(s): WBC, HGB, HCT, PLT in the last 72 hours. BMET No results for input(s): NA, K, CL, CO2, GLUCOSE, BUN, CREATININE, CALCIUM in the last 72 hours. PT/INR No results for input(s): LABPROT, INR in the last 72 hours. ABG No results for input(s): PHART, HCO3 in the last 72 hours.  Invalid input(s): PCO2, PO2  Studies/Results: Dg Abd Portable 1v  Result Date: 04/20/2016 CLINICAL DATA:  Abdominal distention.  Nausea for the past 3 days. EXAM: PORTABLE ABDOMEN - 1 VIEW COMPARISON:  None. FINDINGS: Normal bowel gas pattern. Skin clips overlying the lower abdomen pelvis. Mild scoliosis. IMPRESSION: No acute abnormality. Electronically Signed   By: Claudie Revering M.D.   On: 04/20/2016 14:09    Anti-infectives: Anti-infectives    Start     Dose/Rate Route Frequency Ordered Stop   04/13/16 0945  ciprofloxacin (CIPRO) IVPB 400 mg     400 mg 200 mL/hr over 60 Minutes Intravenous To ShortStay Surgical 04/12/16 1311 04/13/16 1125      Assessment/Plan: s/p Procedure(s): INCISIONAL HERNIA REPAIR (N/A) INSERTION OF MESH (N/A)  Continue to ambulate Awaiting BM Plan home tomorrow  LOS: 8 days    Sair Faulcon A 04/21/2016

## 2016-04-22 LAB — CBC
HCT: 32.7 % — ABNORMAL LOW (ref 36.0–46.0)
HEMOGLOBIN: 10.7 g/dL — AB (ref 12.0–15.0)
MCH: 31.7 pg (ref 26.0–34.0)
MCHC: 32.7 g/dL (ref 30.0–36.0)
MCV: 96.7 fL (ref 78.0–100.0)
Platelets: 435 10*3/uL — ABNORMAL HIGH (ref 150–400)
RBC: 3.38 MIL/uL — ABNORMAL LOW (ref 3.87–5.11)
RDW: 12.4 % (ref 11.5–15.5)
WBC: 7.8 10*3/uL (ref 4.0–10.5)

## 2016-04-22 MED ORDER — PROMETHAZINE HCL 12.5 MG PO TABS: 12.5000 mg | ORAL_TABLET | Freq: Four times a day (QID) | ORAL | 1 refills | Status: DC | PRN

## 2016-04-22 MED ORDER — OXYCODONE-ACETAMINOPHEN 5-325 MG PO TABS: 1.0000 | ORAL_TABLET | ORAL | 0 refills | Status: DC | PRN

## 2016-04-22 NOTE — Progress Notes (Signed)
9 Days Post-Op  Subjective: Feels well Gets a little nausea with pain meds  Objective: Vital signs in last 24 hours: Temp:  [98.6 F (37 C)-99 F (37.2 C)] 98.6 F (37 C) (08/16 0630) Pulse Rate:  [65-76] 76 (08/16 0630) Resp:  [16-18] 18 (08/16 0630) BP: (107-153)/(67-82) 117/77 (08/16 0630) SpO2:  [97 %-99 %] 97 % (08/16 0630) Last BM Date: 04/19/16  Intake/Output from previous day: 08/15 0701 - 08/16 0700 In: 2216.7 [P.O.:600; I.V.:1616.7] Out: 220 [Drains:220] Intake/Output this shift: No intake/output data recorded.  Looks great Abdomen soft, incision clean Drain serosang  Lab Results:   Recent Labs  04/22/16 0521  WBC 7.8  HGB 10.7*  HCT 32.7*  PLT 435*   BMET No results for input(s): NA, K, CL, CO2, GLUCOSE, BUN, CREATININE, CALCIUM in the last 72 hours. PT/INR No results for input(s): LABPROT, INR in the last 72 hours. ABG No results for input(s): PHART, HCO3 in the last 72 hours.  Invalid input(s): PCO2, PO2  Studies/Results: Dg Abd Portable 1v  Result Date: 04/20/2016 CLINICAL DATA:  Abdominal distention.  Nausea for the past 3 days. EXAM: PORTABLE ABDOMEN - 1 VIEW COMPARISON:  None. FINDINGS: Normal bowel gas pattern. Skin clips overlying the lower abdomen pelvis. Mild scoliosis. IMPRESSION: No acute abnormality. Electronically Signed   By: Claudie Revering M.D.   On: 04/20/2016 14:09    Anti-infectives: Anti-infectives    Start     Dose/Rate Route Frequency Ordered Stop   04/13/16 0945  ciprofloxacin (CIPRO) IVPB 400 mg     400 mg 200 mL/hr over 60 Minutes Intravenous To ShortStay Surgical 04/12/16 1311 04/13/16 1125      Assessment/Plan: s/p Procedure(s): INCISIONAL HERNIA REPAIR (N/A) INSERTION OF MESH (N/A)  Discharge home with drain Patient requests walker and bedside toilet  LOS: 9 days    Roniyah Llorens A 04/22/2016

## 2016-04-22 NOTE — Progress Notes (Signed)
AVS given to patient. Home supplies ordered, waiting on delivery to room. Midline removed by IV Team. Understanding verbalized. Drain education and supplies given.

## 2016-04-22 NOTE — Discharge Summary (Signed)
Physician Discharge Summary  Patient ID: [name] MRN: [id number] DOB/AGE: [birth year] 35 y.o.  Admit date: 04/13/2016 Discharge date: 04/22/2016  Admission Diagnoses:  Discharge Diagnoses:  Active Problems:   Incisional hernia obesity  Discharged Condition: good  Hospital Course: admitted post op after complex incisional hernia repair.  Significant post op pain.  With pain and obesity, patient used a walker and bedside toilet.  Slowly improved throughout stay.  Advanced diet and activity.  Discharged home with good pain control overall improvement  Consults: None  Significant Diagnostic Studies:   Treatments: surgery: incisional hernia repair with mesh (TAR)  Discharge Exam: Blood pressure 117/77, pulse 76, temperature 98.6 F (37 C), temperature source Oral, resp. rate 18, height 5\' 1"  (1.549 m), weight 98 kg (216 lb), SpO2 97 %. General appearance: alert, cooperative and no distress Resp: clear to auscultation bilaterally Cardio: regular rate and rhythm, S1, S2 normal, no murmur, click, rub or gallop Incision/Wound:incision healing well, drain serous, abdomen non tender  Disposition: 01-Home or Self Care  Discharge Instructions    Elevated toilet seat    Complete by:  As directed   Walker standard    Complete by:  As directed       Medication List    STOP taking these medications   ondansetron 4 MG disintegrating tablet Commonly known as:  ZOFRAN ODT   traMADol 50 MG tablet Commonly known as:  ULTRAM     TAKE these medications   cyclobenzaprine 10 MG tablet Commonly known as:  FLEXERIL Take 1 tablet (10 mg total) by mouth 2 (two) times daily as needed for muscle spasms.   diphenhydrAMINE 25 MG tablet Commonly known as:  BENADRYL Take 25-50 mg by mouth every 6 (six) hours as needed. itching   levothyroxine 112 MCG tablet Commonly known as:  SYNTHROID, LEVOTHROID Take 112 mcg by mouth daily.   lisinopril 5 MG tablet Commonly known as:   PRINIVIL,ZESTRIL Take 5 mg by mouth daily.   metoprolol 50 MG tablet Commonly known as:  LOPRESSOR Take 50 mg by mouth 2 (two) times daily.   multivitamin tablet Take 1 tablet by mouth daily.   oxyCODONE-acetaminophen 5-325 MG tablet Commonly known as:  PERCOCET/ROXICET Take 1-2 tablets by mouth every 4 (four) hours as needed for moderate pain.   promethazine 12.5 MG tablet Commonly known as:  PHENERGAN Take 1 tablet (12.5 mg total) by mouth every 6 (six) hours as needed for nausea or vomiting.   XARELTO 20 MG Tabs tablet Generic drug:  rivaroxaban Take 20 mg by mouth every evening.        SignedCoralie Keens A 04/22/2016, 7:31 AM

## 2016-05-03 ENCOUNTER — Emergency Department (HOSPITAL_COMMUNITY): Payer: 59

## 2016-05-03 ENCOUNTER — Encounter (HOSPITAL_COMMUNITY): Payer: Self-pay

## 2016-05-03 ENCOUNTER — Inpatient Hospital Stay (HOSPITAL_COMMUNITY)
Admission: EM | Admit: 2016-05-03 | Discharge: 2016-05-10 | DRG: 863 | Disposition: A | Discharge status: Home / Self Care | Payer: 59 | Attending: Surgery | Admitting: Surgery

## 2016-05-03 DIAGNOSIS — L7634 Postprocedural seroma of skin and subcutaneous tissue following other procedure: Secondary | ICD-10-CM | POA: Diagnosis present

## 2016-05-03 DIAGNOSIS — E0500 Thyrotoxicosis with diffuse goiter without thyrotoxic crisis or storm: Secondary | ICD-10-CM | POA: Diagnosis present

## 2016-05-03 DIAGNOSIS — T8140XA Infection following a procedure, unspecified, initial encounter: Secondary | ICD-10-CM | POA: Diagnosis present

## 2016-05-03 DIAGNOSIS — I10 Essential (primary) hypertension: Secondary | ICD-10-CM | POA: Diagnosis present

## 2016-05-03 DIAGNOSIS — G8918 Other acute postprocedural pain: Secondary | ICD-10-CM | POA: Diagnosis present

## 2016-05-03 DIAGNOSIS — R509 Fever, unspecified: Secondary | ICD-10-CM | POA: Diagnosis present

## 2016-05-03 DIAGNOSIS — E039 Hypothyroidism, unspecified: Secondary | ICD-10-CM | POA: Diagnosis present

## 2016-05-03 DIAGNOSIS — R5082 Postprocedural fever: Secondary | ICD-10-CM | POA: Diagnosis present

## 2016-05-03 DIAGNOSIS — T888XXA Other specified complications of surgical and medical care, not elsewhere classified, initial encounter: Secondary | ICD-10-CM

## 2016-05-03 DIAGNOSIS — T814XXA Infection following a procedure, initial encounter: Principal | ICD-10-CM | POA: Diagnosis present

## 2016-05-03 HISTORY — DX: Ventral hernia without obstruction or gangrene: K43.9

## 2016-05-03 LAB — COMPREHENSIVE METABOLIC PANEL
ALK PHOS: 64 U/L (ref 38–126)
ALT: 13 U/L — AB (ref 14–54)
AST: 18 U/L (ref 15–41)
Albumin: 3.4 g/dL — ABNORMAL LOW (ref 3.5–5.0)
Anion gap: 7 (ref 5–15)
BUN: 6 mg/dL (ref 6–20)
CALCIUM: 8.5 mg/dL — AB (ref 8.9–10.3)
CO2: 20 mmol/L — AB (ref 22–32)
CREATININE: 0.84 mg/dL (ref 0.44–1.00)
Chloride: 102 mmol/L (ref 101–111)
GFR calc non Af Amer: >60 mL/min (ref >60)
Glucose, Bld: 83 mg/dL (ref 65–99)
Potassium: 3.5 mmol/L (ref 3.5–5.1)
SODIUM: 129 mmol/L — AB (ref 135–145)
Total Bilirubin: 1.6 mg/dL — ABNORMAL HIGH (ref 0.3–1.2)
Total Protein: 6.6 g/dL (ref 6.5–8.1)

## 2016-05-03 LAB — I-STAT BETA HCG BLOOD, ED (MC, WL, AP ONLY): I-stat hCG, quantitative: <5 m[IU]/mL (ref <5)

## 2016-05-03 LAB — CBC WITH DIFFERENTIAL/PLATELET
Basophils Absolute: 0.1 10*3/uL (ref 0.0–0.1)
Basophils Relative: 1 %
Eosinophils Absolute: 0.4 10*3/uL (ref 0.0–0.7)
Eosinophils Relative: 4 %
HEMATOCRIT: 38.3 % (ref 36.0–46.0)
HEMOGLOBIN: 12.7 g/dL (ref 12.0–15.0)
LYMPHS ABS: 1.5 10*3/uL (ref 0.7–4.0)
LYMPHS PCT: 14 %
MCH: 31.8 pg (ref 26.0–34.0)
MCHC: 33.2 g/dL (ref 30.0–36.0)
MCV: 95.8 fL (ref 78.0–100.0)
MONO ABS: 0.7 10*3/uL (ref 0.1–1.0)
MONOS PCT: 7 %
NEUTROS ABS: 8 10*3/uL — AB (ref 1.7–7.7)
Neutrophils Relative %: 74 %
Platelets: 542 10*3/uL — ABNORMAL HIGH (ref 150–400)
RBC: 4 MIL/uL (ref 3.87–5.11)
RDW: 12 % (ref 11.5–15.5)
WBC: 10.7 10*3/uL — ABNORMAL HIGH (ref 4.0–10.5)

## 2016-05-03 LAB — URINALYSIS, ROUTINE W REFLEX MICROSCOPIC
BILIRUBIN URINE: NEGATIVE
GLUCOSE, UA: NEGATIVE mg/dL
Hgb urine dipstick: NEGATIVE
KETONES UR: NEGATIVE mg/dL
Leukocytes, UA: NEGATIVE
Nitrite: NEGATIVE
PH: 7 (ref 5.0–8.0)
Protein, ur: NEGATIVE mg/dL
SPECIFIC GRAVITY, URINE: 1.025 (ref 1.005–1.030)

## 2016-05-03 LAB — I-STAT CG4 LACTIC ACID, ED
Lactic Acid, Venous: 1.39 mmol/L (ref 0.5–1.9)
Lactic Acid, Venous: 0.4 mmol/L — ABNORMAL LOW (ref 0.5–1.9)

## 2016-05-03 MED ORDER — HYDROMORPHONE HCL 1 MG/ML IJ SOLN
0.5000 mg | INTRAMUSCULAR | Status: DC | PRN
  Administered 2016-05-04 – 2016-05-10 (×21): 1 mg via INTRAVENOUS
  Filled 2016-05-03 (×22): qty 1

## 2016-05-03 MED ORDER — PROMETHAZINE HCL 25 MG PO TABS
12.5000 mg | ORAL_TABLET | Freq: Four times a day (QID) | ORAL | Status: DC | PRN
  Administered 2016-05-04 – 2016-05-07 (×4): 12.5 mg via ORAL
  Filled 2016-05-03 (×4): qty 1

## 2016-05-03 MED ORDER — SODIUM CHLORIDE 0.9 % IV BOLUS (SEPSIS)
1000.0000 mL | Freq: Once | INTRAVENOUS | Status: AC
  Administered 2016-05-03: 1000 mL via INTRAVENOUS

## 2016-05-03 MED ORDER — KCL IN DEXTROSE-NACL 20-5-0.45 MEQ/L-%-% IV SOLN
INTRAVENOUS | Status: DC
  Administered 2016-05-03 – 2016-05-09 (×9): via INTRAVENOUS
  Filled 2016-05-03 (×10): qty 1000

## 2016-05-03 MED ORDER — DIPHENHYDRAMINE HCL 25 MG PO CAPS: 25.0000 mg | ORAL_CAPSULE | Freq: Four times a day (QID) | ORAL | Status: DC | PRN

## 2016-05-03 MED ORDER — METOPROLOL TARTRATE 50 MG PO TABS
50.0000 mg | ORAL_TABLET | Freq: Two times a day (BID) | ORAL | Status: DC
  Administered 2016-05-04 – 2016-05-10 (×11): 50 mg via ORAL
  Filled 2016-05-03 (×13): qty 1

## 2016-05-03 MED ORDER — OXYCODONE-ACETAMINOPHEN 5-325 MG PO TABS
1.0000 | ORAL_TABLET | ORAL | Status: DC | PRN
  Administered 2016-05-05 – 2016-05-10 (×14): 2 via ORAL
  Filled 2016-05-03 (×15): qty 2

## 2016-05-03 MED ORDER — ONDANSETRON HCL 4 MG/2ML IJ SOLN
4.0000 mg | Freq: Four times a day (QID) | INTRAMUSCULAR | Status: DC | PRN
  Administered 2016-05-04 – 2016-05-07 (×7): 4 mg via INTRAVENOUS
  Filled 2016-05-03 (×7): qty 2

## 2016-05-03 MED ORDER — IOPAMIDOL (ISOVUE-300) INJECTION 61%
75.0000 mL | Freq: Once | INTRAVENOUS | Status: AC | PRN
  Administered 2016-05-03: 75 mL via INTRAVENOUS

## 2016-05-03 MED ORDER — ACETAMINOPHEN 500 MG PO TABS
1000.0000 mg | ORAL_TABLET | Freq: Once | ORAL | Status: AC
  Administered 2016-05-03: 1000 mg via ORAL
  Filled 2016-05-03: qty 2

## 2016-05-03 MED ORDER — LEVOTHYROXINE SODIUM 112 MCG PO TABS
112.0000 ug | ORAL_TABLET | Freq: Every day | ORAL | Status: DC
  Administered 2016-05-04 – 2016-05-10 (×7): 112 ug via ORAL
  Filled 2016-05-03 (×7): qty 1

## 2016-05-03 MED ORDER — METRONIDAZOLE IN NACL 5-0.79 MG/ML-% IV SOLN
500.0000 mg | Freq: Once | INTRAVENOUS | Status: AC
  Administered 2016-05-03: 500 mg via INTRAVENOUS
  Filled 2016-05-03: qty 100

## 2016-05-03 MED ORDER — HYDROMORPHONE HCL 1 MG/ML IJ SOLN
1.0000 mg | Freq: Once | INTRAMUSCULAR | Status: AC
  Administered 2016-05-03: 1 mg via INTRAVENOUS
  Filled 2016-05-03: qty 1

## 2016-05-03 MED ORDER — LISINOPRIL 5 MG PO TABS
5.0000 mg | ORAL_TABLET | Freq: Every day | ORAL | Status: DC
  Administered 2016-05-04 – 2016-05-10 (×7): 5 mg via ORAL
  Filled 2016-05-03 (×7): qty 1

## 2016-05-03 MED ORDER — ONDANSETRON 4 MG PO TBDP
4.0000 mg | ORAL_TABLET | Freq: Four times a day (QID) | ORAL | Status: DC | PRN
  Administered 2016-05-06: 4 mg via ORAL

## 2016-05-03 MED ORDER — DIATRIZOATE MEGLUMINE & SODIUM 66-10 % PO SOLN
ORAL | Status: AC
  Administered 2016-05-03: 20:00:00
  Filled 2016-05-03: qty 30

## 2016-05-03 MED ORDER — METRONIDAZOLE IN NACL 5-0.79 MG/ML-% IV SOLN
500.0000 mg | Freq: Three times a day (TID) | INTRAVENOUS | Status: DC
  Administered 2016-05-04 – 2016-05-08 (×13): 500 mg via INTRAVENOUS
  Filled 2016-05-03 (×15): qty 100

## 2016-05-03 MED ORDER — CIPROFLOXACIN IN D5W 400 MG/200ML IV SOLN
400.0000 mg | Freq: Once | INTRAVENOUS | Status: AC
  Administered 2016-05-03: 400 mg via INTRAVENOUS
  Filled 2016-05-03: qty 200

## 2016-05-03 MED ORDER — CIPROFLOXACIN IN D5W 400 MG/200ML IV SOLN
400.0000 mg | Freq: Two times a day (BID) | INTRAVENOUS | Status: DC
  Administered 2016-05-04 – 2016-05-07 (×8): 400 mg via INTRAVENOUS
  Filled 2016-05-03 (×10): qty 200

## 2016-05-03 NOTE — ED Notes (Signed)
Resident at bedside.  

## 2016-05-03 NOTE — ED Notes (Signed)
Dr. Thompson at bedside. 

## 2016-05-03 NOTE — ED Provider Notes (Addendum)
[facility] DEPT Provider Note   CSN: [id number] Arrival date & time: 05/03/16  1713     History   Chief Complaint Chief Complaint  Patient presents with  . Post-op Problem    HPI [name] is a 35 y.o. female.  Patient is a 35 year old female past medical history of Graves' disease prior PE and hypertension as well as recent abdominal surgery. Patient underwent abdominal hernia repair on the seventh of this month. 2 days ago patient had staples removed as well as drain. Patient states she awoke at approximately 1 AM this morning with significant abdominal pain just superior to the site of incision. Patient also developed fevers and chills. Patient endorses some nausea but denies vomiting. Patient denies diarrhea chest pain but endorses some shortness of breath as well as lightheadedness.      Past Medical History:  Diagnosis Date  . Complication of anesthesia    "difficulty waking up and will fight when woken up"  . Gallstones   . Graves disease   . Hernia, ventral   . History of blood clots   . History of pulmonary embolus (PE)   . Hypertension   . Hypothyroid   . Shortness of breath dyspnea     Patient Active Problem List   Diagnosis Date Noted  . Post op infection 05/03/2016  . Incisional hernia 04/13/2016    Past Surgical History:  Procedure Laterality Date  . BREAST SURGERY     cyst removed from right  . CESAREAN SECTION    . INCISIONAL HERNIA REPAIR  04/13/2016  . INCISIONAL HERNIA REPAIR N/A 04/13/2016   Procedure: INCISIONAL HERNIA REPAIR;  Surgeon: Coralie Keens, MD;  Location: [facility];  Service: General;  Laterality: N/A;  . INSERTION OF MESH N/A 04/13/2016   Procedure: INSERTION OF MESH;  Surgeon: Coralie Keens, MD;  Location: [facility];  Service: General;  Laterality: N/A;  . PILONIDAL CYST / SINUS EXCISION    . removal of sweat gland Bilateral    arms    OB History    No data available       Home Medications    Prior to Admission  medications   Medication Sig Start Date End Date Taking? Authorizing Provider  diphenhydrAMINE (BENADRYL) 25 MG tablet Take 25-50 mg by mouth every 6 (six) hours as needed. itching   Yes Historical Provider, MD  levothyroxine (SYNTHROID, LEVOTHROID) 112 MCG tablet Take 112 mcg by mouth daily. 01/21/16 01/20/17 Yes Historical Provider, MD  lisinopril (PRINIVIL,ZESTRIL) 5 MG tablet Take 5 mg by mouth daily. 11/11/15 11/10/16 Yes Historical Provider, MD  metoprolol (LOPRESSOR) 50 MG tablet Take 50 mg by mouth 2 (two) times daily. 09/12/15  Yes Historical Provider, MD  oxyCODONE-acetaminophen (PERCOCET/ROXICET) 5-325 MG tablet Take 1-2 tablets by mouth every 4 (four) hours as needed for moderate pain. 04/22/16  Yes Coralie Keens, MD  promethazine (PHENERGAN) 12.5 MG tablet Take 1 tablet (12.5 mg total) by mouth every 6 (six) hours as needed for nausea or vomiting. 04/22/16  Yes Coralie Keens, MD  Rivaroxaban (XARELTO) 20 MG TABS Take 20 mg by mouth every evening.    Yes Historical Provider, MD  cyclobenzaprine (FLEXERIL) 10 MG tablet Take 1 tablet (10 mg total) by mouth 2 (two) times daily as needed for muscle spasms. Patient not taking: Reported on 03/30/2016 08/24/15   Gareth Morgan, MD    Family History Family History  Problem Relation Age of Onset  . Hypertension Other   . Hyperlipidemia Other  Social History Social History  Substance Use Topics  . Smoking status: Never Smoker  . Smokeless tobacco: Never Used  . Alcohol use Yes     Comment: occasionally     Allergies   Penicillins   Review of Systems Review of Systems  Constitutional: Positive for fatigue. Negative for chills and fever.  Respiratory: Positive for shortness of breath.   Gastrointestinal: Positive for abdominal distention and abdominal pain. Negative for blood in stool.  Neurological: Positive for light-headedness.  All other systems reviewed and are negative.    Physical Exam Updated Vital Signs BP 121/70  (BP Location: Right Arm)   Pulse 81   Temp 98.7 F (37.1 C) (Oral)   Resp 20   Ht 5\' 1"  (1.549 m)   Wt 98.2 kg   LMP 04/13/2016 Comment: reviewed with pt. BEFORE imaging  SpO2 100%   BMI 40.91 kg/m   Physical Exam  Constitutional: She appears well-developed and well-nourished. No distress.  HENT:  Head: Normocephalic and atraumatic.  Eyes: Conjunctivae are normal.  Neck: Neck supple.  Cardiovascular: Normal rate and regular rhythm.   No murmur heard. Pulmonary/Chest: Effort normal and breath sounds normal. No respiratory distress.  Abdominal: Bowel sounds are normal. She exhibits distension and mass. There is no tenderness. There is guarding.  Musculoskeletal: She exhibits no edema.  Neurological: She is alert.  Skin: Skin is warm and dry.  Psychiatric: She has a normal mood and affect.  Nursing note and vitals reviewed.    ED Treatments / Results  Labs (all labs ordered are listed, but only abnormal results are displayed) Labs Reviewed  URINE CULTURE - Abnormal; Notable for the following:       Result Value   Culture MULTIPLE SPECIES PRESENT, SUGGEST RECOLLECTION (*)    All other components within normal limits  COMPREHENSIVE METABOLIC PANEL - Abnormal; Notable for the following:    Sodium 129 (*)    CO2 20 (*)    Calcium 8.5 (*)    Albumin 3.4 (*)    ALT 13 (*)    Total Bilirubin 1.6 (*)    All other components within normal limits  CBC WITH DIFFERENTIAL/PLATELET - Abnormal; Notable for the following:    WBC 10.7 (*)    Platelets 542 (*)    Neutro Abs 8.0 (*)    All other components within normal limits  URINALYSIS, ROUTINE W REFLEX MICROSCOPIC (NOT AT [facility]) - Abnormal; Notable for the following:    APPearance CLOUDY (*)    All other components within normal limits  BASIC METABOLIC PANEL - Abnormal; Notable for the following:    Sodium 133 (*)    Potassium 3.4 (*)    Glucose, Bld 105 (*)    BUN <5 (*)    Calcium 7.6 (*)    All other components within  normal limits  CBC - Abnormal; Notable for the following:    RBC 3.33 (*)    Hemoglobin 10.3 (*)    HCT 32.0 (*)    Platelets 453 (*)    All other components within normal limits  CBC - Abnormal; Notable for the following:    WBC 10.8 (*)    RBC 3.25 (*)    Hemoglobin 10.1 (*)    HCT 31.0 (*)    All other components within normal limits  PROTIME-INR - Abnormal; Notable for the following:    Prothrombin Time 17.1 (*)    All other components within normal limits  I-STAT CG4 LACTIC ACID, ED -  Abnormal; Notable for the following:    Lactic Acid, Venous 0.40 (*)    All other components within normal limits  CULTURE, BLOOD (ROUTINE X 2)  CULTURE, BLOOD (ROUTINE X 2)  CULTURE, BODY FLUID-BOTTLE  GRAM STAIN  I-STAT BETA HCG BLOOD, ED ([facility], [facility], [facility] ONLY)  I-STAT CG4 LACTIC ACID, ED    EKG  EKG Interpretation None       Radiology Dg Chest 2 View  Result Date: 05/03/2016 CLINICAL DATA:  Suspected sepsis. Shortness of breath. Recent umbilical hernia repair. EXAM: CHEST  2 VIEW COMPARISON:  CT 08/24/2015.  Portable chest 05/07/2013. FINDINGS: Suboptimal inspiration. The heart size is stable at the upper limits of normal for AP technique. The mediastinal contours are normal. There are linear bibasilar opacities most consistent with atelectasis. No confluent airspace opacity, edema or significant pleural effusion. The bones appear unchanged. IMPRESSION: Subsegmental atelectasis at both lung bases. No consolidation or significant pleural effusion. Electronically Signed   By: Richardean Sale M.D.   On: 05/03/2016 19:24   Ct Abdomen Pelvis W Contrast  Result Date: 05/03/2016 CLINICAL DATA:  Umbilical hernia repair on 04/13/2016. Now experiencing pain in neck region. Nausea and vomiting. EXAM: CT ABDOMEN AND PELVIS WITH CONTRAST TECHNIQUE: Multidetector CT imaging of the abdomen and pelvis was performed using the standard protocol following bolus administration of intravenous contrast. CONTRAST:   48mL ISOVUE-300 IOPAMIDOL (ISOVUE-300) INJECTION 61% COMPARISON:  10/16/2015 FINDINGS: Areas of linear atelectasis demonstrated in both lung bases. There has been interval closure of the ventral abdominal wall hernia since the prior study. There is a loculated fluid collection demonstrated in anterior abdominal wall along the peritoneal surface and possibly including transverse bowel loops. This measures maximally about 3.7 x 22.7 cm in the axial plane and about 20.9 cm along the sagittal plane. This extends from just below the xiphoid process to just above the symphysis pubis. This may represent abscess or a postoperative hematoma. No active contrast extravasation is noted. There is some infiltration or edema in the mesenteric which may also be postoperative or reactive. No small or large bowel dilatation. The liver, spleen, gallbladder, pancreas, adrenal glands, kidneys, abdominal aorta, inferior vena cava, and retroperitoneal lymph nodes are unremarkable. Stomach, small bowel, and colon are not abnormally distended. No free air in the abdomen. Pelvis: The appendix is normal. Uterus and ovaries are not enlarged. No free or loculated pelvic fluid collections. No pelvic lymphadenopathy. Bladder wall is not thickened. No destructive bone lesions. IMPRESSION: Interval closure of ventral abdominal wall hernia since prior study with development of a large loculated fluid collection demonstrated in the anterior abdominal wall along the peritoneal surface and possibly extending to the transverse colon loops. This likely represents postoperative abscess or hematoma. Electronically Signed   By: Lucienne Capers M.D.   On: 05/03/2016 20:24   Dg Chest Port 1 View  Result Date: 05/04/2016 CLINICAL DATA:  Fever EXAM: PORTABLE CHEST 1 VIEW COMPARISON:  May 03, 2016 FINDINGS: There is patchy airspace consolidation in the medial right base. The lungs elsewhere are clear. Heart is upper normal in size with pulmonary  vascularity within normal limits. No adenopathy. No bone lesions. IMPRESSION: Patchy airspace consolidation medial right base. Lungs elsewhere clear. Electronically Signed   By: Lowella Grip III M.D.   On: 05/04/2016 21:38   Ct Image Guided Fluid Drain By Catheter  Result Date: 05/05/2016 INDICATION: History of ventral hernia repair, now admitted with abdominal pain and fever with CT scan of the abdomen pelvis demonstrating  an indeterminate fluid collection within the ventral wall of the abdomen and pelvis. Request made for either CT-guided aspiration or placement of a percutaneous drainage catheter. EXAM: CT IMAGE GUIDED FLUID DRAIN BY CATHETER COMPARISON:  CT abdomen pelvis -05/03/2016 MEDICATIONS: The patient is currently admitted to the hospital and receiving intravenous antibiotics. The antibiotics were administered within an appropriate time frame prior to the initiation of the procedure. ANESTHESIA/SEDATION: Moderate (conscious) sedation was employed during this procedure. A total of Versed 2 mg and Fentanyl 100 mcg was administered intravenously. Moderate Sedation Time: 20 minutes. The patient's level of consciousness and vital signs were monitored continuously by radiology nursing throughout the procedure under my direct supervision. CONTRAST:  None COMPLICATIONS: None immediate. PROCEDURE: Informed written consent was obtained from the patient after a discussion of the risks, benefits and alternatives to treatment. The patient was placed supine on the CT gantry and a pre procedural CT was performed re-demonstrating the known abscess/fluid collection within the ventral aspect of the abdominal/ pelvic wall with dominant caudal component measuring approximately 23.3 x 4.2 cm (image 48, series 2). The procedure was planned. A timeout was performed prior to the initiation of the procedure. The skin overlying the left lateral lower at the was prepped and draped in the usual sterile fashion. The  overlying soft tissues were anesthetized with 1% lidocaine with epinephrine. Appropriate trajectory was planned with the use of a 22 gauge spinal needle. An 18 gauge trocar needle was advanced into the abscess/fluid collection and a short Amplatz super stiff wire was coiled within the collection. Appropriate positioning was confirmed with a limited CT scan. The tract was serially dilated allowing placement of a 10 [place] all-purpose drainage catheter. Appropriate positioning was confirmed with a limited postprocedural CT scan. Approximately 600 Ml of serous fluid was aspirated. The tube was connected to a JP bulb and sutured in place. A dressing was placed. The patient tolerated the procedure well without immediate post procedural complication. IMPRESSION: Successful CT guided placement of a 10 French all purpose drain catheter into the indeterminate fluid collection within the ventral aspect of the abdominal/pelvic wall with aspiration of approximately 600 mL of serous fluid. Samples were sent to the laboratory as requested by the ordering clinical team. The etiology of this fluid collection remains indeterminate with differential considerations including seroma (favored), abscess and superinfected hematoma. Electronically Signed   By: Sandi Mariscal M.D.   On: 05/05/2016 15:08    Procedures Procedures (including critical care time)  Medications Ordered in ED Medications  levothyroxine (SYNTHROID, LEVOTHROID) tablet 112 mcg (112 mcg Oral Given 05/05/16 1023)  lisinopril (PRINIVIL,ZESTRIL) tablet 5 mg (5 mg Oral Given 05/05/16 1023)  oxyCODONE-acetaminophen (PERCOCET/ROXICET) 5-325 MG per tablet 1-2 tablet (2 tablets Oral Given 05/05/16 1832)  promethazine (PHENERGAN) tablet 12.5 mg (12.5 mg Oral Given 05/05/16 1628)  metoprolol (LOPRESSOR) tablet 50 mg (50 mg Oral Given 05/05/16 1023)  diphenhydrAMINE (BENADRYL) capsule 25 mg (not administered)  dextrose 5 % and 0.45 % NaCl with KCl 20 mEq/L infusion (  Intravenous New Bag/Given 05/05/16 1455)  HYDROmorphone (DILAUDID) injection 0.5-1 mg (1 mg Intravenous Given 05/05/16 1618)  ondansetron (ZOFRAN-ODT) disintegrating tablet 4 mg ( Oral See Alternative 05/05/16 1051)    Or  ondansetron (ZOFRAN) injection 4 mg (4 mg Intravenous Given 05/05/16 1051)  ciprofloxacin (CIPRO) IVPB 400 mg (400 mg Intravenous Given 05/05/16 1023)  metroNIDAZOLE (FLAGYL) IVPB 500 mg (500 mg Intravenous Given 05/05/16 1452)  polyethylene glycol (MIRALAX / GLYCOLAX) packet 17 g (17 g Oral  Not Given 05/05/16 1000)  acetaminophen (TYLENOL) tablet 650 mg (650 mg Oral Given 05/04/16 1840)  lidocaine-EPINEPHrine (XYLOCAINE W/EPI) 1 %-1:100000 (with pres) injection (not administered)  midazolam (VERSED) 2 MG/2ML injection (not administered)  fentaNYL (SUBLIMAZE) 100 MCG/2ML injection (not administered)  acetaminophen (TYLENOL) tablet 1,000 mg (1,000 mg Oral Given 05/03/16 1811)  sodium chloride 0.9 % bolus 1,000 mL (0 mLs Intravenous Stopped 05/03/16 1923)  diatrizoate meglumine-sodium (GASTROGRAFIN) 66-10 % solution (  Contrast Given 05/03/16 2013)  sodium chloride 0.9 % bolus 1,000 mL (0 mLs Intravenous Stopped 05/03/16 2126)  ciprofloxacin (CIPRO) IVPB 400 mg (0 mg Intravenous Stopped 05/03/16 2139)  metroNIDAZOLE (FLAGYL) IVPB 500 mg (0 mg Intravenous Stopped 05/03/16 2139)  iopamidol (ISOVUE-300) 61 % injection 75 mL (75 mLs Intravenous Contrast Given 05/03/16 1932)  HYDROmorphone (DILAUDID) injection 1 mg (1 mg Intravenous Given 05/03/16 2026)  bisacodyl (DULCOLAX) suppository 10 mg (10 mg Rectal Given 05/04/16 0834)  sodium phosphate (FLEET) 7-19 GM/118ML enema 1 enema (1 enema Rectal Given 05/04/16 0834)  midazolam (VERSED) injection (2 mg Intravenous Given 05/05/16 1403)  fentaNYL (SUBLIMAZE) injection (50 mcg Intravenous Given 05/05/16 1409)     Initial Impression / Assessment and Plan / ED Course  I have reviewed the triage vital signs and the nursing notes.  Pertinent labs &  imaging results that were available during my care of the patient were reviewed by me and considered in my medical decision making (see chart for details).  Clinical Course    Patient is a 35 year old female past medical history of Graves' disease prior PE and hypertension as well as recent abdominal surgery. Patient underwent abdominal hernia repair on the seventh of this month. 2 days ago patient had staples removed as well as drain. Patient states she awoke at approximately 1 AM this morning with significant abdominal pain just superior to the site of incision. Patient also developed fevers and chills. Patient endorses some nausea but denies vomiting. Patient denies diarrhea chest pain but endorses some shortness of breath as well as lightheadedness.  Physical exam: surgical incision site is clean and dry with no notable erythema or drainage. Former site of drain tubing show some evidence of subcutaneous pus. Patient has diffuse tenderness to palpation most notably just superior to the site of her incision left upper quadrant. Patient has obvious guarding, unable to assess rebound given patient's tenderness.  Given patient's fever and recent history, we'll initiate code sepsis. We'll additionally collect CT of the abdomen to evaluate for possible abdominal wall abscess, or other infectious etiology.   Patient afebrile on arrival vital signs stable.  Patient's laboratory workup largely within normal limits, with the exception of hyponatremia, CO2 of 20, patient with slight leukocytosis.  CT abdomen showed newly developed large loculated fluid collection in the anterior abdominal wall, favored to be postoperative abscess or hematoma.  Chest x-ray showed subsegmental atelectasis of both lung bases but no other acute findings.  I consulted general surgery who agreed to admit the patient to their service.    Final Clinical Impressions(s) / ED Diagnoses   Final diagnoses:  Fever    New  Prescriptions Current Discharge Medication List       Chapman Moss, MD 05/05/16 1905    Elnora Morrison, MD 05/07/16 [id number]    Elnora Morrison, MD 06/26/16 2101

## 2016-05-03 NOTE — ED Notes (Signed)
Patient transported to CT 

## 2016-05-03 NOTE — ED Notes (Signed)
Pt in CT.

## 2016-05-03 NOTE — ED Notes (Signed)
Pt requesting update. Dr. Gwynne Edinger made aware

## 2016-05-03 NOTE — ED Triage Notes (Signed)
Per [facility]: Hernia surgery on 04/13/16, today at 0100 the pt woke up with 10/10 pain at the incision. Pain has since radiated to lower back. Pt is warm to the touch.

## 2016-05-03 NOTE — ED Notes (Signed)
Dr. Steffanie Dunn states that he only wants IV contrast for the pt and not oral contrast. CT made aware.

## 2016-05-03 NOTE — ED Notes (Signed)
Pts daughter states "today she didn't want to get up, she said she felt weak and she didn't get up to walk around. That's not normal for her, shes' usually up. When I got home I noticed she looked sick. Shes really sleepy right now, and that not normal for her. She hasn't taken anything to make her sleepy like this". The pt also denies taking any pain medication since 0800 this AM.

## 2016-05-03 NOTE — H&P (Signed)
[name] is an 35 y.o. female.   Chief Complaint: Abdominal pain and subjective fever HPI: [name] is S/P complex incisional hernia repair with TAR and managed by Dr. Coralie Keens on 04/13/16. She was hospitalized until 04/22/16. Very early this morning she developed increasing abdominal pain along her incisional area. She had decreased appetite. She came to the emergency room for further evaluation. White blood cell count is slightly elevated. CT scan of the abdomen and pelvis was performed demonstrating a large loculated fluid collection just under the anterior abdominal wall consistent with hematoma versus abscess. I was asked to see her for admission. Last bowel movement was a couple days ago. No nausea. She takes Xarelto for a HX of PE.  Past Medical History:  Diagnosis Date  . Complication of anesthesia    "difficulty waking up and will fight when woken up"  . Gallstones   . Graves disease   . Hernia, ventral   . History of blood clots   . History of pulmonary embolus (PE)   . Hypertension   . Hypothyroid   . Shortness of breath dyspnea     Past Surgical History:  Procedure Laterality Date  . BREAST SURGERY     cyst removed from right  . CESAREAN SECTION    . INCISIONAL HERNIA REPAIR  04/13/2016  . INCISIONAL HERNIA REPAIR N/A 04/13/2016   Procedure: INCISIONAL HERNIA REPAIR;  Surgeon: Coralie Keens, MD;  Location: [place];  Service: General;  Laterality: N/A;  . INSERTION OF MESH N/A 04/13/2016   Procedure: INSERTION OF MESH;  Surgeon: Coralie Keens, MD;  Location: [place];  Service: General;  Laterality: N/A;  . PILONIDAL CYST / SINUS EXCISION    . removal of sweat gland Bilateral    arms    Family History  Problem Relation Age of Onset  . Hypertension Other   . Hyperlipidemia Other    Social History:  reports that she has never smoked. She has never used smokeless tobacco. She reports that she drinks alcohol. She reports that she does not use drugs.  Allergies:   Allergies  Allergen Reactions  . Penicillins Hives    Has patient had a PCN reaction causing immediate rash, facial/tongue/throat swelling, SOB or lightheadedness with hypotension: Yes Has patient had a PCN reaction causing severe rash involving mucus membranes or skin necrosis: Yes Has patient had a PCN reaction that required hospitalization Yes Has patient had a PCN reaction occurring within the last 10 years: No If all of the above answers are "NO", then may proceed with Cephalosporin use.      (Not in a hospital admission)  Results for orders placed or performed during the hospital encounter of 05/03/16 (from the past 48 hour(s))  Comprehensive metabolic panel     Status: Abnormal   Collection Time: 05/03/16  5:45 PM  Result Value Ref Range   Sodium 129 (L) 135 - 145 mmol/L   Potassium 3.5 3.5 - 5.1 mmol/L   Chloride 102 101 - 111 mmol/L   CO2 20 (L) 22 - 32 mmol/L   Glucose, Bld 83 65 - 99 mg/dL   BUN 6 6 - 20 mg/dL   Creatinine, Ser 0.84 0.44 - 1.00 mg/dL   Calcium 8.5 (L) 8.9 - 10.3 mg/dL   Total Protein 6.6 6.5 - 8.1 g/dL   Albumin 3.4 (L) 3.5 - 5.0 g/dL   AST 18 15 - 41 U/L   ALT 13 (L) 14 - 54 U/L   Alkaline Phosphatase  64 38 - 126 U/L   Total Bilirubin 1.6 (H) 0.3 - 1.2 mg/dL   GFR calc non Af Amer >60 >60 mL/min   GFR calc Af Amer >60 >60 mL/min    Comment: (NOTE) The eGFR has been calculated using the CKD EPI equation. This calculation has not been validated in all clinical situations. eGFR's persistently <60 mL/min signify possible Chronic Kidney Disease.    Anion gap 7 5 - 15  CBC with Differential     Status: Abnormal   Collection Time: 05/03/16  5:45 PM  Result Value Ref Range   WBC 10.7 (H) 4.0 - 10.5 K/uL   RBC 4.00 3.87 - 5.11 MIL/uL   Hemoglobin 12.7 12.0 - 15.0 g/dL   HCT 38.3 36.0 - 46.0 %   MCV 95.8 78.0 - 100.0 fL   MCH 31.8 26.0 - 34.0 pg   MCHC 33.2 30.0 - 36.0 g/dL   RDW 12.0 11.5 - 15.5 %   Platelets 542 (H) 150 - 400 K/uL    Neutrophils Relative % 74 %   Neutro Abs 8.0 (H) 1.7 - 7.7 K/uL   Lymphocytes Relative 14 %   Lymphs Abs 1.5 0.7 - 4.0 K/uL   Monocytes Relative 7 %   Monocytes Absolute 0.7 0.1 - 1.0 K/uL   Eosinophils Relative 4 %   Eosinophils Absolute 0.4 0.0 - 0.7 K/uL   Basophils Relative 1 %   Basophils Absolute 0.1 0.0 - 0.1 K/uL  I-Stat beta hCG blood, ED     Status: None   Collection Time: 05/03/16  6:04 PM  Result Value Ref Range   I-stat hCG, quantitative <5.0 <5 mIU/mL   Comment 3            Comment:   GEST. AGE      CONC.  (mIU/mL)   <=1 WEEK        5 - 50     2 WEEKS       50 - 500     3 WEEKS       100 - 10,000     4 WEEKS     1,000 - 30,000        FEMALE AND NON-PREGNANT FEMALE:     LESS THAN 5 mIU/mL   I-Stat CG4 Lactic Acid, ED     Status: None   Collection Time: 05/03/16  6:07 PM  Result Value Ref Range   Lactic Acid, Venous 1.39 0.5 - 1.9 mmol/L  Urinalysis, Routine w reflex microscopic     Status: Abnormal   Collection Time: 05/03/16  7:50 PM  Result Value Ref Range   Color, Urine YELLOW YELLOW   APPearance CLOUDY (A) CLEAR   Specific Gravity, Urine 1.025 1.005 - 1.030   pH 7.0 5.0 - 8.0   Glucose, UA NEGATIVE NEGATIVE mg/dL   Hgb urine dipstick NEGATIVE NEGATIVE   Bilirubin Urine NEGATIVE NEGATIVE   Ketones, ur NEGATIVE NEGATIVE mg/dL   Protein, ur NEGATIVE NEGATIVE mg/dL   Nitrite NEGATIVE NEGATIVE   Leukocytes, UA NEGATIVE NEGATIVE    Comment: MICROSCOPIC NOT DONE ON URINES WITH NEGATIVE PROTEIN, BLOOD, LEUKOCYTES, NITRITE, OR GLUCOSE <1000 mg/dL.   Dg Chest 2 View  Result Date: 05/03/2016 CLINICAL DATA:  Suspected sepsis. Shortness of breath. Recent umbilical hernia repair. EXAM: CHEST  2 VIEW COMPARISON:  CT 08/24/2015.  Portable chest 05/07/2013. FINDINGS: Suboptimal inspiration. The heart size is stable at the upper limits of normal for AP technique. The mediastinal contours are normal. There  are linear bibasilar opacities most consistent with atelectasis. No  confluent airspace opacity, edema or significant pleural effusion. The bones appear unchanged. IMPRESSION: Subsegmental atelectasis at both lung bases. No consolidation or significant pleural effusion. Electronically Signed   By: Richardean Sale M.D.   On: 05/03/2016 19:24   Ct Abdomen Pelvis W Contrast  Result Date: 05/03/2016 CLINICAL DATA:  Umbilical hernia repair on 04/13/2016. Now experiencing pain in neck region. Nausea and vomiting. EXAM: CT ABDOMEN AND PELVIS WITH CONTRAST TECHNIQUE: Multidetector CT imaging of the abdomen and pelvis was performed using the standard protocol following bolus administration of intravenous contrast. CONTRAST:  39m ISOVUE-300 IOPAMIDOL (ISOVUE-300) INJECTION 61% COMPARISON:  10/16/2015 FINDINGS: Areas of linear atelectasis demonstrated in both lung bases. There has been interval closure of the ventral abdominal wall hernia since the prior study. There is a loculated fluid collection demonstrated in anterior abdominal wall along the peritoneal surface and possibly including transverse bowel loops. This measures maximally about 3.7 x 22.7 cm in the axial plane and about 20.9 cm along the sagittal plane. This extends from just below the xiphoid process to just above the symphysis pubis. This may represent abscess or a postoperative hematoma. No active contrast extravasation is noted. There is some infiltration or edema in the mesenteric which may also be postoperative or reactive. No small or large bowel dilatation. The liver, spleen, gallbladder, pancreas, adrenal glands, kidneys, abdominal aorta, inferior vena cava, and retroperitoneal lymph nodes are unremarkable. Stomach, small bowel, and colon are not abnormally distended. No free air in the abdomen. Pelvis: The appendix is normal. Uterus and ovaries are not enlarged. No free or loculated pelvic fluid collections. No pelvic lymphadenopathy. Bladder wall is not thickened. No destructive bone lesions. IMPRESSION: Interval  closure of ventral abdominal wall hernia since prior study with development of a large loculated fluid collection demonstrated in the anterior abdominal wall along the peritoneal surface and possibly extending to the transverse colon loops. This likely represents postoperative abscess or hematoma. Electronically Signed   By: WLucienne CapersM.D.   On: 05/03/2016 20:24    Review of Systems  Constitutional: Positive for fever.  HENT: Negative.   Eyes: Negative.   Respiratory: Negative.   Cardiovascular: Negative.   Gastrointestinal: Positive for abdominal pain. Negative for diarrhea, nausea and vomiting.  Genitourinary: Negative.   Musculoskeletal: Negative.   Skin: Negative.   Neurological: Negative.   Endo/Heme/Allergies: Bruises/bleeds easily.  Psychiatric/Behavioral: Negative.     Blood pressure 124/79, pulse 90, temperature 98.9 F (37.2 C), temperature source Oral, resp. rate 17, height 5' 1"  (1.549 m), weight 96.2 kg (212 lb), last menstrual period 04/13/2016, SpO2 99 %. Physical Exam  Constitutional: She is oriented to person, place, and time. She appears well-developed and well-nourished. No distress.  HENT:  Head: Normocephalic.  Right Ear: External ear normal.  Left Ear: External ear normal.  Mouth/Throat: Oropharynx is clear and moist.  Eyes: EOM are normal. Pupils are equal, round, and reactive to light.  Neck: Neck supple.  Cardiovascular: Normal rate, regular rhythm and normal heart sounds.   Respiratory: Effort normal and breath sounds normal. No respiratory distress. She has no wheezes. She has no rales.  GI: Soft. She exhibits no distension. There is tenderness. There is no rebound and no guarding.  Incision is healed with one small scab at central portion, no significant erythema to the abdominal wall, mild tenderness under the superior portion of the incision, positive bowel sounds  Musculoskeletal: Normal range of motion.  Neurological: She  is alert and oriented  to person, place, and time. She exhibits normal muscle tone.  Skin: Skin is warm.  Psychiatric: She has a normal mood and affect.     Assessment/Plan Status post complex hernia repair with mesh and TAR now with postoperative fluid collection versus abscess - admit, IV antibiotics, hold Xarelto in case she needs a procedure tomorrow. I will discuss her care with Dr. [name].  Zenovia Jarred, MD 05/03/2016, 9:34 PM

## 2016-05-04 ENCOUNTER — Inpatient Hospital Stay (HOSPITAL_COMMUNITY): Payer: 59

## 2016-05-04 ENCOUNTER — Encounter (HOSPITAL_COMMUNITY): Payer: Self-pay | Admitting: *Deleted

## 2016-05-04 LAB — BASIC METABOLIC PANEL WITH GFR
Anion gap: 6 (ref 5–15)
BUN: <5 mg/dL — ABNORMAL LOW (ref 6–20)
CO2: 22 mmol/L (ref 22–32)
Calcium: 7.6 mg/dL — ABNORMAL LOW (ref 8.9–10.3)
Chloride: 105 mmol/L (ref 101–111)
Creatinine, Ser: 0.7 mg/dL (ref 0.44–1.00)
GFR calc Af Amer: >60 mL/min
GFR calc non Af Amer: >60 mL/min
Glucose, Bld: 105 mg/dL — ABNORMAL HIGH (ref 65–99)
Potassium: 3.4 mmol/L — ABNORMAL LOW (ref 3.5–5.1)
Sodium: 133 mmol/L — ABNORMAL LOW (ref 135–145)

## 2016-05-04 LAB — CBC
HCT: 32 % — ABNORMAL LOW (ref 36.0–46.0)
HEMOGLOBIN: 10.3 g/dL — AB (ref 12.0–15.0)
MCH: 30.9 pg (ref 26.0–34.0)
MCHC: 32.2 g/dL (ref 30.0–36.0)
MCV: 96.1 fL (ref 78.0–100.0)
Platelets: 453 10*3/uL — ABNORMAL HIGH (ref 150–400)
RBC: 3.33 MIL/uL — ABNORMAL LOW (ref 3.87–5.11)
RDW: 12.2 % (ref 11.5–15.5)
WBC: 10.1 10*3/uL (ref 4.0–10.5)

## 2016-05-04 MED ORDER — FLEET ENEMA 7-19 GM/118ML RE ENEM
1.0000 | ENEMA | Freq: Once | RECTAL | Status: AC
  Administered 2016-05-04: 1 via RECTAL
  Filled 2016-05-04: qty 1

## 2016-05-04 MED ORDER — BISACODYL 10 MG RE SUPP
10.0000 mg | Freq: Once | RECTAL | Status: AC
  Administered 2016-05-04: 10 mg via RECTAL
  Filled 2016-05-04: qty 1

## 2016-05-04 MED ORDER — ACETAMINOPHEN 325 MG PO TABS
650.0000 mg | ORAL_TABLET | Freq: Four times a day (QID) | ORAL | Status: DC | PRN
  Administered 2016-05-04: 650 mg via ORAL

## 2016-05-04 MED ORDER — POLYETHYLENE GLYCOL 3350 17 G PO PACK
17.0000 g | PACK | Freq: Every day | ORAL | Status: DC
  Administered 2016-05-04 – 2016-05-10 (×6): 17 g via ORAL
  Filled 2016-05-04 (×6): qty 1

## 2016-05-04 NOTE — Progress Notes (Signed)
Subjective: Feeling better this morning.  Objective: Vital signs in last 24 hours: Temp:  [98.4 F (36.9 C)-101.9 F (38.8 C)] 98.4 F (36.9 C) (08/28 0602) Pulse Rate:  [83-94] 93 (08/28 0602) Resp:  [15-24] 17 (08/28 0602) BP: (115-141)/(57-90) 127/76 (08/28 0602) SpO2:  [97 %-100 %] 100 % (08/28 0602) Weight:  [96.2 kg (212 lb)-98.2 kg (216 lb 8 oz)] 98.2 kg (216 lb 8 oz) (08/27 2323) Last BM Date: 04/30/16  Intake/Output from previous day: 08/27 0701 - 08/28 0700 In: 3209 [P.O.:480; I.V.:2629; IV Piggyback:100] Out: 650 [Urine:650] Intake/Output this shift: No intake/output data recorded.  Abdomen soft, almost non-tender  Lab Results:   Recent Labs  05/03/16 1745 05/04/16 0334  WBC 10.7* 10.1  HGB 12.7 10.3*  HCT 38.3 32.0*  PLT 542* 453*   BMET  Recent Labs  05/03/16 1745 05/04/16 0334  NA 129* 133*  K 3.5 3.4*  CL 102 105  CO2 20* 22  GLUCOSE 83 105*  BUN 6 <5*  CREATININE 0.84 0.70  CALCIUM 8.5* 7.6*   PT/INR No results for input(s): LABPROT, INR in the last 72 hours. ABG No results for input(s): PHART, HCO3 in the last 72 hours.  Invalid input(s): PCO2, PO2  Studies/Results: Dg Chest 2 View  Result Date: 05/03/2016 CLINICAL DATA:  Suspected sepsis. Shortness of breath. Recent umbilical hernia repair. EXAM: CHEST  2 VIEW COMPARISON:  CT 08/24/2015.  Portable chest 05/07/2013. FINDINGS: Suboptimal inspiration. The heart size is stable at the upper limits of normal for AP technique. The mediastinal contours are normal. There are linear bibasilar opacities most consistent with atelectasis. No confluent airspace opacity, edema or significant pleural effusion. The bones appear unchanged. IMPRESSION: Subsegmental atelectasis at both lung bases. No consolidation or significant pleural effusion. Electronically Signed   By: Richardean Sale M.D.   On: 05/03/2016 19:24   Ct Abdomen Pelvis W Contrast  Result Date: 05/03/2016 CLINICAL DATA:  Umbilical  hernia repair on 04/13/2016. Now experiencing pain in neck region. Nausea and vomiting. EXAM: CT ABDOMEN AND PELVIS WITH CONTRAST TECHNIQUE: Multidetector CT imaging of the abdomen and pelvis was performed using the standard protocol following bolus administration of intravenous contrast. CONTRAST:  54mL ISOVUE-300 IOPAMIDOL (ISOVUE-300) INJECTION 61% COMPARISON:  10/16/2015 FINDINGS: Areas of linear atelectasis demonstrated in both lung bases. There has been interval closure of the ventral abdominal wall hernia since the prior study. There is a loculated fluid collection demonstrated in anterior abdominal wall along the peritoneal surface and possibly including transverse bowel loops. This measures maximally about 3.7 x 22.7 cm in the axial plane and about 20.9 cm along the sagittal plane. This extends from just below the xiphoid process to just above the symphysis pubis. This may represent abscess or a postoperative hematoma. No active contrast extravasation is noted. There is some infiltration or edema in the mesenteric which may also be postoperative or reactive. No small or large bowel dilatation. The liver, spleen, gallbladder, pancreas, adrenal glands, kidneys, abdominal aorta, inferior vena cava, and retroperitoneal lymph nodes are unremarkable. Stomach, small bowel, and colon are not abnormally distended. No free air in the abdomen. Pelvis: The appendix is normal. Uterus and ovaries are not enlarged. No free or loculated pelvic fluid collections. No pelvic lymphadenopathy. Bladder wall is not thickened. No destructive bone lesions. IMPRESSION: Interval closure of ventral abdominal wall hernia since prior study with development of a large loculated fluid collection demonstrated in the anterior abdominal wall along the peritoneal surface and possibly extending to the transverse  colon loops. This likely represents postoperative abscess or hematoma. Electronically Signed   By: Lucienne Capers M.D.   On:  05/03/2016 20:24    Anti-infectives: Anti-infectives    Start     Dose/Rate Route Frequency Ordered Stop   05/04/16 0800  ciprofloxacin (CIPRO) IVPB 400 mg     400 mg 200 mL/hr over 60 Minutes Intravenous Every 12 hours 05/03/16 2313     05/04/16 0400  metroNIDAZOLE (FLAGYL) IVPB 500 mg     500 mg 100 mL/hr over 60 Minutes Intravenous Every 8 hours 05/03/16 2313     05/03/16 1930  ciprofloxacin (CIPRO) IVPB 400 mg     400 mg 200 mL/hr over 60 Minutes Intravenous  Once 05/03/16 1916 05/03/16 2139   05/03/16 1930  metroNIDAZOLE (FLAGYL) IVPB 500 mg     500 mg 100 mL/hr over 60 Minutes Intravenous  Once 05/03/16 1916 05/03/16 2139      Assessment/Plan: s/p * No surgery found *  Abdominal pain post op.  I think a lot of this may be constipation. She just had her drains removed 3 days ago and I expect fluid to still be present for quite some time post op. Her WBC is normal this morning and the fluid does not look rim enhancing.  Will hold on drain placement. Plan miralax and suppos.  LOS: 1 day    Abrham Maslowski A 05/04/2016

## 2016-05-05 ENCOUNTER — Encounter (HOSPITAL_COMMUNITY): Payer: Self-pay | Admitting: General Surgery

## 2016-05-05 ENCOUNTER — Inpatient Hospital Stay (HOSPITAL_COMMUNITY): Payer: 59

## 2016-05-05 LAB — GRAM STAIN

## 2016-05-05 LAB — URINE CULTURE

## 2016-05-05 LAB — CBC
HEMATOCRIT: 31 % — AB (ref 36.0–46.0)
Hemoglobin: 10.1 g/dL — ABNORMAL LOW (ref 12.0–15.0)
MCH: 31.1 pg (ref 26.0–34.0)
MCHC: 32.6 g/dL (ref 30.0–36.0)
MCV: 95.4 fL (ref 78.0–100.0)
PLATELETS: 396 10*3/uL (ref 150–400)
RBC: 3.25 MIL/uL — AB (ref 3.87–5.11)
RDW: 12.2 % (ref 11.5–15.5)
WBC: 10.8 10*3/uL — AB (ref 4.0–10.5)

## 2016-05-05 LAB — PROTIME-INR
INR: 1.38
Prothrombin Time: 17.1 seconds — ABNORMAL HIGH (ref 11.4–15.2)

## 2016-05-05 MED ORDER — FENTANYL CITRATE (PF) 100 MCG/2ML IJ SOLN
INTRAMUSCULAR | Status: AC
  Filled 2016-05-05: qty 2

## 2016-05-05 MED ORDER — LIDOCAINE-EPINEPHRINE 1 %-1:100000 IJ SOLN
INTRAMUSCULAR | Status: AC
  Filled 2016-05-05: qty 1

## 2016-05-05 MED ORDER — MIDAZOLAM HCL 2 MG/2ML IJ SOLN
INTRAMUSCULAR | Status: AC | PRN
  Administered 2016-05-05: 2 mg via INTRAVENOUS

## 2016-05-05 MED ORDER — FENTANYL CITRATE (PF) 100 MCG/2ML IJ SOLN
INTRAMUSCULAR | Status: AC | PRN
  Administered 2016-05-05 (×2): 50 ug via INTRAVENOUS

## 2016-05-05 MED ORDER — MIDAZOLAM HCL 2 MG/2ML IJ SOLN
INTRAMUSCULAR | Status: AC
  Filled 2016-05-05: qty 2

## 2016-05-05 NOTE — Progress Notes (Signed)
Subjective: Fever yesterday Had BM Still with pain  Objective: Vital signs in last 24 hours: Temp:  [98.8 F (37.1 C)-101.4 F (38.6 C)] 98.8 F (37.1 C) (08/29 0500) Pulse Rate:  [84-92] 84 (08/29 0500) Resp:  [17-18] 18 (08/29 0500) BP: (93-119)/(52-74) 99/54 (08/29 0500) SpO2:  [94 %-100 %] 96 % (08/29 0500) Last BM Date: 05/04/16  Intake/Output from previous day: 08/28 0701 - 08/29 0700 In: 3431.7 [P.O.:240; I.V.:2491.7; IV Piggyback:700] Out: 1700 [Urine:1700] Intake/Output this shift: No intake/output data recorded.  Abdomen soft, minimally tender, non erythema  Lab Results:   Recent Labs  05/04/16 0334 05/05/16 0725  WBC 10.1 10.8*  HGB 10.3* 10.1*  HCT 32.0* 31.0*  PLT 453* 396   BMET  Recent Labs  05/03/16 1745 05/04/16 0334  NA 129* 133*  K 3.5 3.4*  CL 102 105  CO2 20* 22  GLUCOSE 83 105*  BUN 6 <5*  CREATININE 0.84 0.70  CALCIUM 8.5* 7.6*   PT/INR No results for input(s): LABPROT, INR in the last 72 hours. ABG No results for input(s): PHART, HCO3 in the last 72 hours.  Invalid input(s): PCO2, PO2  Studies/Results: Dg Chest 2 View  Result Date: 05/03/2016 CLINICAL DATA:  Suspected sepsis. Shortness of breath. Recent umbilical hernia repair. EXAM: CHEST  2 VIEW COMPARISON:  CT 08/24/2015.  Portable chest 05/07/2013. FINDINGS: Suboptimal inspiration. The heart size is stable at the upper limits of normal for AP technique. The mediastinal contours are normal. There are linear bibasilar opacities most consistent with atelectasis. No confluent airspace opacity, edema or significant pleural effusion. The bones appear unchanged. IMPRESSION: Subsegmental atelectasis at both lung bases. No consolidation or significant pleural effusion. Electronically Signed   By: Richardean Sale M.D.   On: 05/03/2016 19:24   Ct Abdomen Pelvis W Contrast  Result Date: 05/03/2016 CLINICAL DATA:  Umbilical hernia repair on 04/13/2016. Now experiencing pain in neck  region. Nausea and vomiting. EXAM: CT ABDOMEN AND PELVIS WITH CONTRAST TECHNIQUE: Multidetector CT imaging of the abdomen and pelvis was performed using the standard protocol following bolus administration of intravenous contrast. CONTRAST:  51mL ISOVUE-300 IOPAMIDOL (ISOVUE-300) INJECTION 61% COMPARISON:  10/16/2015 FINDINGS: Areas of linear atelectasis demonstrated in both lung bases. There has been interval closure of the ventral abdominal wall hernia since the prior study. There is a loculated fluid collection demonstrated in anterior abdominal wall along the peritoneal surface and possibly including transverse bowel loops. This measures maximally about 3.7 x 22.7 cm in the axial plane and about 20.9 cm along the sagittal plane. This extends from just below the xiphoid process to just above the symphysis pubis. This may represent abscess or a postoperative hematoma. No active contrast extravasation is noted. There is some infiltration or edema in the mesenteric which may also be postoperative or reactive. No small or large bowel dilatation. The liver, spleen, gallbladder, pancreas, adrenal glands, kidneys, abdominal aorta, inferior vena cava, and retroperitoneal lymph nodes are unremarkable. Stomach, small bowel, and colon are not abnormally distended. No free air in the abdomen. Pelvis: The appendix is normal. Uterus and ovaries are not enlarged. No free or loculated pelvic fluid collections. No pelvic lymphadenopathy. Bladder wall is not thickened. No destructive bone lesions. IMPRESSION: Interval closure of ventral abdominal wall hernia since prior study with development of a large loculated fluid collection demonstrated in the anterior abdominal wall along the peritoneal surface and possibly extending to the transverse colon loops. This likely represents postoperative abscess or hematoma. Electronically Signed   By:  Lucienne Capers M.D.   On: 05/03/2016 20:24   Dg Chest Port 1 View  Result Date:  05/04/2016 CLINICAL DATA:  Fever EXAM: PORTABLE CHEST 1 VIEW COMPARISON:  May 03, 2016 FINDINGS: There is patchy airspace consolidation in the medial right base. The lungs elsewhere are clear. Heart is upper normal in size with pulmonary vascularity within normal limits. No adenopathy. No bone lesions. IMPRESSION: Patchy airspace consolidation medial right base. Lungs elsewhere clear. Electronically Signed   By: Lowella Grip III M.D.   On: 05/04/2016 21:38    Anti-infectives: Anti-infectives    Start     Dose/Rate Route Frequency Ordered Stop   05/04/16 0800  ciprofloxacin (CIPRO) IVPB 400 mg     400 mg 200 mL/hr over 60 Minutes Intravenous Every 12 hours 05/03/16 2313     05/04/16 0400  metroNIDAZOLE (FLAGYL) IVPB 500 mg     500 mg 100 mL/hr over 60 Minutes Intravenous Every 8 hours 05/03/16 2313     05/03/16 1930  ciprofloxacin (CIPRO) IVPB 400 mg     400 mg 200 mL/hr over 60 Minutes Intravenous  Once 05/03/16 1916 05/03/16 2139   05/03/16 1930  metroNIDAZOLE (FLAGYL) IVPB 500 mg     500 mg 100 mL/hr over 60 Minutes Intravenous  Once 05/03/16 1916 05/03/16 2139      Assessment/Plan:  Postop fever  Given fever and still marginally elevated WBC, need to see if fluid collection is abscess. Will consult IR to see if drain can be placed or aspiration . Patient agrees  LOS: 2 days    [name] A 05/05/2016

## 2016-05-05 NOTE — Sedation Documentation (Signed)
Patient is resting comfortably. 

## 2016-05-05 NOTE — Sedation Documentation (Signed)
Patient denies pain and is resting comfortably.  

## 2016-05-05 NOTE — Procedures (Signed)
Technically successful CT guided placed of a 10 Fr drainage catheter placement into the left lower abdomen yielding 600 cc of serous fluid.    An aspirated sample was sent to the laboratory for analysis.    EBL: Minimal  No immediate post procedural complications.   Ronny Bacon, MD Pager #: [phone]

## 2016-05-05 NOTE — Sedation Documentation (Signed)
Additional NS given during procedure.

## 2016-05-05 NOTE — Consult Note (Signed)
  Chief Complaint: abdominal wall fluid collection  Referring Physician:Dr. Douglas Blackman  Supervising Physician: Watts, John  Patient Status: In-pt   HPI: [name] is an 35 y.o. female who underwent a ventral hernia repair with TAR on 04/13/16.  The patient was readmitted two days ago with some fevers and abdominal pain.  She had a CT scan that revealed a large abdominal wall fluid collection, unable to determine if it is a hematoma vs abscess.  The patient is on abx therapy, but continues to run some fevers.  We have been asked today to evaluate for aspiration vs drain placement.  Past Medical History:  Past Medical History:  Diagnosis Date  . Complication of anesthesia    "difficulty waking up and will fight when woken up"  . Gallstones   . Graves disease   . Hernia, ventral   . History of blood clots   . History of pulmonary embolus (PE)   . Hypertension   . Hypothyroid   . Shortness of breath dyspnea     Past Surgical History:  Past Surgical History:  Procedure Laterality Date  . BREAST SURGERY     cyst removed from right  . CESAREAN SECTION    . INCISIONAL HERNIA REPAIR  04/13/2016  . INCISIONAL HERNIA REPAIR N/A 04/13/2016   Procedure: INCISIONAL HERNIA REPAIR;  Surgeon: Douglas Blackman, MD;  Location: [facility] OR;  Service: General;  Laterality: N/A;  . INSERTION OF MESH N/A 04/13/2016   Procedure: INSERTION OF MESH;  Surgeon: Douglas Blackman, MD;  Location: [facility] OR;  Service: General;  Laterality: N/A;  . PILONIDAL CYST / SINUS EXCISION    . removal of sweat gland Bilateral    arms    Family History:  Family History  Problem Relation Age of Onset  . Hypertension Other   . Hyperlipidemia Other     Social History:  reports that she has never smoked. She has never used smokeless tobacco. She reports that she drinks alcohol. She reports that she does not use drugs.  Allergies:  Allergies  Allergen Reactions  . Penicillins Hives    Has patient had a PCN  reaction causing immediate rash, facial/tongue/throat swelling, SOB or lightheadedness with hypotension: Yes Has patient had a PCN reaction causing severe rash involving mucus membranes or skin necrosis: Yes Has patient had a PCN reaction that required hospitalization Yes Has patient had a PCN reaction occurring within the last 10 years: No If all of the above answers are "NO", then may proceed with Cephalosporin use.     Medications: Medications reviewed in Epic  Please HPI for pertinent positives, otherwise complete 10 system ROS negative.  Mallampati Score: MD Evaluation Airway: WNL Heart: WNL Abdomen: WNL Chest/ Lungs: WNL ASA  Classification: 2 Mallampati/Airway Score: Two  Physical Exam: BP (!) 99/54 (BP Location: Right Arm)   Pulse 84   Temp 98.8 F (37.1 C) (Oral)   Resp 18   Ht 5' 1" (1.549 m)   Wt 216 lb 8 oz (98.2 kg)   LMP 04/13/2016 Comment: reviewed with pt. BEFORE imaging  SpO2 96%   BMI 40.91 kg/m  Body mass index is 40.91 kg/m. General: pleasant, obese black female who is laying in bed in NAD HEENT: head is normocephalic, atraumatic.  Sclera are noninjected.  PERRL.  Ears and nose without any masses or lesions.  Mouth is pink and moist Heart: regular, rate, and rhythm.  Normal s1,s2. No obvious murmurs, gallops, or rubs noted.  Palpable radial   and pedal pulses bilaterally Lungs: CTAB, no wheezes, rhonchi, or rales noted.  Respiratory effort nonlabored Abd: soft, mildly tender in upper abdomen, ND, +BS, no masses, hernias, or organomegaly.  Well healed midline incision MS: all 4 extremities are symmetrical with no cyanosis, clubbing, or edema. Psych: A&Ox3 with an appropriate affect.   Labs: Results for orders placed or performed during the hospital encounter of 05/03/16 (from the past 48 hour(s))  Comprehensive metabolic panel     Status: Abnormal   Collection Time: 05/03/16  5:45 PM  Result Value Ref Range   Sodium 129 (L) 135 - 145 mmol/L    Potassium 3.5 3.5 - 5.1 mmol/L   Chloride 102 101 - 111 mmol/L   CO2 20 (L) 22 - 32 mmol/L   Glucose, Bld 83 65 - 99 mg/dL   BUN 6 6 - 20 mg/dL   Creatinine, Ser 0.84 0.44 - 1.00 mg/dL   Calcium 8.5 (L) 8.9 - 10.3 mg/dL   Total Protein 6.6 6.5 - 8.1 g/dL   Albumin 3.4 (L) 3.5 - 5.0 g/dL   AST 18 15 - 41 U/L   ALT 13 (L) 14 - 54 U/L   Alkaline Phosphatase 64 38 - 126 U/L   Total Bilirubin 1.6 (H) 0.3 - 1.2 mg/dL   GFR calc non Af Amer >60 >60 mL/min   GFR calc Af Amer >60 >60 mL/min    Comment: (NOTE) The eGFR has been calculated using the CKD EPI equation. This calculation has not been validated in all clinical situations. eGFR's persistently <60 mL/min signify possible Chronic Kidney Disease.    Anion gap 7 5 - 15  CBC with Differential     Status: Abnormal   Collection Time: 05/03/16  5:45 PM  Result Value Ref Range   WBC 10.7 (H) 4.0 - 10.5 K/uL   RBC 4.00 3.87 - 5.11 MIL/uL   Hemoglobin 12.7 12.0 - 15.0 g/dL   HCT 38.3 36.0 - 46.0 %   MCV 95.8 78.0 - 100.0 fL   MCH 31.8 26.0 - 34.0 pg   MCHC 33.2 30.0 - 36.0 g/dL   RDW 12.0 11.5 - 15.5 %   Platelets 542 (H) 150 - 400 K/uL   Neutrophils Relative % 74 %   Neutro Abs 8.0 (H) 1.7 - 7.7 K/uL   Lymphocytes Relative 14 %   Lymphs Abs 1.5 0.7 - 4.0 K/uL   Monocytes Relative 7 %   Monocytes Absolute 0.7 0.1 - 1.0 K/uL   Eosinophils Relative 4 %   Eosinophils Absolute 0.4 0.0 - 0.7 K/uL   Basophils Relative 1 %   Basophils Absolute 0.1 0.0 - 0.1 K/uL  I-Stat beta hCG blood, ED     Status: None   Collection Time: 05/03/16  6:04 PM  Result Value Ref Range   I-stat hCG, quantitative <5.0 <5 mIU/mL   Comment 3            Comment:   GEST. AGE      CONC.  (mIU/mL)   <=1 WEEK        5 - 50     2 WEEKS       50 - 500     3 WEEKS       100 - 10,000     4 WEEKS     1,000 - 30,000        FEMALE AND NON-PREGNANT FEMALE:     LESS THAN 5 mIU/mL   I-Stat CG4 Lactic Acid, ED       Status: None   Collection Time: 05/03/16  6:07 PM    Result Value Ref Range   Lactic Acid, Venous 1.39 0.5 - 1.9 mmol/L  Urinalysis, Routine w reflex microscopic     Status: Abnormal   Collection Time: 05/03/16  7:50 PM  Result Value Ref Range   Color, Urine YELLOW YELLOW   APPearance CLOUDY (A) CLEAR   Specific Gravity, Urine 1.025 1.005 - 1.030   pH 7.0 5.0 - 8.0   Glucose, UA NEGATIVE NEGATIVE mg/dL   Hgb urine dipstick NEGATIVE NEGATIVE   Bilirubin Urine NEGATIVE NEGATIVE   Ketones, ur NEGATIVE NEGATIVE mg/dL   Protein, ur NEGATIVE NEGATIVE mg/dL   Nitrite NEGATIVE NEGATIVE   Leukocytes, UA NEGATIVE NEGATIVE    Comment: MICROSCOPIC NOT DONE ON URINES WITH NEGATIVE PROTEIN, BLOOD, LEUKOCYTES, NITRITE, OR GLUCOSE <1000 mg/dL.  I-Stat CG4 Lactic Acid, ED     Status: Abnormal   Collection Time: 05/03/16 10:42 PM  Result Value Ref Range   Lactic Acid, Venous 0.40 (L) 0.5 - 1.9 mmol/L  Basic metabolic panel     Status: Abnormal   Collection Time: 05/04/16  3:34 AM  Result Value Ref Range   Sodium 133 (L) 135 - 145 mmol/L   Potassium 3.4 (L) 3.5 - 5.1 mmol/L   Chloride 105 101 - 111 mmol/L   CO2 22 22 - 32 mmol/L   Glucose, Bld 105 (H) 65 - 99 mg/dL   BUN <5 (L) 6 - 20 mg/dL   Creatinine, Ser 0.70 0.44 - 1.00 mg/dL   Calcium 7.6 (L) 8.9 - 10.3 mg/dL   GFR calc non Af Amer >60 >60 mL/min   GFR calc Af Amer >60 >60 mL/min    Comment: (NOTE) The eGFR has been calculated using the CKD EPI equation. This calculation has not been validated in all clinical situations. eGFR's persistently <60 mL/min signify possible Chronic Kidney Disease.    Anion gap 6 5 - 15  CBC     Status: Abnormal   Collection Time: 05/04/16  3:34 AM  Result Value Ref Range   WBC 10.1 4.0 - 10.5 K/uL   RBC 3.33 (L) 3.87 - 5.11 MIL/uL   Hemoglobin 10.3 (L) 12.0 - 15.0 g/dL   HCT 32.0 (L) 36.0 - 46.0 %   MCV 96.1 78.0 - 100.0 fL   MCH 30.9 26.0 - 34.0 pg   MCHC 32.2 30.0 - 36.0 g/dL   RDW 12.2 11.5 - 15.5 %   Platelets 453 (H) 150 - 400 K/uL  CBC      Status: Abnormal   Collection Time: 05/05/16  7:25 AM  Result Value Ref Range   WBC 10.8 (H) 4.0 - 10.5 K/uL   RBC 3.25 (L) 3.87 - 5.11 MIL/uL   Hemoglobin 10.1 (L) 12.0 - 15.0 g/dL   HCT 31.0 (L) 36.0 - 46.0 %   MCV 95.4 78.0 - 100.0 fL   MCH 31.1 26.0 - 34.0 pg   MCHC 32.6 30.0 - 36.0 g/dL   RDW 12.2 11.5 - 15.5 %   Platelets 396 150 - 400 K/uL    Imaging: Dg Chest 2 View  Result Date: 05/03/2016 CLINICAL DATA:  Suspected sepsis. Shortness of breath. Recent umbilical hernia repair. EXAM: CHEST  2 VIEW COMPARISON:  CT 08/24/2015.  Portable chest 05/07/2013. FINDINGS: Suboptimal inspiration. The heart size is stable at the upper limits of normal for AP technique. The mediastinal contours are normal. There are linear bibasilar opacities most consistent with atelectasis. No confluent  airspace opacity, edema or significant pleural effusion. The bones appear unchanged. IMPRESSION: Subsegmental atelectasis at both lung bases. No consolidation or significant pleural effusion. Electronically Signed   By: William  Veazey M.D.   On: 05/03/2016 19:24   Ct Abdomen Pelvis W Contrast  Result Date: 05/03/2016 CLINICAL DATA:  Umbilical hernia repair on 04/13/2016. Now experiencing pain in neck region. Nausea and vomiting. EXAM: CT ABDOMEN AND PELVIS WITH CONTRAST TECHNIQUE: Multidetector CT imaging of the abdomen and pelvis was performed using the standard protocol following bolus administration of intravenous contrast. CONTRAST:  75mL ISOVUE-300 IOPAMIDOL (ISOVUE-300) INJECTION 61% COMPARISON:  10/16/2015 FINDINGS: Areas of linear atelectasis demonstrated in both lung bases. There has been interval closure of the ventral abdominal wall hernia since the prior study. There is a loculated fluid collection demonstrated in anterior abdominal wall along the peritoneal surface and possibly including transverse bowel loops. This measures maximally about 3.7 x 22.7 cm in the axial plane and about 20.9 cm along the  sagittal plane. This extends from just below the xiphoid process to just above the symphysis pubis. This may represent abscess or a postoperative hematoma. No active contrast extravasation is noted. There is some infiltration or edema in the mesenteric which may also be postoperative or reactive. No small or large bowel dilatation. The liver, spleen, gallbladder, pancreas, adrenal glands, kidneys, abdominal aorta, inferior vena cava, and retroperitoneal lymph nodes are unremarkable. Stomach, small bowel, and colon are not abnormally distended. No free air in the abdomen. Pelvis: The appendix is normal. Uterus and ovaries are not enlarged. No free or loculated pelvic fluid collections. No pelvic lymphadenopathy. Bladder wall is not thickened. No destructive bone lesions. IMPRESSION: Interval closure of ventral abdominal wall hernia since prior study with development of a large loculated fluid collection demonstrated in the anterior abdominal wall along the peritoneal surface and possibly extending to the transverse colon loops. This likely represents postoperative abscess or hematoma. Electronically Signed   By: William  Stevens M.D.   On: 05/03/2016 20:24   Dg Chest Port 1 View  Result Date: 05/04/2016 CLINICAL DATA:  Fever EXAM: PORTABLE CHEST 1 VIEW COMPARISON:  May 03, 2016 FINDINGS: There is patchy airspace consolidation in the medial right base. The lungs elsewhere are clear. Heart is upper normal in size with pulmonary vascularity within normal limits. No adenopathy. No bone lesions. IMPRESSION: Patchy airspace consolidation medial right base. Lungs elsewhere clear. Electronically Signed   By: William  Woodruff III M.D.   On: 05/04/2016 21:38    Assessment/Plan 1. S/p ventral hernia repair with TAR with abdominal wall fluid collection -we will plan for aspiration vs drain placement today depending on the type of fluid that is evacuated. -she has been NPO -her last dose of Xarelto was on  8/26. -PT/INR pending, but was normal on the day of surgery -other labs and vitals reviewed -Risks and Benefits discussed with the patient including bleeding, infection, damage to adjacent structures, bowel perforation/fistula connection, and sepsis. All of the patient's questions were answered, patient is agreeable to proceed. Consent signed and in chart.   Thank you for this interesting consult.  I greatly enjoyed meeting [name] and look forward to participating in their care.  A copy of this report was sent to the requesting provider on this date.  Electronically Signed: , E 05/05/2016, 10:31 AM   I spent a total of 40 Minutes    in face to face in clinical consultation, greater than 50% of which was counseling/coordinating care for   abdominal wall fluid collection

## 2016-05-06 NOTE — Progress Notes (Signed)
Subjective: PT doing well today. Tol Po Min pain over drain site  Objective: Vital signs in last 24 hours: Temp:  [98.5 F (36.9 C)-99.3 F (37.4 C)] 99.3 F (37.4 C) (08/30 0449) Pulse Rate:  [77-91] 91 (08/30 0449) Resp:  [17-23] 20 (08/29 1811) BP: (79-121)/(52-70) 113/69 (08/30 0449) SpO2:  [96 %-100 %] 96 % (08/30 0449) Last BM Date: 05/04/16  Intake/Output from previous day: 08/29 0701 - 08/30 0700 In: 2728.3 [P.O.:120; I.V.:2508.3; IV Piggyback:100] Out: 1900 [Urine:900; Drains:1000] Intake/Output this shift: No intake/output data recorded.  General appearance: alert and cooperative GI: soft, JP with Serous output, no reound/guarding  Lab Results:   Recent Labs  05/04/16 0334 05/05/16 0725  WBC 10.1 10.8*  HGB 10.3* 10.1*  HCT 32.0* 31.0*  PLT 453* 396   BMET  Recent Labs  05/03/16 1745 05/04/16 0334  NA 129* 133*  K 3.5 3.4*  CL 102 105  CO2 20* 22  GLUCOSE 83 105*  BUN 6 <5*  CREATININE 0.84 0.70  CALCIUM 8.5* 7.6*   PT/INR  Recent Labs  05/05/16 1028  LABPROT 17.1*  INR 1.38   ABG No results for input(s): PHART, HCO3 in the last 72 hours.  Invalid input(s): PCO2, PO2  Studies/Results: Dg Chest Port 1 View  Result Date: 05/04/2016 CLINICAL DATA:  Fever EXAM: PORTABLE CHEST 1 VIEW COMPARISON:  May 03, 2016 FINDINGS: There is patchy airspace consolidation in the medial right base. The lungs elsewhere are clear. Heart is upper normal in size with pulmonary vascularity within normal limits. No adenopathy. No bone lesions. IMPRESSION: Patchy airspace consolidation medial right base. Lungs elsewhere clear. Electronically Signed   By: Lowella Grip III M.D.   On: 05/04/2016 21:38   Ct Image Guided Fluid Drain By Catheter  Result Date: 05/05/2016 INDICATION: History of ventral hernia repair, now admitted with abdominal pain and fever with CT scan of the abdomen pelvis demonstrating an indeterminate fluid collection within the  ventral wall of the abdomen and pelvis. Request made for either CT-guided aspiration or placement of a percutaneous drainage catheter. EXAM: CT IMAGE GUIDED FLUID DRAIN BY CATHETER COMPARISON:  CT abdomen pelvis -05/03/2016 MEDICATIONS: The patient is currently admitted to the hospital and receiving intravenous antibiotics. The antibiotics were administered within an appropriate time frame prior to the initiation of the procedure. ANESTHESIA/SEDATION: Moderate (conscious) sedation was employed during this procedure. A total of Versed 2 mg and Fentanyl 100 mcg was administered intravenously. Moderate Sedation Time: 20 minutes. The patient's level of consciousness and vital signs were monitored continuously by radiology nursing throughout the procedure under my direct supervision. CONTRAST:  None COMPLICATIONS: None immediate. PROCEDURE: Informed written consent was obtained from the patient after a discussion of the risks, benefits and alternatives to treatment. The patient was placed supine on the CT gantry and a pre procedural CT was performed re-demonstrating the known abscess/fluid collection within the ventral aspect of the abdominal/ pelvic wall with dominant caudal component measuring approximately 23.3 x 4.2 cm (image 48, series 2). The procedure was planned. A timeout was performed prior to the initiation of the procedure. The skin overlying the left lateral lower at the was prepped and draped in the usual sterile fashion. The overlying soft tissues were anesthetized with 1% lidocaine with epinephrine. Appropriate trajectory was planned with the use of a 22 gauge spinal needle. An 18 gauge trocar needle was advanced into the abscess/fluid collection and a short Amplatz super stiff wire was coiled within the collection. Appropriate positioning was  confirmed with a limited CT scan. The tract was serially dilated allowing placement of a 10 [place] all-purpose drainage catheter. Appropriate positioning was  confirmed with a limited postprocedural CT scan. Approximately 600 Ml of serous fluid was aspirated. The tube was connected to a JP bulb and sutured in place. A dressing was placed. The patient tolerated the procedure well without immediate post procedural complication. IMPRESSION: Successful CT guided placement of a 10 [place] all purpose drain catheter into the indeterminate fluid collection within the ventral aspect of the abdominal/pelvic wall with aspiration of approximately 600 mL of serous fluid. Samples were sent to the laboratory as requested by the ordering clinical team. The etiology of this fluid collection remains indeterminate with differential considerations including seroma (favored), abscess and superinfected hematoma. Electronically Signed   By: Sandi Mariscal M.D.   On: 05/05/2016 15:08    Anti-infectives: Anti-infectives    Start     Dose/Rate Route Frequency Ordered Stop   05/04/16 0800  ciprofloxacin (CIPRO) IVPB 400 mg     400 mg 200 mL/hr over 60 Minutes Intravenous Every 12 hours 05/03/16 2313     05/04/16 0400  metroNIDAZOLE (FLAGYL) IVPB 500 mg     500 mg 100 mL/hr over 60 Minutes Intravenous Every 8 hours 05/03/16 2313     05/03/16 1930  ciprofloxacin (CIPRO) IVPB 400 mg     400 mg 200 mL/hr over 60 Minutes Intravenous  Once 05/03/16 1916 05/03/16 2139   05/03/16 1930  metroNIDAZOLE (FLAGYL) IVPB 500 mg     500 mg 100 mL/hr over 60 Minutes Intravenous  Once 05/03/16 1916 05/03/16 2139      Assessment/Plan: 35 y/o F s/p IHR with mesh with post op seroma. con't drain Con't abx mobilize  LOS: 3 days    Rosario Jacks., Anne Hahn 05/06/2016

## 2016-05-06 NOTE — Progress Notes (Signed)
Supervising Physician: Sandi Mariscal  Patient Status:  Inpatient   Subjective: LLQ perc drain to probable seroma given serous output Feeling better today  Allergies: Penicillins  Medications:  Current Facility-Administered Medications:  .  acetaminophen (TYLENOL) tablet 650 mg, 650 mg, Oral, Q6H PRN, Clovis Riley, MD, 650 mg at 05/04/16 1840 .  ciprofloxacin (CIPRO) IVPB 400 mg, 400 mg, Intravenous, Q12H, Georganna Skeans, MD, 400 mg at 05/06/16 0800 .  dextrose 5 % and 0.45 % NaCl with KCl 20 mEq/L infusion, , Intravenous, Continuous, Georganna Skeans, MD, Last Rate: 100 mL/hr at 05/05/16 1455 .  diphenhydrAMINE (BENADRYL) capsule 25 mg, 25 mg, Oral, Q6H PRN, Georganna Skeans, MD .  HYDROmorphone (DILAUDID) injection 0.5-1 mg, 0.5-1 mg, Intravenous, Q3H PRN, Georganna Skeans, MD, 1 mg at 05/06/16 1027 .  levothyroxine (SYNTHROID, LEVOTHROID) tablet 112 mcg, 112 mcg, Oral, QAC breakfast, Georganna Skeans, MD, 112 mcg at 05/06/16 0800 .  lisinopril (PRINIVIL,ZESTRIL) tablet 5 mg, 5 mg, Oral, Daily, Georganna Skeans, MD, 5 mg at 05/06/16 1026 .  metoprolol (LOPRESSOR) tablet 50 mg, 50 mg, Oral, BID, Georganna Skeans, MD, 50 mg at 05/06/16 1026 .  metroNIDAZOLE (FLAGYL) IVPB 500 mg, 500 mg, Intravenous, Q8H, Georganna Skeans, MD, 500 mg at 05/06/16 0517 .  ondansetron (ZOFRAN-ODT) disintegrating tablet 4 mg, 4 mg, Oral, Q6H PRN, 4 mg at 05/06/16 1026 **OR** ondansetron (ZOFRAN) injection 4 mg, 4 mg, Intravenous, Q6H PRN, Georganna Skeans, MD, 4 mg at 05/05/16 1051 .  oxyCODONE-acetaminophen (PERCOCET/ROXICET) 5-325 MG per tablet 1-2 tablet, 1-2 tablet, Oral, Q4H PRN, Georganna Skeans, MD, 2 tablet at 05/06/16 [phone] .  polyethylene glycol (MIRALAX / GLYCOLAX) packet 17 g, 17 g, Oral, Daily, Coralie Keens, MD, 17 g at 05/06/16 1026 .  promethazine (PHENERGAN) tablet 12.5 mg, 12.5 mg, Oral, Q6H PRN, Georganna Skeans, MD, 12.5 mg at 05/05/16 1628    Vital Signs: BP 113/69 (BP Location: Right Arm)    Pulse 91   Temp 99.3 F (37.4 C) (Oral)   Resp 20   Ht 5\' 1"  (1.549 m)   Wt 216 lb 8 oz (98.2 kg)   LMP 04/13/2016 Comment: reviewed with pt. BEFORE imaging  SpO2 96%   BMI 40.91 kg/m   Physical Exam LLQ drain intact, site clean, NT Output serous Cx negative so far   Labs:  CBC:  Recent Labs  04/22/16 0521 05/03/16 1745 05/04/16 0334 05/05/16 0725  WBC 7.8 10.7* 10.1 10.8*  HGB 10.7* 12.7 10.3* 10.1*  HCT 32.7* 38.3 32.0* 31.0*  PLT 435* 542* 453* 396    COAGS:  Recent Labs  04/13/16 0843 05/05/16 1028  INR 0.97 1.38    BMP:  Recent Labs  04/14/16 0439 04/15/16 0525 05/03/16 1745 05/04/16 0334  NA 136 135 129* 133*  K 4.0 4.0 3.5 3.4*  CL 104 107 102 105  CO2 25 21* 20* 22  GLUCOSE 101* 71 83 105*  BUN 5* <5* 6 <5*  CALCIUM 8.1* 7.7* 8.5* 7.6*  CREATININE 0.89 0.65 0.84 0.70  GFRNONAA >60 >60 >60 >60  GFRAA >60 >60 >60 >60    LIVER FUNCTION TESTS:  Recent Labs  08/24/15 1710 10/16/15 1143 03/21/16 1413 05/03/16 1745  BILITOT 0.7 0.8 1.1 1.6*  AST 20 20 20 18   ALT 13* 15 14 13*  ALKPHOS 85 67 71 64  PROT 7.9 7.7 7.2 6.6  ALBUMIN 4.5 4.2 3.9 3.4*    Assessment and Plan: Recent incisional hernia repair Post op seroma S/p perc drain  placement yesterday Cx pending but gram stain (-) Plans per [facility]  Electronically Signed: Ascencion Dike 05/06/2016, 11:21 AM   I spent a total of 15 Minutes at the the patient's bedside AND on the patient's hospital floor or unit, greater than 50% of which was counseling/coordinating care for perc drain of LLQ seroma

## 2016-05-07 MED ORDER — MAGNESIUM CITRATE PO SOLN
0.5000 | Freq: Once | ORAL | Status: AC
  Administered 2016-05-07: 0.5 via ORAL
  Filled 2016-05-07: qty 296

## 2016-05-07 MED ORDER — RIVAROXABAN 20 MG PO TABS
20.0000 mg | ORAL_TABLET | Freq: Every day | ORAL | Status: DC
  Administered 2016-05-07 – 2016-05-09 (×3): 20 mg via ORAL
  Filled 2016-05-07 (×3): qty 1

## 2016-05-07 NOTE — Progress Notes (Signed)
Referring Physician(s): Dr Nedra Hai  Supervising Physician: Corrie Mckusick  Patient Status:  Inpatient  Chief Complaint:  8/29 IR procedure: Technically successful CT guided placed of a 10 Fr drainage catheter placement into the left lower abdomen yielding 600 cc of serous fluid.  Subjective:  Less pain today Feels some better Eating well Output serous color Cx neg so far  Allergies: Penicillins  Medications: Prior to Admission medications   Medication Sig Start Date End Date Taking? Authorizing Provider  diphenhydrAMINE (BENADRYL) 25 MG tablet Take 25-50 mg by mouth every 6 (six) hours as needed. itching   Yes Historical Provider, MD  levothyroxine (SYNTHROID, LEVOTHROID) 112 MCG tablet Take 112 mcg by mouth daily. 01/21/16 01/20/17 Yes Historical Provider, MD  lisinopril (PRINIVIL,ZESTRIL) 5 MG tablet Take 5 mg by mouth daily. 11/11/15 11/10/16 Yes Historical Provider, MD  metoprolol (LOPRESSOR) 50 MG tablet Take 50 mg by mouth 2 (two) times daily. 09/12/15  Yes Historical Provider, MD  oxyCODONE-acetaminophen (PERCOCET/ROXICET) 5-325 MG tablet Take 1-2 tablets by mouth every 4 (four) hours as needed for moderate pain. 04/22/16  Yes Coralie Keens, MD  promethazine (PHENERGAN) 12.5 MG tablet Take 1 tablet (12.5 mg total) by mouth every 6 (six) hours as needed for nausea or vomiting. 04/22/16  Yes Coralie Keens, MD  Rivaroxaban (XARELTO) 20 MG TABS Take 20 mg by mouth every evening.    Yes Historical Provider, MD  cyclobenzaprine (FLEXERIL) 10 MG tablet Take 1 tablet (10 mg total) by mouth 2 (two) times daily as needed for muscle spasms. Patient not taking: Reported on 03/30/2016 08/24/15   Gareth Morgan, MD     Vital Signs: BP 103/69 (BP Location: Right Arm)   Pulse 84   Temp 98 F (36.7 C) (Oral)   Resp 18   Ht 5\' 1"  (1.549 m)   Wt 216 lb 8 oz (98.2 kg)   LMP 04/13/2016 Comment: reviewed with pt. BEFORE imaging  SpO2 96%   BMI 40.91 kg/m   Physical Exam    Constitutional: She is oriented to person, place, and time.  Neurological: She is alert and oriented to person, place, and time.  Skin: Skin is warm and dry.  Skin site is clean and dry NT no bleeding Output 30 cc yesterday 20 cc in JP Serous color Neg Cx   afeb  Imaging: Dg Chest 2 View  Result Date: 05/03/2016 CLINICAL DATA:  Suspected sepsis. Shortness of breath. Recent umbilical hernia repair. EXAM: CHEST  2 VIEW COMPARISON:  CT 08/24/2015.  Portable chest 05/07/2013. FINDINGS: Suboptimal inspiration. The heart size is stable at the upper limits of normal for AP technique. The mediastinal contours are normal. There are linear bibasilar opacities most consistent with atelectasis. No confluent airspace opacity, edema or significant pleural effusion. The bones appear unchanged. IMPRESSION: Subsegmental atelectasis at both lung bases. No consolidation or significant pleural effusion. Electronically Signed   By: Richardean Sale M.D.   On: 05/03/2016 19:24   Ct Abdomen Pelvis W Contrast  Result Date: 05/03/2016 CLINICAL DATA:  Umbilical hernia repair on 04/13/2016. Now experiencing pain in neck region. Nausea and vomiting. EXAM: CT ABDOMEN AND PELVIS WITH CONTRAST TECHNIQUE: Multidetector CT imaging of the abdomen and pelvis was performed using the standard protocol following bolus administration of intravenous contrast. CONTRAST:  86mL ISOVUE-300 IOPAMIDOL (ISOVUE-300) INJECTION 61% COMPARISON:  10/16/2015 FINDINGS: Areas of linear atelectasis demonstrated in both lung bases. There has been interval closure of the ventral abdominal wall hernia since the prior study. There is  a loculated fluid collection demonstrated in anterior abdominal wall along the peritoneal surface and possibly including transverse bowel loops. This measures maximally about 3.7 x 22.7 cm in the axial plane and about 20.9 cm along the sagittal plane. This extends from just below the xiphoid process to just above the  symphysis pubis. This may represent abscess or a postoperative hematoma. No active contrast extravasation is noted. There is some infiltration or edema in the mesenteric which may also be postoperative or reactive. No small or large bowel dilatation. The liver, spleen, gallbladder, pancreas, adrenal glands, kidneys, abdominal aorta, inferior vena cava, and retroperitoneal lymph nodes are unremarkable. Stomach, small bowel, and colon are not abnormally distended. No free air in the abdomen. Pelvis: The appendix is normal. Uterus and ovaries are not enlarged. No free or loculated pelvic fluid collections. No pelvic lymphadenopathy. Bladder wall is not thickened. No destructive bone lesions. IMPRESSION: Interval closure of ventral abdominal wall hernia since prior study with development of a large loculated fluid collection demonstrated in the anterior abdominal wall along the peritoneal surface and possibly extending to the transverse colon loops. This likely represents postoperative abscess or hematoma. Electronically Signed   By: Lucienne Capers M.D.   On: 05/03/2016 20:24   Dg Chest Port 1 View  Result Date: 05/04/2016 CLINICAL DATA:  Fever EXAM: PORTABLE CHEST 1 VIEW COMPARISON:  May 03, 2016 FINDINGS: There is patchy airspace consolidation in the medial right base. The lungs elsewhere are clear. Heart is upper normal in size with pulmonary vascularity within normal limits. No adenopathy. No bone lesions. IMPRESSION: Patchy airspace consolidation medial right base. Lungs elsewhere clear. Electronically Signed   By: Lowella Grip III M.D.   On: 05/04/2016 21:38   Ct Image Guided Fluid Drain By Catheter  Result Date: 05/05/2016 INDICATION: History of ventral hernia repair, now admitted with abdominal pain and fever with CT scan of the abdomen pelvis demonstrating an indeterminate fluid collection within the ventral wall of the abdomen and pelvis. Request made for either CT-guided aspiration or  placement of a percutaneous drainage catheter. EXAM: CT IMAGE GUIDED FLUID DRAIN BY CATHETER COMPARISON:  CT abdomen pelvis -05/03/2016 MEDICATIONS: The patient is currently admitted to the hospital and receiving intravenous antibiotics. The antibiotics were administered within an appropriate time frame prior to the initiation of the procedure. ANESTHESIA/SEDATION: Moderate (conscious) sedation was employed during this procedure. A total of Versed 2 mg and Fentanyl 100 mcg was administered intravenously. Moderate Sedation Time: 20 minutes. The patient's level of consciousness and vital signs were monitored continuously by radiology nursing throughout the procedure under my direct supervision. CONTRAST:  None COMPLICATIONS: None immediate. PROCEDURE: Informed written consent was obtained from the patient after a discussion of the risks, benefits and alternatives to treatment. The patient was placed supine on the CT gantry and a pre procedural CT was performed re-demonstrating the known abscess/fluid collection within the ventral aspect of the abdominal/ pelvic wall with dominant caudal component measuring approximately 23.3 x 4.2 cm (image 48, series 2). The procedure was planned. A timeout was performed prior to the initiation of the procedure. The skin overlying the left lateral lower at the was prepped and draped in the usual sterile fashion. The overlying soft tissues were anesthetized with 1% lidocaine with epinephrine. Appropriate trajectory was planned with the use of a 22 gauge spinal needle. An 18 gauge trocar needle was advanced into the abscess/fluid collection and a short Amplatz super stiff wire was coiled within the collection. Appropriate positioning was  confirmed with a limited CT scan. The tract was serially dilated allowing placement of a 10 [place] all-purpose drainage catheter. Appropriate positioning was confirmed with a limited postprocedural CT scan. Approximately 600 Ml of serous fluid was  aspirated. The tube was connected to a JP bulb and sutured in place. A dressing was placed. The patient tolerated the procedure well without immediate post procedural complication. IMPRESSION: Successful CT guided placement of a 10 French all purpose drain catheter into the indeterminate fluid collection within the ventral aspect of the abdominal/pelvic wall with aspiration of approximately 600 mL of serous fluid. Samples were sent to the laboratory as requested by the ordering clinical team. The etiology of this fluid collection remains indeterminate with differential considerations including seroma (favored), abscess and superinfected hematoma. Electronically Signed   By: Sandi Mariscal M.D.   On: 05/05/2016 15:08    Labs:  CBC:  Recent Labs  04/22/16 0521 05/03/16 1745 05/04/16 0334 05/05/16 0725  WBC 7.8 10.7* 10.1 10.8*  HGB 10.7* 12.7 10.3* 10.1*  HCT 32.7* 38.3 32.0* 31.0*  PLT 435* 542* 453* 396    COAGS:  Recent Labs  04/13/16 0843 05/05/16 1028  INR 0.97 1.38    BMP:  Recent Labs  04/14/16 0439 04/15/16 0525 05/03/16 1745 05/04/16 0334  NA 136 135 129* 133*  K 4.0 4.0 3.5 3.4*  CL 104 107 102 105  CO2 25 21* 20* 22  GLUCOSE 101* 71 83 105*  BUN 5* <5* 6 <5*  CALCIUM 8.1* 7.7* 8.5* 7.6*  CREATININE 0.89 0.65 0.84 0.70  GFRNONAA >60 >60 >60 >60  GFRAA >60 >60 >60 >60    LIVER FUNCTION TESTS:  Recent Labs  08/24/15 1710 10/16/15 1143 03/21/16 1413 05/03/16 1745  BILITOT 0.7 0.8 1.1 1.6*  AST 20 20 20 18   ALT 13* 15 14 13*  ALKPHOS 85 67 71 64  PROT 7.9 7.7 7.2 6.6  ALBUMIN 4.5 4.2 3.9 3.4*    Assessment and Plan:  LLQ seroma drain placed 8/29 Doing well Will follow  Electronically Signed: Jenesis Suchy A 05/07/2016, 11:02 AM   I spent a total of 15 Minutes at the the patient's bedside AND on the patient's hospital floor or unit, greater than 50% of which was counseling/coordinating care for seroma drain

## 2016-05-07 NOTE — Progress Notes (Signed)
Subjective: She feels better Her biggest issue is her chronic constipation.  She was having that pre-op Temps down  Objective: Vital signs in last 24 hours: Temp:  [98 F (36.7 C)-99.4 F (37.4 C)] 98 F (36.7 C) (08/31 0607) Pulse Rate:  [78-87] 84 (08/31 0607) Resp:  [16-18] 18 (08/31 0607) BP: (100-113)/(68-74) 103/69 (08/31 0607) SpO2:  [96 %-99 %] 96 % (08/31 0607) Last BM Date: 05/04/16  Intake/Output from previous day: 08/30 0701 - 08/31 0700 In: 3136.7 [P.O.:720; I.V.:2316.7; IV Piggyback:100] Out: 30 [Drains:30] Intake/Output this shift: No intake/output data recorded.   Abdomen soft, drain serous  Lab Results:   Recent Labs  05/05/16 0725  WBC 10.8*  HGB 10.1*  HCT 31.0*  PLT 396   BMET No results for input(s): NA, K, CL, CO2, GLUCOSE, BUN, CREATININE, CALCIUM in the last 72 hours. PT/INR  Recent Labs  05/05/16 1028  LABPROT 17.1*  INR 1.38   ABG No results for input(s): PHART, HCO3 in the last 72 hours.  Invalid input(s): PCO2, PO2  Studies/Results: Ct Image Guided Fluid Drain By Catheter  Result Date: 05/05/2016 INDICATION: History of ventral hernia repair, now admitted with abdominal pain and fever with CT scan of the abdomen pelvis demonstrating an indeterminate fluid collection within the ventral wall of the abdomen and pelvis. Request made for either CT-guided aspiration or placement of a percutaneous drainage catheter. EXAM: CT IMAGE GUIDED FLUID DRAIN BY CATHETER COMPARISON:  CT abdomen pelvis -05/03/2016 MEDICATIONS: The patient is currently admitted to the hospital and receiving intravenous antibiotics. The antibiotics were administered within an appropriate time frame prior to the initiation of the procedure. ANESTHESIA/SEDATION: Moderate (conscious) sedation was employed during this procedure. A total of Versed 2 mg and Fentanyl 100 mcg was administered intravenously. Moderate Sedation Time: 20 minutes. The patient's level of  consciousness and vital signs were monitored continuously by radiology nursing throughout the procedure under my direct supervision. CONTRAST:  None COMPLICATIONS: None immediate. PROCEDURE: Informed written consent was obtained from the patient after a discussion of the risks, benefits and alternatives to treatment. The patient was placed supine on the CT gantry and a pre procedural CT was performed re-demonstrating the known abscess/fluid collection within the ventral aspect of the abdominal/ pelvic wall with dominant caudal component measuring approximately 23.3 x 4.2 cm (image 48, series 2). The procedure was planned. A timeout was performed prior to the initiation of the procedure. The skin overlying the left lateral lower at the was prepped and draped in the usual sterile fashion. The overlying soft tissues were anesthetized with 1% lidocaine with epinephrine. Appropriate trajectory was planned with the use of a 22 gauge spinal needle. An 18 gauge trocar needle was advanced into the abscess/fluid collection and a short Amplatz super stiff wire was coiled within the collection. Appropriate positioning was confirmed with a limited CT scan. The tract was serially dilated allowing placement of a 10 [place] all-purpose drainage catheter. Appropriate positioning was confirmed with a limited postprocedural CT scan. Approximately 600 Ml of serous fluid was aspirated. The tube was connected to a JP bulb and sutured in place. A dressing was placed. The patient tolerated the procedure well without immediate post procedural complication. IMPRESSION: Successful CT guided placement of a 10 French all purpose drain catheter into the indeterminate fluid collection within the ventral aspect of the abdominal/pelvic wall with aspiration of approximately 600 mL of serous fluid. Samples were sent to the laboratory as requested by the ordering clinical team. The etiology of  this fluid collection remains indeterminate with  differential considerations including seroma (favored), abscess and superinfected hematoma. Electronically Signed   By: Sandi Mariscal M.D.   On: 05/05/2016 15:08    Anti-infectives: Anti-infectives    Start     Dose/Rate Route Frequency Ordered Stop   05/04/16 0800  ciprofloxacin (CIPRO) IVPB 400 mg     400 mg 200 mL/hr over 60 Minutes Intravenous Every 12 hours 05/03/16 2313     05/04/16 0400  metroNIDAZOLE (FLAGYL) IVPB 500 mg     500 mg 100 mL/hr over 60 Minutes Intravenous Every 8 hours 05/03/16 2313     05/03/16 1930  ciprofloxacin (CIPRO) IVPB 400 mg     400 mg 200 mL/hr over 60 Minutes Intravenous  Once 05/03/16 1916 05/03/16 2139   05/03/16 1930  metroNIDAZOLE (FLAGYL) IVPB 500 mg     500 mg 100 mL/hr over 60 Minutes Intravenous  Once 05/03/16 1916 05/03/16 2139      Assessment/Plan:  Post op seroma, constipation  Cultures negative so far.  Will continue IV antibiotics for now Continue miralax Try mag citrate  LOS: 4 days    Lemario Chaikin A 05/07/2016

## 2016-05-07 NOTE — Discharge Instructions (Addendum)
Information on my medicine - XARELTO (rivaroxaban)  This medication education was reviewed with me or my healthcare representative as part of my discharge preparation.  The pharmacist that spoke with me during my hospital stay was:  Saundra Shelling, [place]? Xarelto was prescribed to treat blood clots that may have been found in the veins of your legs (deep vein thrombosis) or in your lungs (pulmonary embolism) and to reduce the risk of them occurring again.  What do you need to know about Xarelto? The dose is one 20 mg tablet taken ONCE A DAY with your evening meal.  DO NOT stop taking Xarelto without talking to the health care provider who prescribed the medication.  Refill your prescription for 20 mg tablets before you run out.  After discharge, you should have regular check-up appointments with your healthcare provider that is prescribing your Xarelto.  In the future your dose may need to be changed if your kidney function changes by a significant amount.  What do you do if you miss a dose? If you are taking Xarelto TWICE DAILY and you miss a dose, take it as soon as you remember. You may take two 15 mg tablets (total 30 mg) at the same time then resume your regularly scheduled 15 mg twice daily the next day.  If you are taking Xarelto ONCE DAILY and you miss a dose, take it as soon as you remember on the same day then continue your regularly scheduled once daily regimen the next day. Do not take two doses of Xarelto at the same time.   Important Safety Information Xarelto is a blood thinner medicine that can cause bleeding. You should call your healthcare provider right away if you experience any of the following: ? Bleeding from an injury or your nose that does not stop. ? Unusual colored urine (red or dark brown) or unusual colored stools (red or black). ? Unusual bruising for unknown reasons. ? A serious fall or if you hit your head (even if  there is no bleeding).  Some medicines may interact with Xarelto and might increase your risk of bleeding while on Xarelto. To help avoid this, consult your healthcare provider or pharmacist prior to using any new prescription or non-prescription medications, including herbals, vitamins, non-steroidal anti-inflammatory drugs (NSAIDs) and supplements.  This website has more information on Xarelto: [email or web address]   OK TO SHOWER NO LIFTING MORE THAN 15 POUNDS FOR 3 WEEKS CALL OFFICE FOR FOLLOW-UP APPOINTMENT CALL FOR FEVER GREATER THAN 101

## 2016-05-08 LAB — CULTURE, BLOOD (ROUTINE X 2)
CULTURE: NO GROWTH
Culture: NO GROWTH

## 2016-05-08 MED ORDER — CIPROFLOXACIN HCL 500 MG PO TABS
500.0000 mg | ORAL_TABLET | Freq: Two times a day (BID) | ORAL | Status: DC
  Administered 2016-05-08 – 2016-05-10 (×5): 500 mg via ORAL
  Filled 2016-05-08 (×5): qty 1

## 2016-05-08 MED ORDER — MAGNESIUM CITRATE PO SOLN
1.0000 | Freq: Once | ORAL | Status: AC
  Administered 2016-05-08: 1 via ORAL
  Filled 2016-05-08: qty 296

## 2016-05-08 MED ORDER — METRONIDAZOLE 500 MG PO TABS
500.0000 mg | ORAL_TABLET | Freq: Three times a day (TID) | ORAL | Status: DC
  Administered 2016-05-08 – 2016-05-09 (×3): 500 mg via ORAL
  Filled 2016-05-08 (×3): qty 1

## 2016-05-08 NOTE — Progress Notes (Signed)
  Subjective: No BM yet but passing flatus Still with some abdominal pain No fever for 48 hours  Objective: Vital signs in last 24 hours: Temp:  [98.4 F (36.9 C)-99.7 F (37.6 C)] 99.4 F (37.4 C) (09/01 0602) Pulse Rate:  [77-90] 77 (09/01 0602) Resp:  [18-20] 18 (09/01 0602) BP: (106-127)/(67-78) 119/77 (09/01 0602) SpO2:  [96 %-100 %] 96 % (09/01 0602) Last BM Date: 05/04/16  Intake/Output from previous day: 08/31 0701 - 09/01 0700 In: 1070 [P.O.:120; I.V.:550; IV Piggyback:400] Out: 10 [Drains:10] Intake/Output this shift: No intake/output data recorded.  Abdomen soft, obese, almost non-tender Drain remains serous  Lab Results:  No results for input(s): WBC, HGB, HCT, PLT in the last 72 hours. BMET No results for input(s): NA, K, CL, CO2, GLUCOSE, BUN, CREATININE, CALCIUM in the last 72 hours. PT/INR  Recent Labs  05/05/16 1028  LABPROT 17.1*  INR 1.38   ABG No results for input(s): PHART, HCO3 in the last 72 hours.  Invalid input(s): PCO2, PO2  Studies/Results: No results found.  Anti-infectives: Anti-infectives    Start     Dose/Rate Route Frequency Ordered Stop   05/04/16 0800  ciprofloxacin (CIPRO) IVPB 400 mg     400 mg 200 mL/hr over 60 Minutes Intravenous Every 12 hours 05/03/16 2313     05/04/16 0400  metroNIDAZOLE (FLAGYL) IVPB 500 mg     500 mg 100 mL/hr over 60 Minutes Intravenous Every 8 hours 05/03/16 2313     05/03/16 1930  ciprofloxacin (CIPRO) IVPB 400 mg     400 mg 200 mL/hr over 60 Minutes Intravenous  Once 05/03/16 1916 05/03/16 2139   05/03/16 1930  metroNIDAZOLE (FLAGYL) IVPB 500 mg     500 mg 100 mL/hr over 60 Minutes Intravenous  Once 05/03/16 1916 05/03/16 2139      Assessment/Plan: Fever and elevated WBC of uncertain etiology 3 weeks post op  Cultures on fluid negative for 2 days and gram stain showed no bacteria so doubt abscess and doubt infection of the mesh  Will repeat Mg Citrate today Change antibiotics to  po  LOS: 5 days    Makylah Bossard A 05/08/2016

## 2016-05-09 LAB — CBC
HEMATOCRIT: 30.1 % — AB (ref 36.0–46.0)
Hemoglobin: 9.6 g/dL — ABNORMAL LOW (ref 12.0–15.0)
MCH: 31 pg (ref 26.0–34.0)
MCHC: 31.9 g/dL (ref 30.0–36.0)
MCV: 97.1 fL (ref 78.0–100.0)
PLATELETS: 495 10*3/uL — AB (ref 150–400)
RBC: 3.1 MIL/uL — ABNORMAL LOW (ref 3.87–5.11)
RDW: 12.3 % (ref 11.5–15.5)
WBC: 9.6 10*3/uL (ref 4.0–10.5)

## 2016-05-09 MED ORDER — DOXYCYCLINE HYCLATE 100 MG PO TABS
100.0000 mg | ORAL_TABLET | Freq: Two times a day (BID) | ORAL | Status: DC
  Administered 2016-05-09 – 2016-05-10 (×3): 100 mg via ORAL
  Filled 2016-05-09 (×3): qty 1

## 2016-05-09 NOTE — Progress Notes (Signed)
  Subjective: Stable and alert.  Tolerating diet.  Had a bowel movement. Pain about the same. Afebrile. HR 83. Drainage low volume, nonpurulent.  Cultures growing gram-positive cocci in clusters.  Will adjust antibiotics. WBC 9600. Anticipating discharge tomorrow.  Objective: Vital signs in last 24 hours: Temp:  [98.9 F (37.2 C)-99.3 F (37.4 C)] 98.9 F (37.2 C) (09/02 0515) Pulse Rate:  [82-94] 83 (09/02 0515) Resp:  [16-17] 17 (09/02 0515) BP: (104-127)/(63-92) 104/63 (09/02 0515) SpO2:  [98 %-100 %] 100 % (09/02 0515) Last BM Date: 05/04/16  Intake/Output from previous day: 09/01 0701 - 09/02 0700 In: 1560 [P.O.:1160; I.V.:400] Out: 5 [Drains:5] Intake/Output this shift: No intake/output data recorded.  General appearance: Alert.  Sitting up in bed in joining breakfast.  No obvious distress. GI: Soft.  Obese.  A little bit tender centrally and upper incision.  Incision seems to be healing.  No drainage or cellulitis or necrosis.  Drainage low volume.  Nonpurulent.  Lab Results:  Results for orders placed or performed during the hospital encounter of 05/03/16 (from the past 24 hour(s))  CBC     Status: Abnormal   Collection Time: 05/09/16  3:41 AM  Result Value Ref Range   WBC 9.6 4.0 - 10.5 K/uL   RBC 3.10 (L) 3.87 - 5.11 MIL/uL   Hemoglobin 9.6 (L) 12.0 - 15.0 g/dL   HCT 30.1 (L) 36.0 - 46.0 %   MCV 97.1 78.0 - 100.0 fL   MCH 31.0 26.0 - 34.0 pg   MCHC 31.9 30.0 - 36.0 g/dL   RDW 12.3 11.5 - 15.5 %   Platelets 495 (H) 150 - 400 K/uL     Studies/Results: No results found.  . ciprofloxacin  500 mg Oral BID  . doxycycline  100 mg Oral Q12H  . levothyroxine  112 mcg Oral QAC breakfast  . lisinopril  5 mg Oral Daily  . metoprolol  50 mg Oral BID  . polyethylene glycol  17 g Oral Daily  . rivaroxaban  20 mg Oral q1800     Assessment/Plan:  Infected fluid collection around mesh.  Gram-positive cocci in clusters. Discontinue Flagyl Start  doxycycline CBC tomorrow. Consider ID consult Possible discharge tomorrow per Dr. [name].  POD #26.  Open repair incisional hernia with transverse abdominal muscle release, insertion of mesh  History pulmonary embolism.  On Xarelto.  @PROBHOSP @  LOS: 6 days    Alixander Rallis M 05/09/2016  . .prob

## 2016-05-10 LAB — CBC
HCT: 30.6 % — ABNORMAL LOW (ref 36.0–46.0)
HEMOGLOBIN: 9.7 g/dL — AB (ref 12.0–15.0)
MCH: 30.9 pg (ref 26.0–34.0)
MCHC: 31.7 g/dL (ref 30.0–36.0)
MCV: 97.5 fL (ref 78.0–100.0)
PLATELETS: 511 10*3/uL — AB (ref 150–400)
RBC: 3.14 MIL/uL — ABNORMAL LOW (ref 3.87–5.11)
RDW: 12.4 % (ref 11.5–15.5)
WBC: 9.3 10*3/uL (ref 4.0–10.5)

## 2016-05-10 MED ORDER — OXYCODONE-ACETAMINOPHEN 5-325 MG PO TABS: 1.0000 | ORAL_TABLET | ORAL | 0 refills | Status: DC | PRN

## 2016-05-10 MED ORDER — DOXYCYCLINE HYCLATE 100 MG PO TABS: 100.0000 mg | ORAL_TABLET | Freq: Two times a day (BID) | ORAL | 0 refills | Status: DC

## 2016-05-10 NOTE — Discharge Summary (Signed)
Physician Discharge Summary  Patient ID: [name] MRN: [id number] DOB/AGE: 11-Nov-1980 35 y.o.  Admit date: 05/03/2016 Discharge date: 05/10/2016  Admission Diagnoses:  Discharge Diagnoses:  Active Problems:   Post op infection POST OP SEROMA  Discharged Condition: good  Hospital Course: admitted with abdominal pain and fever 3 weeks post op.  CT showed intra-abd seroma vs abscess.  IR placed a perc drain.  Fluid more consistent with a seroma.  Cultures still pending.  Improved quickly.  Remained afebrile for 3 days.  Constipation resolved.  WBC was normal.  Day of discharge was non-tender with no abdominal pain  Consults: IR  Significant Diagnostic Studies:   Treatments: IV antibiotics and perc drain  Discharge Exam: Blood pressure 109/66, pulse 81, temperature 98.8 F (37.1 C), resp. rate 18, height 5\' 1"  (1.549 m), weight 98.2 kg (216 lb 8 oz), last menstrual period 04/13/2016, SpO2 99 %. General appearance: alert, cooperative and no distress Resp: clear to auscultation bilaterally Cardio: regular rate and rhythm, S1, S2 normal, no murmur, click, rub or gallop Incision/Wound: abdomen soft, non tender, incision clean Drain clear  Disposition: 01-Home or Self Care  Will remove drain in the office friday     Medication List    STOP taking these medications   cyclobenzaprine 10 MG tablet Commonly known as:  FLEXERIL     TAKE these medications   diphenhydrAMINE 25 MG tablet Commonly known as:  BENADRYL Take 25-50 mg by mouth every 6 (six) hours as needed. itching   doxycycline 100 MG tablet Commonly known as:  VIBRA-TABS Take 1 tablet (100 mg total) by mouth 2 (two) times daily.   levothyroxine 112 MCG tablet Commonly known as:  SYNTHROID, LEVOTHROID Take 112 mcg by mouth daily.   lisinopril 5 MG tablet Commonly known as:  PRINIVIL,ZESTRIL Take 5 mg by mouth daily.   metoprolol 50 MG tablet Commonly known as:  LOPRESSOR Take 50 mg by mouth 2 (two)  times daily.   oxyCODONE-acetaminophen 5-325 MG tablet Commonly known as:  PERCOCET/ROXICET Take 1-2 tablets by mouth every 4 (four) hours as needed for moderate pain.   promethazine 12.5 MG tablet Commonly known as:  PHENERGAN Take 1 tablet (12.5 mg total) by mouth every 6 (six) hours as needed for nausea or vomiting.   XARELTO 20 MG Tabs tablet Generic drug:  rivaroxaban Take 20 mg by mouth every evening.      Follow-up Information    Iza Preston A, MD. Schedule an appointment as soon as possible for a visit on 05/15/2016.   Specialty:  General Surgery Why:  for drain removal ask for [facility] information: [address] [address] [phone]           Signed: Harl Bowie 05/10/2016, 7:52 AM

## 2016-05-10 NOTE — Care Management Note (Signed)
Case Management Note  Patient Details  Name: [name] MRN: [id number] Date of Birth: December 08, 1980  Subjective/Objective:                    Action/Plan: Anticipate discharge home today. No further CM needs but will be available should additional discharge needs arise.   Expected Discharge Date:                  Expected Discharge Plan:  Home/Self Care  In-House Referral:  NA  Discharge planning Services  NA  Post Acute Care Choice:  NA Choice offered to:  NA  DME Arranged:  N/A DME Agency:  NA  HH Arranged:  NA HH Agency:     Status of Service:  Completed, signed off  If discussed at [place] of Stay Meetings, dates discussed:    Additional Comments:  Delrae Sawyers, RN 05/10/2016, 9:19 AM

## 2016-05-10 NOTE — Progress Notes (Signed)
Patient discharged to home with instructions, verbalized understanding. 

## 2016-05-10 NOTE — Progress Notes (Signed)
Patient ID: [name], female   DOB: [birth year], 35 y.o.   MRN: [id number]   Doing well Moving her bowels Denies pain Abdomen soft Drain fluid clear  Plan: Discharge on oral antibiotics

## 2016-05-11 LAB — CULTURE, BODY FLUID W GRAM STAIN -BOTTLE

## 2016-05-11 LAB — CULTURE, BODY FLUID-BOTTLE

## 2016-09-19 ENCOUNTER — Emergency Department (HOSPITAL_COMMUNITY)
Admission: EM | Admit: 2016-09-19 | Discharge: 2016-09-19 | Disposition: A | Discharge status: Home / Self Care | Payer: 59 | Attending: Emergency Medicine | Admitting: Emergency Medicine

## 2016-09-19 ENCOUNTER — Encounter (HOSPITAL_COMMUNITY): Payer: Self-pay | Admitting: Emergency Medicine

## 2016-09-19 ENCOUNTER — Emergency Department (HOSPITAL_COMMUNITY): Payer: 59

## 2016-09-19 DIAGNOSIS — Z79899 Other long term (current) drug therapy: Secondary | ICD-10-CM | POA: Insufficient documentation

## 2016-09-19 DIAGNOSIS — R109 Unspecified abdominal pain: Secondary | ICD-10-CM

## 2016-09-19 DIAGNOSIS — K5900 Constipation, unspecified: Secondary | ICD-10-CM | POA: Insufficient documentation

## 2016-09-19 DIAGNOSIS — I10 Essential (primary) hypertension: Secondary | ICD-10-CM | POA: Insufficient documentation

## 2016-09-19 DIAGNOSIS — K529 Noninfective gastroenteritis and colitis, unspecified: Secondary | ICD-10-CM | POA: Insufficient documentation

## 2016-09-19 DIAGNOSIS — E039 Hypothyroidism, unspecified: Secondary | ICD-10-CM | POA: Insufficient documentation

## 2016-09-19 DIAGNOSIS — R1012 Left upper quadrant pain: Secondary | ICD-10-CM | POA: Diagnosis present

## 2016-09-19 DIAGNOSIS — Z7901 Long term (current) use of anticoagulants: Secondary | ICD-10-CM | POA: Insufficient documentation

## 2016-09-19 LAB — COMPREHENSIVE METABOLIC PANEL
ALK PHOS: 85 U/L (ref 38–126)
ALT: 15 U/L (ref 14–54)
AST: 23 U/L (ref 15–41)
Albumin: 3.7 g/dL (ref 3.5–5.0)
Anion gap: 9 (ref 5–15)
BUN: 12 mg/dL (ref 6–20)
CALCIUM: 9 mg/dL (ref 8.9–10.3)
CO2: 25 mmol/L (ref 22–32)
CREATININE: 1.13 mg/dL — AB (ref 0.44–1.00)
Chloride: 102 mmol/L (ref 101–111)
GFR calc non Af Amer: >60 mL/min (ref >60)
Glucose, Bld: 121 mg/dL — ABNORMAL HIGH (ref 65–99)
Potassium: 3.7 mmol/L (ref 3.5–5.1)
Sodium: 136 mmol/L (ref 135–145)
Total Bilirubin: 0.8 mg/dL (ref 0.3–1.2)
Total Protein: 7.5 g/dL (ref 6.5–8.1)

## 2016-09-19 LAB — URINALYSIS, ROUTINE W REFLEX MICROSCOPIC
Bacteria, UA: NONE SEEN
Bilirubin Urine: NEGATIVE
GLUCOSE, UA: NEGATIVE mg/dL
Hgb urine dipstick: NEGATIVE
KETONES UR: NEGATIVE mg/dL
LEUKOCYTES UA: NEGATIVE
Nitrite: NEGATIVE
PH: 8 (ref 5.0–8.0)
Protein, ur: 30 mg/dL — AB
Specific Gravity, Urine: 1.02 (ref 1.005–1.030)

## 2016-09-19 LAB — CBC
HCT: 39 % (ref 36.0–46.0)
Hemoglobin: 13.8 g/dL (ref 12.0–15.0)
MCH: 32.9 pg (ref 26.0–34.0)
MCHC: 35.4 g/dL (ref 30.0–36.0)
MCV: 93.1 fL (ref 78.0–100.0)
PLATELETS: 386 10*3/uL (ref 150–400)
RBC: 4.19 MIL/uL (ref 3.87–5.11)
RDW: 12.6 % (ref 11.5–15.5)
WBC: 9.5 10*3/uL (ref 4.0–10.5)

## 2016-09-19 LAB — LIPASE, BLOOD: Lipase: 19 U/L (ref 11–51)

## 2016-09-19 LAB — POC URINE PREG, ED: Preg Test, Ur: NEGATIVE

## 2016-09-19 MED ORDER — DOCUSATE SODIUM 100 MG PO CAPS: 100.0000 mg | ORAL_CAPSULE | Freq: Two times a day (BID) | ORAL | 0 refills | Status: DC

## 2016-09-19 MED ORDER — MORPHINE SULFATE (PF) 4 MG/ML IV SOLN
4.0000 mg | Freq: Once | INTRAVENOUS | Status: AC
  Administered 2016-09-19: 4 mg via INTRAVENOUS
  Filled 2016-09-19: qty 1

## 2016-09-19 MED ORDER — ONDANSETRON HCL 4 MG PO TABS: 4.0000 mg | ORAL_TABLET | Freq: Four times a day (QID) | ORAL | 0 refills | Status: DC

## 2016-09-19 MED ORDER — ONDANSETRON 4 MG PO TBDP
8.0000 mg | ORAL_TABLET | Freq: Once | ORAL | Status: AC | PRN
  Administered 2016-09-19: 8 mg via ORAL

## 2016-09-19 MED ORDER — ONDANSETRON 4 MG PO TBDP
ORAL_TABLET | ORAL | Status: AC
  Filled 2016-09-19: qty 2

## 2016-09-19 MED ORDER — SODIUM CHLORIDE 0.9 % IV BOLUS (SEPSIS)
1000.0000 mL | Freq: Once | INTRAVENOUS | Status: AC
  Administered 2016-09-19: 1000 mL via INTRAVENOUS

## 2016-09-19 MED ORDER — DICYCLOMINE HCL 20 MG PO TABS: 20.0000 mg | ORAL_TABLET | Freq: Two times a day (BID) | ORAL | 0 refills | Status: DC

## 2016-09-19 MED ORDER — ONDANSETRON HCL 4 MG/2ML IJ SOLN
4.0000 mg | Freq: Once | INTRAMUSCULAR | Status: AC
  Administered 2016-09-19: 4 mg via INTRAVENOUS
  Filled 2016-09-19: qty 2

## 2016-09-19 NOTE — ED Triage Notes (Signed)
Patient with abdominal pain, nausea and vomiting.  Patient states that it started yesterday, and woke up this morning with pain, vomiting and pain radiating to her back.

## 2016-09-19 NOTE — ED Provider Notes (Signed)
[facility] DEPT Provider Note   CSN: [id number] Arrival date & time: 09/19/16  0355   History   Chief Complaint Chief Complaint  Patient presents with  . Abdominal Pain    HPI [name] is a 36 y.o. female.  HPI   Pt has PMH of obesity, incisional hernia repair with complication of infection of this hernia, pilonidal cyst excision, graves disease, and hypertension comes to the ER with complaints of abdominal pain that started on Thursday and has been intermittent since. She says that she feels it in the right and left upper quadrants as well as her epigastric region and it radiates into her back. She has had associated vomiting. The patient looks comfortable but rates her pain at a 8/10.   No CP, diarrhea, SOB, abnormal bleeding, vaginal discharge, headaches, fever, lower abdominal pain,   Past Medical History:  Diagnosis Date  . Complication of anesthesia    "difficulty waking up and will fight when woken up"  . Gallstones   . Graves disease   . Hernia, ventral   . History of blood clots   . History of pulmonary embolus (PE)   . Hypertension   . Hypothyroid   . Shortness of breath dyspnea    Patient Active Problem List   Diagnosis Date Noted  . Post op infection 05/03/2016  . Incisional hernia 04/13/2016    Past Surgical History:  Procedure Laterality Date  . BREAST SURGERY     cyst removed from right  . CESAREAN SECTION    . INCISIONAL HERNIA REPAIR  04/13/2016  . INCISIONAL HERNIA REPAIR N/A 04/13/2016   Procedure: INCISIONAL HERNIA REPAIR;  Surgeon: Coralie Keens, MD;  Location: [facility];  Service: General;  Laterality: N/A;  . INSERTION OF MESH N/A 04/13/2016   Procedure: INSERTION OF MESH;  Surgeon: Coralie Keens, MD;  Location: [facility];  Service: General;  Laterality: N/A;  . PILONIDAL CYST / SINUS EXCISION    . removal of sweat gland Bilateral    arms    OB History    No data available       Home Medications    Prior to Admission  medications   Medication Sig Start Date End Date Taking? Authorizing Provider  levothyroxine (SYNTHROID, LEVOTHROID) 125 MCG tablet Take 125 mcg by mouth daily before breakfast.   Yes Historical Provider, MD  lisinopril (PRINIVIL,ZESTRIL) 5 MG tablet Take 5 mg by mouth daily. 11/11/15 11/10/16 Yes Historical Provider, MD  metoprolol (LOPRESSOR) 50 MG tablet Take 50 mg by mouth 2 (two) times daily. 09/12/15  Yes Historical Provider, MD  Rivaroxaban (XARELTO) 20 MG TABS Take 20 mg by mouth every evening.    Yes Historical Provider, MD  dicyclomine (BENTYL) 20 MG tablet Take 1 tablet (20 mg total) by mouth 2 (two) times daily. 09/19/16   Ansen Sayegh Carlota Raspberry, PA-C  docusate sodium (COLACE) 100 MG capsule Take 1 capsule (100 mg total) by mouth every 12 (twelve) hours. 09/19/16   Chloe Baig Carlota Raspberry, PA-C  doxycycline (VIBRA-TABS) 100 MG tablet Take 1 tablet (100 mg total) by mouth 2 (two) times daily. Patient not taking: Reported on 09/19/2016 05/10/16   Coralie Keens, MD  ondansetron (ZOFRAN) 4 MG tablet Take 1 tablet (4 mg total) by mouth every 6 (six) hours. 09/19/16   Delos Haring, PA-C  oxyCODONE-acetaminophen (PERCOCET/ROXICET) 5-325 MG tablet Take 1-2 tablets by mouth every 4 (four) hours as needed for moderate pain. Patient not taking: Reported on 09/19/2016 05/10/16   Coralie Keens, MD  promethazine (PHENERGAN) 12.5 MG tablet Take 1 tablet (12.5 mg total) by mouth every 6 (six) hours as needed for nausea or vomiting. Patient not taking: Reported on 09/19/2016 04/22/16   Coralie Keens, MD    Family History Family History  Problem Relation Age of Onset  . Hypertension Other   . Hyperlipidemia Other     Social History Social History  Substance Use Topics  . Smoking status: Never Smoker  . Smokeless tobacco: Never Used  . Alcohol use Yes     Comment: occasionally     Allergies   Penicillins   Review of Systems Review of Systems Review of Systems All other systems negative except as documented  in the HPI. All pertinent positives and negatives as reviewed in the HPI.   Physical Exam Updated Vital Signs BP 149/99 (BP Location: Right Arm)   Pulse 82   Temp 98.8 F (37.1 C) (Oral)   Resp 16   LMP 09/05/2016 (Approximate)   SpO2 100%   Physical Exam  Constitutional: She appears well-developed and well-nourished. No distress.  HENT:  Head: Normocephalic and atraumatic.  Nose: Nose normal.  Mouth/Throat: Uvula is midline, oropharynx is clear and moist and mucous membranes are normal.  Eyes: Pupils are equal, round, and reactive to light.  Neck: Normal range of motion. Neck supple.  Cardiovascular: Normal rate and regular rhythm.   Pulmonary/Chest: Effort normal and breath sounds normal. No tachypnea. No respiratory distress.  Abdominal: Soft. Bowel sounds are normal. There is tenderness in the right upper quadrant, epigastric area and left upper quadrant. There is no rigidity, no rebound, no guarding, no CVA tenderness and negative Murphy's sign.  Exam limited by body habitus.  Musculoskeletal:  No LE swelling  Neurological: She is alert.  Acting at baseline  Skin: Skin is warm and dry. No rash noted.  Nursing note and vitals reviewed.  ED Treatments / Results  Labs (all labs ordered are listed, but only abnormal results are displayed) Labs Reviewed  COMPREHENSIVE METABOLIC PANEL - Abnormal; Notable for the following:       Result Value   Glucose, Bld 121 (*)    Creatinine, Ser 1.13 (*)    All other components within normal limits  URINALYSIS, ROUTINE W REFLEX MICROSCOPIC - Abnormal; Notable for the following:    APPearance HAZY (*)    Protein, ur 30 (*)    Squamous Epithelial / LPF 0-5 (*)    All other components within normal limits  LIPASE, BLOOD  CBC  POC URINE PREG, ED    EKG  EKG Interpretation None       Radiology US Abdomen Limited  Result Date: 09/19/2016 CLINICAL DATA:  History of gallstones.  Epigastric pain. EXAM: US ABDOMEN LIMITED - RIGHT  UPPER QUADRANT COMPARISON:  None. FINDINGS: Gallbladder: Multiple layering stones are seen in the gallbladder. No wall thickening, pericholecystic fluid, or Murphy's spine. Common bile duct: Diameter: 4.4 mm Liver: No focal lesion identified. Within normal limits in parenchymal echogenicity. IMPRESSION: 1. Cholelithiasis. No wall thickening, pericholecystic fluid, or Murphy's sign. Electronically Signed   By: Dorise Bullion III M.D   On: 09/19/2016 08:06   Dg Abd 2 Views  Result Date: 09/19/2016 CLINICAL DATA:  Pain and pressure in the upper abdomen radiating to the back. Vomiting. EXAM: ABDOMEN - 2 VIEW COMPARISON:  Abdomen pelvis CT dated 05/03/2016. FINDINGS: Normal bowel gas pattern without free peritoneal air. Stool throughout the majority of the colon. Minimal lumbar spine degenerative changes. IMPRESSION: No acute abnormality.  Moderate amount of stool. Electronically Signed   By: Claudie Revering M.D.   On: 09/19/2016 07:35    Procedures Procedures (including critical care time)  Medications Ordered in ED Medications  sodium chloride 0.9 % bolus 1,000 mL (1,000 mLs Intravenous New Bag/Given 09/19/16 0731)  ondansetron (ZOFRAN-ODT) disintegrating tablet 8 mg (8 mg Oral Given 09/19/16 0412)  ondansetron (ZOFRAN) injection 4 mg (4 mg Intravenous Given 09/19/16 0729)  morphine 4 MG/ML injection 4 mg (4 mg Intravenous Given 09/19/16 0730)     Initial Impression / Assessment and Plan / ED Course  I have reviewed the triage vital signs and the nursing notes.  Pertinent labs & imaging results that were available during my care of the patient were reviewed by me and considered in my medical decision making (see chart for details).  Clinical Course     Patient is nontoxic, nonseptic appearing, in no apparent distress.  Patient's pain and other symptoms adequately managed in emergency department.  Fluid bolus given.  Labs, imaging and vitals reviewed.  Patient does not meet the SIRS or Sepsis  criteria.  On repeat exam patient does not have a surgical abdomin and there are no peritoneal signs.  No indication of appendicitis, bowel obstruction, bowel perforation, cholecystitis, diverticulitis, PID or ectopic pregnancy.  Patient discharged home with symptomatic treatment and given strict instructions for follow-up with their primary care physician.  I have also discussed reasons to return immediately to the ER.  Patient expresses understanding and agrees with plan.     Final Clinical Impressions(s) / ED Diagnoses   Final diagnoses:  Abdominal pain  Constipation, unspecified constipation type  Gastroenteritis    New Prescriptions New Prescriptions   DICYCLOMINE (BENTYL) 20 MG TABLET    Take 1 tablet (20 mg total) by mouth 2 (two) times daily.   DOCUSATE SODIUM (COLACE) 100 MG CAPSULE    Take 1 capsule (100 mg total) by mouth every 12 (twelve) hours.   ONDANSETRON (ZOFRAN) 4 MG TABLET    Take 1 tablet (4 mg total) by mouth every 6 (six) hours.     Delos Haring, PA-C 09/19/16 [place], DO 09/22/16 2329

## 2016-09-19 NOTE — ED Notes (Signed)
Patient d/c'd from IV, continuous pulse oximetry and blood pressure cuff; patient will be wearing gown home because she stated when she got here she vomited on her own clothes; visitor at bedside

## 2016-09-19 NOTE — ED Notes (Signed)
Patient transported to X-ray 

## 2016-11-11 ENCOUNTER — Emergency Department (HOSPITAL_COMMUNITY)
Admission: EM | Admit: 2016-11-11 | Discharge: 2016-11-11 | Disposition: A | Discharge status: Home / Self Care | Payer: 59 | Attending: Emergency Medicine | Admitting: Emergency Medicine

## 2016-11-11 ENCOUNTER — Encounter (HOSPITAL_COMMUNITY): Payer: Self-pay | Admitting: Emergency Medicine

## 2016-11-11 DIAGNOSIS — Z7901 Long term (current) use of anticoagulants: Secondary | ICD-10-CM | POA: Diagnosis not present

## 2016-11-11 DIAGNOSIS — I10 Essential (primary) hypertension: Secondary | ICD-10-CM | POA: Diagnosis not present

## 2016-11-11 DIAGNOSIS — T7840XA Allergy, unspecified, initial encounter: Secondary | ICD-10-CM | POA: Insufficient documentation

## 2016-11-11 DIAGNOSIS — E039 Hypothyroidism, unspecified: Secondary | ICD-10-CM | POA: Insufficient documentation

## 2016-11-11 DIAGNOSIS — R220 Localized swelling, mass and lump, head: Secondary | ICD-10-CM | POA: Diagnosis present

## 2016-11-11 DIAGNOSIS — Z79899 Other long term (current) drug therapy: Secondary | ICD-10-CM | POA: Insufficient documentation

## 2016-11-11 MED ORDER — FAMOTIDINE 20 MG PO TABS: 20.0000 mg | ORAL_TABLET | Freq: Every day | ORAL | 0 refills | Status: DC

## 2016-11-11 MED ORDER — FAMOTIDINE 20 MG PO TABS
20.0000 mg | ORAL_TABLET | Freq: Once | ORAL | Status: AC
  Administered 2016-11-11: 20 mg via ORAL
  Filled 2016-11-11: qty 1

## 2016-11-11 NOTE — Discharge Instructions (Signed)
Continue to take Benadryl and Pepcid as needed.  Return to the ER if your symptoms worsen.

## 2016-11-11 NOTE — ED Triage Notes (Signed)
Pt had teeth cleaning today at 4pm; at around 5pm pt noticed that her eyes were extremely swollen; pt states that her eyes were "swollen shut"; pt states the swelling has decreased since; eyes noted to be slightly swollen on underside; pt able to open/close eyes with ease; no redness or drainage noted; pt also states she had hives on the right side of her face; denies oral swelling; denies chest tightness; denies difficulty breathing

## 2016-11-11 NOTE — ED Provider Notes (Signed)
[facility] DEPT Provider Note   CSN: [id number] Arrival date & time: 11/11/16  2017   By signing my name below, I, [name], attest that this documentation has been prepared under the direction and in the presence of  Montine Circle, PA-C. Electronically Signed: Delton [name], ED Scribe. 11/11/16. 8:56 PM.   History   Chief Complaint Chief Complaint  Patient presents with  . Facial Swelling   The history is provided by the patient. No language interpreter was used.   HPI Comments:  [name] is a 36 y.o. female who presents to the Emergency Department complaining of acute onset, gradually improving bilateral eye swelling onset 5 PM today. She also reports hives to her right side of her face. Pt states she has her teeth cleaned at 4 PM today and noticed her symptoms 1 hour later. She has taken Benadryl with relief. Pt denies SOB or any other associated symptoms. No other complaints noted.   Past Medical History:  Diagnosis Date  . Complication of anesthesia    "difficulty waking up and will fight when woken up"  . Gallstones   . Graves disease   . Hernia, ventral   . History of blood clots   . History of pulmonary embolus (PE)   . Hypertension   . Hypothyroid   . Shortness of breath dyspnea     Patient Active Problem List   Diagnosis Date Noted  . Post op infection 05/03/2016  . Incisional hernia 04/13/2016    Past Surgical History:  Procedure Laterality Date  . BREAST SURGERY     cyst removed from right  . CESAREAN SECTION    . INCISIONAL HERNIA REPAIR  04/13/2016  . INCISIONAL HERNIA REPAIR N/A 04/13/2016   Procedure: INCISIONAL HERNIA REPAIR;  Surgeon: Lauren Keens, MD;  Location: [place];  Service: General;  Laterality: N/A;  . INSERTION OF MESH N/A 04/13/2016   Procedure: INSERTION OF MESH;  Surgeon: Lauren Keens, MD;  Location: [place];  Service: General;  Laterality: N/A;  . PILONIDAL CYST / SINUS EXCISION    . removal of sweat gland Bilateral    arms    OB History    No data available       Home Medications    Prior to Admission medications   Medication Sig Start Date End Date Taking? Authorizing Provider  dicyclomine (BENTYL) 20 MG tablet Take 1 tablet (20 mg total) by mouth 2 (two) times daily. 09/19/16   Lauren Carlota Raspberry, PA-C  docusate sodium (COLACE) 100 MG capsule Take 1 capsule (100 mg total) by mouth every 12 (twelve) hours. 09/19/16   Lauren Carlota Raspberry, PA-C  doxycycline (VIBRA-TABS) 100 MG tablet Take 1 tablet (100 mg total) by mouth 2 (two) times daily. Patient not taking: Reported on 09/19/2016 05/10/16   Lauren Keens, MD  levothyroxine (SYNTHROID, LEVOTHROID) 125 MCG tablet Take 125 mcg by mouth daily before breakfast.    Historical Provider, MD  lisinopril (PRINIVIL,ZESTRIL) 5 MG tablet Take 5 mg by mouth daily. 11/11/15 11/10/16  Historical Provider, MD  metoprolol (LOPRESSOR) 50 MG tablet Take 50 mg by mouth 2 (two) times daily. 09/12/15   Historical Provider, MD  ondansetron (ZOFRAN) 4 MG tablet Take 1 tablet (4 mg total) by mouth every 6 (six) hours. 09/19/16   Lauren Haring, PA-C  oxyCODONE-acetaminophen (PERCOCET/ROXICET) 5-325 MG tablet Take 1-2 tablets by mouth every 4 (four) hours as needed for moderate pain. Patient not taking: Reported on 09/19/2016 05/10/16   Lauren Keens, MD  promethazine (  PHENERGAN) 12.5 MG tablet Take 1 tablet (12.5 mg total) by mouth every 6 (six) hours as needed for nausea or vomiting. Patient not taking: Reported on 09/19/2016 04/22/16   Lauren Keens, MD  Rivaroxaban (XARELTO) 20 MG TABS Take 20 mg by mouth every evening.     Historical Provider, MD    Family History Family History  Problem Relation Age of Onset  . Hypertension Other   . Hyperlipidemia Other     Social History Social History  Substance Use Topics  . Smoking status: Never Smoker  . Smokeless tobacco: Never Used  . Alcohol use Yes     Comment: occasionally     Allergies   Penicillins   Review of  Systems Review of Systems  HENT: Positive for facial swelling.   Respiratory: Negative for shortness of breath.    Physical Exam Updated Vital Signs BP 152/99 (BP Location: Left Arm)   Pulse 74   Temp 97.8 F (36.6 C) (Oral)   Resp 18   LMP 11/01/2016 (Approximate)   SpO2 97%   Physical Exam  Constitutional: She is oriented to person, place, and time. She appears well-developed and well-nourished. No distress.  HENT:  Head: Normocephalic and atraumatic.  No visible swelling of the eyelids or face.  Airway intact, no stridor, no tongue swelling, no TTP of sublingual space.   Eyes: Conjunctivae and EOM are normal.  Normal EOMs  Cardiovascular: Normal rate, regular rhythm and normal heart sounds.   Pulmonary/Chest: Effort normal and breath sounds normal. No stridor. No respiratory distress. She has no wheezes. She has no rales. She exhibits no tenderness.  Abdominal: She exhibits no distension.  Neurological: She is alert and oriented to person, place, and time.  Skin: Skin is warm and dry.  Psychiatric: She has a normal mood and affect.  Nursing note and vitals reviewed.    ED Treatments / Results  DIAGNOSTIC STUDIES:  Oxygen Saturation is 97% on RA, normal by my interpretation.    COORDINATION OF CARE:  8:55 PM Discussed treatment plan with pt at bedside and pt agreed to plan.  Labs (all labs ordered are listed, but only abnormal results are displayed) Labs Reviewed - No data to display  EKG  EKG Interpretation None       Radiology No results found.  Procedures Procedures (including critical care time)  Medications Ordered in ED Medications - No data to display   Initial Impression / Assessment and Plan / ED Course  I have reviewed the triage vital signs and the nursing notes.  Pertinent labs & imaging results that were available during my care of the patient were reviewed by me and considered in my medical decision making (see chart for details).      Patient with allergic reaction.  Resolved now.  Continue benadryl as needed.  NAD.  Final Clinical Impressions(s) / ED Diagnoses   Final diagnoses:  Allergic reaction, initial encounter    New Prescriptions Discharge Medication List as of 11/11/2016  9:03 PM    START taking these medications   Details  famotidine (PEPCID) 20 MG tablet Take 1 tablet (20 mg total) by mouth daily., Starting Wed 11/11/2016, Print      I personally performed the services described in this documentation, which was scribed in my presence. The recorded information has been reviewed and is accurate.      Montine Circle, PA-C 11/11/16 0240    Gwenyth Allegra Tegeler, MD 11/12/16 1215

## 2016-12-18 ENCOUNTER — Encounter (HOSPITAL_COMMUNITY): Payer: Self-pay | Admitting: Emergency Medicine

## 2016-12-18 ENCOUNTER — Emergency Department (HOSPITAL_COMMUNITY)
Admission: EM | Admit: 2016-12-18 | Discharge: 2016-12-19 | Disposition: A | Discharge status: Home / Self Care | Payer: 59 | Attending: Emergency Medicine | Admitting: Emergency Medicine

## 2016-12-18 DIAGNOSIS — I10 Essential (primary) hypertension: Secondary | ICD-10-CM | POA: Diagnosis not present

## 2016-12-18 DIAGNOSIS — R112 Nausea with vomiting, unspecified: Secondary | ICD-10-CM

## 2016-12-18 DIAGNOSIS — Z79899 Other long term (current) drug therapy: Secondary | ICD-10-CM | POA: Insufficient documentation

## 2016-12-18 DIAGNOSIS — R197 Diarrhea, unspecified: Secondary | ICD-10-CM

## 2016-12-18 DIAGNOSIS — K8020 Calculus of gallbladder without cholecystitis without obstruction: Secondary | ICD-10-CM

## 2016-12-18 DIAGNOSIS — Z7901 Long term (current) use of anticoagulants: Secondary | ICD-10-CM | POA: Insufficient documentation

## 2016-12-18 DIAGNOSIS — E039 Hypothyroidism, unspecified: Secondary | ICD-10-CM | POA: Diagnosis not present

## 2016-12-18 DIAGNOSIS — R109 Unspecified abdominal pain: Secondary | ICD-10-CM | POA: Diagnosis present

## 2016-12-18 LAB — URINALYSIS, ROUTINE W REFLEX MICROSCOPIC
Bilirubin Urine: NEGATIVE
GLUCOSE, UA: NEGATIVE mg/dL
Hgb urine dipstick: NEGATIVE
Ketones, ur: 5 mg/dL — AB
Leukocytes, UA: NEGATIVE
Nitrite: NEGATIVE
Protein, ur: NEGATIVE mg/dL
SPECIFIC GRAVITY, URINE: 1.031 — AB (ref 1.005–1.030)
pH: 5 (ref 5.0–8.0)

## 2016-12-18 LAB — COMPREHENSIVE METABOLIC PANEL
ALT: 16 U/L (ref 14–54)
AST: 19 U/L (ref 15–41)
Albumin: 4 g/dL (ref 3.5–5.0)
Alkaline Phosphatase: 74 U/L (ref 38–126)
Anion gap: 8 (ref 5–15)
BUN: 10 mg/dL (ref 6–20)
CHLORIDE: 105 mmol/L (ref 101–111)
CO2: 24 mmol/L (ref 22–32)
CREATININE: 0.75 mg/dL (ref 0.44–1.00)
Calcium: 9.2 mg/dL (ref 8.9–10.3)
GFR calc non Af Amer: >60 mL/min (ref >60)
Glucose, Bld: 82 mg/dL (ref 65–99)
Potassium: 4.3 mmol/L (ref 3.5–5.1)
SODIUM: 137 mmol/L (ref 135–145)
Total Bilirubin: 0.8 mg/dL (ref 0.3–1.2)
Total Protein: 7.4 g/dL (ref 6.5–8.1)

## 2016-12-18 LAB — POC URINE PREG, ED: Preg Test, Ur: NEGATIVE

## 2016-12-18 LAB — CBC
HCT: 38.8 % (ref 36.0–46.0)
Hemoglobin: 13.4 g/dL (ref 12.0–15.0)
MCH: 32.4 pg (ref 26.0–34.0)
MCHC: 34.5 g/dL (ref 30.0–36.0)
MCV: 93.9 fL (ref 78.0–100.0)
PLATELETS: 338 10*3/uL (ref 150–400)
RBC: 4.13 MIL/uL (ref 3.87–5.11)
RDW: 12 % (ref 11.5–15.5)
WBC: 7.9 10*3/uL (ref 4.0–10.5)

## 2016-12-18 LAB — LIPASE, BLOOD: LIPASE: 13 U/L (ref 11–51)

## 2016-12-18 MED ORDER — ONDANSETRON 4 MG PO TBDP
4.0000 mg | ORAL_TABLET | Freq: Once | ORAL | Status: AC | PRN
  Administered 2016-12-18: 4 mg via ORAL

## 2016-12-18 MED ORDER — ONDANSETRON 4 MG PO TBDP
ORAL_TABLET | ORAL | Status: AC
  Filled 2016-12-18: qty 1

## 2016-12-18 NOTE — ED Notes (Signed)
Pt reports having abd pains now for 2 months. Pt is being seen by her PCP for this. Pt reports having an appointment on Monday with [facility].

## 2016-12-18 NOTE — ED Triage Notes (Signed)
Patient with history of gallstones, having abdominal pain and nausea and vomiting.  She states she is suppose to go see her surgeon to see when she is to have it out soon.  She states she has not been able to keep anything down today.

## 2016-12-19 ENCOUNTER — Emergency Department (HOSPITAL_COMMUNITY): Payer: 59

## 2016-12-19 MED ORDER — ONDANSETRON HCL 4 MG/2ML IJ SOLN
4.0000 mg | Freq: Once | INTRAMUSCULAR | Status: AC
  Administered 2016-12-19: 4 mg via INTRAVENOUS
  Filled 2016-12-19: qty 2

## 2016-12-19 MED ORDER — HYDROCODONE-ACETAMINOPHEN 5-325 MG PO TABS: 2.0000 | ORAL_TABLET | Freq: Four times a day (QID) | ORAL | 0 refills | Status: DC | PRN

## 2016-12-19 MED ORDER — SODIUM CHLORIDE 0.9 % IV SOLN
Freq: Once | INTRAVENOUS | Status: AC
  Administered 2016-12-19: 02:00:00 via INTRAVENOUS

## 2016-12-19 MED ORDER — MORPHINE SULFATE (PF) 4 MG/ML IV SOLN
4.0000 mg | Freq: Once | INTRAVENOUS | Status: AC
Start: 2016-12-19 — End: 2016-12-19
  Administered 2016-12-19: 4 mg via INTRAVENOUS
  Filled 2016-12-19: qty 1

## 2016-12-19 MED ORDER — ONDANSETRON 4 MG PO TBDP: 4.0000 mg | ORAL_TABLET | Freq: Three times a day (TID) | ORAL | 0 refills | Status: DC | PRN

## 2016-12-19 NOTE — Discharge Instructions (Signed)
You've been given a prescription for pain control as well as nausea control take these as needed he will also be given dietary guidelines to help with food choices while you're awaiting her appointment on Monday with [facility].

## 2016-12-19 NOTE — ED Notes (Signed)
Patient transported to Ultrasound 

## 2016-12-19 NOTE — ED Provider Notes (Signed)
[facility] DEPT Provider Note   CSN: [id number] Arrival date & time: 12/18/16  1923     History   Chief Complaint Chief Complaint  Patient presents with  . Abdominal Pain    HPI [name] is a 36 y.o. female.  This a morbidly obese 36 year old female with a history of gallstones diagnosed in January 2018, who has an appointment with central surgery for evaluation on Monday presents with increased abdominal pain, nausea.  She states she's been unable to eat anything for the past 2 days.  She states she has not been given any medicines nor antiemetics by her primary care physician.      Past Medical History:  Diagnosis Date  . Complication of anesthesia    "difficulty waking up and will fight when woken up"  . Gallstones   . Graves disease   . Hernia, ventral   . History of blood clots   . History of pulmonary embolus (PE)   . Hypertension   . Hypothyroid   . Shortness of breath dyspnea     Patient Active Problem List   Diagnosis Date Noted  . Post op infection 05/03/2016  . Incisional hernia 04/13/2016    Past Surgical History:  Procedure Laterality Date  . BREAST SURGERY     cyst removed from right  . CESAREAN SECTION    . INCISIONAL HERNIA REPAIR  04/13/2016  . INCISIONAL HERNIA REPAIR N/A 04/13/2016   Procedure: INCISIONAL HERNIA REPAIR;  Surgeon: Coralie Keens, MD;  Location: [facility];  Service: General;  Laterality: N/A;  . INSERTION OF MESH N/A 04/13/2016   Procedure: INSERTION OF MESH;  Surgeon: Coralie Keens, MD;  Location: [facility];  Service: General;  Laterality: N/A;  . PILONIDAL CYST / SINUS EXCISION    . removal of sweat gland Bilateral    arms    OB History    No data available       Home Medications    Prior to Admission medications   Medication Sig Start Date End Date Taking? Authorizing Provider  dicyclomine (BENTYL) 20 MG tablet Take 1 tablet (20 mg total) by mouth 2 (two) times daily. 09/19/16   Tiffany Carlota Raspberry, PA-C    docusate sodium (COLACE) 100 MG capsule Take 1 capsule (100 mg total) by mouth every 12 (twelve) hours. 09/19/16   Tiffany Carlota Raspberry, PA-C  doxycycline (VIBRA-TABS) 100 MG tablet Take 1 tablet (100 mg total) by mouth 2 (two) times daily. Patient not taking: Reported on 09/19/2016 05/10/16   Coralie Keens, MD  famotidine (PEPCID) 20 MG tablet Take 1 tablet (20 mg total) by mouth daily. 11/11/16   Montine Circle, PA-C  HYDROcodone-acetaminophen (NORCO/VICODIN) 5-325 MG tablet Take 2 tablets by mouth every 6 (six) hours as needed for severe pain. 12/19/16   Junius Creamer, NP  levothyroxine (SYNTHROID, LEVOTHROID) 125 MCG tablet Take 125 mcg by mouth daily before breakfast.    Historical Provider, MD  lisinopril (PRINIVIL,ZESTRIL) 5 MG tablet Take 5 mg by mouth daily. 11/11/15 11/10/16  Historical Provider, MD  metoprolol (LOPRESSOR) 50 MG tablet Take 50 mg by mouth 2 (two) times daily. 09/12/15   Historical Provider, MD  ondansetron (ZOFRAN ODT) 4 MG disintegrating tablet Take 1 tablet (4 mg total) by mouth every 8 (eight) hours as needed for nausea or vomiting. 12/19/16   Junius Creamer, NP  ondansetron (ZOFRAN) 4 MG tablet Take 1 tablet (4 mg total) by mouth every 6 (six) hours. 09/19/16   Delos Haring, PA-C  oxyCODONE-acetaminophen (PERCOCET/ROXICET)  5-325 MG tablet Take 1-2 tablets by mouth every 4 (four) hours as needed for moderate pain. Patient not taking: Reported on 09/19/2016 05/10/16   Coralie Keens, MD  promethazine (PHENERGAN) 12.5 MG tablet Take 1 tablet (12.5 mg total) by mouth every 6 (six) hours as needed for nausea or vomiting. Patient not taking: Reported on 09/19/2016 04/22/16   Coralie Keens, MD  Rivaroxaban (XARELTO) 20 MG TABS Take 20 mg by mouth every evening.     Historical Provider, MD    Family History Family History  Problem Relation Age of Onset  . Hypertension Other   . Hyperlipidemia Other     Social History Social History  Substance Use Topics  . Smoking status: Never Smoker   . Smokeless tobacco: Never Used  . Alcohol use Yes     Comment: occasionally     Allergies   Penicillins   Review of Systems Review of Systems  Constitutional: Negative for fever.  HENT: Negative for trouble swallowing.   Respiratory: Negative for cough.   Cardiovascular: Negative for chest pain.  Gastrointestinal: Positive for abdominal pain, diarrhea, nausea and vomiting. Negative for abdominal distention.  Genitourinary: Negative for dysuria.  All other systems reviewed and are negative.    Physical Exam Updated Vital Signs BP 131/80 (BP Location: Right Arm)   Pulse 61   Temp 97.6 F (36.4 C) (Oral)   Resp (!) 22   LMP 11/09/2016 (Approximate)   SpO2 99%   Physical Exam  Constitutional: She appears well-nourished.  HENT:  Head: Normocephalic.  Eyes: Pupils are equal, round, and reactive to light.  Neck: Normal range of motion.  Cardiovascular: Normal rate.   Pulmonary/Chest: Effort normal.  Abdominal: Soft. She exhibits no distension. There is tenderness in the right upper quadrant.  Musculoskeletal: Normal range of motion.  Neurological: She is alert.  Skin: Skin is warm.  Psychiatric: She has a normal mood and affect.  Nursing note and vitals reviewed.    ED Treatments / Results  Labs (all labs ordered are listed, but only abnormal results are displayed) Labs Reviewed  URINALYSIS, ROUTINE W REFLEX MICROSCOPIC - Abnormal; Notable for the following:       Result Value   APPearance HAZY (*)    Specific Gravity, Urine 1.031 (*)    Ketones, ur 5 (*)    All other components within normal limits  LIPASE, BLOOD  COMPREHENSIVE METABOLIC PANEL  CBC  POC URINE PREG, ED    EKG  EKG Interpretation None       Radiology US Abdomen Limited  Result Date: 12/19/2016 CLINICAL DATA:  Right upper quadrant pain EXAM: US ABDOMEN LIMITED - RIGHT UPPER QUADRANT COMPARISON:  Abdominal ultrasound 09/19/2016 FINDINGS: Gallbladder: Multiple gallstones are again  seen. No gallbladder wall thickening or pericholecystic fluid. No positive sonographic Percell Miller sign was demonstrated by the sonographer. Common bile duct: Diameter: 3.5 mm, normal Liver: No focal lesion identified. Within normal limits in parenchymal echogenicity. IMPRESSION: Cholelithiasis without other evidence of acute cholecystitis. Electronically Signed   By: Ulyses Jarred M.D.   On: 12/19/2016 01:30    Procedures Procedures (including critical care time)  Medications Ordered in ED Medications  ondansetron (ZOFRAN-ODT) disintegrating tablet 4 mg (4 mg Oral Given 12/18/16 1934)  0.9 %  sodium chloride infusion ( Intravenous New Bag/Given 12/19/16 0133)  ondansetron (ZOFRAN) injection 4 mg (4 mg Intravenous Given 12/19/16 0132)  morphine 4 MG/ML injection 4 mg (4 mg Intravenous Given 12/19/16 0132)     Initial Impression /  Assessment and Plan / ED Course  I have reviewed the triage vital signs and the nursing notes.  Pertinent labs & imaging results that were available during my care of the patient were reviewed by me and considered in my medical decision making (see chart for details).     Specimen checked all parameters.  No change in her LFTs.  Ultrasound has been obtained showing gallstones without any gallbladder wall thickening.  No stone in the bladder neck.  Patient will be given antiemetic and pain control until she is seen by [facility] on Monday  Final Clinical Impressions(s) / ED Diagnoses   Final diagnoses:  Gall stones  Nausea vomiting and diarrhea    New Prescriptions New Prescriptions   HYDROCODONE-ACETAMINOPHEN (NORCO/VICODIN) 5-325 MG TABLET    Take 2 tablets by mouth every 6 (six) hours as needed for severe pain.   ONDANSETRON (ZOFRAN ODT) 4 MG DISINTEGRATING TABLET    Take 1 tablet (4 mg total) by mouth every 8 (eight) hours as needed for nausea or vomiting.     Junius Creamer, NP 12/19/16 8138    Merrily Pew, MD 12/20/16 [id number]

## 2016-12-21 ENCOUNTER — Other Ambulatory Visit: Payer: Self-pay | Admitting: Surgery

## 2017-01-13 ENCOUNTER — Encounter (HOSPITAL_COMMUNITY): Payer: Self-pay | Admitting: Emergency Medicine

## 2017-01-13 ENCOUNTER — Emergency Department (HOSPITAL_COMMUNITY)
Admission: EM | Admit: 2017-01-13 | Discharge: 2017-01-13 | Disposition: A | Discharge status: Home / Self Care | Payer: 59 | Attending: Emergency Medicine | Admitting: Emergency Medicine

## 2017-01-13 DIAGNOSIS — E039 Hypothyroidism, unspecified: Secondary | ICD-10-CM | POA: Insufficient documentation

## 2017-01-13 DIAGNOSIS — Z79899 Other long term (current) drug therapy: Secondary | ICD-10-CM | POA: Insufficient documentation

## 2017-01-13 DIAGNOSIS — R110 Nausea: Secondary | ICD-10-CM | POA: Diagnosis not present

## 2017-01-13 DIAGNOSIS — K8020 Calculus of gallbladder without cholecystitis without obstruction: Secondary | ICD-10-CM

## 2017-01-13 DIAGNOSIS — R1011 Right upper quadrant pain: Secondary | ICD-10-CM | POA: Diagnosis present

## 2017-01-13 LAB — CBC
HCT: 39.1 % (ref 36.0–46.0)
Hemoglobin: 13.4 g/dL (ref 12.0–15.0)
MCH: 32.1 pg (ref 26.0–34.0)
MCHC: 34.3 g/dL (ref 30.0–36.0)
MCV: 93.5 fL (ref 78.0–100.0)
PLATELETS: 368 10*3/uL (ref 150–400)
RBC: 4.18 MIL/uL (ref 3.87–5.11)
RDW: 12.1 % (ref 11.5–15.5)
WBC: 5.5 10*3/uL (ref 4.0–10.5)

## 2017-01-13 LAB — URINALYSIS, ROUTINE W REFLEX MICROSCOPIC
BILIRUBIN URINE: NEGATIVE
Glucose, UA: NEGATIVE mg/dL
Hgb urine dipstick: NEGATIVE
Ketones, ur: NEGATIVE mg/dL
LEUKOCYTES UA: NEGATIVE
NITRITE: NEGATIVE
PH: 6 (ref 5.0–8.0)
Protein, ur: NEGATIVE mg/dL
SPECIFIC GRAVITY, URINE: 1.019 (ref 1.005–1.030)

## 2017-01-13 LAB — COMPREHENSIVE METABOLIC PANEL
ALK PHOS: 71 U/L (ref 38–126)
ALT: 15 U/L (ref 14–54)
AST: 18 U/L (ref 15–41)
Albumin: 3.9 g/dL (ref 3.5–5.0)
Anion gap: 8 (ref 5–15)
BUN: 6 mg/dL (ref 6–20)
CALCIUM: 9 mg/dL (ref 8.9–10.3)
CO2: 25 mmol/L (ref 22–32)
CREATININE: 0.73 mg/dL (ref 0.44–1.00)
Chloride: 104 mmol/L (ref 101–111)
GFR calc Af Amer: >60 mL/min (ref >60)
GFR calc non Af Amer: >60 mL/min (ref >60)
GLUCOSE: 92 mg/dL (ref 65–99)
Potassium: 3.8 mmol/L (ref 3.5–5.1)
Sodium: 137 mmol/L (ref 135–145)
Total Bilirubin: 0.5 mg/dL (ref 0.3–1.2)
Total Protein: 7 g/dL (ref 6.5–8.1)

## 2017-01-13 LAB — LIPASE, BLOOD: LIPASE: 15 U/L (ref 11–51)

## 2017-01-13 LAB — I-STAT BETA HCG BLOOD, ED (MC, WL, AP ONLY)

## 2017-01-13 MED ORDER — ONDANSETRON 4 MG PO TBDP: 4.0000 mg | ORAL_TABLET | Freq: Three times a day (TID) | ORAL | 0 refills | Status: DC | PRN

## 2017-01-13 MED ORDER — ONDANSETRON 4 MG PO TBDP
8.0000 mg | ORAL_TABLET | Freq: Once | ORAL | Status: AC
  Administered 2017-01-13: 8 mg via ORAL
  Filled 2017-01-13: qty 2

## 2017-01-13 NOTE — Discharge Instructions (Signed)
Your abdominal pain is likely due to your gallstones. Eat a low fat diet, stay well hydrated, alternate between tylenol and motrin as needed for pain. Call your regular doctor to see if they'll refill your tramadol to help with pain. Use zofran as directed as needed for nausea. Follow up with your surgeon in 1-2 weeks for recheck of symptoms and ongoing management of your gallstones. Return to the ER for emergent changes or worsening symptoms.   Abdominal (belly) pain can be caused by many things. Your caregiver performed an examination and possibly ordered blood/urine tests and imaging (CT scan, x-rays, ultrasound). Many cases can be observed and treated at home after initial evaluation in the emergency department. Even though you are being discharged home, abdominal pain can be unpredictable. Therefore, you need a repeated exam if your pain does not resolve, returns, or worsens. Most patients with abdominal pain don't have to be admitted to the hospital or have surgery, but serious problems like appendicitis and gallbladder attacks can start out as nonspecific pain. Many abdominal conditions cannot be diagnosed in one visit, so follow-up evaluations are very important. SEEK IMMEDIATE MEDICAL ATTENTION IF YOU DEVELOP ANY OF THE FOLLOWING SYMPTOMS: The pain does not go away or becomes severe.  A temperature above 101 develops.  Repeated vomiting occurs (multiple episodes).  The pain becomes localized to portions of the abdomen. The right side could possibly be appendicitis. In an adult, the left lower portion of the abdomen could be colitis or diverticulitis.  Blood is being passed in stools or vomit (bright red or black tarry stools).  Return also if you develop chest pain, difficulty breathing, dizziness or fainting, or become confused, poorly responsive, or inconsolable (young children). The constipation stays for more than 4 days.  There is belly (abdominal) or rectal pain.  You do not seem to be  getting better.

## 2017-01-13 NOTE — ED Provider Notes (Signed)
[facility] DEPT Provider Note   CSN: [id number] Arrival date & time: 01/13/17  0048     History   Chief Complaint Chief Complaint  Patient presents with  . Abdominal Pain    HPI [name] is a 36 y.o. female with a PMHx of gallstones, graves disease, ventral hernia, PE on xarelto, hypothyroidism, HTN, and PSHx of incisional hernia repair and pilonidal cyst removal, who presents to the ED with complaints of recurrent right upper quadrant pain that has been chronic for months however has worsened over the last 2 days with associated nausea. She has known gallstones and states this feels the same as her prior gallstone problems. She is scheduled for cholecystectomy on 02/18/17 however her pain has worsened over the last 2 days so she called her surgeon's office who recommended that she be evaluated today to see if she needed more emergent surgery. She describes her pain as 8/10 constant sharp right upper quadrant pain radiating to her right upper back, worse with movement and eating, and with no treatments tried prior to arrival. Previously she was taking tramadol which helped, and was prescribed by her PCP Dr. Ronnald Ramp, however she does not have any left and Dr. [name] did not feel she needed anything additional for her pain at this time. Chart review reveals she was seen on 12/18/16 in the ED for similar complaints; had RUQ U/S that confirmed cholelithiasis without evidence of cholecystitis; discharged with norco and zofran, told to f/up with her surgeon.  She denies fevers, chills, CP, SOB, vomiting, diarrhea/constipation, obstipation, melena, hematochezia, hematuria, dysuria, vaginal bleeding/discharge, myalgias, arthralgias, numbness, tingling, focal weakness, or any other complaints at this time. Denies recent travel, sick contacts, suspicious food intake, EtOH use, or frequent NSAID use. Takes xarelto daily. LMP 2 months ago (has hx of irregular menses); +Sexually active with 1 female  partner, protected with condoms.    The history is provided by the patient and medical records. No language interpreter was used.  Abdominal Pain   This is a recurrent problem. The current episode started more than 1 week ago. The problem occurs constantly. The problem has been gradually worsening. The pain is associated with eating. The pain is located in the RUQ. The quality of the pain is sharp. The pain is at a severity of 8/10. The pain is moderate. Associated symptoms include nausea. Pertinent negatives include fever, diarrhea, flatus, hematochezia, melena, vomiting, constipation, dysuria, hematuria, arthralgias and myalgias. The symptoms are aggravated by eating and activity. Relieved by: previously tramadol helped; ran out, hasn't tried anything else recently. Past workup includes ultrasound and surgery (scheduled for 02/18/17). Her past medical history is significant for gallstones.    Past Medical History:  Diagnosis Date  . Complication of anesthesia    "difficulty waking up and will fight when woken up"  . Gallstones   . Graves disease   . Hernia, ventral   . History of blood clots   . History of pulmonary embolus (PE)   . Hypertension   . Hypothyroid   . Shortness of breath dyspnea     Patient Active Problem List   Diagnosis Date Noted  . Post op infection 05/03/2016  . Incisional hernia 04/13/2016    Past Surgical History:  Procedure Laterality Date  . BREAST SURGERY     cyst removed from right  . CESAREAN SECTION    . INCISIONAL HERNIA REPAIR  04/13/2016  . INCISIONAL HERNIA REPAIR N/A 04/13/2016   Procedure: INCISIONAL HERNIA REPAIR;  Surgeon:  Coralie Keens, MD;  Location: [facility];  Service: General;  Laterality: N/A;  . INSERTION OF MESH N/A 04/13/2016   Procedure: INSERTION OF MESH;  Surgeon: Coralie Keens, MD;  Location: [facility];  Service: General;  Laterality: N/A;  . PILONIDAL CYST / SINUS EXCISION    . removal of sweat gland Bilateral    arms    OB  History    No data available       Home Medications    Prior to Admission medications   Medication Sig Start Date End Date Taking? Authorizing Provider  dicyclomine (BENTYL) 20 MG tablet Take 1 tablet (20 mg total) by mouth 2 (two) times daily. 09/19/16   Delos Haring, PA-C  docusate sodium (COLACE) 100 MG capsule Take 1 capsule (100 mg total) by mouth every 12 (twelve) hours. 09/19/16   Delos Haring, PA-C  doxycycline (VIBRA-TABS) 100 MG tablet Take 1 tablet (100 mg total) by mouth 2 (two) times daily. Patient not taking: Reported on 09/19/2016 05/10/16   Coralie Keens, MD  famotidine (PEPCID) 20 MG tablet Take 1 tablet (20 mg total) by mouth daily. 11/11/16   Montine Circle, PA-C  HYDROcodone-acetaminophen (NORCO/VICODIN) 5-325 MG tablet Take 2 tablets by mouth every 6 (six) hours as needed for severe pain. 12/19/16   Junius Creamer, NP  levothyroxine (SYNTHROID, LEVOTHROID) 125 MCG tablet Take 125 mcg by mouth daily before breakfast.    [provider]  lisinopril (PRINIVIL,ZESTRIL) 5 MG tablet Take 5 mg by mouth daily. 11/11/15 11/10/16  [provider]  metoprolol (LOPRESSOR) 50 MG tablet Take 50 mg by mouth 2 (two) times daily. 09/12/15   [provider]  ondansetron (ZOFRAN ODT) 4 MG disintegrating tablet Take 1 tablet (4 mg total) by mouth every 8 (eight) hours as needed for nausea or vomiting. 12/19/16   Junius Creamer, NP  ondansetron (ZOFRAN) 4 MG tablet Take 1 tablet (4 mg total) by mouth every 6 (six) hours. 09/19/16   Delos Haring, PA-C  oxyCODONE-acetaminophen (PERCOCET/ROXICET) 5-325 MG tablet Take 1-2 tablets by mouth every 4 (four) hours as needed for moderate pain. Patient not taking: Reported on 09/19/2016 05/10/16   Coralie Keens, MD  promethazine (PHENERGAN) 12.5 MG tablet Take 1 tablet (12.5 mg total) by mouth every 6 (six) hours as needed for nausea or vomiting. Patient not taking: Reported on 09/19/2016 04/22/16   Coralie Keens, MD    Rivaroxaban (XARELTO) 20 MG TABS Take 20 mg by mouth every evening.     [provider]    Family History Family History  Problem Relation Age of Onset  . Hypertension Other   . Hyperlipidemia Other     Social History Social History  Substance Use Topics  . Smoking status: Never Smoker  . Smokeless tobacco: Never Used  . Alcohol use Yes     Comment: occasionally     Allergies   Penicillins   Review of Systems Review of Systems  Constitutional: Negative for chills and fever.  Respiratory: Negative for shortness of breath.   Cardiovascular: Negative for chest pain.  Gastrointestinal: Positive for abdominal pain and nausea. Negative for blood in stool, constipation, diarrhea, flatus, hematochezia, melena and vomiting.  Genitourinary: Negative for dysuria, hematuria, vaginal bleeding and vaginal discharge.  Musculoskeletal: Negative for arthralgias and myalgias.  Skin: Negative for color change.  Allergic/Immunologic: Negative for immunocompromised state.  Neurological: Negative for weakness and numbness.  Hematological: Bruises/bleeds easily (on xarelto).  Psychiatric/Behavioral: Negative for confusion.   All other systems reviewed and  are negative for acute change except as noted in the HPI.    Physical Exam Updated Vital Signs BP (!) 151/89 (BP Location: Right Arm)   Pulse 66   Temp 97.9 F (36.6 C) (Oral)   Resp 18   Ht 5\' 1"  (1.549 m)   Wt 95.3 kg   SpO2 100%   BMI 39.68 kg/m   Physical Exam  Constitutional: She is oriented to person, place, and time. Vital signs are normal. She appears well-developed and well-nourished.  Non-toxic appearance. No distress.  Afebrile, nontoxic, NAD  HENT:  Head: Normocephalic and atraumatic.  Mouth/Throat: Oropharynx is clear and moist and mucous membranes are normal.  Eyes: Conjunctivae and EOM are normal. Right eye exhibits no discharge. Left eye exhibits no discharge.  Neck: Normal range of motion. Neck  supple.  Cardiovascular: Normal rate, regular rhythm, normal heart sounds and intact distal pulses.  Exam reveals no gallop and no friction rub.   No murmur heard. Pulmonary/Chest: Effort normal and breath sounds normal. No respiratory distress. She has no decreased breath sounds. She has no wheezes. She has no rhonchi. She has no rales.  Abdominal: Soft. Normal appearance and bowel sounds are normal. She exhibits no distension. There is tenderness in the right upper quadrant. There is no rigidity, no rebound, no guarding, no CVA tenderness, no tenderness at McBurney's point and negative Murphy's sign.  Soft, obese but nondistended, +BS throughout, with mild RUQ TTP, no r/g/r, neg murphy's, neg mcburney's, no CVA TTP   Musculoskeletal: Normal range of motion.  Neurological: She is alert and oriented to person, place, and time. She has normal strength. No sensory deficit.  Skin: Skin is warm, dry and intact. No rash noted.  Psychiatric: She has a normal mood and affect.  Nursing note and vitals reviewed.    ED Treatments / Results  Labs (all labs ordered are listed, but only abnormal results are displayed) Labs Reviewed  LIPASE, BLOOD  COMPREHENSIVE METABOLIC PANEL  CBC  URINALYSIS, ROUTINE W REFLEX MICROSCOPIC  I-STAT BETA HCG BLOOD, ED ([facility], [facility], [facility] ONLY)    EKG  EKG Interpretation None       Radiology No results found.  US Abdomen Limited  Result Date: 12/19/2016 CLINICAL DATA:  Right upper quadrant pain EXAM: US ABDOMEN LIMITED - RIGHT UPPER QUADRANT COMPARISON:  Abdominal ultrasound 09/19/2016 FINDINGS: Gallbladder: Multiple gallstones are again seen. No gallbladder wall thickening or pericholecystic fluid. No positive sonographic Percell Miller sign was demonstrated by the sonographer. Common bile duct: Diameter: 3.5 mm, normal Liver: No focal lesion identified. Within normal limits in parenchymal echogenicity. IMPRESSION: Cholelithiasis without other evidence of acute cholecystitis.  Electronically Signed   By: Ulyses Jarred M.D.   On: 12/19/2016 01:30     Procedures Procedures (including critical care time)  Medications Ordered in ED Medications  ondansetron (ZOFRAN-ODT) disintegrating tablet 8 mg (8 mg Oral Given 01/13/17 0238)     Initial Impression / Assessment and Plan / ED Course  I have reviewed the triage vital signs and the nursing notes.  Pertinent labs & imaging results that were available during my care of the patient were reviewed by me and considered in my medical decision making (see chart for details).     36 y.o. female here with recurrent chronic RUQ pain worsened x2 days, with nausea. Has known gallstones, scheduled for surgery on 02/18/17. On exam, mild RUQ TTP, neg murphy's, nonperitoneal. Labs all unremarkable, LFTs reassuring. Just had RUQ U/S 12/18/16 which showed cholelithiasis without cholecystitis;  given reassuring exam, LFTs WNL, doubt need for repeat imaging. Will give zofran ODT and PO challenge. Pt drove herself so can't give narcotics. Advised tylenol/motrin for pain, and call her PCP to see if they're willing to refill her tramadol to help with pain but otherwise tylenol/motrin should help. Low fat diet advised. F/up with her surgeon to discuss ongoing management of her gallstones until her surgery. Doubt need for emergent surgical consultation. Will reassess after zofran  3:14 AM Nausea improved, tolerating PO well. Will d/c with previously outlined plan. I explained the diagnosis and have given explicit precautions to return to the ER including for any other new or worsening symptoms. The patient understands and accepts the medical plan as it's been dictated and I have answered their questions. Discharge instructions concerning home care and prescriptions have been given. The patient is STABLE and is discharged to home in good condition.    Final Clinical Impressions(s) / ED Diagnoses   Final diagnoses:  RUQ abdominal pain  Nausea    Symptomatic cholelithiasis    New Prescriptions New Prescriptions   ONDANSETRON (ZOFRAN ODT) 4 MG DISINTEGRATING TABLET    Take 1 tablet (4 mg total) by mouth every 8 (eight) hours as needed for nausea or vomiting.     [address] [place], [place] 01/13/17 0315    Merryl Hacker, MD 01/13/17 [phone]

## 2017-01-13 NOTE — ED Triage Notes (Signed)
Patient reports chronic RUQ pain for several years ( gallbladder disease) with nausea worse this week , denies emesis or fever , she is scheduled for cholecystectomy next month by Dr. [name] .

## 2017-01-13 NOTE — ED Notes (Signed)
Pt stable, understands discharge instructions, and reasons for return.   

## 2017-01-13 NOTE — ED Notes (Signed)
Pt tolerating PO fluids well at this time 

## 2017-01-15 ENCOUNTER — Encounter (HOSPITAL_COMMUNITY): Payer: Self-pay | Admitting: *Deleted

## 2017-01-15 NOTE — Progress Notes (Signed)
[name] left me a message that patient is to stop Xarelto now and cardiologist office, Dr Kristine Royal is caling patient in a prescription for  Levonox.   When I spoke to patient she reported that she stopped Xarelto on Saturday and that she informed nurse at Dr Trevor Mace office.

## 2017-01-15 NOTE — Progress Notes (Signed)
I spoke to Abigail Butts at Dr [name]' office and asked if they have instructions for patient's cardiologist regarding Eliquis.  Abigail Butts is going to check with Dr [name] and let me know.

## 2017-01-17 NOTE — H&P (Signed)
  [name]  Location: [facility] Patient #: [id number] DOB: 1981-06-01 Married / Language: English / Race: Black or African American Female   History of Present Illness    Patient words: Est New-Gallbladder.  The patient is a 36 year old female who presents for evaluation of gall stones. This patient is well-known to me with a history of a large ventral incisional hernia repair repair with mesh recently. She now has symptomatic cholelithiasis. She's been having right upper quadrant abdominal pain and nausea for some time. She has known for a while that she has had gallstones. The attacks of been becoming more frequent. She has no emesis. She denies fevers or chills. She had an ultrasound last week showing gallstones. There is no gallbladder wall thickening and the buttocks were normal. Her white blood count and liver function tests also normal.   Allergies  Penicillin VK *PENICILLINS*   Medication History  Levothyroxine Sodium (100MCG Tablet, Oral) Active. TraMADol HCl (50MG  Tablet, Oral) Active. Metoprolol Succinate (50MG  Tablet ER, Oral) Active. Xarelto (20MG  Tablet, Oral) Active. Dicyclomine HCl (20MG  Tablet, Oral) Active. Levothyroxine Sodium (125MCG Tablet, Oral) Active. Lisinopril (5MG  Tablet, Oral) Active. Metoprolol Tartrate (50MG  Tablet, Oral) Active. TiZANidine HCl (4MG  Tablet, Oral) Active. PredniSONE (10MG  Tablet, Oral) Active. Medications Reconciled  Other Problems Cholelithiasis  High blood pressure  Other disease, cancer, significant illness  Pulmonary Embolism / Blood Clot in Legs     Diagnostic Studies Colonoscopy  never Mammogram  never Pap Smear  1-5 years ago  Allergies Elbert Ewings, CMA; 10/15/2015 2:38 PM) Penicillin VK *PENICILLINS*    Vitals   Weight: 215.8 lb Height: 61in Body Surface Area: 1.95 m Body Mass Index: 40.77 kg/m  Temp.: 98.42F(Oral)  Pulse: 57 (Regular)  BP: 118/70  (Sitting, Left Arm, Standard)   Physical Exam   The physical exam findings are as follows: Note:On examination, she is morbidly obese. Lungs are clear to auscultation bilaterally Cardiovascular is regular rate and rhythm Abdomen is soft and obese. Her midline incision is well-healed. There is mild tenderness in the right upper quadrant. Skin without rash Ext without edema Generally well in appearance    Assessment & Plan (Judas Mohammad A. [name] MD; 12/21/2016 11:12 AM) SYMPTOMATIC CHOLELITHIASIS (K80.20) Impression: I discussed gallbladder pathology and gallstones with her in detail and gave her literature regarding cholecystectomy. I discussed laparoscopic cholecystectomy in detail. I discussed the risks of surgery which includes but is not limited to bleeding, infection, cardiopulmonary issues, DVT, the need to convert to an open procedure given her previous surgery, bile duct injury, bile leak, postoperative recovery, etc. She will stop her blood thinning medication 5 days preoperatively. Surgery will be scheduled

## 2017-01-18 ENCOUNTER — Encounter (HOSPITAL_COMMUNITY)
Admission: RE | Disposition: A | Discharge status: Home / Self Care | Payer: Self-pay | Source: Ambulatory Visit | Attending: Surgery

## 2017-01-18 ENCOUNTER — Observation Stay (HOSPITAL_COMMUNITY)
Admission: RE | Admit: 2017-01-18 | Discharge: 2017-01-19 | Disposition: A | Discharge status: Home / Self Care | Payer: 59 | Source: Ambulatory Visit | Attending: Surgery | Admitting: Surgery

## 2017-01-18 ENCOUNTER — Ambulatory Visit (HOSPITAL_COMMUNITY): Payer: 59 | Admitting: Certified Registered"

## 2017-01-18 ENCOUNTER — Encounter (HOSPITAL_COMMUNITY): Payer: Self-pay | Admitting: Urology

## 2017-01-18 DIAGNOSIS — Z6841 Body Mass Index (BMI) 40.0 and over, adult: Secondary | ICD-10-CM | POA: Insufficient documentation

## 2017-01-18 DIAGNOSIS — Z880 Allergy status to penicillin: Secondary | ICD-10-CM | POA: Diagnosis not present

## 2017-01-18 DIAGNOSIS — K8010 Calculus of gallbladder with chronic cholecystitis without obstruction: Principal | ICD-10-CM | POA: Insufficient documentation

## 2017-01-18 DIAGNOSIS — Z9049 Acquired absence of other specified parts of digestive tract: Secondary | ICD-10-CM

## 2017-01-18 DIAGNOSIS — I10 Essential (primary) hypertension: Secondary | ICD-10-CM | POA: Insufficient documentation

## 2017-01-18 DIAGNOSIS — Z79899 Other long term (current) drug therapy: Secondary | ICD-10-CM | POA: Insufficient documentation

## 2017-01-18 HISTORY — DX: Thyrotoxicosis, unspecified without thyrotoxic crisis or storm: E05.90

## 2017-01-18 HISTORY — PX: CHOLECYSTECTOMY: SHX55

## 2017-01-18 LAB — PROTIME-INR
INR: 0.95
Prothrombin Time: 12.7 seconds (ref 11.4–15.2)

## 2017-01-18 SURGERY — LAPAROSCOPIC CHOLECYSTECTOMY
Anesthesia: General

## 2017-01-18 MED ORDER — FENTANYL CITRATE (PF) 100 MCG/2ML IJ SOLN
INTRAMUSCULAR | Status: DC | PRN
  Administered 2017-01-18 (×2): 100 ug via INTRAVENOUS
  Administered 2017-01-18: 50 ug via INTRAVENOUS

## 2017-01-18 MED ORDER — MIDAZOLAM HCL 5 MG/5ML IJ SOLN
INTRAMUSCULAR | Status: DC | PRN
  Administered 2017-01-18: 2 mg via INTRAVENOUS

## 2017-01-18 MED ORDER — HYDROMORPHONE HCL 1 MG/ML IJ SOLN
INTRAMUSCULAR | Status: AC
  Filled 2017-01-18: qty 1

## 2017-01-18 MED ORDER — CHLORHEXIDINE GLUCONATE CLOTH 2 % EX PADS: 6.0000 | MEDICATED_PAD | Freq: Once | CUTANEOUS | Status: DC

## 2017-01-18 MED ORDER — CIPROFLOXACIN IN D5W 400 MG/200ML IV SOLN
400.0000 mg | INTRAVENOUS | Status: AC
Start: 2017-01-18 — End: 2017-01-18
  Administered 2017-01-18: 400 mg via INTRAVENOUS
  Filled 2017-01-18: qty 200

## 2017-01-18 MED ORDER — SODIUM CHLORIDE 0.9 % IR SOLN
Status: DC | PRN
  Administered 2017-01-18: 1000 mL

## 2017-01-18 MED ORDER — ACETAMINOPHEN 325 MG PO TABS: 650.0000 mg | ORAL_TABLET | Freq: Four times a day (QID) | ORAL | Status: DC | PRN

## 2017-01-18 MED ORDER — LISINOPRIL 5 MG PO TABS
5.0000 mg | ORAL_TABLET | Freq: Every day | ORAL | Status: DC
  Administered 2017-01-18 – 2017-01-19 (×2): 5 mg via ORAL
  Filled 2017-01-18 (×2): qty 1

## 2017-01-18 MED ORDER — HYDROMORPHONE HCL 1 MG/ML IJ SOLN
0.2500 mg | INTRAMUSCULAR | Status: DC | PRN
Start: 2017-01-18 — End: 2017-01-18
  Administered 2017-01-18 (×2): 0.5 mg via INTRAVENOUS

## 2017-01-18 MED ORDER — PROMETHAZINE HCL 25 MG/ML IJ SOLN: 6.2500 mg | INTRAMUSCULAR | Status: DC | PRN

## 2017-01-18 MED ORDER — BUPIVACAINE HCL (PF) 0.25 % IJ SOLN
INTRAMUSCULAR | Status: DC | PRN
  Administered 2017-01-18: 20 mL

## 2017-01-18 MED ORDER — FENTANYL CITRATE (PF) 250 MCG/5ML IJ SOLN
INTRAMUSCULAR | Status: AC
  Filled 2017-01-18: qty 5

## 2017-01-18 MED ORDER — PROPOFOL 10 MG/ML IV BOLUS
INTRAVENOUS | Status: DC | PRN
  Administered 2017-01-18: 160 mg via INTRAVENOUS

## 2017-01-18 MED ORDER — OXYCODONE-ACETAMINOPHEN 5-325 MG PO TABS
1.0000 | ORAL_TABLET | ORAL | Status: DC | PRN
  Administered 2017-01-19 (×2): 2 via ORAL
  Filled 2017-01-18 (×2): qty 2

## 2017-01-18 MED ORDER — METOPROLOL TARTRATE 50 MG PO TABS
50.0000 mg | ORAL_TABLET | Freq: Once | ORAL | Status: AC
  Administered 2017-01-18: 50 mg via ORAL
  Filled 2017-01-18 (×2): qty 1

## 2017-01-18 MED ORDER — DEXAMETHASONE SODIUM PHOSPHATE 10 MG/ML IJ SOLN
INTRAMUSCULAR | Status: DC | PRN
  Administered 2017-01-18: 10 mg via INTRAVENOUS

## 2017-01-18 MED ORDER — PROPOFOL 10 MG/ML IV BOLUS
INTRAVENOUS | Status: AC
  Filled 2017-01-18: qty 20

## 2017-01-18 MED ORDER — DIPHENHYDRAMINE HCL 50 MG/ML IJ SOLN: 25.0000 mg | Freq: Four times a day (QID) | INTRAMUSCULAR | Status: DC | PRN

## 2017-01-18 MED ORDER — ONDANSETRON HCL 4 MG/2ML IJ SOLN
4.0000 mg | Freq: Four times a day (QID) | INTRAMUSCULAR | Status: DC | PRN
  Administered 2017-01-18 – 2017-01-19 (×3): 4 mg via INTRAVENOUS
  Filled 2017-01-18 (×3): qty 2

## 2017-01-18 MED ORDER — BUPIVACAINE HCL (PF) 0.25 % IJ SOLN
INTRAMUSCULAR | Status: AC
  Filled 2017-01-18: qty 30

## 2017-01-18 MED ORDER — MIDAZOLAM HCL 2 MG/2ML IJ SOLN
INTRAMUSCULAR | Status: AC
  Filled 2017-01-18: qty 2

## 2017-01-18 MED ORDER — LIDOCAINE HCL (CARDIAC) 20 MG/ML IV SOLN
INTRAVENOUS | Status: DC | PRN
  Administered 2017-01-18: 100 mg via INTRAVENOUS

## 2017-01-18 MED ORDER — ENOXAPARIN SODIUM 40 MG/0.4ML ~~LOC~~ SOLN
40.0000 mg | SUBCUTANEOUS | Status: DC
  Administered 2017-01-19: 40 mg via SUBCUTANEOUS
  Filled 2017-01-18: qty 0.4

## 2017-01-18 MED ORDER — PROMETHAZINE HCL 25 MG/ML IJ SOLN
12.5000 mg | Freq: Once | INTRAMUSCULAR | Status: AC
  Administered 2017-01-18: 12.5 mg via INTRAVENOUS
  Filled 2017-01-18: qty 1

## 2017-01-18 MED ORDER — ROCURONIUM BROMIDE 100 MG/10ML IV SOLN
INTRAVENOUS | Status: DC | PRN
  Administered 2017-01-18: 10 mg via INTRAVENOUS
  Administered 2017-01-18: 40 mg via INTRAVENOUS
  Administered 2017-01-18: 20 mg via INTRAVENOUS

## 2017-01-18 MED ORDER — STUDY - INVESTIGATIONAL DRUG SIMPLE RECORD
Status: AC
  Filled 2017-01-18: qty 0

## 2017-01-18 MED ORDER — LACTATED RINGERS IV SOLN
INTRAVENOUS | Status: DC | PRN
  Administered 2017-01-18 (×2): via INTRAVENOUS

## 2017-01-18 MED ORDER — ONDANSETRON 4 MG PO TBDP: 4.0000 mg | ORAL_TABLET | Freq: Four times a day (QID) | ORAL | Status: DC | PRN

## 2017-01-18 MED ORDER — POTASSIUM CHLORIDE IN NACL 20-0.9 MEQ/L-% IV SOLN
INTRAVENOUS | Status: DC
  Administered 2017-01-18 (×2): via INTRAVENOUS
  Filled 2017-01-18 (×2): qty 1000

## 2017-01-18 MED ORDER — DIPHENHYDRAMINE HCL 25 MG PO CAPS: 25.0000 mg | ORAL_CAPSULE | Freq: Four times a day (QID) | ORAL | Status: DC | PRN

## 2017-01-18 MED ORDER — METOPROLOL TARTRATE 50 MG PO TABS
50.0000 mg | ORAL_TABLET | Freq: Two times a day (BID) | ORAL | Status: DC
  Administered 2017-01-18 – 2017-01-19 (×2): 50 mg via ORAL
  Filled 2017-01-18 (×2): qty 1

## 2017-01-18 MED ORDER — ONDANSETRON HCL 4 MG/2ML IJ SOLN
INTRAMUSCULAR | Status: DC | PRN
  Administered 2017-01-18: 4 mg via INTRAVENOUS

## 2017-01-18 MED ORDER — LEVOTHYROXINE SODIUM 25 MCG PO TABS
125.0000 ug | ORAL_TABLET | Freq: Every day | ORAL | Status: DC
  Administered 2017-01-19: 08:00:00 125 ug via ORAL
  Filled 2017-01-18: qty 1

## 2017-01-18 MED ORDER — 0.9 % SODIUM CHLORIDE (POUR BTL) OPTIME
TOPICAL | Status: DC | PRN
  Administered 2017-01-18: 1000 mL

## 2017-01-18 MED ORDER — HEMOSTATIC AGENTS (NO CHARGE) OPTIME
TOPICAL | Status: DC | PRN
  Administered 2017-01-18: 1 via TOPICAL

## 2017-01-18 MED ORDER — SUGAMMADEX SODIUM 200 MG/2ML IV SOLN
INTRAVENOUS | Status: DC | PRN
  Administered 2017-01-18: 400 mg via INTRAVENOUS

## 2017-01-18 MED ORDER — MORPHINE SULFATE (PF) 4 MG/ML IV SOLN
1.0000 mg | INTRAVENOUS | Status: DC | PRN
  Administered 2017-01-18: 4 mg via INTRAVENOUS
  Administered 2017-01-18: 2 mg via INTRAVENOUS
  Filled 2017-01-18 (×2): qty 1

## 2017-01-18 MED ORDER — HYDROCODONE-ACETAMINOPHEN 5-325 MG PO TABS
1.0000 | ORAL_TABLET | ORAL | Status: DC | PRN
  Administered 2017-01-18: 2 via ORAL
  Filled 2017-01-18: qty 2

## 2017-01-18 MED ORDER — ACETAMINOPHEN 650 MG RE SUPP: 650.0000 mg | Freq: Four times a day (QID) | RECTAL | Status: DC | PRN

## 2017-01-18 SURGICAL SUPPLY — 40 items
ADH SKN CLS APL DERMABOND .7 (GAUZE/BANDAGES/DRESSINGS) ×1
APPLIER CLIP 5 13 M/L LIGAMAX5 (MISCELLANEOUS) ×3
APR CLP MED LRG 5 ANG JAW (MISCELLANEOUS) ×1
BAG SPEC RTRVL LRG 6X4 10 (ENDOMECHANICALS) ×1
CANISTER SUCT 3000ML PPV (MISCELLANEOUS) ×3 IMPLANT
CHLORAPREP W/TINT 26ML (MISCELLANEOUS) ×3 IMPLANT
CLIP APPLIE 5 13 M/L LIGAMAX5 (MISCELLANEOUS) ×1 IMPLANT
COVER SURGICAL LIGHT HANDLE (MISCELLANEOUS) ×3 IMPLANT
DERMABOND ADVANCED (GAUZE/BANDAGES/DRESSINGS) ×2
DERMABOND ADVANCED .7 DNX12 (GAUZE/BANDAGES/DRESSINGS) ×1 IMPLANT
ELECT REM PT RETURN 9FT ADLT (ELECTROSURGICAL) ×3
ELECTRODE REM PT RTRN 9FT ADLT (ELECTROSURGICAL) ×1 IMPLANT
GLOVE BIO SURGEON STRL SZ7.5 (GLOVE) ×2 IMPLANT
GLOVE BIOGEL PI IND STRL 7.0 (GLOVE) IMPLANT
GLOVE BIOGEL PI IND STRL 7.5 (GLOVE) IMPLANT
GLOVE BIOGEL PI INDICATOR 7.0 (GLOVE) ×4
GLOVE BIOGEL PI INDICATOR 7.5 (GLOVE) ×2
GLOVE SURG SIGNA 7.5 PF LTX (GLOVE) ×3 IMPLANT
GLOVE SURG SS PI 6.5 STRL IVOR (GLOVE) ×4 IMPLANT
GOWN STRL REUS W/ TWL LRG LVL3 (GOWN DISPOSABLE) ×2 IMPLANT
GOWN STRL REUS W/ TWL XL LVL3 (GOWN DISPOSABLE) ×1 IMPLANT
GOWN STRL REUS W/TWL LRG LVL3 (GOWN DISPOSABLE) ×9
GOWN STRL REUS W/TWL XL LVL3 (GOWN DISPOSABLE) ×3
HEMOSTAT SNOW SURGICEL 2X4 (HEMOSTASIS) ×2 IMPLANT
KIT BASIN OR (CUSTOM PROCEDURE TRAY) ×3 IMPLANT
KIT ROOM TURNOVER OR (KITS) ×3 IMPLANT
NS IRRIG 1000ML POUR BTL (IV SOLUTION) ×3 IMPLANT
PAD ARMBOARD 7.5X6 YLW CONV (MISCELLANEOUS) ×3 IMPLANT
POUCH SPECIMEN RETRIEVAL 10MM (ENDOMECHANICALS) ×3 IMPLANT
SCISSORS LAP 5X35 DISP (ENDOMECHANICALS) ×3 IMPLANT
SET IRRIG TUBING LAPAROSCOPIC (IRRIGATION / IRRIGATOR) ×3 IMPLANT
SLEEVE ENDOPATH XCEL 5M (ENDOMECHANICALS) ×8 IMPLANT
SPECIMEN JAR SMALL (MISCELLANEOUS) ×3 IMPLANT
SUT MNCRL AB 4-0 PS2 18 (SUTURE) ×3 IMPLANT
TOWEL OR 17X24 6PK STRL BLUE (TOWEL DISPOSABLE) ×3 IMPLANT
TOWEL OR 17X26 10 PK STRL BLUE (TOWEL DISPOSABLE) ×3 IMPLANT
TRAY LAPAROSCOPIC MC (CUSTOM PROCEDURE TRAY) ×3 IMPLANT
TROCAR XCEL BLUNT TIP 100MML (ENDOMECHANICALS) ×3 IMPLANT
TROCAR XCEL NON-BLD 5MMX100MML (ENDOMECHANICALS) ×3 IMPLANT
TUBING INSUFFLATION (TUBING) ×3 IMPLANT

## 2017-01-18 NOTE — Interval H&P Note (Signed)
History and Physical Interval Note: no change in H and P  01/18/2017 6:49 AM  [place]  has presented today for surgery, with the diagnosis of symptomatic gallstones  The various methods of treatment have been discussed with the patient and family. After consideration of risks, benefits and other options for treatment, the patient has consented to  Procedure(s): LAPAROSCOPIC POSSIBLE OPEN  CHOLECYSTECTOMY (N/A) as a surgical intervention .  The patient's history has been reviewed, patient examined, no change in status, stable for surgery.  I have reviewed the patient's chart and labs.  Questions were answered to the patient's satisfaction.     [name] A

## 2017-01-18 NOTE — Anesthesia Procedure Notes (Signed)
Procedure Name: Intubation Date/Time: 01/18/2017 7:42 AM Performed by: Gaylene Brooks Pre-anesthesia Checklist: Patient identified, Emergency Drugs available, Suction available and Patient being monitored Patient Re-evaluated:Patient Re-evaluated prior to inductionOxygen Delivery Method: Circle System Utilized Preoxygenation: Pre-oxygenation with 100% oxygen Intubation Type: IV induction Ventilation: Mask ventilation without difficulty, Oral airway inserted - appropriate to patient size and Two handed mask ventilation required Laryngoscope Size: Miller and 2 Grade View: Grade II Tube type: Oral Number of attempts: 1 Airway Equipment and Method: Stylet and Oral airway Placement Confirmation: ETT inserted through vocal cords under direct vision,  positive ETCO2 and breath sounds checked- equal and bilateral Secured at: 23 cm Tube secured with: Tape Dental Injury: Teeth and Oropharynx as per pre-operative assessment

## 2017-01-18 NOTE — Anesthesia Preprocedure Evaluation (Addendum)
Anesthesia Evaluation  Patient identified by MRN, date of birth, ID band Patient awake    Reviewed: Allergy & Precautions, NPO status , Patient's Chart, lab work & pertinent test results  Airway Mallampati: III  TM Distance: >3 FB Neck ROM: Full    Dental  (+) Teeth Intact, Dental Advisory Given   Pulmonary shortness of breath,    breath sounds clear to auscultation       Cardiovascular hypertension,  Rhythm:Regular Rate:Normal     Neuro/Psych negative neurological ROS     GI/Hepatic   Endo/Other  Morbid obesity  Renal/GU      Musculoskeletal   Abdominal   Peds  Hematology   Anesthesia Other Findings   Reproductive/Obstetrics                           Anesthesia Physical Anesthesia Plan  ASA: II  Anesthesia Plan: General   Post-op Pain Management:    Induction: Intravenous  Airway Management Planned: Oral ETT  Additional Equipment:   Intra-op Plan:   Post-operative Plan: Extubation in OR  Informed Consent: I have reviewed the patients History and Physical, chart, labs and discussed the procedure including the risks, benefits and alternatives for the proposed anesthesia with the patient or authorized representative who has indicated his/her understanding and acceptance.     Plan Discussed with: CRNA  Anesthesia Plan Comments:        Anesthesia Quick Evaluation

## 2017-01-18 NOTE — Anesthesia Postprocedure Evaluation (Signed)
Anesthesia Post Note  Patient: [name]  Procedure(s) Performed: Procedure(s) (LRB): LAPAROSCOPIC CHOLECYSTECTOMY (N/A)  Patient location during evaluation: PACU Anesthesia Type: General Level of consciousness: awake and alert Pain management: pain level controlled Vital Signs Assessment: post-procedure vital signs reviewed and stable Respiratory status: spontaneous breathing, nonlabored ventilation, respiratory function stable and patient connected to nasal cannula oxygen Cardiovascular status: blood pressure returned to baseline and stable Postop Assessment: no signs of nausea or vomiting Anesthetic complications: no       Last Vitals:  Vitals:   01/18/17 0937 01/18/17 1000  BP:  (!) 161/99  Pulse: 83 72  Resp: 18 17  Temp: 36.7 C 36.9 C    Last Pain:  Vitals:   01/18/17 1014  TempSrc:   PainSc: 8                  Takita Riecke,JAMES TERRILL

## 2017-01-18 NOTE — Op Note (Signed)
LAPAROSCOPIC CHOLECYSTECTOMY  Procedure Note  Lauren [name] 01/18/2017   Pre-op Diagnosis: symptomatic gallstones     Post-op Diagnosis: same  Procedure(s): LAPAROSCOPIC CHOLECYSTECTOMY  Surgeon(s): Coralie Keens, MD  Anesthesia: General  Staff:  Circulator: Gildardo Cranker, RN; Sharen Heck, RN Scrub Person: Christen Bame, RN RN First Assistant: Quincy Carnes, RN  Estimated Blood Loss: Minimal               Specimens: sent to path  Indications: This is [name] 36 year old female with morbid obesity on chronic anticoagulation medication who is had [name] previous large ventral incisional hernia repair with mesh. She now has symptomatic cholelithiasis so decision was made to proceed to the operating room to attempt [name] laparoscopic cholecystectomy  Findings: The patient was found to have extensive matted adhesions of omentum and bowel to the abdominal wall. I was able to lyse adhesions and then complete the cholecystectomy laparoscopically  Procedure: The patient was brought to the operating room and identified as correct patient. She was placed supine on the operating table and general anesthesia was induced. Her abdomen was then prepped and draped in the usual sterile fashion. I made [name] small incision in the patient's left upper quadrant with [name] scalpel. I then used the 5 mm trocar and Optiview camera to slowly traversing all layers of the abdominal wall and gain entrance into the peritoneal cavity. Insufflation was then begun with carbon dioxide. I evaluated the entrance site was all no evidence of bowel injury. The patient had excision evident amount of effusions to the abdominal wall. I was able to identify an area in the epigastrium place [name] 5 mm trocar there under direct vision. Using the scissors I was unable to free up adhesions from the abdominal wall including [name] loop of small bowel. This then allowed me to place 2 more 5 mm trochars in the patient's right upper quadrant  under direct vision. I was unable to identify the gallbladder. I was able to retract it above the liver bed. The base of the gallbladder was then dissected out. Identify the cystic duct and treated critical and around. I clipped it proximally and distally and then transected it. The cystic artery was identified and clipped proximal and distally and transected as well. The gallbladder was then dissected free from liver bed with electrocautery. Hemostasis appeared to be achieved in the liver bed with the cautery. I changed the 5 mm trocar in the epigastrium to an 11 mm trocar under direct vision. I placed an Endosac through this incision and placed the gallbladder in the Endosac and removed through the epigastric incision. I placed [name] piece of surgical snow in the gallbladder fossa. I then thoroughly irrigated the abdomen with normal saline.  Again, hemostasis appeared to be achieved. I looked around the abdominal cavity and saw no evidence of bowel injury. All trochars then removed under direct vision and the abdomen was deflated. I close the 11 mm trocar site with [name] figure-of-eight 0 Vicryl suture. All incisions were then anesthetized Marcaine and closed with 4-0 Monocryl sutures. Skin glue was applied. The patient tolerated procedure well. All counts were correct at the end of the procedure. The patient was then extubated in the operating room and taken in [name] stable condition to the recovery room.          Lauren [name]   Date: 01/18/2017  Time: 8:42 AM

## 2017-01-18 NOTE — Transfer of Care (Signed)
Immediate Anesthesia Transfer of Care Note  Patient: [name]  Procedure(s) Performed: Procedure(s): LAPAROSCOPIC CHOLECYSTECTOMY (N/A)  Patient Location: PACU  Anesthesia Type:General  Level of Consciousness: awake, alert  and oriented  Airway & Oxygen Therapy: Patient Spontanous Breathing and Patient connected to face mask oxygen  Post-op Assessment: Report given to RN, Post -op Vital signs reviewed and stable and Patient moving all extremities X 4  Post vital signs: Reviewed and stable  Last Vitals:  Vitals:   01/18/17 0702 01/18/17 0853  BP: (!) 162/96   Pulse: 75   Resp:    Temp:  (P) 36.9 C    Last Pain:  Vitals:   01/18/17 0658  TempSrc:   PainSc: 5       Patients Stated Pain Goal: 3 (03/55/97 4163)  Complications: No apparent anesthesia complications

## 2017-01-19 ENCOUNTER — Encounter (HOSPITAL_COMMUNITY): Payer: Self-pay | Admitting: Surgery

## 2017-01-19 DIAGNOSIS — K8010 Calculus of gallbladder with chronic cholecystitis without obstruction: Secondary | ICD-10-CM | POA: Diagnosis not present

## 2017-01-19 LAB — CBC
HEMATOCRIT: 36.3 % (ref 36.0–46.0)
Hemoglobin: 12.3 g/dL (ref 12.0–15.0)
MCH: 31.9 pg (ref 26.0–34.0)
MCHC: 33.9 g/dL (ref 30.0–36.0)
MCV: 94 fL (ref 78.0–100.0)
Platelets: 305 10*3/uL (ref 150–400)
RBC: 3.86 MIL/uL — ABNORMAL LOW (ref 3.87–5.11)
RDW: 12.2 % (ref 11.5–15.5)
WBC: 10.9 10*3/uL — ABNORMAL HIGH (ref 4.0–10.5)

## 2017-01-19 LAB — COMPREHENSIVE METABOLIC PANEL
ALT: 42 U/L (ref 14–54)
ANION GAP: 9 (ref 5–15)
AST: 41 U/L (ref 15–41)
Albumin: 3.4 g/dL — ABNORMAL LOW (ref 3.5–5.0)
Alkaline Phosphatase: 69 U/L (ref 38–126)
BILIRUBIN TOTAL: 1.1 mg/dL (ref 0.3–1.2)
BUN: 5 mg/dL — ABNORMAL LOW (ref 6–20)
CO2: 25 mmol/L (ref 22–32)
Calcium: 8.8 mg/dL — ABNORMAL LOW (ref 8.9–10.3)
Chloride: 106 mmol/L (ref 101–111)
Creatinine, Ser: 0.73 mg/dL (ref 0.44–1.00)
GFR calc Af Amer: >60 mL/min (ref >60)
Glucose, Bld: 86 mg/dL (ref 65–99)
Potassium: 3.9 mmol/L (ref 3.5–5.1)
Sodium: 140 mmol/L (ref 135–145)
Total Protein: 6.4 g/dL — ABNORMAL LOW (ref 6.5–8.1)

## 2017-01-19 MED ORDER — ONDANSETRON HCL 4 MG PO TABS: 4.0000 mg | ORAL_TABLET | Freq: Three times a day (TID) | ORAL | 1 refills | Status: DC | PRN

## 2017-01-19 MED ORDER — OXYCODONE-ACETAMINOPHEN 5-325 MG PO TABS: 1.0000 | ORAL_TABLET | ORAL | 0 refills | Status: DC | PRN

## 2017-01-19 NOTE — Discharge Instructions (Signed)
[facility]OS[facility] SUR[facility]: POST OP INSTRUCTIONS Always review your discharge instruction sheet given to you by the facility where your surgery was performed. IF YOU HAVE DISABILITY OR FAMILY LEAVE FORMS, YOU MUST BRING THEM TO THE OFFICE FOR PROCESSING.   DO NOT GIVE THEM TO YOUR DOCTOR.  1. A prescription for pain medication may be given to you upon discharge.  Take your pain medication as prescribed, if needed.  If narcotic pain medicine is not needed, then you may take acetaminophen (Tylenol) or ibuprofen (Advil) as needed. 2. Take your usually prescribed medications unless otherwise directed. 3. If you need a refill on your pain medication, please contact your pharmacy.  They will contact our office to request authorization. Prescriptions will not be filled after 5pm or on week-ends. 4. You should follow a light diet the first few days after arrival home, such as soup and crackers, etc.  Be sure to include lots of fluids daily. 5. Most patients will experience some swelling and bruising in the area of the incisions.  Ice packs will help.  Swelling and bruising can take several days to resolve.  6. It is common to experience some constipation if taking pain medication after surgery.  Increasing fluid intake and taking a stool softener (such as Colace) will usually help or prevent this problem from occurring.  A mild laxative (Milk of Magnesia or Miralax) should be taken according to package instructions if there are no bowel movements after 48 hours. 7. Unless discharge instructions indicate otherwise, you may remove your bandages 24-48 hours after surgery, and you may shower at that time.  You may have steri-strips (small skin tapes) in place directly over the incision.  These strips should be left on the skin for 7-10 days.  If your surgeon used skin glue on the incision, you may shower in 24 hours.  The glue will flake off over the next 2-3 weeks.  Any sutures or  staples will be removed at the office during your follow-up visit. 8. ACTIVITIES:  You may resume regular (light) daily activities beginning the next day--such as daily self-care, walking, climbing stairs--gradually increasing activities as tolerated.  You may have sexual intercourse when it is comfortable.  Refrain from any heavy lifting or straining until approved by your doctor. a. You may drive when you are no longer taking prescription pain medication, you can comfortably wear a seatbelt, and you can safely maneuver your car and apply brakes. b. RETURN TO WORK:  __MONDAY, MAY 21  c. ________________________________________________________ 9. You should see your doctor in the office for a follow-up appointment approximately 2-3 weeks after your surgery.  Make sure that you call for this appointment within a day or two after you arrive home to insure a convenient appointment time. 10. OTHER INSTRUCTIONS: __________________________________________________________________________________________________________________________ __________________________________________________________________________________________________________________________ WHEN TO CALL YOUR DOCTOR: 1. Fever over 101.0 2. Inability to urinate 3. Continued bleeding from incision. 4. Increased pain, redness, or drainage from the incision. 5. Increasing abdominal pain     TO WHOM IT MAY CONCERN:  [name] IS RECOVERING FROM SURGERY AND MAY RETURN TO WORK ON Monday, MAY 21 TO FULL ACTIVITY WITHOUT LIMITS  THANK YOU  Coralie Keens, MD  The clinic staff is available to answer your questions during regular business hours.  Please dont hesitate to call and ask to speak to one of the nurses for clinical concerns.  If you have a medical emergency, go to the nearest emergency room or call 911.  A surgeon from [facility] is always on call at the hospital. [address] [place], [place], [place]   [address] ? [address], [place], [place]   [id number] [phone] ? [phone] ? FAX [phone] Web site: [email or web address]

## 2017-01-19 NOTE — Progress Notes (Signed)
Patient discharged to home with instructions and prescription. 

## 2017-01-19 NOTE — Discharge Summary (Signed)
Physician Discharge Summary  Patient ID: [name] MRN: [id number] DOB/AGE: 09-11-1980 36 y.o.  Admit date: 01/18/2017 Discharge date: 01/19/2017  Admission Diagnoses:  Discharge Diagnoses:  Active Problems:   S/P laparoscopic cholecystectomy symptomatic cholelithiasis  Discharged Condition: good  Hospital Course: uneventful post op recovery .  Discharged home POD#1  Consults: None  Significant Diagnostic Studies:   Treatments: surgery: laparoscopic cholecystectomy  Discharge Exam: Blood pressure 118/82, pulse 64, temperature 98.5 F (36.9 C), temperature source Oral, resp. rate 18, height 5\' 1"  (1.549 m), weight 95.3 kg (210 lb), SpO2 100 %. General appearance: alert, cooperative and no distress Resp: clear to auscultation bilaterally Cardio: regular rate and rhythm, S1, S2 normal, no murmur, click, rub or gallop Incision/Wound:abdomen soft, obese, minimally tender, incisions clean  Disposition: 01-Home or Self Care  Discharge Instructions    Discharge patient    Complete by:  As directed    Discharge today AFTER lunch   Discharge disposition:  01-Home or Self Care   Discharge patient date:  01/19/2017     Allergies as of 01/19/2017      Reactions   Penicillins Hives   Has patient had a PCN reaction causing immediate rash, facial/tongue/throat swelling, SOB or lightheadedness with hypotension: Yes Has patient had a PCN reaction causing severe rash involving mucus membranes or skin necrosis: Yes Has patient had a PCN reaction that required hospitalization Yes Has patient had a PCN reaction occurring within the last 10 years: No If all of the above answers are "NO", then may proceed with Cephalosporin use.      Medication List    TAKE these medications   dicyclomine 20 MG tablet Commonly known as:  BENTYL Take 1 tablet (20 mg total) by mouth 2 (two) times daily.   docusate sodium 100 MG capsule Commonly known as:  COLACE Take 1 capsule (100 mg total) by  mouth every 12 (twelve) hours.   doxycycline 100 MG tablet Commonly known as:  VIBRA-TABS Take 1 tablet (100 mg total) by mouth 2 (two) times daily.   famotidine 20 MG tablet Commonly known as:  PEPCID Take 1 tablet (20 mg total) by mouth daily.   HYDROcodone-acetaminophen 5-325 MG tablet Commonly known as:  NORCO/VICODIN Take 2 tablets by mouth every 6 (six) hours as needed for severe pain.   levothyroxine 125 MCG tablet Commonly known as:  SYNTHROID, LEVOTHROID Take 125 mcg by mouth daily before breakfast.   lisinopril 5 MG tablet Commonly known as:  PRINIVIL,ZESTRIL Take 5 mg by mouth daily.   metoprolol tartrate 50 MG tablet Commonly known as:  LOPRESSOR Take 50 mg by mouth 2 (two) times daily.   ondansetron 4 MG disintegrating tablet Commonly known as:  ZOFRAN ODT Take 1 tablet (4 mg total) by mouth every 8 (eight) hours as needed for nausea or vomiting.   ondansetron 4 MG disintegrating tablet Commonly known as:  ZOFRAN ODT Take 1 tablet (4 mg total) by mouth every 8 (eight) hours as needed for nausea or vomiting.   ondansetron 4 MG tablet Commonly known as:  ZOFRAN Take 1 tablet (4 mg total) by mouth every 6 (six) hours.   oxyCODONE-acetaminophen 5-325 MG tablet Commonly known as:  PERCOCET/ROXICET Take 1-2 tablets by mouth every 4 (four) hours as needed for moderate pain.   promethazine 12.5 MG tablet Commonly known as:  PHENERGAN Take 1 tablet (12.5 mg total) by mouth every 6 (six) hours as needed for nausea or vomiting.   XARELTO 20 MG Tabs tablet  Generic drug:  rivaroxaban Take 20 mg by mouth every evening.      Follow-up Information    Coralie Keens, MD. Schedule an appointment as soon as possible for a visit in 3 week(s).   Specialty:  General Surgery Contact information: [address] [place] [address] [phone]           Signed: Harl Bowie 01/19/2017, 8:28 AM

## 2017-01-19 NOTE — Progress Notes (Signed)
1 Day Post-Op   Subjective/Chief Complaint: Pain and nausea overnight now improved    Objective: Vital signs in last 24 hours: Temp:  [97.8 F (36.6 C)-99.1 F (37.3 C)] 98.5 F (36.9 C) (05/15 0443) Pulse Rate:  [64-95] 64 (05/15 0443) Resp:  [15-23] 18 (05/15 0443) BP: (118-181)/(78-119) 118/82 (05/15 0443) SpO2:  [96 %-100 %] 100 % (05/15 0443) Last BM Date: 01/17/17  Intake/Output from previous day: 05/14 0701 - 05/15 0700 In: 2942.5 [P.O.:590; I.V.:2352.5] Out: 1475 [Urine:1450; Blood:25] Intake/Output this shift: No intake/output data recorded.  Exam: Looks comfortable Abdomen soft, minimally tender  Lab Results:   Recent Labs  01/19/17 0417  WBC 10.9*  HGB 12.3  HCT 36.3  PLT 305   BMET  Recent Labs  01/19/17 0417  NA 140  K 3.9  CL 106  CO2 25  GLUCOSE 86  BUN 5*  CREATININE 0.73  CALCIUM 8.8*   PT/INR  Recent Labs  01/18/17 0650  LABPROT 12.7  INR 0.95   ABG No results for input(s): PHART, HCO3 in the last 72 hours.  Invalid input(s): PCO2, PO2  Studies/Results: No results found.  Anti-infectives: Anti-infectives    Start     Dose/Rate Route Frequency Ordered Stop   01/18/17 0636  ciprofloxacin (CIPRO) IVPB 400 mg     400 mg 200 mL/hr over 60 Minutes Intravenous On call to O.R. 01/18/17 0636 01/18/17 0747      Assessment/Plan: s/p Procedure(s): LAPAROSCOPIC CHOLECYSTECTOMY (N/A)  D/c today after lunch  LOS: 0 days    Haiden Rawlinson A 01/19/2017

## 2017-01-20 NOTE — Anesthesia Postprocedure Evaluation (Addendum)
Anesthesia Post Note  Patient: [name]  Procedure(s) Performed: Procedure(s) (LRB): LAPAROSCOPIC CHOLECYSTECTOMY (N/A)  Patient location during evaluation: PACU Anesthesia Type: General Level of consciousness: awake and alert Pain management: pain level controlled Vital Signs Assessment: post-procedure vital signs reviewed and stable Respiratory status: spontaneous breathing, nonlabored ventilation, respiratory function stable and patient connected to nasal cannula oxygen Cardiovascular status: blood pressure returned to baseline and stable Postop Assessment: no signs of nausea or vomiting Anesthetic complications: no       Last Vitals:  Vitals:   01/19/17 0443 01/19/17 1416  BP: 118/82 134/84  Pulse: 64 (!) 55  Resp: 18 18  Temp: 36.9 C 36.8 C    Last Pain:  Vitals:   01/19/17 1416  TempSrc: Oral  PainSc:                  Lunden Stieber,JAMES TERRILL

## 2017-02-05 NOTE — Addendum Note (Signed)
Addendum  created 02/05/17 1455 by Maybell Misenheimer, MD   Sign clinical note    

## 2017-02-08 ENCOUNTER — Inpatient Hospital Stay (HOSPITAL_COMMUNITY): Admission: RE | Admit: 2017-02-08 | Payer: 59 | Source: Ambulatory Visit

## 2017-02-10 ENCOUNTER — Other Ambulatory Visit (HOSPITAL_COMMUNITY): Payer: 59

## 2017-02-23 ENCOUNTER — Encounter (HOSPITAL_COMMUNITY): Payer: Self-pay

## 2017-02-23 ENCOUNTER — Inpatient Hospital Stay (HOSPITAL_COMMUNITY)
Admission: EM | Admit: 2017-02-23 | Discharge: 2017-03-12 | DRG: 336 | Disposition: A | Discharge status: Home / Self Care | Payer: 59 | Attending: Surgery | Admitting: Surgery

## 2017-02-23 DIAGNOSIS — K565 Intestinal adhesions [bands], unspecified as to partial versus complete obstruction: Principal | ICD-10-CM | POA: Diagnosis present

## 2017-02-23 DIAGNOSIS — I10 Essential (primary) hypertension: Secondary | ICD-10-CM | POA: Diagnosis present

## 2017-02-23 DIAGNOSIS — K56609 Unspecified intestinal obstruction, unspecified as to partial versus complete obstruction: Secondary | ICD-10-CM

## 2017-02-23 DIAGNOSIS — Z880 Allergy status to penicillin: Secondary | ICD-10-CM

## 2017-02-23 DIAGNOSIS — Z452 Encounter for adjustment and management of vascular access device: Secondary | ICD-10-CM

## 2017-02-23 DIAGNOSIS — Z86711 Personal history of pulmonary embolism: Secondary | ICD-10-CM | POA: Diagnosis present

## 2017-02-23 DIAGNOSIS — Z79899 Other long term (current) drug therapy: Secondary | ICD-10-CM

## 2017-02-23 DIAGNOSIS — E876 Hypokalemia: Secondary | ICD-10-CM | POA: Diagnosis not present

## 2017-02-23 DIAGNOSIS — Z7901 Long term (current) use of anticoagulants: Secondary | ICD-10-CM

## 2017-02-23 DIAGNOSIS — K56600 Partial intestinal obstruction, unspecified as to cause: Secondary | ICD-10-CM

## 2017-02-23 DIAGNOSIS — E66813 Obesity, class 3: Secondary | ICD-10-CM | POA: Diagnosis present

## 2017-02-23 DIAGNOSIS — K567 Ileus, unspecified: Secondary | ICD-10-CM

## 2017-02-23 DIAGNOSIS — R112 Nausea with vomiting, unspecified: Secondary | ICD-10-CM

## 2017-02-23 DIAGNOSIS — R1084 Generalized abdominal pain: Secondary | ICD-10-CM | POA: Diagnosis present

## 2017-02-23 DIAGNOSIS — R1012 Left upper quadrant pain: Secondary | ICD-10-CM

## 2017-02-23 DIAGNOSIS — G8918 Other acute postprocedural pain: Secondary | ICD-10-CM | POA: Diagnosis present

## 2017-02-23 DIAGNOSIS — E039 Hypothyroidism, unspecified: Secondary | ICD-10-CM | POA: Diagnosis present

## 2017-02-23 DIAGNOSIS — Z6841 Body Mass Index (BMI) 40.0 and over, adult: Secondary | ICD-10-CM

## 2017-02-23 LAB — URINALYSIS, ROUTINE W REFLEX MICROSCOPIC
BILIRUBIN URINE: NEGATIVE
GLUCOSE, UA: NEGATIVE mg/dL
HGB URINE DIPSTICK: NEGATIVE
KETONES UR: NEGATIVE mg/dL
Leukocytes, UA: NEGATIVE
Nitrite: NEGATIVE
PROTEIN: NEGATIVE mg/dL
Specific Gravity, Urine: 1.016 (ref 1.005–1.030)
pH: 5 (ref 5.0–8.0)

## 2017-02-23 LAB — COMPREHENSIVE METABOLIC PANEL
ALK PHOS: 76 U/L (ref 38–126)
ALT: 16 U/L (ref 14–54)
AST: 19 U/L (ref 15–41)
Albumin: 4.2 g/dL (ref 3.5–5.0)
Anion gap: 9 (ref 5–15)
BUN: 6 mg/dL (ref 6–20)
CHLORIDE: 105 mmol/L (ref 101–111)
CO2: 24 mmol/L (ref 22–32)
CREATININE: 0.8 mg/dL (ref 0.44–1.00)
Calcium: 9.3 mg/dL (ref 8.9–10.3)
Glucose, Bld: 95 mg/dL (ref 65–99)
Potassium: 3.6 mmol/L (ref 3.5–5.1)
SODIUM: 138 mmol/L (ref 135–145)
Total Bilirubin: 1 mg/dL (ref 0.3–1.2)
Total Protein: 7.7 g/dL (ref 6.5–8.1)

## 2017-02-23 LAB — CBC
HCT: 40.5 % (ref 36.0–46.0)
Hemoglobin: 14.1 g/dL (ref 12.0–15.0)
MCH: 32.8 pg (ref 26.0–34.0)
MCHC: 34.8 g/dL (ref 30.0–36.0)
MCV: 94.2 fL (ref 78.0–100.0)
PLATELETS: 334 10*3/uL (ref 150–400)
RBC: 4.3 MIL/uL (ref 3.87–5.11)
RDW: 12.3 % (ref 11.5–15.5)
WBC: 5.3 10*3/uL (ref 4.0–10.5)

## 2017-02-23 LAB — LIPASE, BLOOD: Lipase: 19 U/L (ref 11–51)

## 2017-02-23 LAB — PREGNANCY, URINE: PREG TEST UR: NEGATIVE

## 2017-02-23 MED ORDER — DICYCLOMINE HCL 10 MG PO CAPS
10.0000 mg | ORAL_CAPSULE | Freq: Once | ORAL | Status: AC
  Administered 2017-02-23: 10 mg via ORAL
  Filled 2017-02-23: qty 1

## 2017-02-23 MED ORDER — MORPHINE SULFATE (PF) 4 MG/ML IV SOLN
4.0000 mg | Freq: Once | INTRAVENOUS | Status: AC
  Administered 2017-02-23: 4 mg via INTRAVENOUS
  Filled 2017-02-23: qty 1

## 2017-02-23 MED ORDER — ONDANSETRON 4 MG PO TBDP
4.0000 mg | ORAL_TABLET | Freq: Once | ORAL | Status: AC | PRN
  Administered 2017-02-23: 4 mg via ORAL

## 2017-02-23 MED ORDER — ONDANSETRON 4 MG PO TBDP: 4.0000 mg | ORAL_TABLET | Freq: Three times a day (TID) | ORAL | 0 refills | Status: DC | PRN

## 2017-02-23 MED ORDER — ONDANSETRON 4 MG PO TBDP
ORAL_TABLET | ORAL | Status: AC
  Filled 2017-02-23: qty 1

## 2017-02-23 MED ORDER — IOPAMIDOL (ISOVUE-300) INJECTION 61%
INTRAVENOUS | Status: AC
  Administered 2017-02-24: 100 mL
  Filled 2017-02-23: qty 100

## 2017-02-23 MED ORDER — SODIUM CHLORIDE 0.9 % IV BOLUS (SEPSIS)
1000.0000 mL | Freq: Once | INTRAVENOUS | Status: AC
  Administered 2017-02-23: 1000 mL via INTRAVENOUS

## 2017-02-23 MED ORDER — DICYCLOMINE HCL 20 MG PO TABS: 20.0000 mg | ORAL_TABLET | Freq: Two times a day (BID) | ORAL | 0 refills | Status: DC

## 2017-02-23 MED ORDER — IOPAMIDOL (ISOVUE-300) INJECTION 61%
INTRAVENOUS | Status: AC
  Administered 2017-02-23: 30 mL
  Filled 2017-02-23: qty 30

## 2017-02-23 MED ORDER — PROMETHAZINE HCL 25 MG/ML IJ SOLN
12.5000 mg | Freq: Once | INTRAMUSCULAR | Status: AC
  Administered 2017-02-23: 12.5 mg via INTRAVENOUS
  Filled 2017-02-23: qty 1

## 2017-02-23 NOTE — ED Provider Notes (Signed)
Emergency Department Provider Note   I have reviewed the triage vital signs and the nursing notes.   HISTORY  Chief Complaint Abdominal Pain   HPI [name] is a 36 y.o. female with PMH of gallstones s/p cholecystectomy on 01/18/2017 with Dr. [name] presents to the emergency department for evaluation of left sided abdominal pain that began today with associated vomiting. Patient endorses subjective fevers but denies diarrhea. No bloody emesis. It is nonradiating. No vaginal bleeding or discharge. No dysuria, hesitancy, urgency. She's had multiple episodes of emesis. No sick contacts. He has residual right upper quadrant soreness from her post-op recovery but not worse than normal. She attempted to eat earlier today but had vomiting shortly afterwards.    Past Medical History:  Diagnosis Date  . Complication of anesthesia    "difficulty waking up and will fight when woken up"  . Gallstones   . Hernia, ventral   . History of blood clots   . History of pulmonary embolus (PE)   . Hypertension   . Hyperthyroidism    01/15/17- in the past  . Shortness of breath dyspnea     Patient Active Problem List   Diagnosis Date Noted  . S/P laparoscopic cholecystectomy 01/18/2017  . Post op infection 05/03/2016  . Incisional hernia 04/13/2016    Past Surgical History:  Procedure Laterality Date  . BREAST SURGERY     cyst removed from right  . CESAREAN SECTION    . CHOLECYSTECTOMY  01/18/2017  . CHOLECYSTECTOMY N/A 01/18/2017   Procedure: LAPAROSCOPIC CHOLECYSTECTOMY;  Surgeon: Coralie Keens, MD;  Location: [facility];  Service: General;  Laterality: N/A;  . INCISIONAL HERNIA REPAIR  04/13/2016  . INCISIONAL HERNIA REPAIR N/A 04/13/2016   Procedure: INCISIONAL HERNIA REPAIR;  Surgeon: Coralie Keens, MD;  Location: [facility];  Service: General;  Laterality: N/A;  . INSERTION OF MESH N/A 04/13/2016   Procedure: INSERTION OF MESH;  Surgeon: Coralie Keens, MD;  Location: [facility];   Service: General;  Laterality: N/A;  . PILONIDAL CYST / SINUS EXCISION    . removal of sweat gland Bilateral    arms    Current Outpatient Rx  . Order #: [id number] Class: Print  . Order #: [id number] Class: Print  . Order #: [id number] Class: Normal  . Order #: [id number] Class: Print  . Order #: [id number] Class: Print  . Order #: [id number] Class: Historical Med  . Order #: [id number] Class: Historical Med  . Order #: [id number] Class: Historical Med  . Order #: [id number] Class: Print  . Order #: [id number] Class: Print  . Order #: [id number] Class: Print  . Order #: [id number] Class: Normal  . Order #: [id number] Class: Print  . Order #: [id number] Class: Print  . Order #: [id number] Class: Historical Med    Allergies Penicillins  Family History  Problem Relation Age of Onset  . Hypertension Other   . Hyperlipidemia Other     Social History Social History  Substance Use Topics  . Smoking status: Never Smoker  . Smokeless tobacco: Never Used  . Alcohol use No     Comment: occasionally    Review of Systems  Constitutional: No fever/chills Eyes: No visual changes. ENT: No sore throat. Cardiovascular: Denies chest pain. Respiratory: Denies shortness of breath. Gastrointestinal: Positive abdominal pain. Positive nausea and vomiting.  No diarrhea.  No constipation. Genitourinary: Negative for dysuria. Musculoskeletal: Negative for back pain. Skin: Negative for rash. Neurological: Negative for headaches, focal weakness or numbness.  10-point ROS otherwise negative.  ____________________________________________   PHYSICAL EXAM:  VITAL SIGNS:  ED Triage Vitals  Enc Vitals Group     BP 02/23/17 1747 (!) 137/104     Pulse Rate 02/23/17 1747 84     Resp 02/23/17 1747 18     Temp 02/23/17 1747 98.2 F (36.8 C)     Temp Source 02/23/17 1747 Oral     SpO2 02/23/17 1747 99 %     Weight 02/23/17 1747 210 lb (95.3 kg)     Height 02/23/17 1747 5\' 1"  (1.549 m)     Pain Score 02/23/17 1748  10   Constitutional: Alert and oriented. Appears slightly uncomfortable.  Eyes: Conjunctivae are normal. Head: Atraumatic. Nose: No congestion/rhinnorhea. Mouth/Throat: Mucous membranes are dry.  Neck: No stridor.   Cardiovascular: Normal rate, regular rhythm. Good peripheral circulation. Grossly normal heart sounds.   Respiratory: Normal respiratory effort.  No retractions. Lungs CTAB. Gastrointestinal: Soft with focal left abdominal tenderness. No rebound or guarding. Positive mild RUQ tenderness. No distention.  Musculoskeletal: No lower extremity tenderness nor edema. No gross deformities of extremities. Neurologic:  Normal speech and language. No gross focal neurologic deficits are appreciated.  Skin:  Skin is warm, dry and intact. No rash noted.  ____________________________________________   LABS (all labs ordered are listed, but only abnormal results are displayed)  Labs Reviewed  COMPREHENSIVE METABOLIC PANEL - Abnormal; Notable for the following:       Result Value   Glucose, Bld 107 (*)    BUN <5 (*)    All other components within normal limits  LIPASE, BLOOD  COMPREHENSIVE METABOLIC PANEL  CBC  URINALYSIS, ROUTINE W REFLEX MICROSCOPIC  PREGNANCY, URINE  CBC WITH DIFFERENTIAL/PLATELET  HIV ANTIBODY (ROUTINE TESTING)   ____________________________________________  RADIOLOGY  CT abdomen/pelvis pending ____________________________________________   PROCEDURES  Procedure(s) performed:   Procedures  None ____________________________________________   INITIAL IMPRESSION / ASSESSMENT AND PLAN / ED COURSE  Pertinent labs & imaging results that were available during my care of the patient were reviewed by me and considered in my medical decision making (see chart for details).  Patient resents emergency pertinent for evaluation of abdominal pain, nausea, vomiting. Symptoms are most consistent with a gastroenteritis. She does have mild left-sided abdominal  tenderness. Her presentation is complicated slightly by recent cholecystectomy on 01/18/17 with Dr. [name]. RUQ is mildly tender. Labs are unremarkable.   10:55 PM Patient with continued left-sided abdominal pain. Given her recent surgery and continued discomfort I plan for CT scan of the abdomen and pelvis to evaluate for pSBO with lower suspicion for post-op abscess/other infection. Her labs are largely unremarkable including no leukocytosis. Normal bilirubin. Treated as if viral gastroenteritis but not responding appropriately. Will reassess after CT.   Care transferred to Dr. Kathrynn Humble who will follow CT scan results and reassess.  ____________________________________________  FINAL CLINICAL IMPRESSION(S) / ED DIAGNOSES  Final diagnoses:  Left upper quadrant pain  Non-intractable vomiting with nausea, unspecified vomiting type  SBO (small bowel obstruction) (HCC)     MEDICATIONS GIVEN DURING THIS VISIT:  Medications  morphine 4 MG/ML injection 4 mg (not administered)  ondansetron (ZOFRAN-ODT) disintegrating tablet 4 mg (4 mg Oral Given 02/23/17 1752)  sodium chloride 0.9 % bolus 1,000 mL (1,000 mLs Intravenous Restarted 02/23/17 2219)  promethazine (PHENERGAN) injection 12.5 mg (12.5 mg Intravenous Given 02/23/17 2217)  dicyclomine (BENTYL) capsule 10 mg (10 mg Oral Given 02/23/17 2216)     NEW OUTPATIENT MEDICATIONS STARTED DURING THIS VISIT:  None   Note:  This document was prepared using Dragon voice recognition  software and may include unintentional dictation errors.  Nanda Quinton, MD Emergency Medicine    Long, Wonda [facility], MD 02/24/17 [phone]

## 2017-02-23 NOTE — Discharge Instructions (Signed)
We believe your symptoms are caused by either a viral infection or possible a bad food exposure.  Either way, since your symptoms have improved, we feel it is safe for you to go home and follow up with your regular doctor.  Please read the included information and stick to a bland diet for the next two days.  Drink plenty of clear fluids, and if you were provided with a prescription, please take it according to the label instructions.   ° °If you develop any new or worsening symptoms, including persistent vomiting not controlled with medication, fever greater than 101, severe or worsening abdominal pain, or other symptoms that concern you, please return immediately to the Emergency Department. ° °Viral Gastroenteritis  °Viral gastroenteritis is also known as stomach flu. This condition affects the stomach and intestinal tract. It can cause sudden diarrhea and vomiting. The illness typically lasts 3 to 8 days. Most people develop an immune response that eventually gets rid of the virus. While this natural response develops, the virus can make you quite ill.  °CAUSES  °Many different viruses can cause gastroenteritis, such as rotavirus or noroviruses. You can catch one of these viruses by consuming contaminated food or water. You may also catch a virus by sharing utensils or other personal items with an infected person or by touching a contaminated surface.  °SYMPTOMS  °The most common symptoms are diarrhea and vomiting. These problems can cause a severe loss of body fluids (dehydration) and a body salt (electrolyte) imbalance. Other symptoms may include:  °Fever.  °Headache.  °Fatigue.  °Abdominal pain. °DIAGNOSIS  °Your caregiver can usually diagnose viral gastroenteritis based on your symptoms and a physical exam. A stool sample may also be taken to test for the presence of viruses or other infections.  °TREATMENT  °This illness typically goes away on its own. Treatments are aimed at rehydration. The most serious  cases of viral gastroenteritis involve vomiting so severely that you are not able to keep fluids down. In these cases, fluids must be given through an intravenous line (IV).  °HOME CARE INSTRUCTIONS  °Drink enough fluids to keep your urine clear or pale yellow. Drink small amounts of fluids frequently and increase the amounts as tolerated.  °Ask your caregiver for specific rehydration instructions.  °Avoid:  °Foods high in sugar.  °Alcohol.  °Carbonated drinks.  °Tobacco.  °Juice.  °Caffeine drinks.  °Extremely hot or cold fluids.  °Fatty, greasy foods.  °Too much intake of anything at one time.  °Dairy products until 24 to 48 hours after diarrhea stops. °You may consume probiotics. Probiotics are active cultures of beneficial bacteria. They may lessen the amount and number of diarrheal stools in adults. Probiotics can be found in yogurt with active cultures and in supplements.  °Wash your hands well to avoid spreading the virus.  °Only take over-the-counter or prescription medicines for pain, discomfort, or fever as directed by your caregiver. Do not give aspirin to children. Antidiarrheal medicines are not recommended.  °Ask your caregiver if you should continue to take your regular prescribed and over-the-counter medicines.  °Keep all follow-up appointments as directed by your caregiver. °SEEK IMMEDIATE MEDICAL CARE IF:  °You are unable to keep fluids down.  °You do not urinate at least once every 6 to 8 hours.  °You develop shortness of breath.  °You notice blood in your stool or vomit. This may look like coffee grounds.  °You have abdominal pain that increases or is concentrated in one small area (  localized).  °You have persistent vomiting or diarrhea.  °You have a fever.  °The patient is a child younger than 3 months, and he or she has a fever.  °The patient is a child older than 3 months, and he or she has a fever and persistent symptoms.  °The patient is a child older than 3 months, and he or she has a fever  and symptoms suddenly get worse.  °The patient is a baby, and he or she has no tears when crying. °MAKE SURE YOU:  °Understand these instructions.  °Will watch your condition.  °Will get help right away if you are not doing well or get worse. °This information is not intended to replace advice given to you by your health care provider. Make sure you discuss any questions you have with your health care provider.  °Document Released: 08/24/2005 Document Revised: 11/16/2011 Document Reviewed: 06/10/2011  °Elsevier Interactive Patient Education ©2016 [employer]  ° °

## 2017-02-23 NOTE — ED Notes (Signed)
Denies fever and diarrhea.  Vomited four times in the last 24 hours.  Hx of hernia surgery x 1 year and hx of gallbladder surgery x 1 month ago.

## 2017-02-23 NOTE — ED Provider Notes (Signed)
  Physical Exam  BP (!) 147/98 (BP Location: Right Arm)   Pulse 90   Temp 98.3 F (36.8 C) (Oral)   Resp 16   Ht 5\' 1"  (1.549 m)   Wt 95.3 kg (210 lb)   SpO2 98%   BMI 39.68 kg/m   Physical Exam  ED Course  Procedures  MDM  Assuming care of patient from Dr. Laverta [place]   Patient in the ED for L sided abd pain. Workup thus far shows normal labs. Pt appears weak.  Concerning findings are as following recent hx of cholecystectomy.  Important pending results are CT abdomen.  According to Dr. Laverta [place], plan is to d/c if the CT is neg   Patient had no complains, no concerns from the nursing side. Will continue to monitor.        Varney Biles, MD 02/23/17 2330

## 2017-02-23 NOTE — ED Notes (Signed)
Patient tolerating PO fluids at this time.

## 2017-02-23 NOTE — ED Triage Notes (Signed)
Per Pt, Pt is coming from home with complaints of LLQ abdominal pain that started this morning while at work. Pt reports having nausea, vomiting, but denied diarrhea. Denies urinary symptoms.

## 2017-02-23 NOTE — ED Notes (Signed)
Patient given water to drink.  

## 2017-02-24 ENCOUNTER — Encounter (HOSPITAL_COMMUNITY): Payer: Self-pay | Admitting: Internal Medicine

## 2017-02-24 ENCOUNTER — Emergency Department (HOSPITAL_COMMUNITY): Payer: 59

## 2017-02-24 ENCOUNTER — Inpatient Hospital Stay (HOSPITAL_COMMUNITY): Payer: 59

## 2017-02-24 DIAGNOSIS — Z86711 Personal history of pulmonary embolism: Secondary | ICD-10-CM | POA: Diagnosis present

## 2017-02-24 DIAGNOSIS — G8918 Other acute postprocedural pain: Secondary | ICD-10-CM | POA: Diagnosis present

## 2017-02-24 DIAGNOSIS — K565 Intestinal adhesions [bands], unspecified as to partial versus complete obstruction: Secondary | ICD-10-CM | POA: Diagnosis present

## 2017-02-24 DIAGNOSIS — R1084 Generalized abdominal pain: Secondary | ICD-10-CM | POA: Diagnosis not present

## 2017-02-24 DIAGNOSIS — E039 Hypothyroidism, unspecified: Secondary | ICD-10-CM | POA: Diagnosis present

## 2017-02-24 DIAGNOSIS — Z79899 Other long term (current) drug therapy: Secondary | ICD-10-CM | POA: Diagnosis not present

## 2017-02-24 DIAGNOSIS — Z6841 Body Mass Index (BMI) 40.0 and over, adult: Secondary | ICD-10-CM | POA: Diagnosis not present

## 2017-02-24 DIAGNOSIS — R1012 Left upper quadrant pain: Secondary | ICD-10-CM | POA: Diagnosis not present

## 2017-02-24 DIAGNOSIS — K56609 Unspecified intestinal obstruction, unspecified as to partial versus complete obstruction: Secondary | ICD-10-CM

## 2017-02-24 DIAGNOSIS — I2699 Other pulmonary embolism without acute cor pulmonale: Secondary | ICD-10-CM | POA: Diagnosis not present

## 2017-02-24 DIAGNOSIS — R109 Unspecified abdominal pain: Secondary | ICD-10-CM | POA: Diagnosis not present

## 2017-02-24 DIAGNOSIS — Z7901 Long term (current) use of anticoagulants: Secondary | ICD-10-CM | POA: Diagnosis not present

## 2017-02-24 DIAGNOSIS — I10 Essential (primary) hypertension: Secondary | ICD-10-CM | POA: Diagnosis not present

## 2017-02-24 DIAGNOSIS — E876 Hypokalemia: Secondary | ICD-10-CM | POA: Diagnosis not present

## 2017-02-24 DIAGNOSIS — Z880 Allergy status to penicillin: Secondary | ICD-10-CM | POA: Diagnosis not present

## 2017-02-24 LAB — CBC WITH DIFFERENTIAL/PLATELET
Basophils Absolute: 0 10*3/uL (ref 0.0–0.1)
Basophils Relative: 0 %
EOS ABS: 0.1 10*3/uL (ref 0.0–0.7)
EOS PCT: 1 %
HCT: 42.1 % (ref 36.0–46.0)
Hemoglobin: 14.2 g/dL (ref 12.0–15.0)
LYMPHS PCT: 19 %
Lymphs Abs: 1.9 10*3/uL (ref 0.7–4.0)
MCH: 32.3 pg (ref 26.0–34.0)
MCHC: 33.7 g/dL (ref 30.0–36.0)
MCV: 95.7 fL (ref 78.0–100.0)
MONO ABS: 0.5 10*3/uL (ref 0.1–1.0)
Monocytes Relative: 5 %
Neutro Abs: 7.6 10*3/uL (ref 1.7–7.7)
Neutrophils Relative %: 75 %
PLATELETS: 357 10*3/uL (ref 150–400)
RBC: 4.4 MIL/uL (ref 3.87–5.11)
RDW: 12.6 % (ref 11.5–15.5)
WBC: 10 10*3/uL (ref 4.0–10.5)

## 2017-02-24 LAB — COMPREHENSIVE METABOLIC PANEL
ALT: 17 U/L (ref 14–54)
AST: 22 U/L (ref 15–41)
Albumin: 4 g/dL (ref 3.5–5.0)
Alkaline Phosphatase: 71 U/L (ref 38–126)
Anion gap: 9 (ref 5–15)
BILIRUBIN TOTAL: 1 mg/dL (ref 0.3–1.2)
CHLORIDE: 104 mmol/L (ref 101–111)
CO2: 25 mmol/L (ref 22–32)
Calcium: 9 mg/dL (ref 8.9–10.3)
Creatinine, Ser: 0.83 mg/dL (ref 0.44–1.00)
GFR calc Af Amer: >60 mL/min (ref >60)
GLUCOSE: 107 mg/dL — AB (ref 65–99)
Potassium: 3.6 mmol/L (ref 3.5–5.1)
Sodium: 138 mmol/L (ref 135–145)
TOTAL PROTEIN: 7.5 g/dL (ref 6.5–8.1)

## 2017-02-24 LAB — HIV ANTIBODY (ROUTINE TESTING W REFLEX): HIV SCREEN 4TH GENERATION: NONREACTIVE

## 2017-02-24 LAB — GLUCOSE, CAPILLARY: GLUCOSE-CAPILLARY: 106 mg/dL — AB (ref 65–99)

## 2017-02-24 MED ORDER — ONDANSETRON HCL 4 MG/2ML IJ SOLN
4.0000 mg | Freq: Four times a day (QID) | INTRAMUSCULAR | Status: DC | PRN
  Administered 2017-02-24 – 2017-03-05 (×20): 4 mg via INTRAVENOUS
  Filled 2017-02-24 (×21): qty 2

## 2017-02-24 MED ORDER — DEXTROSE-NACL 5-0.9 % IV SOLN
INTRAVENOUS | Status: DC
  Administered 2017-02-24: 06:00:00 via INTRAVENOUS

## 2017-02-24 MED ORDER — ENOXAPARIN SODIUM 40 MG/0.4ML ~~LOC~~ SOLN
40.0000 mg | SUBCUTANEOUS | Status: DC
  Administered 2017-02-24 – 2017-02-25 (×2): 40 mg via SUBCUTANEOUS
  Filled 2017-02-24 (×2): qty 0.4

## 2017-02-24 MED ORDER — ONDANSETRON HCL 4 MG/2ML IJ SOLN
4.0000 mg | Freq: Once | INTRAMUSCULAR | Status: AC
  Administered 2017-02-24: 4 mg via INTRAVENOUS
  Filled 2017-02-24: qty 2

## 2017-02-24 MED ORDER — ACETAMINOPHEN 650 MG RE SUPP: 650.0000 mg | Freq: Four times a day (QID) | RECTAL | Status: DC | PRN

## 2017-02-24 MED ORDER — POTASSIUM CHLORIDE 2 MEQ/ML IV SOLN
INTRAVENOUS | Status: DC
  Administered 2017-02-24: 17:00:00 via INTRAVENOUS
  Filled 2017-02-24 (×4): qty 1000

## 2017-02-24 MED ORDER — HYDRALAZINE HCL 20 MG/ML IJ SOLN
10.0000 mg | INTRAMUSCULAR | Status: DC | PRN
  Filled 2017-02-24: qty 1

## 2017-02-24 MED ORDER — MORPHINE SULFATE (PF) 2 MG/ML IV SOLN
2.0000 mg | INTRAVENOUS | Status: DC | PRN
  Administered 2017-02-24 (×2): 2 mg via INTRAVENOUS
  Filled 2017-02-24 (×2): qty 1

## 2017-02-24 MED ORDER — ACETAMINOPHEN 325 MG PO TABS: 650.0000 mg | ORAL_TABLET | Freq: Four times a day (QID) | ORAL | Status: DC | PRN

## 2017-02-24 MED ORDER — MORPHINE SULFATE (PF) 4 MG/ML IV SOLN
2.0000 mg | INTRAVENOUS | Status: DC | PRN
  Administered 2017-02-24 – 2017-03-01 (×16): 2 mg via INTRAVENOUS
  Filled 2017-02-24 (×18): qty 1

## 2017-02-24 MED ORDER — MORPHINE SULFATE (PF) 4 MG/ML IV SOLN
1.0000 mg | INTRAVENOUS | Status: DC | PRN
  Administered 2017-02-24: 1 mg via INTRAVENOUS
  Filled 2017-02-24: qty 1

## 2017-02-24 MED ORDER — ACETAMINOPHEN 325 MG PO TABS
650.0000 mg | ORAL_TABLET | Freq: Once | ORAL | Status: AC
  Administered 2017-02-24: 650 mg via ORAL
  Filled 2017-02-24: qty 2

## 2017-02-24 MED ORDER — METOPROLOL TARTRATE 5 MG/5ML IV SOLN
5.0000 mg | Freq: Four times a day (QID) | INTRAVENOUS | Status: DC
  Administered 2017-02-24 – 2017-03-02 (×23): 5 mg via INTRAVENOUS
  Filled 2017-02-24 (×25): qty 5

## 2017-02-24 MED ORDER — PHENOL 1.4 % MT LIQD
1.0000 | OROMUCOSAL | Status: DC | PRN
  Administered 2017-03-05: 1 via OROMUCOSAL
  Filled 2017-02-24 (×2): qty 177

## 2017-02-24 MED ORDER — LEVOTHYROXINE SODIUM 100 MCG IV SOLR
62.5000 ug | Freq: Every day | INTRAVENOUS | Status: DC
  Administered 2017-02-24 – 2017-02-27 (×4): 62.5 ug via INTRAVENOUS
  Filled 2017-02-24 (×4): qty 5

## 2017-02-24 MED ORDER — ONDANSETRON HCL 4 MG PO TABS
4.0000 mg | ORAL_TABLET | Freq: Four times a day (QID) | ORAL | Status: DC | PRN
  Administered 2017-03-02: 4 mg via ORAL
  Filled 2017-02-24: qty 1

## 2017-02-24 MED ORDER — DIATRIZOATE MEGLUMINE & SODIUM 66-10 % PO SOLN
90.0000 mL | Freq: Once | ORAL | Status: AC
  Administered 2017-02-24: 90 mL via NASOGASTRIC
  Filled 2017-02-24: qty 90

## 2017-02-24 NOTE — H&P (Signed)
History and Physical    [name] PPI:[id number] DOB: [birth year] DOA: 02/23/2017  PCP: Berkley Harvey, NP  Patient coming from: Home.  Chief Complaint: Nausea vomiting abdominal pain.  HPI: [name] is a 36 y.o. female with history of recurrent pulmonary embolism, hypertension, hypothyroidism and morbid obesity who has had recent gallbladder surgery last month presents with complaints of abdominal pain with recurrent nausea vomiting since yesterday morning. Pain was diffuse. Last bowel movement was 48 hours ago.   ED Course: In the ER CT of the abdomen and pelvis done shows features concerning for small bowel obstruction and patient was placed on NG tube suction and general surgery consulted and patient admitted for further management of small bowel obstruction.  Review of Systems: As per HPI, rest all negative.   Past Medical History:  Diagnosis Date  . Complication of anesthesia    "difficulty waking up and will fight when woken up"  . Gallstones   . Hernia, ventral   . History of blood clots   . History of pulmonary embolus (PE)   . Hypertension   . Hyperthyroidism    01/15/17- in the past  . Shortness of breath dyspnea     Past Surgical History:  Procedure Laterality Date  . BREAST SURGERY     cyst removed from right  . CESAREAN SECTION    . CHOLECYSTECTOMY  01/18/2017  . CHOLECYSTECTOMY N/A 01/18/2017   Procedure: LAPAROSCOPIC CHOLECYSTECTOMY;  Surgeon: Coralie Keens, MD;  Location: [place];  Service: General;  Laterality: N/A;  . INCISIONAL HERNIA REPAIR  04/13/2016  . INCISIONAL HERNIA REPAIR N/A 04/13/2016   Procedure: INCISIONAL HERNIA REPAIR;  Surgeon: Coralie Keens, MD;  Location: [place];  Service: General;  Laterality: N/A;  . INSERTION OF MESH N/A 04/13/2016   Procedure: INSERTION OF MESH;  Surgeon: Coralie Keens, MD;  Location: [place];  Service: General;  Laterality: N/A;  . PILONIDAL CYST / SINUS EXCISION    . removal of sweat gland Bilateral     arms     reports that she has never smoked. She has never used smokeless tobacco. She reports that she does not drink alcohol or use drugs.  Allergies  Allergen Reactions  . Penicillins Hives    Has patient had a PCN reaction causing immediate rash, facial/tongue/throat swelling, SOB or lightheadedness with hypotension: Yes Has patient had a PCN reaction causing severe rash involving mucus membranes or skin necrosis: Yes Has patient had a PCN reaction that required hospitalization Yes Has patient had a PCN reaction occurring within the last 10 years: No If all of the above answers are "NO", then may proceed with Cephalosporin use.     Family History  Problem Relation Age of Onset  . Hypertension Other   . Hyperlipidemia Other     Prior to Admission medications   Medication Sig Start Date End Date Taking? Authorizing Provider  levothyroxine (SYNTHROID, LEVOTHROID) 125 MCG tablet Take 125 mcg by mouth daily before breakfast.   Yes [provider]  lisinopril (PRINIVIL,ZESTRIL) 5 MG tablet Take 5 mg by mouth daily. 11/11/15 02/24/17 Yes [provider]  metoprolol (LOPRESSOR) 50 MG tablet Take 50 mg by mouth 2 (two) times daily. 09/12/15  Yes [provider]  Rivaroxaban (XARELTO) 20 MG TABS Take 20 mg by mouth every evening.    Yes [provider]  dicyclomine (BENTYL) 20 MG tablet Take 1 tablet (20 mg total) by mouth 2 (two) times daily. 02/23/17  Long, Wonda Olds, MD  docusate sodium (COLACE) 100 MG capsule Take 1 capsule (100 mg total) by mouth every 12 (twelve) hours. Patient not taking: Reported on 01/13/2017 09/19/16   Delos Haring, PA-C  doxycycline (VIBRA-TABS) 100 MG tablet Take 1 tablet (100 mg total) by mouth 2 (two) times daily. Patient not taking: Reported on 09/19/2016 05/10/16   Coralie Keens, MD  famotidine (PEPCID) 20 MG tablet Take 1 tablet (20 mg total) by mouth daily. Patient not taking: Reported on 01/13/2017 11/11/16   Montine Circle, PA-C  HYDROcodone-acetaminophen (NORCO/VICODIN) 5-325 MG tablet Take 2 tablets by mouth every 6 (six) hours as needed for severe pain. Patient not taking: Reported on 01/13/2017 12/19/16   Junius Creamer, NP  ondansetron [facility] ODT) 4 MG disintegrating tablet Take 1 tablet (4 mg total) by mouth every 8 (eight) hours as needed for nausea or vomiting. 02/23/17   Long, Wonda Olds, MD  ondansetron (ZOFRAN) 4 MG tablet Take 1 tablet (4 mg total) by mouth every 6 (six) hours. Patient not taking: Reported on 01/13/2017 09/19/16   Delos Haring, PA-C  ondansetron (ZOFRAN) 4 MG tablet Take 1 tablet (4 mg total) by mouth every 8 (eight) hours as needed for nausea or vomiting. Patient not taking: Reported on 02/24/2017 01/19/17   Coralie Keens, MD  oxyCODONE-acetaminophen (PERCOCET/ROXICET) 5-325 MG tablet Take 1-2 tablets by mouth every 4 (four) hours as needed for moderate pain. Patient not taking: Reported on 02/24/2017 01/19/17   Coralie Keens, MD  promethazine (PHENERGAN) 12.5 MG tablet Take 1 tablet (12.5 mg total) by mouth every 6 (six) hours as needed for nausea or vomiting. Patient not taking: Reported on 09/19/2016 04/22/16   Coralie Keens, MD    Physical Exam: Vitals:   02/24/17 0315 02/24/17 0415 02/24/17 0530 02/24/17 0539  BP: 118/75 (!) 155/98  (!) 155/93  Pulse: 77 78 86   Resp: 16  17   Temp:      TempSrc:      SpO2: 99% 97% 97%   Weight:      Height:          Constitutional: Moderately built and nourished. Vitals:   02/24/17 0315 02/24/17 0415 02/24/17 0530 02/24/17 0539  BP: 118/75 (!) 155/98  (!) 155/93  Pulse: 77 78 86   Resp: 16  17   Temp:      TempSrc:      SpO2: 99% 97% 97%   Weight:      Height:       Eyes: Anicteric no pallor. ENMT: No discharge from the ears eyes nose and mouth. Neck: No JVD appreciated no mass felt. Respiratory: No rhonchi or crepitations. Cardiovascular: S1-S2 heard and no murmurs appreciated. Abdomen: Distended bowel sounds not  appreciated. No guarding or rigidity. Musculoskeletal: No edema. No joint effusion. Skin: No rash. Skin appears warm. Neurologic: Alert awake oriented to time place and person. Moves all extremities. Psychiatric: Appears normal. Normal affect.   Labs on Admission: I have personally reviewed following labs and imaging studies  CBC:  Recent Labs Lab 02/23/17 1758  WBC 5.3  HGB 14.1  HCT 40.5  MCV 94.2  PLT 759   Basic Metabolic Panel:  Recent Labs Lab 02/23/17 1758  NA 138  K 3.6  CL 105  CO2 24  GLUCOSE 95  BUN 6  CREATININE 0.80  CALCIUM 9.3   GFR: Estimated Creatinine Clearance: 103.5 mL/min (by C-G formula based on SCr of 0.8 mg/dL). Liver Function Tests:  Recent Labs Lab  02/23/17 1758  AST 19  ALT 16  ALKPHOS 76  BILITOT 1.0  PROT 7.7  ALBUMIN 4.2    Recent Labs Lab 02/23/17 1758  LIPASE 19   No results for input(s): AMMONIA in the last 168 hours. Coagulation Profile: No results for input(s): INR, PROTIME in the last 168 hours. Cardiac Enzymes: No results for input(s): CKTOTAL, CKMB, CKMBINDEX, TROPONINI in the last 168 hours. BNP (last 3 results) No results for input(s): PROBNP in the last 8760 hours. HbA1C: No results for input(s): HGBA1C in the last 72 hours. CBG: No results for input(s): GLUCAP in the last 168 hours. Lipid Profile: No results for input(s): CHOL, HDL, LDLCALC, TRIG, CHOLHDL, LDLDIRECT in the last 72 hours. Thyroid Function Tests: No results for input(s): TSH, T4TOTAL, FREET4, T3FREE, THYROIDAB in the last 72 hours. Anemia Panel: No results for input(s): VITAMINB12, FOLATE, FERRITIN, TIBC, IRON, RETICCTPCT in the last 72 hours. Urine analysis:    Component Value Date/Time   COLORURINE YELLOW 02/23/2017 1806   APPEARANCEUR CLEAR 02/23/2017 1806   LABSPEC 1.016 02/23/2017 1806   PHURINE 5.0 02/23/2017 1806   GLUCOSEU NEGATIVE 02/23/2017 1806   HGBUR NEGATIVE 02/23/2017 1806   BILIRUBINUR NEGATIVE 02/23/2017 1806    KETONESUR NEGATIVE 02/23/2017 1806   PROTEINUR NEGATIVE 02/23/2017 1806   UROBILINOGEN 0.2 10/01/2013 1227   NITRITE NEGATIVE 02/23/2017 1806   LEUKOCYTESUR NEGATIVE 02/23/2017 1806   Sepsis Labs: @LABRCNTIP (procalcitonin:4,lacticidven:4) )No results found for this or any previous visit (from the past 240 hour(s)).   Radiological Exams on Admission: Ct Abdomen Pelvis W Contrast  Result Date: 02/24/2017 CLINICAL DATA:  Left lower quadrant abdominal pain. Nausea and vomiting. EXAM: CT ABDOMEN AND PELVIS WITH CONTRAST TECHNIQUE: Multidetector CT imaging of the abdomen and pelvis was performed using the standard protocol following bolus administration of intravenous contrast. CONTRAST:  100 cc Isovue-300 IV COMPARISON:  Abdominal CT 05/03/2016 FINDINGS: Lower chest: Linear atelectasis or scarring at the lung bases. No pleural fluid. Hepatobiliary: No focal hepatic lesion. Clips in the gallbladder fossa postcholecystectomy. No biliary dilatation. Minimal stranding in the gallbladder fossa post gallbladder surgery 5 weeks prior, an expected finding. Pancreas: No ductal dilatation or inflammation. Spleen: Normal in size without focal abnormality. Adrenals/Urinary Tract: Adrenal glands are unremarkable. Kidneys are normal, without renal calculi, focal lesion, or hydronephrosis. Bladder is unremarkable. Stomach/Bowel: Stomach physiologically distended with intraluminal contents. Dilated fluid-filled proximal small bowel with fecalization of small bowel contents. There is a transition point in the anterior mid abdomen, image 53 series 3. No pneumatosis or definite bowel wall thickening. Small bowel distal to this is decompressed. The appendix is air-filled and normal. Small to moderate colonic stool burden without bowel wall thickening. Vascular/Lymphatic: No significant vascular findings are present. No enlarged abdominal or pelvic lymph nodes. Reproductive: Uterus and bilateral adnexa are unremarkable. Other:  Postsurgical change of the anterior abdominal wall post ventral hernia repair with resolution of the previous anterior abdominal wall fluid collection. No intra-abdominal ascites. No free air or intra-abdominal abscess. Musculoskeletal: There are no acute or suspicious osseous abnormalities. IMPRESSION: Findings consistent with early small bowel obstruction with transition point in the anterior mid abdomen. Electronically Signed   By: Jeb Levering M.D.   On: 02/24/2017 01:43   Dg Abd Portable 1 View  Result Date: 02/24/2017 CLINICAL DATA:  NG tube placement. EXAM: PORTABLE ABDOMEN - 1 VIEW COMPARISON:  CT abdomen/ pelvis earlier this day. FINDINGS: Tip and side port of the enteric tube below the diaphragm in the stomach. Dilated small  gallbladder seen on recent CT. No free air. IMPRESSION: Tip and side port of the enteric tube below the diaphragm in the stomach. Electronically Signed   By: Jeb Levering M.D.   On: 02/24/2017 03:57    Assessment/Plan Principal Problem:   SBO (small bowel obstruction) (HCC) Active Problems:   Essential hypertension   History of pulmonary embolism   Hypothyroidism    1. Small bowel obstruction - Gen. surgery has been consulted patient has been placed on NG tube suction. Patient will be kept nothing by mouth IV fluids pending. Medications and serial KUBs to be followed. Further recommendations per surgery. 2. Hypertension - since patient is nothing by mouth we'll keep patient on scheduled dose of IV metoprolol and when necessary IV hydralazine. 3. History of recurrent PE - patient has not taken her xarelto for last 48 hours. In anticipation of possible surgery will hold anticoagulation for now and I have discussed this with patient and patient is agreeable. 4. Hypothyroidism - will dose IV Synthroid until patient can take orally.   DVT prophylaxis: SCDs. Code Status: Full code.  Family Communication: Discussed with patient.  Disposition Plan: Home.    Consults called: General surgery.  Admission status: Inpatient.    Rise Patience MD [facility] Pager [phone].  If 7PM-7AM, please contact night-coverage [email or web address] Password [email or web address]  02/24/2017, 5:51 AM

## 2017-02-24 NOTE — ED Provider Notes (Signed)
Has a SBO. Results from the ER workup discussed with the patient face to face and all questions answered to the best of my ability.  Dr. Kieth Brightly from Surgery made aware. Medicine to admit.   Varney Biles, MD 02/24/17 [phone]

## 2017-02-24 NOTE — ED Notes (Signed)
Gastrografin given, x-ray aware.

## 2017-02-24 NOTE — Progress Notes (Signed)
Subjective/Chief Complaint: Abdominal pain/ distention/ nausea/ vomiting  Patient not improved since NG tube placed Still feels distended Pain is mostly on her left side   Objective: Vital signs in last 24 hours: Temp:  [98.2 F (36.8 C)-99.6 F (37.6 C)] 99.6 F (37.6 C) (06/20 0615) Pulse Rate:  [77-93] 81 (06/20 0615) Resp:  [16-18] 17 (06/20 0530) BP: (116-155)/(75-105) 155/93 (06/20 0539) SpO2:  [95 %-100 %] 100 % (06/20 0615) Weight:  [95.3 kg (210 lb)-98.5 kg (217 lb 1.6 oz)] 98.5 kg (217 lb 1.6 oz) (06/20 0615) Last BM Date: 02/22/17  Intake/Output from previous day: 06/19 0701 - 06/20 0700 In: 1000 [I.V.:1000] Out: 50 [Emesis/NG output:50] Intake/Output this shift: Total I/O In: 0  Out: 200 [Urine:200]  General appearance: alert, cooperative and uncomfortable GI: obese, soft, mildly distended LLQ tenderness; no peritonitis Nasogastric tube not functioning - suction hooked to blue sump port and suction port is plugged.  Gastric fluid in sump port.  Lab Results:   Recent Labs  02/23/17 1758 02/24/17 0633  WBC 5.3 10.0  HGB 14.1 14.2  HCT 40.5 42.1  PLT 334 357   BMET  Recent Labs  02/23/17 1758 02/24/17 0633  NA 138 138  K 3.6 3.6  CL 105 104  CO2 24 25  GLUCOSE 95 107*  BUN 6 <5*  CREATININE 0.80 0.83  CALCIUM 9.3 9.0   PT/INR No results for input(s): LABPROT, INR in the last 72 hours. ABG No results for input(s): PHART, HCO3 in the last 72 hours.  Invalid input(s): PCO2, PO2  Studies/Results: Ct Abdomen Pelvis W Contrast  Result Date: 02/24/2017 CLINICAL DATA:  Left lower quadrant abdominal pain. Nausea and vomiting. EXAM: CT ABDOMEN AND PELVIS WITH CONTRAST TECHNIQUE: Multidetector CT imaging of the abdomen and pelvis was performed using the standard protocol following bolus administration of intravenous contrast. CONTRAST:  100 cc Isovue-300 IV COMPARISON:  Abdominal CT 05/03/2016 FINDINGS: Lower chest: Linear atelectasis or  scarring at the lung bases. No pleural fluid. Hepatobiliary: No focal hepatic lesion. Clips in the gallbladder fossa postcholecystectomy. No biliary dilatation. Minimal stranding in the gallbladder fossa post gallbladder surgery 5 weeks prior, an expected finding. Pancreas: No ductal dilatation or inflammation. Spleen: Normal in size without focal abnormality. Adrenals/Urinary Tract: Adrenal glands are unremarkable. Kidneys are normal, without renal calculi, focal lesion, or hydronephrosis. Bladder is unremarkable. Stomach/Bowel: Stomach physiologically distended with intraluminal contents. Dilated fluid-filled proximal small bowel with fecalization of small bowel contents. There is a transition point in the anterior mid abdomen, image 53 series 3. No pneumatosis or definite bowel wall thickening. Small bowel distal to this is decompressed. The appendix is air-filled and normal. Small to moderate colonic stool burden without bowel wall thickening. Vascular/Lymphatic: No significant vascular findings are present. No enlarged abdominal or pelvic lymph nodes. Reproductive: Uterus and bilateral adnexa are unremarkable. Other: Postsurgical change of the anterior abdominal wall post ventral hernia repair with resolution of the previous anterior abdominal wall fluid collection. No intra-abdominal ascites. No free air or intra-abdominal abscess. Musculoskeletal: There are no acute or suspicious osseous abnormalities. IMPRESSION: Findings consistent with early small bowel obstruction with transition point in the anterior mid abdomen. Electronically Signed   By: Jeb Levering M.D.   On: 02/24/2017 01:43   Dg Abd Portable 1 View  Result Date: 02/24/2017 CLINICAL DATA:  NG tube placement. EXAM: PORTABLE ABDOMEN - 1 VIEW COMPARISON:  CT abdomen/ pelvis earlier this day. FINDINGS: Tip and side port of the enteric tube below the  diaphragm in the stomach. Dilated small gallbladder seen on recent CT. No free air. IMPRESSION:  Tip and side port of the enteric tube below the diaphragm in the stomach. Electronically Signed   By: Jeb Levering M.D.   On: 02/24/2017 03:57    Anti-infectives: Anti-infectives    None      Assessment/Plan: Small bowel obstruction - likely due to adhesions NG tube needs to be flushed to clear sump port - suction now hooked up properly Will transfer to surgical floor Await follow-up films.   LOS: 0 days    Nathen Balaban K. 02/24/2017

## 2017-02-24 NOTE — Progress Notes (Signed)
Transferred pt from 3E06 to 1T86.  Pt already called family to inform them.   Gave report to receiving nurse.  Pt in no acute distress.

## 2017-02-24 NOTE — Progress Notes (Signed)
Received pt from 3E via bed, A&O x4, NGT in place, connected to LIS. Abd is soft, pt just had a pain meds prior to transfer.

## 2017-02-24 NOTE — Progress Notes (Addendum)
PROGRESS NOTE                                                                                                                                                                                                             Patient Demographics:    [name], is a 36 y.o. female, DOB - Dec 31, 1980, PVX:[id number]  Admit date - 02/23/2017   Admitting Physician Rise Patience, MD  Outpatient Primary MD for the patient is Berkley Harvey, NP  LOS - 0  Outpatient Specialists:None  Chief Complaint  Patient presents with  . Abdominal Pain       Brief Narrative   36 year old morbidly obese female with recurrent PE, hypertension, hypothyroidism with recent open gallbladder surgery one month back presented with diffuse abdominal pain with nausea and vomiting since one day duration. Patient found to have small bowel obstruction on CT abdomen in the ED. NG tube placed to wall suction and surgery consulted.   Subjective:   Reports having periumbilical pain, no nausea. Has not passed flatus or had a bowel movement.. NG tube was not functioning since suction port was plugged.   Assessment  & Plan :    Principal Problem:   SBO (small bowel obstruction) (HCC) Secondary to adhesions. NG tube first this morning and suction was hooked properly. Keep nothing by mouth. Serial abdominal exam and x-rays. Keep k>4. Supportive care with IV morphine 2 mg every 3 hours as needed and antiemetics.  Active Problems: Essential hypertension Place on scheduled IV metoprolol and when necessary IV hydralazine  History of recurrent PE Patient is off her visit also since past 48 hours. Holding anticoagulation for now.   Code Status : Full code  Family Communication  : None at bedside  Disposition Plan  : Home once improved  Barriers For Discharge : Active symptoms  Consults  :  [place] [facility]  Procedures  : CT abdomen  DVT  Prophylaxis  :  Lovenox -  Lab Results  Component Value Date   PLT 357 02/24/2017    Antibiotics  :    Anti-infectives    None        Objective:   Vitals:   02/24/17 0530 02/24/17 0539 02/24/17 0615 02/24/17 1151  BP:  (!) 155/93  (!) 137/92  Pulse: 86  81 74  Resp: 17  18  Temp:   99.6 F (37.6 C) 98.4 F (36.9 C)  TempSrc:   Oral Oral  SpO2: 97%  100% 100%  Weight:   98.5 kg (217 lb 1.6 oz)   Height:   5\' 1"  (1.549 m)     Wt Readings from Last 3 Encounters:  02/24/17 98.5 kg (217 lb 1.6 oz)  01/18/17 95.3 kg (210 lb)  01/13/17 95.3 kg (210 lb)     Intake/Output Summary (Last 24 hours) at 02/24/17 1327 Last data filed at 02/24/17 1025  Gross per 24 hour  Intake             1000 ml  Output              250 ml  Net              750 ml     Physical Exam  Gen: In distress with pain HEENT: Dry mucosa, supple neck Chest: clear b/l, no added sounds CVS: N S1&S2, no murmurs, rubs or gallop GI: soft, nondistended, bowel sounds present, diffuse abdominal tenderness Musculoskeletal: warm, no edema     Data Review:    CBC  Recent Labs Lab 02/23/17 1758 02/24/17 0633  WBC 5.3 10.0  HGB 14.1 14.2  HCT 40.5 42.1  PLT 334 357  MCV 94.2 95.7  MCH 32.8 32.3  MCHC 34.8 33.7  RDW 12.3 12.6  LYMPHSABS  --  1.9  MONOABS  --  0.5  EOSABS  --  0.1  BASOSABS  --  0.0    Chemistries   Recent Labs Lab 02/23/17 1758 02/24/17 0633  NA 138 138  K 3.6 3.6  CL 105 104  CO2 24 25  GLUCOSE 95 107*  BUN 6 <5*  CREATININE 0.80 0.83  CALCIUM 9.3 9.0  AST 19 22  ALT 16 17  ALKPHOS 76 71  BILITOT 1.0 1.0   ------------------------------------------------------------------------------------------------------------------ No results for input(s): CHOL, HDL, LDLCALC, TRIG, CHOLHDL, LDLDIRECT in the last 72 hours.  No results found for:  HGBA1C ------------------------------------------------------------------------------------------------------------------ No results for input(s): TSH, T4TOTAL, T3FREE, THYROIDAB in the last 72 hours.  Invalid input(s): FREET3 ------------------------------------------------------------------------------------------------------------------ No results for input(s): VITAMINB12, FOLATE, FERRITIN, TIBC, IRON, RETICCTPCT in the last 72 hours.  Coagulation profile No results for input(s): INR, PROTIME in the last 168 hours.  No results for input(s): DDIMER in the last 72 hours.  Cardiac Enzymes No results for input(s): CKMB, TROPONINI, MYOGLOBIN in the last 168 hours.  Invalid input(s): CK ------------------------------------------------------------------------------------------------------------------ No results found for: BNP  Inpatient Medications  Scheduled Meds: . levothyroxine  62.5 mcg Intravenous Daily  . metoprolol tartrate  5 mg Intravenous Q6H   Continuous Infusions: . dextrose 5 % and 0.9% NaCl 100 mL/hr at 02/24/17 0621   PRN Meds:.acetaminophen **OR** acetaminophen, hydrALAZINE, morphine injection, ondansetron **OR** ondansetron (ZOFRAN) IV  Micro Results No results found for this or any previous visit (from the past 240 hour(s)).  Radiology Reports Ct Abdomen Pelvis W Contrast  Result Date: 02/24/2017 CLINICAL DATA:  Left lower quadrant abdominal pain. Nausea and vomiting. EXAM: CT ABDOMEN AND PELVIS WITH CONTRAST TECHNIQUE: Multidetector CT imaging of the abdomen and pelvis was performed using the standard protocol following bolus administration of intravenous contrast. CONTRAST:  100 cc Isovue-300 IV COMPARISON:  Abdominal CT 05/03/2016 FINDINGS: Lower chest: Linear atelectasis or scarring at the lung bases. No pleural fluid. Hepatobiliary: No focal hepatic lesion. Clips in the gallbladder fossa postcholecystectomy. No biliary dilatation. Minimal stranding in the  gallbladder  fossa post gallbladder surgery 5 weeks prior, an expected finding. Pancreas: No ductal dilatation or inflammation. Spleen: Normal in size without focal abnormality. Adrenals/Urinary Tract: Adrenal glands are unremarkable. Kidneys are normal, without renal calculi, focal lesion, or hydronephrosis. Bladder is unremarkable. Stomach/Bowel: Stomach physiologically distended with intraluminal contents. Dilated fluid-filled proximal small bowel with fecalization of small bowel contents. There is a transition point in the anterior mid abdomen, image 53 series 3. No pneumatosis or definite bowel wall thickening. Small bowel distal to this is decompressed. The appendix is air-filled and normal. Small to moderate colonic stool burden without bowel wall thickening. Vascular/Lymphatic: No significant vascular findings are present. No enlarged abdominal or pelvic lymph nodes. Reproductive: Uterus and bilateral adnexa are unremarkable. Other: Postsurgical change of the anterior abdominal wall post ventral hernia repair with resolution of the previous anterior abdominal wall fluid collection. No intra-abdominal ascites. No free air or intra-abdominal abscess. Musculoskeletal: There are no acute or suspicious osseous abnormalities. IMPRESSION: Findings consistent with early small bowel obstruction with transition point in the anterior mid abdomen. Electronically Signed   By: Jeb Levering M.D.   On: 02/24/2017 01:43   Dg Abd Portable 1 View  Result Date: 02/24/2017 CLINICAL DATA:  NG tube placement. EXAM: PORTABLE ABDOMEN - 1 VIEW COMPARISON:  CT abdomen/ pelvis earlier this day. FINDINGS: Tip and side port of the enteric tube below the diaphragm in the stomach. Dilated small gallbladder seen on recent CT. No free air. IMPRESSION: Tip and side port of the enteric tube below the diaphragm in the stomach. Electronically Signed   By: Jeb Levering M.D.   On: 02/24/2017 03:57    Time Spent in minutes   20   Louellen Molder M.D on 02/24/2017 at 1:27 PM  Between 7am to 7pm - Pager - [phone]  After 7pm go to [email or web address] - password [facility]  [facility] -  Office  [phone]

## 2017-02-24 NOTE — Consult Note (Signed)
Reason for Consult: vomiting Referring Physician: Varney Biles, MD  [name] is an 36 y.o. female.  HPI: 36 yo female with history of open incisional hernia repair and lap chole presents with 1 day of abdominal pain and vomiting. She has not been able to tolerate any food today. She has crampy left lower abdominal pain. The pain does not radiate. It is constant. Her last bowel movement was yesterday. She denies fevers or chills  Past Medical History:  Diagnosis Date  . Complication of anesthesia    "difficulty waking up and will fight when woken up"  . Gallstones   . Hernia, ventral   . History of blood clots   . History of pulmonary embolus (PE)   . Hypertension   . Hyperthyroidism    01/15/17- in the past  . Shortness of breath dyspnea     Past Surgical History:  Procedure Laterality Date  . BREAST SURGERY     cyst removed from right  . CESAREAN SECTION    . CHOLECYSTECTOMY  01/18/2017  . CHOLECYSTECTOMY N/A 01/18/2017   Procedure: LAPAROSCOPIC CHOLECYSTECTOMY;  Surgeon: Coralie Keens, MD;  Location: [place];  Service: General;  Laterality: N/A;  . INCISIONAL HERNIA REPAIR  04/13/2016  . INCISIONAL HERNIA REPAIR N/A 04/13/2016   Procedure: INCISIONAL HERNIA REPAIR;  Surgeon: Coralie Keens, MD;  Location: [place];  Service: General;  Laterality: N/A;  . INSERTION OF MESH N/A 04/13/2016   Procedure: INSERTION OF MESH;  Surgeon: Coralie Keens, MD;  Location: [place];  Service: General;  Laterality: N/A;  . PILONIDAL CYST / SINUS EXCISION    . removal of sweat gland Bilateral    arms    Family History  Problem Relation Age of Onset  . Hypertension Other   . Hyperlipidemia Other     Social History:  reports that she has never smoked. She has never used smokeless tobacco. She reports that she does not drink alcohol or use drugs.  Allergies:  Allergies  Allergen Reactions  . Penicillins Hives    Has patient had a PCN reaction causing immediate rash,  facial/tongue/throat swelling, SOB or lightheadedness with hypotension: Yes Has patient had a PCN reaction causing severe rash involving mucus membranes or skin necrosis: Yes Has patient had a PCN reaction that required hospitalization Yes Has patient had a PCN reaction occurring within the last 10 years: No If all of the above answers are "NO", then may proceed with Cephalosporin use.     Medications: I have reviewed the patient's current medications.  Results for orders placed or performed during the hospital encounter of 02/23/17 (from the past 48 hour(s))  Lipase, blood     Status: None   Collection Time: 02/23/17  5:58 PM  Result Value Ref Range   Lipase 19 11 - 51 U/L  Comprehensive metabolic panel     Status: None   Collection Time: 02/23/17  5:58 PM  Result Value Ref Range   Sodium 138 135 - 145 mmol/L   Potassium 3.6 3.5 - 5.1 mmol/L   Chloride 105 101 - 111 mmol/L   CO2 24 22 - 32 mmol/L   Glucose, Bld 95 65 - 99 mg/dL   BUN 6 6 - 20 mg/dL   Creatinine, Ser 0.80 0.44 - 1.00 mg/dL   Calcium 9.3 8.9 - 10.3 mg/dL   Total Protein 7.7 6.5 - 8.1 g/dL   Albumin 4.2 3.5 - 5.0 g/dL   AST 19 15 - 41 U/L   ALT  16 14 - 54 U/L   Alkaline Phosphatase 76 38 - 126 U/L   Total Bilirubin 1.0 0.3 - 1.2 mg/dL   GFR calc non Af Amer >60 >60 mL/min   GFR calc Af Amer >60 >60 mL/min    Comment: (NOTE) The eGFR has been calculated using the CKD EPI equation. This calculation has not been validated in all clinical situations. eGFR's persistently <60 mL/min signify possible Chronic Kidney Disease.    Anion gap 9 5 - 15  CBC     Status: None   Collection Time: 02/23/17  5:58 PM  Result Value Ref Range   WBC 5.3 4.0 - 10.5 K/uL   RBC 4.30 3.87 - 5.11 MIL/uL   Hemoglobin 14.1 12.0 - 15.0 g/dL   HCT 40.5 36.0 - 46.0 %   MCV 94.2 78.0 - 100.0 fL   MCH 32.8 26.0 - 34.0 pg   MCHC 34.8 30.0 - 36.0 g/dL   RDW 12.3 11.5 - 15.5 %   Platelets 334 150 - 400 K/uL  Urinalysis, Routine w reflex  microscopic     Status: None   Collection Time: 02/23/17  6:06 PM  Result Value Ref Range   Color, Urine YELLOW YELLOW   APPearance CLEAR CLEAR   Specific Gravity, Urine 1.016 1.005 - 1.030   pH 5.0 5.0 - 8.0   Glucose, UA NEGATIVE NEGATIVE mg/dL   Hgb urine dipstick NEGATIVE NEGATIVE   Bilirubin Urine NEGATIVE NEGATIVE   Ketones, ur NEGATIVE NEGATIVE mg/dL   Protein, ur NEGATIVE NEGATIVE mg/dL   Nitrite NEGATIVE NEGATIVE   Leukocytes, UA NEGATIVE NEGATIVE  Pregnancy, urine     Status: None   Collection Time: 02/23/17  6:06 PM  Result Value Ref Range   Preg Test, Ur NEGATIVE NEGATIVE    Comment:        THE SENSITIVITY OF THIS METHODOLOGY IS >20 mIU/mL.     Ct Abdomen Pelvis W Contrast  Result Date: 02/24/2017 CLINICAL DATA:  Left lower quadrant abdominal pain. Nausea and vomiting. EXAM: CT ABDOMEN AND PELVIS WITH CONTRAST TECHNIQUE: Multidetector CT imaging of the abdomen and pelvis was performed using the standard protocol following bolus administration of intravenous contrast. CONTRAST:  100 cc Isovue-300 IV COMPARISON:  Abdominal CT 05/03/2016 FINDINGS: Lower chest: Linear atelectasis or scarring at the lung bases. No pleural fluid. Hepatobiliary: No focal hepatic lesion. Clips in the gallbladder fossa postcholecystectomy. No biliary dilatation. Minimal stranding in the gallbladder fossa post gallbladder surgery 5 weeks prior, an expected finding. Pancreas: No ductal dilatation or inflammation. Spleen: Normal in size without focal abnormality. Adrenals/Urinary Tract: Adrenal glands are unremarkable. Kidneys are normal, without renal calculi, focal lesion, or hydronephrosis. Bladder is unremarkable. Stomach/Bowel: Stomach physiologically distended with intraluminal contents. Dilated fluid-filled proximal small bowel with fecalization of small bowel contents. There is a transition point in the anterior mid abdomen, image 53 series 3. No pneumatosis or definite bowel wall thickening. Small  bowel distal to this is decompressed. The appendix is air-filled and normal. Small to moderate colonic stool burden without bowel wall thickening. Vascular/Lymphatic: No significant vascular findings are present. No enlarged abdominal or pelvic lymph nodes. Reproductive: Uterus and bilateral adnexa are unremarkable. Other: Postsurgical change of the anterior abdominal wall post ventral hernia repair with resolution of the previous anterior abdominal wall fluid collection. No intra-abdominal ascites. No free air or intra-abdominal abscess. Musculoskeletal: There are no acute or suspicious osseous abnormalities. IMPRESSION: Findings consistent with early small bowel obstruction with transition point in the anterior  mid abdomen. Electronically Signed   By: Jeb Levering M.D.   On: 02/24/2017 01:43    Review of Systems  Constitutional: Negative for chills and fever.  HENT: Negative for hearing loss.   Eyes: Negative for blurred vision and double vision.  Respiratory: Negative for cough and hemoptysis.   Cardiovascular: Negative for chest pain and palpitations.  Gastrointestinal: Positive for abdominal pain, nausea and vomiting.  Genitourinary: Negative for dysuria and urgency.  Musculoskeletal: Negative for myalgias and neck pain.  Skin: Negative for itching and rash.  Neurological: Negative for dizziness, tingling and headaches.  Endo/Heme/Allergies: Does not bruise/bleed easily.  Psychiatric/Behavioral: Negative for depression and suicidal ideas.   Blood pressure 118/75, pulse 77, temperature 98.3 F (36.8 C), temperature source Oral, resp. rate 16, height _0  (1.549 m), weight 95.3 kg (210 lb), SpO2 99 %. Physical Exam  Vitals reviewed. Constitutional: She is oriented to person, place, and time. She appears well-developed and well-nourished.  HENT:  Head: Normocephalic and atraumatic.  Eyes: Conjunctivae and EOM are normal. Pupils are equal, round, and reactive to light.  Neck: Normal  range of motion. Neck supple.  Cardiovascular: Normal rate and regular rhythm.   Respiratory: Effort normal and breath sounds normal.  GI: Soft. Bowel sounds are normal. She exhibits no distension. There is tenderness in the left lower quadrant. There is no rigidity, no rebound and no guarding.  Well healed midline scar  Musculoskeletal: Normal range of motion.  Neurological: She is alert and oriented to person, place, and time.  Skin: Skin is warm and dry.  Psychiatric: She has a normal mood and affect. Her behavior is normal.    Assessment/Plan: 36 yo female with abdominal pain, vomiting, and CT scan consistent with bowel obstruction. Given adhesions seen at last procedure this is likely adhesive in nature and since she has no cardinal signs we will proceed with nonoperative management with NG tube and small bowel protocol. We will continue to follow along.  Arta Bruce Darly Massi 02/24/2017, 3:45 AM

## 2017-02-25 ENCOUNTER — Inpatient Hospital Stay (HOSPITAL_COMMUNITY): Payer: 59

## 2017-02-25 DIAGNOSIS — K565 Intestinal adhesions [bands], unspecified as to partial versus complete obstruction: Principal | ICD-10-CM

## 2017-02-25 LAB — CBC
HEMATOCRIT: 38.2 % (ref 36.0–46.0)
Hemoglobin: 12.7 g/dL (ref 12.0–15.0)
MCH: 32.2 pg (ref 26.0–34.0)
MCHC: 33.2 g/dL (ref 30.0–36.0)
MCV: 97 fL (ref 78.0–100.0)
PLATELETS: 299 10*3/uL (ref 150–400)
RBC: 3.94 MIL/uL (ref 3.87–5.11)
RDW: 12.6 % (ref 11.5–15.5)
WBC: 6.8 10*3/uL (ref 4.0–10.5)

## 2017-02-25 LAB — GLUCOSE, CAPILLARY
GLUCOSE-CAPILLARY: 74 mg/dL (ref 65–99)
GLUCOSE-CAPILLARY: 92 mg/dL (ref 65–99)
Glucose-Capillary: 98 mg/dL (ref 65–99)

## 2017-02-25 LAB — BASIC METABOLIC PANEL
Anion gap: 6 (ref 5–15)
CO2: 25 mmol/L (ref 22–32)
CREATININE: 0.86 mg/dL (ref 0.44–1.00)
Calcium: 8.6 mg/dL — ABNORMAL LOW (ref 8.9–10.3)
Chloride: 110 mmol/L (ref 101–111)
Glucose, Bld: 79 mg/dL (ref 65–99)
POTASSIUM: 3.9 mmol/L (ref 3.5–5.1)
SODIUM: 141 mmol/L (ref 135–145)

## 2017-02-25 MED ORDER — KCL IN DEXTROSE-NACL 40-5-0.9 MEQ/L-%-% IV SOLN
INTRAVENOUS | Status: DC
  Administered 2017-02-25 – 2017-02-26 (×3): via INTRAVENOUS
  Filled 2017-02-25 (×5): qty 1000

## 2017-02-25 NOTE — Progress Notes (Signed)
PROGRESS NOTE                                                                                                                                                                                                             Patient Demographics:    [name], is a 36 y.o. female, DOB - Dec 14, 1980, DPO:[id number]  Admit date - 02/23/2017   Admitting Physician Rise Patience, MD  Outpatient Primary MD for the patient is Berkley Harvey, NP  LOS - 1  Outpatient Specialists:None  Chief Complaint  Patient presents with  . Abdominal Pain       Brief Narrative   36 year old morbidly obese female with recurrent PE, hypertension, hypothyroidism with recent open gallbladder surgery one month back presented with diffuse abdominal pain with nausea and vomiting since one day duration. Patient found to have small bowel obstruction on CT abdomen in the ED. NG tube placed to wall suction and surgery consulted.   Subjective:   Still has some abdominal pain but better from yesterday. Still has not passed flatus or had a bowel movement.  Assessment  & Plan :    Principal Problem:   SBO (small bowel obstruction) (HCC) Secondary to adhesions.Decreased NG output (400 mL in past 24 hours). Abdomen is soft and has good bowel sounds. Follow-up abdominal x-ray this morning shows oral contrast in the colon. Surgery plan on clamping the NG and try some clears. Possibly remove NG later this afternoon. IV fluids and when necessary antiemetics. Continue serial abdominal exam and when necessary pain medications.  Active Problems: Essential hypertension scheduled IV metoprolol and when necessary IV hydralazine.  History of recurrent PE Holding anticoagulation for now. Resume once started on diet.  Code Status : Full code  Family Communication  : None at bedside  Disposition Plan  : Home once improved  Barriers For Discharge : Active  symptoms  Consults  :  [place] [facility]  Procedures  : CT abdomen  DVT Prophylaxis  :  Lovenox -  Lab Results  Component Value Date   PLT 299 02/25/2017    Antibiotics  :    Anti-infectives    None        Objective:   Vitals:   02/24/17 1151 02/24/17 1701 02/24/17 2043 02/25/17 0600  BP: (!) 137/92 (!) 152/92 132/89 (!) 131/93  Pulse:  74 77 68 64  Resp: 18 17 16 16   Temp: 98.4 F (36.9 C) 97.7 F (36.5 C) 98.1 F (36.7 C) 97.9 F (36.6 C)  TempSrc: Oral Oral Oral Oral  SpO2: 100% 100% 100% 99%  Weight:      Height:        Wt Readings from Last 3 Encounters:  02/24/17 98.5 kg (217 lb 1.6 oz)  01/18/17 95.3 kg (210 lb)  01/13/17 95.3 kg (210 lb)     Intake/Output Summary (Last 24 hours) at 02/25/17 1147 Last data filed at 02/25/17 0601  Gross per 24 hour  Intake          1288.33 ml  Output              400 ml  Net           888.33 ml     Physical Exam Gen.: Middle aged female not in distress HEENT: Mucosa, supple neck, NG tube to wall  chest: Clear bilaterally neck CVS: Normal S1 and S2, no murmurs GI: Soft, nondistended, nontender, bowel sounds present  musculoskeletal: Warm, no edema    Data Review:    CBC  Recent Labs Lab 02/23/17 1758 02/24/17 0633 02/25/17 0854  WBC 5.3 10.0 6.8  HGB 14.1 14.2 12.7  HCT 40.5 42.1 38.2  PLT 334 357 299  MCV 94.2 95.7 97.0  MCH 32.8 32.3 32.2  MCHC 34.8 33.7 33.2  RDW 12.3 12.6 12.6  LYMPHSABS  --  1.9  --   MONOABS  --  0.5  --   EOSABS  --  0.1  --   BASOSABS  --  0.0  --     Chemistries   Recent Labs Lab 02/23/17 1758 02/24/17 0633 02/25/17 0854  NA 138 138 141  K 3.6 3.6 3.9  CL 105 104 110  CO2 24 25 25   GLUCOSE 95 107* 79  BUN 6 <5* <5*  CREATININE 0.80 0.83 0.86  CALCIUM 9.3 9.0 8.6*  AST 19 22  --   ALT 16 17  --   ALKPHOS 76 71  --   BILITOT 1.0 1.0  --     ------------------------------------------------------------------------------------------------------------------ No results for input(s): CHOL, HDL, LDLCALC, TRIG, CHOLHDL, LDLDIRECT in the last 72 hours.  No results found for: HGBA1C ------------------------------------------------------------------------------------------------------------------ No results for input(s): TSH, T4TOTAL, T3FREE, THYROIDAB in the last 72 hours.  Invalid input(s): FREET3 ------------------------------------------------------------------------------------------------------------------ No results for input(s): VITAMINB12, FOLATE, FERRITIN, TIBC, IRON, RETICCTPCT in the last 72 hours.  Coagulation profile No results for input(s): INR, PROTIME in the last 168 hours.  No results for input(s): DDIMER in the last 72 hours.  Cardiac Enzymes No results for input(s): CKMB, TROPONINI, MYOGLOBIN in the last 168 hours.  Invalid input(s): CK ------------------------------------------------------------------------------------------------------------------ No results found for: BNP  Inpatient Medications  Scheduled Meds: . enoxaparin (LOVENOX) injection  40 mg Subcutaneous Q24H  . levothyroxine  62.5 mcg Intravenous Daily  . metoprolol tartrate  5 mg Intravenous Q6H   Continuous Infusions: . dextrose 5 % and 0.9 % NaCl with KCl 40 mEq/L 100 mL/hr at 02/25/17 0306   PRN Meds:.acetaminophen **OR** acetaminophen, hydrALAZINE, morphine injection, ondansetron **OR** ondansetron (ZOFRAN) IV, phenol  Micro Results No results found for this or any previous visit (from the past 240 hour(s)).  Radiology Reports Dg Abdomen 1 View  Result Date: 02/24/2017 CLINICAL DATA:  Small bowel obstruction EXAM: ABDOMEN - 1 VIEW COMPARISON:  CT abdomen and pelvis February 24, 2017 FINDINGS: Supine abdomen image  obtained 8 hours after administration of Gastrografin. There is contrast throughout the large bowel and rectum. There are a  few loops of mildly dilated ileum which contain contrast. No free air. There are surgical clips the right upper quadrant region. Nasogastric tube tip in stomach. IMPRESSION: Loops of mildly dilated ileum. Suspect a degree of ileus. Contrast present throughout colon and rectum. No free air. Electronically Signed   By: Lowella Grip III M.D.   On: 02/24/2017 14:15   Ct Abdomen Pelvis W Contrast  Result Date: 02/24/2017 CLINICAL DATA:  Left lower quadrant abdominal pain. Nausea and vomiting. EXAM: CT ABDOMEN AND PELVIS WITH CONTRAST TECHNIQUE: Multidetector CT imaging of the abdomen and pelvis was performed using the standard protocol following bolus administration of intravenous contrast. CONTRAST:  100 cc Isovue-300 IV COMPARISON:  Abdominal CT 05/03/2016 FINDINGS: Lower chest: Linear atelectasis or scarring at the lung bases. No pleural fluid. Hepatobiliary: No focal hepatic lesion. Clips in the gallbladder fossa postcholecystectomy. No biliary dilatation. Minimal stranding in the gallbladder fossa post gallbladder surgery 5 weeks prior, an expected finding. Pancreas: No ductal dilatation or inflammation. Spleen: Normal in size without focal abnormality. Adrenals/Urinary Tract: Adrenal glands are unremarkable. Kidneys are normal, without renal calculi, focal lesion, or hydronephrosis. Bladder is unremarkable. Stomach/Bowel: Stomach physiologically distended with intraluminal contents. Dilated fluid-filled proximal small bowel with fecalization of small bowel contents. There is a transition point in the anterior mid abdomen, image 53 series 3. No pneumatosis or definite bowel wall thickening. Small bowel distal to this is decompressed. The appendix is air-filled and normal. Small to moderate colonic stool burden without bowel wall thickening. Vascular/Lymphatic: No significant vascular findings are present. No enlarged abdominal or pelvic lymph nodes. Reproductive: Uterus and bilateral adnexa are  unremarkable. Other: Postsurgical change of the anterior abdominal wall post ventral hernia repair with resolution of the previous anterior abdominal wall fluid collection. No intra-abdominal ascites. No free air or intra-abdominal abscess. Musculoskeletal: There are no acute or suspicious osseous abnormalities. IMPRESSION: Findings consistent with early small bowel obstruction with transition point in the anterior mid abdomen. Electronically Signed   By: Jeb Levering M.D.   On: 02/24/2017 01:43   Dg Abd 2 Views  Result Date: 02/25/2017 CLINICAL DATA:  Follow-up small bowel obstruction. History of adhesions. EXAM: ABDOMEN - 2 VIEW COMPARISON:  KUB of February 24, 2017 FINDINGS: Contrast has traversed the small bowel and now appears confined to the colon. There is some contrast in the rectum. The colon exhibits normal caliber and contour. No extraluminal contrast is observed. IMPRESSION: The orally administered contrast has cleared the small bowel and now lies within normal appearing colon and rectum. Electronically Signed   By: David  Martinique M.D.   On: 02/25/2017 08:51   Dg Abd Portable 1 View  Result Date: 02/24/2017 CLINICAL DATA:  NG tube placement. EXAM: PORTABLE ABDOMEN - 1 VIEW COMPARISON:  CT abdomen/ pelvis earlier this day. FINDINGS: Tip and side port of the enteric tube below the diaphragm in the stomach. Dilated small gallbladder seen on recent CT. No free air. IMPRESSION: Tip and side port of the enteric tube below the diaphragm in the stomach. Electronically Signed   By: Jeb Levering M.D.   On: 02/24/2017 03:57    Time Spent in minutes  25   Louellen Molder M.D on 02/25/2017 at 11:47 AM  Between 7am to 7pm - Pager - [phone]  After 7pm go to [email or web address] - password [facility]  [facility] -  Office  [phone]

## 2017-02-25 NOTE — Progress Notes (Signed)
Patient ID: [name], female   DOB: [birth year], 36 y.o.   MRN: [id number]  [facility] Surgery Progress Note     Subjective: CC- SBO Patient reports persistent intermittent sharp abdominal pains, but it is less than yesterday. She is passing flatus and had a loose BM. Denies n/v. NG tube with 400cc/24hr.  Objective: Vital signs in last 24 hours: Temp:  [97.7 F (36.5 C)-98.4 F (36.9 C)] 97.9 F (36.6 C) (06/21 0600) Pulse Rate:  [64-77] 64 (06/21 0600) Resp:  [16-18] 16 (06/21 0600) BP: (131-152)/(89-93) 131/93 (06/21 0600) SpO2:  [99 %-100 %] 99 % (06/21 0600) Last BM Date: 02/22/17  Intake/Output from previous day: 06/20 0701 - 06/21 0700 In: 1288.3 [P.O.:60; I.V.:1168.3; NG/GT:60] Out: 600 [Urine:200; Emesis/NG output:400] Intake/Output this shift: No intake/output data recorded.  PE: Gen:  Alert, NAD, pleasant HEENT: EOM's intact, pupils equal  Card:  RRR, no M/G/R heard Pulm:  CTAB, no W/R/R, effort normal Abd: well healed midline incision, soft, mild distension, +BS, no HSM, no hernia, mild tenderness left abdominal quadrants Ext:  No erythema, edema, or tenderness BUE/BLE Psych: A&Ox3  Lab Results:   Recent Labs  02/23/17 1758 02/24/17 0633  WBC 5.3 10.0  HGB 14.1 14.2  HCT 40.5 42.1  PLT 334 357   BMET  Recent Labs  02/23/17 1758 02/24/17 0633  NA 138 138  K 3.6 3.6  CL 105 104  CO2 24 25  GLUCOSE 95 107*  BUN 6 <5*  CREATININE 0.80 0.83  CALCIUM 9.3 9.0   PT/INR No results for input(s): LABPROT, INR in the last 72 hours. CMP     Component Value Date/Time   NA 138 02/24/2017 0633   K 3.6 02/24/2017 0633   CL 104 02/24/2017 0633   CO2 25 02/24/2017 0633   GLUCOSE 107 (H) 02/24/2017 0633   BUN <5 (L) 02/24/2017 0633   CREATININE 0.83 02/24/2017 0633   CALCIUM 9.0 02/24/2017 0633   PROT 7.5 02/24/2017 0633   ALBUMIN 4.0 02/24/2017 0633   AST 22 02/24/2017 0633   ALT 17 02/24/2017 0633   ALKPHOS 71 02/24/2017 0633   BILITOT 1.0 02/24/2017 0633   GFRNONAA >60 02/24/2017 0633   GFRAA >60 02/24/2017 0633   Lipase     Component Value Date/Time   LIPASE 19 02/23/2017 1758       Studies/Results: Dg Abdomen 1 View  Result Date: 02/24/2017 CLINICAL DATA:  Small bowel obstruction EXAM: ABDOMEN - 1 VIEW COMPARISON:  CT abdomen and pelvis February 24, 2017 FINDINGS: Supine abdomen image obtained 8 hours after administration of Gastrografin. There is contrast throughout the large bowel and rectum. There are a few loops of mildly dilated ileum which contain contrast. No free air. There are surgical clips the right upper quadrant region. Nasogastric tube tip in stomach. IMPRESSION: Loops of mildly dilated ileum. Suspect a degree of ileus. Contrast present throughout colon and rectum. No free air. Electronically Signed   By: Lowella Grip III M.D.   On: 02/24/2017 14:15   Ct Abdomen Pelvis W Contrast  Result Date: 02/24/2017 CLINICAL DATA:  Left lower quadrant abdominal pain. Nausea and vomiting. EXAM: CT ABDOMEN AND PELVIS WITH CONTRAST TECHNIQUE: Multidetector CT imaging of the abdomen and pelvis was performed using the standard protocol following bolus administration of intravenous contrast. CONTRAST:  100 cc Isovue-300 IV COMPARISON:  Abdominal CT 05/03/2016 FINDINGS: Lower chest: Linear atelectasis or scarring at the lung bases. No pleural fluid. Hepatobiliary: No focal hepatic lesion. Clips in  the gallbladder fossa postcholecystectomy. No biliary dilatation. Minimal stranding in the gallbladder fossa post gallbladder surgery 5 weeks prior, an expected finding. Pancreas: No ductal dilatation or inflammation. Spleen: Normal in size without focal abnormality. Adrenals/Urinary Tract: Adrenal glands are unremarkable. Kidneys are normal, without renal calculi, focal lesion, or hydronephrosis. Bladder is unremarkable. Stomach/Bowel: Stomach physiologically distended with intraluminal contents. Dilated fluid-filled proximal  small bowel with fecalization of small bowel contents. There is a transition point in the anterior mid abdomen, image 53 series 3. No pneumatosis or definite bowel wall thickening. Small bowel distal to this is decompressed. The appendix is air-filled and normal. Small to moderate colonic stool burden without bowel wall thickening. Vascular/Lymphatic: No significant vascular findings are present. No enlarged abdominal or pelvic lymph nodes. Reproductive: Uterus and bilateral adnexa are unremarkable. Other: Postsurgical change of the anterior abdominal wall post ventral hernia repair with resolution of the previous anterior abdominal wall fluid collection. No intra-abdominal ascites. No free air or intra-abdominal abscess. Musculoskeletal: There are no acute or suspicious osseous abnormalities. IMPRESSION: Findings consistent with early small bowel obstruction with transition point in the anterior mid abdomen. Electronically Signed   By: Jeb Levering M.D.   On: 02/24/2017 01:43   Dg Abd Portable 1 View  Result Date: 02/24/2017 CLINICAL DATA:  NG tube placement. EXAM: PORTABLE ABDOMEN - 1 VIEW COMPARISON:  CT abdomen/ pelvis earlier this day. FINDINGS: Tip and side port of the enteric tube below the diaphragm in the stomach. Dilated small gallbladder seen on recent CT. No free air. IMPRESSION: Tip and side port of the enteric tube below the diaphragm in the stomach. Electronically Signed   By: Jeb Levering M.D.   On: 02/24/2017 03:57    Anti-infectives: Anti-infectives    None       Assessment/Plan Small bowel obstruction  - likely due to adhesions - CT san 6/20 showed findings consistent with early small bowel obstruction with transition point in the anterior mid abdomen - XR yesterday showed contrast in the colon - XR today pending - NG with 400cc/24hr - BM x1  HTN H/o recurrent PE - on xarelto at home Hypthyroidism  ID - none FEN - IVF, clamp NG and give sips clears VTE - SCDs,  lovenox  Plan - NG with low output and bowel function returning. XR pending for today. Clamp NG tube and give sips of clear liquids from the floor. May be able to d/c NG tube later today.   LOS: 1 day    Jerrye Beavers , [facility] 02/25/2017, 7:52 AM Pager: [phone] Consults: [phone] Mon-Fri 7:00 am-4:30 pm Sat-Sun 7:00 am-11:30 am

## 2017-02-26 LAB — GLUCOSE, CAPILLARY
GLUCOSE-CAPILLARY: 70 mg/dL (ref 65–99)
Glucose-Capillary: 108 mg/dL — ABNORMAL HIGH (ref 65–99)
Glucose-Capillary: 67 mg/dL (ref 65–99)
Glucose-Capillary: 91 mg/dL (ref 65–99)

## 2017-02-26 LAB — BASIC METABOLIC PANEL
Anion gap: 9 (ref 5–15)
CALCIUM: 8.6 mg/dL — AB (ref 8.9–10.3)
CHLORIDE: 109 mmol/L (ref 101–111)
CO2: 20 mmol/L — AB (ref 22–32)
CREATININE: 0.9 mg/dL (ref 0.44–1.00)
GFR calc non Af Amer: >60 mL/min (ref >60)
GLUCOSE: 95 mg/dL (ref 65–99)
Potassium: 4.3 mmol/L (ref 3.5–5.1)
Sodium: 138 mmol/L (ref 135–145)

## 2017-02-26 MED ORDER — MORPHINE SULFATE (PF) 4 MG/ML IV SOLN
4.0000 mg | Freq: Once | INTRAVENOUS | Status: AC
  Administered 2017-02-26: 4 mg via INTRAVENOUS
  Filled 2017-02-26: qty 1

## 2017-02-26 MED ORDER — SODIUM CHLORIDE 0.45 % IV SOLN
INTRAVENOUS | Status: DC
  Administered 2017-02-26: 12:00:00 via INTRAVENOUS

## 2017-02-26 MED ORDER — DIPHENHYDRAMINE HCL 50 MG/ML IJ SOLN
25.0000 mg | Freq: Four times a day (QID) | INTRAMUSCULAR | Status: DC | PRN
  Administered 2017-02-26 – 2017-03-06 (×15): 25 mg via INTRAVENOUS
  Filled 2017-02-26 (×15): qty 1

## 2017-02-26 MED ORDER — DEXTROSE-NACL 5-0.45 % IV SOLN
INTRAVENOUS | Status: DC
  Administered 2017-02-26 – 2017-03-02 (×6): via INTRAVENOUS

## 2017-02-26 MED ORDER — ENOXAPARIN SODIUM 100 MG/ML ~~LOC~~ SOLN
1.0000 mg/kg | Freq: Two times a day (BID) | SUBCUTANEOUS | Status: DC
  Administered 2017-02-26 – 2017-03-02 (×8): 100 mg via SUBCUTANEOUS
  Filled 2017-02-26 (×8): qty 1

## 2017-02-26 NOTE — Progress Notes (Signed)
Paged MD Erlinda Hong to inform patient blood sugar 67, non symptomatic, did not receive a call back. Switched fluids back to D5 1/2 Normal Saline, per orders, and will give her a sugary snack, will recheck and monitor.

## 2017-02-26 NOTE — Progress Notes (Signed)
PROGRESS NOTE                                                                                                                                                                                                             Patient Demographics:    [name], is a 36 y.o. female, DOB - 04/07/1981, CZY:[id number]  Admit date - 02/23/2017   Admitting Physician Rise Patience, MD  Outpatient Primary MD for the patient is Berkley Harvey, NP  LOS - 2  Outpatient Specialists:None  Chief Complaint  Patient presents with  . Abdominal Pain       Brief Narrative   36 year old morbidly obese female with recurrent PE, hypertension, hypothyroidism with recent open gallbladder surgery one month back presented with diffuse abdominal pain with nausea and vomiting since one day duration. Patient found to have small bowel obstruction on CT abdomen in the ED. NG tube placed to wall suction and surgery consulted.   Subjective:   Still has some abdominal pain but better from yesterday. She is now having bm, ng removed.    Assessment  & Plan :    Principal Problem:   SBO (small bowel obstruction) ([place]) --Secondary to adhesions. --Abdomen is soft and has good bowel sounds. Follow-up abdominal x-ray on 6/22 shows oral contrast in the colon. Surgery plan on clamping the NG and try some clears.  --ng clamped on 6/21 and  Removed on 6/22, ambulate, continue ivf for now, prn  Antiemetics. Prn amnalgesics -diet advancement per general surgery -general surgery input appreciated  Active Problems: Essential hypertension Home oral meds held  scheduled IV metoprolol and when necessary IV hydralazine.  History of recurrent PE She is on therapeutic lovenox, resume oral meds once sbo resolves.   Hypothyroidism: on iv synthoid, Transition back to oral meds able to take oral reliably   Morbid obesity: Body mass index is 41.02  kg/m.   Code Status : Full code  Family Communication  : None at bedside  Disposition Plan  : Home once improved with general surgery clearance  Barriers For Discharge : Active symptoms  Consults  :  [place] surgery  Procedures  : CT abdomen  DVT Prophylaxis  :  Lovenox -  Lab Results  Component Value Date   PLT 299 02/25/2017    Antibiotics  :  Anti-infectives    None        Objective:   Vitals:   02/25/17 0600 02/25/17 1636 02/25/17 2137 02/26/17 0605  BP: (!) 131/93 128/76 (!) 150/81 (!) 136/92  Pulse: 64 76 74 64  Resp: 16   18  Temp: 97.9 F (36.6 C) 98.5 F (36.9 C) 98.5 F (36.9 C) 98.3 F (36.8 C)  TempSrc: Oral Oral Oral Oral  SpO2: 99% 100% 100% 99%  Weight:      Height:        Wt Readings from Last 3 Encounters:  02/24/17 98.5 kg (217 lb 1.6 oz)  01/18/17 95.3 kg (210 lb)  01/13/17 95.3 kg (210 lb)     Intake/Output Summary (Last 24 hours) at 02/26/17 1341 Last data filed at 02/26/17 0600  Gross per 24 hour  Intake             2720 ml  Output              200 ml  Net             2520 ml     Physical Exam Gen.: Middle aged female not in distress, obese HEENT: Mucosa, supple neck, NG tube to wall  chest: Clear bilaterally neck CVS: Normal S1 and S2, no murmurs GI: Soft, nondistended, nontender, bowel sounds present  musculoskeletal: Warm, no edema    Data Review:    CBC  Recent Labs Lab 02/23/17 1758 02/24/17 0633 02/25/17 0854  WBC 5.3 10.0 6.8  HGB 14.1 14.2 12.7  HCT 40.5 42.1 38.2  PLT 334 357 299  MCV 94.2 95.7 97.0  MCH 32.8 32.3 32.2  MCHC 34.8 33.7 33.2  RDW 12.3 12.6 12.6  LYMPHSABS  --  1.9  --   MONOABS  --  0.5  --   EOSABS  --  0.1  --   BASOSABS  --  0.0  --     Chemistries   Recent Labs Lab 02/23/17 1758 02/24/17 0633 02/25/17 0854 02/26/17 0638  NA 138 138 141 138  K 3.6 3.6 3.9 4.3  CL 105 104 110 109  CO2 24 25 25  20*  GLUCOSE 95 107* 79 95  BUN 6 <5* <5* <5*  CREATININE 0.80  0.83 0.86 0.90  CALCIUM 9.3 9.0 8.6* 8.6*  AST 19 22  --   --   ALT 16 17  --   --   ALKPHOS 76 71  --   --   BILITOT 1.0 1.0  --   --    ------------------------------------------------------------------------------------------------------------------ No results for input(s): CHOL, HDL, LDLCALC, TRIG, CHOLHDL, LDLDIRECT in the last 72 hours.  No results found for: HGBA1C ------------------------------------------------------------------------------------------------------------------ No results for input(s): TSH, T4TOTAL, T3FREE, THYROIDAB in the last 72 hours.  Invalid input(s): FREET3 ------------------------------------------------------------------------------------------------------------------ No results for input(s): VITAMINB12, FOLATE, FERRITIN, TIBC, IRON, RETICCTPCT in the last 72 hours.  Coagulation profile No results for input(s): INR, PROTIME in the last 168 hours.  No results for input(s): DDIMER in the last 72 hours.  Cardiac Enzymes No results for input(s): CKMB, TROPONINI, MYOGLOBIN in the last 168 hours.  Invalid input(s): CK ------------------------------------------------------------------------------------------------------------------ No results found for: BNP  Inpatient Medications  Scheduled Meds: . enoxaparin (LOVENOX) injection  40 mg Subcutaneous Q24H  . levothyroxine  62.5 mcg Intravenous Daily  . metoprolol tartrate  5 mg Intravenous Q6H   Continuous Infusions: . sodium chloride 100 mL/hr at 02/26/17 1217   PRN Meds:.acetaminophen **OR** acetaminophen, diphenhydrAMINE, hydrALAZINE, morphine injection, ondansetron **  OR** ondansetron (ZOFRAN) IV, phenol  Micro Results No results found for this or any previous visit (from the past 240 hour(s)).  Radiology Reports Dg Abdomen 1 View  Result Date: 02/24/2017 CLINICAL DATA:  Small bowel obstruction EXAM: ABDOMEN - 1 VIEW COMPARISON:  CT abdomen and pelvis February 24, 2017 FINDINGS: Supine abdomen  image obtained 8 hours after administration of Gastrografin. There is contrast throughout the large bowel and rectum. There are a few loops of mildly dilated ileum which contain contrast. No free air. There are surgical clips the right upper quadrant region. Nasogastric tube tip in stomach. IMPRESSION: Loops of mildly dilated ileum. Suspect a degree of ileus. Contrast present throughout colon and rectum. No free air. Electronically Signed   By: Lowella Grip III M.D.   On: 02/24/2017 14:15   Ct Abdomen Pelvis W Contrast  Result Date: 02/24/2017 CLINICAL DATA:  Left lower quadrant abdominal pain. Nausea and vomiting. EXAM: CT ABDOMEN AND PELVIS WITH CONTRAST TECHNIQUE: Multidetector CT imaging of the abdomen and pelvis was performed using the standard protocol following bolus administration of intravenous contrast. CONTRAST:  100 cc Isovue-300 IV COMPARISON:  Abdominal CT 05/03/2016 FINDINGS: Lower chest: Linear atelectasis or scarring at the lung bases. No pleural fluid. Hepatobiliary: No focal hepatic lesion. Clips in the gallbladder fossa postcholecystectomy. No biliary dilatation. Minimal stranding in the gallbladder fossa post gallbladder surgery 5 weeks prior, an expected finding. Pancreas: No ductal dilatation or inflammation. Spleen: Normal in size without focal abnormality. Adrenals/Urinary Tract: Adrenal glands are unremarkable. Kidneys are normal, without renal calculi, focal lesion, or hydronephrosis. Bladder is unremarkable. Stomach/Bowel: Stomach physiologically distended with intraluminal contents. Dilated fluid-filled proximal small bowel with fecalization of small bowel contents. There is a transition point in the anterior mid abdomen, image 53 series 3. No pneumatosis or definite bowel wall thickening. Small bowel distal to this is decompressed. The appendix is air-filled and normal. Small to moderate colonic stool burden without bowel wall thickening. Vascular/Lymphatic: No significant  vascular findings are present. No enlarged abdominal or pelvic lymph nodes. Reproductive: Uterus and bilateral adnexa are unremarkable. Other: Postsurgical change of the anterior abdominal wall post ventral hernia repair with resolution of the previous anterior abdominal wall fluid collection. No intra-abdominal ascites. No free air or intra-abdominal abscess. Musculoskeletal: There are no acute or suspicious osseous abnormalities. IMPRESSION: Findings consistent with early small bowel obstruction with transition point in the anterior mid abdomen. Electronically Signed   By: Jeb Levering M.D.   On: 02/24/2017 01:43   Dg Abd 2 Views  Result Date: 02/25/2017 CLINICAL DATA:  Follow-up small bowel obstruction. History of adhesions. EXAM: ABDOMEN - 2 VIEW COMPARISON:  KUB of February 24, 2017 FINDINGS: Contrast has traversed the small bowel and now appears confined to the colon. There is some contrast in the rectum. The colon exhibits normal caliber and contour. No extraluminal contrast is observed. IMPRESSION: The orally administered contrast has cleared the small bowel and now lies within normal appearing colon and rectum. Electronically Signed   By: David  Martinique M.D.   On: 02/25/2017 08:51   Dg Abd Portable 1 View  Result Date: 02/24/2017 CLINICAL DATA:  NG tube placement. EXAM: PORTABLE ABDOMEN - 1 VIEW COMPARISON:  CT abdomen/ pelvis earlier this day. FINDINGS: Tip and side port of the enteric tube below the diaphragm in the stomach. Dilated small gallbladder seen on recent CT. No free air. IMPRESSION: Tip and side port of the enteric tube below the diaphragm in the stomach. Electronically Signed  By: Jeb Levering M.D.   On: 02/24/2017 03:57    Time Spent in minutes  59   Tahjanae Blankenburg M.D  PhD on 02/26/2017 at 1:41 PM  Between 7am to 7pm - Pager - [phone]  After 7pm go to [email or web address] - password [facility]  [facility] -  Office  [phone]

## 2017-02-26 NOTE — Progress Notes (Signed)
Patient ID: [name], female   DOB: 02-21-1981, 36 y.o.   MRN: [id number]  [facility] Surgery Progress Note     Subjective: CC- SBO, abdominal pain Patient states that she got nauseated yesterday when drinking gingerale. Did well with water. Denies any current nausea. She does still have some intermittent abdominal pain, but abdominal distension is less. She had 4 loose BM's yesterday.  Objective: Vital signs in last 24 hours: Temp:  [98.3 F (36.8 C)-98.5 F (36.9 C)] 98.3 F (36.8 C) (06/22 0605) Pulse Rate:  [64-76] 64 (06/22 0605) Resp:  [18] 18 (06/22 0605) BP: (128-150)/(76-92) 136/92 (06/22 0605) SpO2:  [99 %-100 %] 99 % (06/22 0605) Last BM Date: 02/25/17  Intake/Output from previous day: 06/21 0701 - 06/22 0700 In: 2942 [P.O.:342; I.V.:2600] Out: 200 [Emesis/NG output:200] Intake/Output this shift: No intake/output data recorded.  PE: Gen:  Alert, NAD, pleasant HEENT: EOM's intact, pupils equal  Card:  RRR, no M/G/R heard Pulm:  CTAB, no W/R/R, effort normal Abd: well healed midline incision, soft, nondistended, +BS in all 4 quadrants, no HSM, no hernia, nontender Ext:  No erythema, edema, or tenderness BUE/BLE Psych: A&Ox3  Lab Results:   Recent Labs  02/24/17 0633 02/25/17 0854  WBC 10.0 6.8  HGB 14.2 12.7  HCT 42.1 38.2  PLT 357 299   BMET  Recent Labs  02/24/17 0633 02/25/17 0854  NA 138 141  K 3.6 3.9  CL 104 110  CO2 25 25  GLUCOSE 107* 79  BUN <5* <5*  CREATININE 0.83 0.86  CALCIUM 9.0 8.6*   PT/INR No results for input(s): LABPROT, INR in the last 72 hours. CMP     Component Value Date/Time   NA 141 02/25/2017 0854   K 3.9 02/25/2017 0854   CL 110 02/25/2017 0854   CO2 25 02/25/2017 0854   GLUCOSE 79 02/25/2017 0854   BUN <5 (L) 02/25/2017 0854   CREATININE 0.86 02/25/2017 0854   CALCIUM 8.6 (L) 02/25/2017 0854   PROT 7.5 02/24/2017 0633   ALBUMIN 4.0 02/24/2017 0633   AST 22 02/24/2017 0633   ALT 17  02/24/2017 0633   ALKPHOS 71 02/24/2017 0633   BILITOT 1.0 02/24/2017 0633   GFRNONAA >60 02/25/2017 0854   GFRAA >60 02/25/2017 0854   Lipase     Component Value Date/Time   LIPASE 19 02/23/2017 1758       Studies/Results: Dg Abdomen 1 View  Result Date: 02/24/2017 CLINICAL DATA:  Small bowel obstruction EXAM: ABDOMEN - 1 VIEW COMPARISON:  CT abdomen and pelvis February 24, 2017 FINDINGS: Supine abdomen image obtained 8 hours after administration of Gastrografin. There is contrast throughout the large bowel and rectum. There are a few loops of mildly dilated ileum which contain contrast. No free air. There are surgical clips the right upper quadrant region. Nasogastric tube tip in stomach. IMPRESSION: Loops of mildly dilated ileum. Suspect a degree of ileus. Contrast present throughout colon and rectum. No free air. Electronically Signed   By: Lowella Grip III M.D.   On: 02/24/2017 14:15   Dg Abd 2 Views  Result Date: 02/25/2017 CLINICAL DATA:  Follow-up small bowel obstruction. History of adhesions. EXAM: ABDOMEN - 2 VIEW COMPARISON:  KUB of February 24, 2017 FINDINGS: Contrast has traversed the small bowel and now appears confined to the colon. There is some contrast in the rectum. The colon exhibits normal caliber and contour. No extraluminal contrast is observed. IMPRESSION: The orally administered contrast has cleared the  small bowel and now lies within normal appearing colon and rectum. Electronically Signed   By: David  Martinique M.D.   On: 02/25/2017 08:51    Anti-infectives: Anti-infectives    None       Assessment/Plan Small bowel obstruction  - likely due to adhesions - CT san 6/20 showed findings consistent with early small bowel obstruction with transition point in the anterior mid abdomen - XR 6/20 showed contrast in the colon - BM x4  HTN H/o recurrent PE - on xarelto at home Hypthyroidism  ID - none FEN - IVF, full liquids VTE - SCDs, lovenox  Plan -  patient had 4 BM's yesterday. D/c NG tube and advance to full liquids. Encourage ambulation.   LOS: 2 days    Jerrye Beavers , [facility] 02/26/2017, 8:21 AM Pager: [phone] Consults: [phone] Mon-Fri 7:00 am-4:30 pm Sat-Sun 7:00 am-11:30 am

## 2017-02-26 NOTE — Progress Notes (Signed)
Patient stating her IV burning, assessed IV and does not look infiltrated, so I thought could be potassium. Her potassium level was normal this morning so I asked PA Sabra Heck if we could change her fluids she said I could place order for 0.45 normal saline.

## 2017-02-27 LAB — BASIC METABOLIC PANEL
Anion gap: 7 (ref 5–15)
CO2: 24 mmol/L (ref 22–32)
Calcium: 8.5 mg/dL — ABNORMAL LOW (ref 8.9–10.3)
Chloride: 104 mmol/L (ref 101–111)
Creatinine, Ser: 0.86 mg/dL (ref 0.44–1.00)
GFR calc Af Amer: >60 mL/min (ref >60)
GLUCOSE: 94 mg/dL (ref 65–99)
POTASSIUM: 3.3 mmol/L — AB (ref 3.5–5.1)
Sodium: 135 mmol/L (ref 135–145)

## 2017-02-27 LAB — GLUCOSE, CAPILLARY
GLUCOSE-CAPILLARY: 94 mg/dL (ref 65–99)
GLUCOSE-CAPILLARY: 95 mg/dL (ref 65–99)
GLUCOSE-CAPILLARY: 93 mg/dL (ref 65–99)

## 2017-02-27 LAB — TSH: TSH: 11.412 u[IU]/mL — ABNORMAL HIGH (ref 0.350–4.500)

## 2017-02-27 LAB — MAGNESIUM: Magnesium: 1.9 mg/dL (ref 1.7–2.4)

## 2017-02-27 MED ORDER — LEVOTHYROXINE SODIUM 25 MCG PO TABS
125.0000 ug | ORAL_TABLET | Freq: Every day | ORAL | Status: DC
  Administered 2017-02-28 – 2017-03-03 (×4): 125 ug via ORAL
  Filled 2017-02-27 (×4): qty 1

## 2017-02-27 MED ORDER — POTASSIUM CHLORIDE CRYS ER 20 MEQ PO TBCR
40.0000 meq | EXTENDED_RELEASE_TABLET | Freq: Once | ORAL | Status: AC
  Administered 2017-02-27: 40 meq via ORAL
  Filled 2017-02-27: qty 2

## 2017-02-27 MED ORDER — OXYCODONE HCL 5 MG PO TABS
5.0000 mg | ORAL_TABLET | ORAL | Status: DC | PRN
  Administered 2017-02-27 – 2017-03-04 (×16): 10 mg via ORAL
  Filled 2017-02-27 (×17): qty 2

## 2017-02-27 MED ORDER — FAMOTIDINE 20 MG PO TABS
20.0000 mg | ORAL_TABLET | Freq: Every day | ORAL | Status: DC
  Administered 2017-02-27 – 2017-03-03 (×5): 20 mg via ORAL
  Filled 2017-02-27 (×5): qty 1

## 2017-02-27 MED ORDER — IOPAMIDOL (ISOVUE-300) INJECTION 61%
INTRAVENOUS | Status: AC
  Administered 2017-02-27: 90 mL
  Filled 2017-02-27: qty 100

## 2017-02-27 NOTE — Progress Notes (Signed)
PROGRESS NOTE                                                                                                                                                                                                             Patient Demographics:    [name], is a 36 y.o. female, DOB - Dec 29, 1980, VHQ:[id number]  Admit date - 02/23/2017   Admitting Physician Rise Patience, MD  Outpatient Primary MD for the patient is Berkley Harvey, NP  LOS - 3  Outpatient Specialists:None  Chief Complaint  Patient presents with  . Abdominal Pain       Brief Narrative   36 year old morbidly obese female with recurrent PE, hypertension, hypothyroidism with recent open gallbladder surgery one month back presented with diffuse abdominal pain with nausea and vomiting since one day duration. Patient found to have small bowel obstruction on CT abdomen in the ED. NG tube placed to wall suction and surgery consulted.   Subjective:   Passing gas and having bm, but remain has abdominal cramping "every 23mins " not able to tolerate diet, no vomiting, no fever, Family in room    Assessment  & Plan :    Principal Problem:   SBO (small bowel obstruction) ([place]) --Secondary to adhesions. --Abdomen is soft and has good bowel sounds. Follow-up abdominal x-ray on 6/22 shows oral contrast in the colon.   --ng clamped on 6/21 and  Removed on 6/22, ambulate, continue ivf for now, prn  Antiemetics. Prn amnalgesics -thought having bm, she still have abdominal cramping, not able to tolerate diet, general surgery plan for UGI with small bowel follow-thru, will follow general surgery recommendations, diet advancement per general surgery   Hypokalemia: replace k  Active Problems: Essential hypertension Home oral meds held  scheduled IV metoprolol and when necessary IV hydralazine.  History of recurrent PE She is on therapeutic lovenox, resume oral  meds once sbo resolves.   Hypothyroidism:  She is started on iv synthoid due to npo, Transition back to oral meds able to take oral reliably   Morbid obesity: Body mass index is 41.02 kg/m.   Code Status : Full code  Family Communication  : multiple family members at bedside  Disposition Plan  : Home once improved with general surgery clearance  Barriers For Discharge : Active symptoms  Consults  :  [place] surgery  Procedures  : CT abdomen  DVT Prophylaxis  :  Lovenox -  Lab Results  Component Value Date   PLT 299 02/25/2017    Antibiotics  :    Anti-infectives    None        Objective:   Vitals:   02/26/17 2131 02/27/17 0502 02/27/17 1331 02/27/17 1411  BP: (!) 156/94 (!) 108/57 (!) 128/94 120/74  Pulse: 73 65 64 64  Resp: 18 17  18   Temp: 98.7 F (37.1 C) 98.5 F (36.9 C)  98 F (36.7 C)  TempSrc: Oral Oral  Oral  SpO2: 100% 100%  99%  Weight:      Height:        Wt Readings from Last 3 Encounters:  02/24/17 98.5 kg (217 lb 1.6 oz)  01/18/17 95.3 kg (210 lb)  01/13/17 95.3 kg (210 lb)     Intake/Output Summary (Last 24 hours) at 02/27/17 1606 Last data filed at 02/27/17 1414  Gross per 24 hour  Intake              880 ml  Output                0 ml  Net              880 ml     Physical Exam Gen.: Middle aged female not in distress, obese HEENT: Mucosa, supple neck, NG tube to wall  chest: Clear bilaterally neck CVS: Normal S1 and S2, no murmurs GI: Soft, nondistended, nontender, bowel sounds present  musculoskeletal: Warm, no edema    Data Review:    CBC  Recent Labs Lab 02/23/17 1758 02/24/17 0633 02/25/17 0854  WBC 5.3 10.0 6.8  HGB 14.1 14.2 12.7  HCT 40.5 42.1 38.2  PLT 334 357 299  MCV 94.2 95.7 97.0  MCH 32.8 32.3 32.2  MCHC 34.8 33.7 33.2  RDW 12.3 12.6 12.6  LYMPHSABS  --  1.9  --   MONOABS  --  0.5  --   EOSABS  --  0.1  --   BASOSABS  --  0.0  --     Chemistries   Recent Labs Lab 02/23/17 1758  02/24/17 0633 02/25/17 0854 02/26/17 0638 02/27/17 0537  NA 138 138 141 138 135  K 3.6 3.6 3.9 4.3 3.3*  CL 105 104 110 109 104  CO2 24 25 25  20* 24  GLUCOSE 95 107* 79 95 94  BUN 6 <5* <5* <5* <5*  CREATININE 0.80 0.83 0.86 0.90 0.86  CALCIUM 9.3 9.0 8.6* 8.6* 8.5*  MG  --   --   --   --  1.9  AST 19 22  --   --   --   ALT 16 17  --   --   --   ALKPHOS 76 71  --   --   --   BILITOT 1.0 1.0  --   --   --    ------------------------------------------------------------------------------------------------------------------ No results for input(s): CHOL, HDL, LDLCALC, TRIG, CHOLHDL, LDLDIRECT in the last 72 hours.  No results found for: HGBA1C ------------------------------------------------------------------------------------------------------------------  Recent Labs  02/27/17 0537  TSH 11.412*   ------------------------------------------------------------------------------------------------------------------ No results for input(s): VITAMINB12, FOLATE, FERRITIN, TIBC, IRON, RETICCTPCT in the last 72 hours.  Coagulation profile No results for input(s): INR, PROTIME in the last 168 hours.  No results for input(s): DDIMER in the last 72 hours.  Cardiac Enzymes No results for input(s): CKMB, TROPONINI, MYOGLOBIN in  the last 168 hours.  Invalid input(s): CK ------------------------------------------------------------------------------------------------------------------ No results found for: BNP  Inpatient Medications  Scheduled Meds: . enoxaparin (LOVENOX) injection  1 mg/kg Subcutaneous Q12H  . famotidine  20 mg Oral Daily  . [START ON 02/28/2017] levothyroxine  125 mcg Oral QAC breakfast  . metoprolol tartrate  5 mg Intravenous Q6H   Continuous Infusions: . dextrose 5 % and 0.45% NaCl 75 mL/hr at 02/27/17 1329   PRN Meds:.acetaminophen **OR** acetaminophen, diphenhydrAMINE, hydrALAZINE, morphine injection, ondansetron **OR** ondansetron (ZOFRAN) IV, oxyCODONE,  phenol  Micro Results No results found for this or any previous visit (from the past 240 hour(s)).  Radiology Reports Dg Abdomen 1 View  Result Date: 02/24/2017 CLINICAL DATA:  Small bowel obstruction EXAM: ABDOMEN - 1 VIEW COMPARISON:  CT abdomen and pelvis February 24, 2017 FINDINGS: Supine abdomen image obtained 8 hours after administration of Gastrografin. There is contrast throughout the large bowel and rectum. There are a few loops of mildly dilated ileum which contain contrast. No free air. There are surgical clips the right upper quadrant region. Nasogastric tube tip in stomach. IMPRESSION: Loops of mildly dilated ileum. Suspect a degree of ileus. Contrast present throughout colon and rectum. No free air. Electronically Signed   By: Lowella Grip III M.D.   On: 02/24/2017 14:15   Ct Abdomen Pelvis W Contrast  Result Date: 02/24/2017 CLINICAL DATA:  Left lower quadrant abdominal pain. Nausea and vomiting. EXAM: CT ABDOMEN AND PELVIS WITH CONTRAST TECHNIQUE: Multidetector CT imaging of the abdomen and pelvis was performed using the standard protocol following bolus administration of intravenous contrast. CONTRAST:  100 cc Isovue-300 IV COMPARISON:  Abdominal CT 05/03/2016 FINDINGS: Lower chest: Linear atelectasis or scarring at the lung bases. No pleural fluid. Hepatobiliary: No focal hepatic lesion. Clips in the gallbladder fossa postcholecystectomy. No biliary dilatation. Minimal stranding in the gallbladder fossa post gallbladder surgery 5 weeks prior, an expected finding. Pancreas: No ductal dilatation or inflammation. Spleen: Normal in size without focal abnormality. Adrenals/Urinary Tract: Adrenal glands are unremarkable. Kidneys are normal, without renal calculi, focal lesion, or hydronephrosis. Bladder is unremarkable. Stomach/Bowel: Stomach physiologically distended with intraluminal contents. Dilated fluid-filled proximal small bowel with fecalization of small bowel contents. There is a  transition point in the anterior mid abdomen, image 53 series 3. No pneumatosis or definite bowel wall thickening. Small bowel distal to this is decompressed. The appendix is air-filled and normal. Small to moderate colonic stool burden without bowel wall thickening. Vascular/Lymphatic: No significant vascular findings are present. No enlarged abdominal or pelvic lymph nodes. Reproductive: Uterus and bilateral adnexa are unremarkable. Other: Postsurgical change of the anterior abdominal wall post ventral hernia repair with resolution of the previous anterior abdominal wall fluid collection. No intra-abdominal ascites. No free air or intra-abdominal abscess. Musculoskeletal: There are no acute or suspicious osseous abnormalities. IMPRESSION: Findings consistent with early small bowel obstruction with transition point in the anterior mid abdomen. Electronically Signed   By: Jeb Levering M.D.   On: 02/24/2017 01:43   Dg Abd 2 Views  Result Date: 02/25/2017 CLINICAL DATA:  Follow-up small bowel obstruction. History of adhesions. EXAM: ABDOMEN - 2 VIEW COMPARISON:  KUB of February 24, 2017 FINDINGS: Contrast has traversed the small bowel and now appears confined to the colon. There is some contrast in the rectum. The colon exhibits normal caliber and contour. No extraluminal contrast is observed. IMPRESSION: The orally administered contrast has cleared the small bowel and now lies within normal appearing colon and rectum. Electronically Signed  By: David  Martinique M.D.   On: 02/25/2017 08:51   Dg Abd Portable 1 View  Result Date: 02/24/2017 CLINICAL DATA:  NG tube placement. EXAM: PORTABLE ABDOMEN - 1 VIEW COMPARISON:  CT abdomen/ pelvis earlier this day. FINDINGS: Tip and side port of the enteric tube below the diaphragm in the stomach. Dilated small gallbladder seen on recent CT. No free air. IMPRESSION: Tip and side port of the enteric tube below the diaphragm in the stomach. Electronically Signed   By: Jeb Levering M.D.   On: 02/24/2017 03:57    Time Spent in minutes  37   Eshaal Duby M.D  PhD on 02/27/2017 at 4:06 PM  Between 7am to 7pm - Pager - [phone]  After 7pm go to [email or web address] - password [facility]  [facility] -  Office  [phone]

## 2017-02-27 NOTE — Progress Notes (Signed)
Wasted 2mg /0.70ml Morphine in sharps witnessed with Tracie Harrier, Agricultural consultant

## 2017-02-27 NOTE — Progress Notes (Signed)
[facility]/Trauma Progress Note      Subjective:  CC: Abdominal pain with food  Patient denies abdominal pain at rest. She has had 3 runny BMs since yesterday morning. She states when she drinks juice or tries to eat food she has pain in her mid abdomen. She denies vomiting or fever. Patient is still having flatus. No acute events overnight.  Objective: Vital signs in last 24 hours: Temp:  [98.5 F (36.9 C)-98.7 F (37.1 C)] 98.5 F (36.9 C) (06/23 0502) Pulse Rate:  [65-73] 65 (06/23 0502) Resp:  [17-18] 17 (06/23 0502) BP: (108-156)/(57-94) 108/57 (06/23 0502) SpO2:  [100 %] 100 % (06/23 0502) Last BM Date: 02/26/17  Intake/Output from previous day: 06/22 0701 - 06/23 0700 In: 320 [P.O.:120; I.V.:200] Out: -  Intake/Output this shift: No intake/output data recorded.  PE: Gen:  Alert, NAD, pleasant, cooperative Card:  RRR, no M/G/R heard Pulm:  Rate effort normal Abd: Soft, well healed midline incision, nondistended, +BS, no hernias appreciated, very mild tenderness in the epigastric region Skin: no rashes noted, warm and dry  Lab Results:   Recent Labs  02/25/17 0854  WBC 6.8  HGB 12.7  HCT 38.2  PLT 299   BMET  Recent Labs  02/26/17 0638 02/27/17 0537  NA 138 135  K 4.3 3.3*  CL 109 104  CO2 20* 24  GLUCOSE 95 94  BUN <5* <5*  CREATININE 0.90 0.86  CALCIUM 8.6* 8.5*   PT/INR No results for input(s): LABPROT, INR in the last 72 hours. CMP     Component Value Date/Time   NA 135 02/27/2017 0537   K 3.3 (L) 02/27/2017 0537   CL 104 02/27/2017 0537   CO2 24 02/27/2017 0537   GLUCOSE 94 02/27/2017 0537   BUN <5 (L) 02/27/2017 0537   CREATININE 0.86 02/27/2017 0537   CALCIUM 8.5 (L) 02/27/2017 0537   PROT 7.5 02/24/2017 0633   ALBUMIN 4.0 02/24/2017 0633   AST 22 02/24/2017 0633   ALT 17 02/24/2017 0633   ALKPHOS 71 02/24/2017 0633   BILITOT 1.0 02/24/2017 0633   GFRNONAA >60 02/27/2017 0537   GFRAA >60 02/27/2017 0537    Lipase     Component Value Date/Time   LIPASE 19 02/23/2017 1758    Studies/Results: Dg Abd 2 Views  Result Date: 02/25/2017 CLINICAL DATA:  Follow-up small bowel obstruction. History of adhesions. EXAM: ABDOMEN - 2 VIEW COMPARISON:  KUB of February 24, 2017 FINDINGS: Contrast has traversed the small bowel and now appears confined to the colon. There is some contrast in the rectum. The colon exhibits normal caliber and contour. No extraluminal contrast is observed. IMPRESSION: The orally administered contrast has cleared the small bowel and now lies within normal appearing colon and rectum. Electronically Signed   By: David  Martinique M.D.   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 Encourage ambulation. Unsure why patient is having midabdominal pain with eating but do not feel it is related to bowel obstruction as patient  is having adequate bowel function.   LOS: 3 days    Kalman Drape , [facility] 02/27/2017, 8:21 AM Pager: [phone] Consults: [phone] Mon-Fri 7:00 am-4:30 pm Sat-Sun 7:00 am-11:30 am

## 2017-02-28 ENCOUNTER — Inpatient Hospital Stay (HOSPITAL_COMMUNITY): Payer: 59

## 2017-02-28 DIAGNOSIS — Z86711 Personal history of pulmonary embolism: Secondary | ICD-10-CM

## 2017-02-28 DIAGNOSIS — E876 Hypokalemia: Secondary | ICD-10-CM

## 2017-02-28 LAB — CBC
HCT: 35.3 % — ABNORMAL LOW (ref 36.0–46.0)
Hemoglobin: 11.9 g/dL — ABNORMAL LOW (ref 12.0–15.0)
MCH: 32.2 pg (ref 26.0–34.0)
MCHC: 33.7 g/dL (ref 30.0–36.0)
MCV: 95.4 fL (ref 78.0–100.0)
Platelets: 291 10*3/uL (ref 150–400)
RBC: 3.7 MIL/uL — ABNORMAL LOW (ref 3.87–5.11)
RDW: 12.3 % (ref 11.5–15.5)
WBC: 6.1 10*3/uL (ref 4.0–10.5)

## 2017-02-28 LAB — BASIC METABOLIC PANEL
Anion gap: 6 (ref 5–15)
BUN: <5 mg/dL — ABNORMAL LOW (ref 6–20)
CO2: 24 mmol/L (ref 22–32)
Calcium: 8.6 mg/dL — ABNORMAL LOW (ref 8.9–10.3)
Chloride: 107 mmol/L (ref 101–111)
Creatinine, Ser: 0.98 mg/dL (ref 0.44–1.00)
GFR calc Af Amer: >60 mL/min (ref >60)
GFR calc non Af Amer: >60 mL/min (ref >60)
Glucose, Bld: 92 mg/dL (ref 65–99)
Potassium: 4.3 mmol/L (ref 3.5–5.1)
Sodium: 137 mmol/L (ref 135–145)

## 2017-02-28 LAB — MAGNESIUM: Magnesium: 1.9 mg/dL (ref 1.7–2.4)

## 2017-02-28 LAB — GLUCOSE, CAPILLARY
Glucose-Capillary: 101 mg/dL — ABNORMAL HIGH (ref 65–99)
Glucose-Capillary: 114 mg/dL — ABNORMAL HIGH (ref 65–99)
Glucose-Capillary: 87 mg/dL (ref 65–99)
Glucose-Capillary: 95 mg/dL (ref 65–99)

## 2017-02-28 NOTE — Progress Notes (Signed)
PROGRESS NOTE    [name]  MCN:[id number] DOB: [birth year] DOA: 02/23/2017 PCP: Berkley Harvey, NP     Brief Narrative:  36 y.o. BF PMHx PE, HTN,  Hypothyroidism, Cholelithiasis S/P gallbladder surgery last month   Presents with complaints of abdominal pain with recurrent nausea vomiting since yesterday morning. Pain was diffuse. Last bowel movement was 48 hours ago.     Subjective: 6/24 A/O 4, negative CP negative SOB. Positive abdominal pain postprandial, negative N/V. States pain began post meal. States has had 3 abdominal surgeries (C-section, Cholecystectomy, Ventral Hernia repair)   Assessment & Plan:   Principal Problem:   SBO (small bowel obstruction) (HCC) Active Problems:   Essential hypertension   History of pulmonary embolism   Hypothyroidism   SBO (small bowel obstruction) ([place]) --Secondary to adhesions. --Abdomen is soft and has good bowel sounds. Follow-up abdominal x-ray on 6/22 shows oral contrast in the colon.   --ng clamped on 6/21 and  Removed on 6/22, ambulate, continue ivf for now, prn  Antiemetics. Prn amnalgesics -Ambulate in hallway Q shift -Patient with increased pain and nausea postprandial. Restart full liquid diet. A.m. if patient's abdominal symptoms improve would restart very bland diet BRAT   Hypokalemia:  -Resolved   Essential hypertension -Hydralazine PRN -Metoprolol 5 mg QID -Home oral meds held   History of recurrent PE -On Therapeutic Lovenox,  Hypothyroidism:  -Synthroid 125 g daily. If patient does not tolerate will have to change back to IV   Morbid obesity: Body mass index is 41.02 kg/m.   DVT prophylaxis: Lovenox Code Status: Full Family Communication: None Disposition Plan: Resolution SBO   Consultants:  GEN surgery Dr. Donnie Mesa,     Procedures/Significant Events:  None  VENTILATOR SETTINGS: None   Cultures None  Antimicrobials: Anti-infectives    None        Devices None   LINES / TUBES:  None    Continuous Infusions: . dextrose 5 % and 0.45% NaCl 75 mL/hr at 02/28/17 0047     Objective: Vitals:   02/27/17 1411 02/27/17 1956 02/28/17 0516 02/28/17 1348  BP: 120/74 125/68 113/72 (!) 131/92  Pulse: 64 64 (!) 56 63  Resp: 18 18 18 18   Temp: 98 F (36.7 C) 98.6 F (37 C) 98.2 F (36.8 C) 98.5 F (36.9 C)  TempSrc: Oral Oral Oral Oral  SpO2: 99%  99% 100%  Weight:      Height:        Intake/Output Summary (Last 24 hours) at 02/28/17 1603 Last data filed at 02/28/17 1351  Gross per 24 hour  Intake              600 ml  Output                0 ml  Net              600 ml   Filed Weights   02/23/17 1747 02/24/17 0615  Weight: 210 lb (95.3 kg) 217 lb 1.6 oz (98.5 kg)    Examination:  General: A/O 4, No acute respiratory distress Eyes: negative scleral hemorrhage, negative anisocoria, negative icterus ENT: Negative Runny nose, negative gingival bleeding, Neck:  Negative scars, masses, torticollis, lymphadenopathy, JVD Lungs: Clear to auscultation bilaterally without wheezes or crackles Cardiovascular: Regular rate and rhythm without murmur gallop or rub normal S1 and S2 Abdomen: Morbidly obese, Positive abdominal pain, nondistended, positive soft, bowel sounds, no rebound, no ascites, no appreciable mass Extremities: No significant cyanosis,  clubbing, or edema bilateral lower extremities Skin: Negative rashes, lesions, ulcers Psychiatric:  Negative depression, negative anxiety, negative fatigue, negative mania  Central nervous system:  Cranial nerves II through XII intact, tongue/uvula midline, all extremities muscle strength 5/5, sensation intact throughout, negative dysarthria, negative expressive aphasia, negative receptive aphasia.  .     Data Reviewed: Care during the described time interval was provided by me .  I have reviewed this patient's available data, including medical history, events of note,  physical examination, and all test results as part of my evaluation. I have personally reviewed and interpreted all radiology studies.  CBC:  Recent Labs Lab 02/23/17 1758 02/24/17 0633 02/25/17 0854 02/28/17 0520  WBC 5.3 10.0 6.8 6.1  NEUTROABS  --  7.6  --   --   HGB 14.1 14.2 12.7 11.9*  HCT 40.5 42.1 38.2 35.3*  MCV 94.2 95.7 97.0 95.4  PLT 334 357 299 283   Basic Metabolic Panel:  Recent Labs Lab 02/24/17 0633 02/25/17 0854 02/26/17 0638 02/27/17 0537 02/28/17 0520  NA 138 141 138 135 137  K 3.6 3.9 4.3 3.3* 4.3  CL 104 110 109 104 107  CO2 25 25 20* 24 24  GLUCOSE 107* 79 95 94 92  BUN <5* <5* <5* <5* <5*  CREATININE 0.83 0.86 0.90 0.86 0.98  CALCIUM 9.0 8.6* 8.6* 8.5* 8.6*  MG  --   --   --  1.9 1.9   GFR: Estimated Creatinine Clearance: 86.1 mL/min (by C-G formula based on SCr of 0.98 mg/dL). Liver Function Tests:  Recent Labs Lab 02/23/17 1758 02/24/17 0633  AST 19 22  ALT 16 17  ALKPHOS 76 71  BILITOT 1.0 1.0  PROT 7.7 7.5  ALBUMIN 4.2 4.0    Recent Labs Lab 02/23/17 1758  LIPASE 19   No results for input(s): AMMONIA in the last 168 hours. Coagulation Profile: No results for input(s): INR, PROTIME in the last 168 hours. Cardiac Enzymes: No results for input(s): CKTOTAL, CKMB, CKMBINDEX, TROPONINI in the last 168 hours. BNP (last 3 results) No results for input(s): PROBNP in the last 8760 hours. HbA1C: No results for input(s): HGBA1C in the last 72 hours. CBG:  Recent Labs Lab 02/27/17 0745 02/27/17 1600 02/28/17 0010 02/28/17 0747 02/28/17 1556  GLUCAP 95 93 95 87 114*   Lipid Profile: No results for input(s): CHOL, HDL, LDLCALC, TRIG, CHOLHDL, LDLDIRECT in the last 72 hours. Thyroid Function Tests:  Recent Labs  02/27/17 0537  TSH 11.412*   Anemia Panel: No results for input(s): VITAMINB12, FOLATE, FERRITIN, TIBC, IRON, RETICCTPCT in the last 72 hours. Sepsis Labs: No results for input(s): PROCALCITON, LATICACIDVEN  in the last 168 hours.  No results found for this or any previous visit (from the past 240 hour(s)).       Radiology Studies: Dg Abd 1 View  Result Date: 02/28/2017 CLINICAL DATA:  Partial bowel obstruction, 8 hour delay. EXAM: ABDOMEN - 1 VIEW COMPARISON:  Abdominal radiograph February 25, 2017 FINDINGS: Contrast in the large bowel without significant transit from prior radiograph. Decompressed small bowel. Surgical clips in the included right abdomen compatible with cholecystectomy. Soft tissue planes included osseous structures are unchanged. IMPRESSION: No significant transit of enteric contrast in the large bowel. Normal bowel gas pattern. Electronically Signed   By: Elon Alas M.D.   On: 02/28/2017 03:14        Scheduled Meds: . enoxaparin (LOVENOX) injection  1 mg/kg Subcutaneous Q12H  . famotidine  20 mg  Oral Daily  . levothyroxine  125 mcg Oral QAC breakfast  . metoprolol tartrate  5 mg Intravenous Q6H   Continuous Infusions: . dextrose 5 % and 0.45% NaCl 75 mL/hr at 02/28/17 0047     LOS: 4 days    Time spent:40 min    WOODS, Geraldo Docker, MD [facility] Pager [phone]  If 7PM-7AM, please contact night-coverage [email or web address] Password [facility] 02/28/2017, 4:03 PM

## 2017-02-28 NOTE — Progress Notes (Signed)
[facility]/Trauma Progress Note      Subjective:  CC: Abdominal pain  She states she had a rough night last night. She states she ate soup and had intermittent sharp abdominal pain similar to the pains that brought her into the ER. She is still having BMs. No vomiting area and no fevers.  Objective: Vital signs in last 24 hours: Temp:  [98 F (36.7 C)-98.6 F (37 C)] 98.2 F (36.8 C) (06/24 0516) Pulse Rate:  [56-64] 56 (06/24 0516) Resp:  [18] 18 (06/24 0516) BP: (113-128)/(68-94) 113/72 (06/24 0516) SpO2:  [99 %] 99 % (06/24 0516) Last BM Date: 02/26/17  Intake/Output from previous day: 06/23 0701 - 06/24 0700 In: 880 [P.O.:480; I.V.:400] Out: -  Intake/Output this shift: Total I/O In: 120 [P.O.:120] Out: -   PE: Gen:  Alert, NAD, pleasant, cooperative Card:  RRR, no M/G/R heard Pulm:  Rate effort normal Abd: Soft, nondistended, +BS, no hernias appreciated, TTP in epigastric region Skin: no rashes noted, warm and dry  Lab Results:   Recent Labs  02/25/17 0854 02/28/17 0520  WBC 6.8 6.1  HGB 12.7 11.9*  HCT 38.2 35.3*  PLT 299 291   BMET  Recent Labs  02/27/17 0537 02/28/17 0520  NA 135 137  K 3.3* 4.3  CL 104 107  CO2 24 24  GLUCOSE 94 92  BUN <5* <5*  CREATININE 0.86 0.98  CALCIUM 8.5* 8.6*   PT/INR No results for input(s): LABPROT, INR in the last 72 hours. CMP     Component Value Date/Time   NA 137 02/28/2017 0520   K 4.3 02/28/2017 0520   CL 107 02/28/2017 0520   CO2 24 02/28/2017 0520   GLUCOSE 92 02/28/2017 0520   BUN <5 (L) 02/28/2017 0520   CREATININE 0.98 02/28/2017 0520   CALCIUM 8.6 (L) 02/28/2017 0520   PROT 7.5 02/24/2017 0633   ALBUMIN 4.0 02/24/2017 0633   AST 22 02/24/2017 0633   ALT 17 02/24/2017 0633   ALKPHOS 71 02/24/2017 0633   BILITOT 1.0 02/24/2017 0633   GFRNONAA >60 02/28/2017 0520   GFRAA >60 02/28/2017 0520   Lipase     Component Value Date/Time   LIPASE 19 02/23/2017 1758     Studies/Results: Dg Abd 1 View  Result Date: 02/28/2017 CLINICAL DATA:  Partial bowel obstruction, 8 hour delay. EXAM: ABDOMEN - 1 VIEW COMPARISON:  Abdominal radiograph February 25, 2017 FINDINGS: Contrast in the large bowel without significant transit from prior radiograph. Decompressed small bowel. Surgical clips in the included right abdomen compatible with cholecystectomy. Soft tissue planes included osseous structures are unchanged. IMPRESSION: No significant transit of enteric contrast in the large bowel. Normal bowel gas pattern. Electronically Signed   By: Elon Alas M.D.   On: 02/28/2017 03:14    Anti-infectives: Anti-infectives    None       Assessment/Plan Small bowel obstruction  - likely due to adhesions - CT san 6/20 showed findings consistent with early small bowel obstruction with transition point in the anterior mid abdomen - XR 6/20 showed contrast in the colon - having BM's - XR today (6/24) showed No significant transit of enteric contrast in the large bowel. 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 Encourage ambulation. Unsure why patient is having midabdominal pain with eating but do not feel it is  related to bowel obstruction as patient is having adequate bowel function. Xray this AM showed contrast still in colon.     LOS: 4 days    Kalman Drape , [facility] 02/28/2017, 8:29 AM Pager: [phone] Consults: [phone] Mon-Fri 7:00 am-4:30 pm Sat-Sun 7:00 am-11:30 am

## 2017-03-01 DIAGNOSIS — R109 Unspecified abdominal pain: Secondary | ICD-10-CM

## 2017-03-01 LAB — GLUCOSE, CAPILLARY
GLUCOSE-CAPILLARY: 85 mg/dL (ref 65–99)
Glucose-Capillary: 85 mg/dL (ref 65–99)
Glucose-Capillary: 93 mg/dL (ref 65–99)
Glucose-Capillary: 97 mg/dL (ref 65–99)

## 2017-03-01 MED ORDER — METHOCARBAMOL 500 MG PO TABS
500.0000 mg | ORAL_TABLET | Freq: Three times a day (TID) | ORAL | Status: DC
  Administered 2017-03-01 – 2017-03-02 (×5): 500 mg via ORAL
  Filled 2017-03-01 (×5): qty 1

## 2017-03-01 NOTE — Progress Notes (Signed)
CSW informed pt wishes to speak about Advanced Directives and contact information.  Patient states she wants her husband off her contact list and wants her sister, dtr, and brother in law to be the only ones on there- CSW made adjustments at patient requests  Patient would like to make her sister her 45- CSW provided pt with Advance Directive paperwork and explained the Living Will and HCPOA sections  No further needs at this time  Jorge Ny, [place] Social Worker [phone]

## 2017-03-01 NOTE — Progress Notes (Signed)
PROGRESS NOTE                                                                                                                                                                                                             Patient Demographics:    [name], is a 36 y.o. female, DOB - Mar 30, 1981, XMI:[id number]  Admit date - 02/23/2017   Admitting Physician Rise Patience, MD  Outpatient Primary MD for the patient is Berkley Harvey, NP  LOS - 5  Outpatient Specialists: [facility]  Chief Complaint  Patient presents with  . Abdominal Pain       Brief Narrative   36 year old obese female with history of hypertension, PE on anticoagulation, hypothyroidism, cholelithiasis with gallbladder surgery one month back presented with recurrent nausea with vomiting and diffuse abdominal pain with findings of small bowel obstruction. Managed medically.   Subjective:    Patient tolerating full liquid diet but still complains of midabdominal pain radiating down which is worsened on movement and pressing onto the abdomen. Denies relation to food.   Assessment  & Plan :    Principal Problem:   SBO (small bowel obstruction) (HCC) Resolved but patient continues to have abdominal pain which appears muscular and arising from the abdominal wall. Jennette surgery following and reviewed her CT scan which shows Posterior facet repair may have failure to and her mesh is exposed to the viscera. No further bowel obstruction symptoms. Recommend scheduled narcotic with muscles relaxants and monitor.  Active Problems: Hypokalemia Resolved  Essential hypertension Resume home medications  History of recurrent PE On therapeutic Lovenox  Hypothyroidism Continue Synthroid  Morbid obesity   Code Status : Full code  Family Communication  : None at bedside  Disposition Plan  : Home once pain improved  Barriers For Discharge : Active  symptoms  Consults  :  [facility]  Procedures  : CT abdomen and pelvis  DVT Prophylaxis  :  Therapeutic Lovenox  Lab Results  Component Value Date   PLT 291 02/28/2017    Antibiotics  :  Anti-infectives    None        Objective:   Vitals:   02/28/17 0516 02/28/17 1348 02/28/17 2015 03/01/17 0425  BP: 113/72 (!) 131/92 137/89 125/71  Pulse: (!) 56 63 70 60  Resp: 18 18 18 18   Temp:  98.2 F (36.8 C) 98.5 F (36.9 C) 98.3 F (36.8 C) 98.5 F (36.9 C)  TempSrc: Oral Oral Oral Oral  SpO2: 99% 100% 100% 98%  Weight:      Height:        Wt Readings from Last 3 Encounters:  02/24/17 98.5 kg (217 lb 1.6 oz)  01/18/17 95.3 kg (210 lb)  01/13/17 95.3 kg (210 lb)     Intake/Output Summary (Last 24 hours) at 03/01/17 1450 Last data filed at 03/01/17 1450  Gross per 24 hour  Intake          3518.75 ml  Output                0 ml  Net          3518.75 ml     Physical Exam  Gen: not in distress HEENT:  moist mucosa, supple neck Chest: clear b/l, no added sounds CVS: N S1&S2, no murmurs,  GI: soft,  ND, BS+, mid abdominal wall tenderness on pressure Musculoskeletal: warm, no edema     Data Review:    CBC  Recent Labs Lab 02/23/17 1758 02/24/17 0633 02/25/17 0854 02/28/17 0520  WBC 5.3 10.0 6.8 6.1  HGB 14.1 14.2 12.7 11.9*  HCT 40.5 42.1 38.2 35.3*  PLT 334 357 299 291  MCV 94.2 95.7 97.0 95.4  MCH 32.8 32.3 32.2 32.2  MCHC 34.8 33.7 33.2 33.7  RDW 12.3 12.6 12.6 12.3  LYMPHSABS  --  1.9  --   --   MONOABS  --  0.5  --   --   EOSABS  --  0.1  --   --   BASOSABS  --  0.0  --   --     Chemistries   Recent Labs Lab 02/23/17 1758 02/24/17 0633 02/25/17 0854 02/26/17 0638 02/27/17 0537 02/28/17 0520  NA 138 138 141 138 135 137  K 3.6 3.6 3.9 4.3 3.3* 4.3  CL 105 104 110 109 104 107  CO2 24 25 25  20* 24 24  GLUCOSE 95 107* 79 95 94 92  BUN 6 <5* <5* <5* <5* <5*  CREATININE 0.80 0.83 0.86 0.90 0.86 0.98  CALCIUM 9.3 9.0 8.6* 8.6* 8.5* 8.6*   MG  --   --   --   --  1.9 1.9  AST 19 22  --   --   --   --   ALT 16 17  --   --   --   --   ALKPHOS 76 71  --   --   --   --   BILITOT 1.0 1.0  --   --   --   --    ------------------------------------------------------------------------------------------------------------------ No results for input(s): CHOL, HDL, LDLCALC, TRIG, CHOLHDL, LDLDIRECT in the last 72 hours.  No results found for: HGBA1C ------------------------------------------------------------------------------------------------------------------  Recent Labs  02/27/17 0537  TSH 11.412*   ------------------------------------------------------------------------------------------------------------------ No results for input(s): VITAMINB12, FOLATE, FERRITIN, TIBC, IRON, RETICCTPCT in the last 72 hours.  Coagulation profile No results for input(s): INR, PROTIME in the last 168 hours.  No results for input(s): DDIMER in the last 72 hours.  Cardiac Enzymes No results for input(s): CKMB, TROPONINI, MYOGLOBIN in the last 168 hours.  Invalid input(s): CK ------------------------------------------------------------------------------------------------------------------ No results found for: BNP  Inpatient Medications  Scheduled Meds: . enoxaparin (LOVENOX) injection  1 mg/kg Subcutaneous Q12H  . famotidine  20 mg Oral Daily  . levothyroxine  125 mcg Oral QAC breakfast  .  methocarbamol  500 mg Oral TID  . metoprolol tartrate  5 mg Intravenous Q6H   Continuous Infusions: . dextrose 5 % and 0.45% NaCl 75 mL/hr at 03/01/17 0812   PRN Meds:.acetaminophen **OR** acetaminophen, diphenhydrAMINE, hydrALAZINE, ondansetron **OR** ondansetron (ZOFRAN) IV, oxyCODONE, phenol  Micro Results No results found for this or any previous visit (from the past 240 hour(s)).  Radiology Reports Dg Abd 1 View  Result Date: 02/28/2017 CLINICAL DATA:  Partial bowel obstruction, 8 hour delay. EXAM: ABDOMEN - 1 VIEW COMPARISON:   Abdominal radiograph February 25, 2017 FINDINGS: Contrast in the large bowel without significant transit from prior radiograph. Decompressed small bowel. Surgical clips in the included right abdomen compatible with cholecystectomy. Soft tissue planes included osseous structures are unchanged. IMPRESSION: No significant transit of enteric contrast in the large bowel. Normal bowel gas pattern. Electronically Signed   By: Elon Alas M.D.   On: 02/28/2017 03:14   Dg Abdomen 1 View  Result Date: 02/24/2017 CLINICAL DATA:  Small bowel obstruction EXAM: ABDOMEN - 1 VIEW COMPARISON:  CT abdomen and pelvis February 24, 2017 FINDINGS: Supine abdomen image obtained 8 hours after administration of Gastrografin. There is contrast throughout the large bowel and rectum. There are a few loops of mildly dilated ileum which contain contrast. No free air. There are surgical clips the right upper quadrant region. Nasogastric tube tip in stomach. IMPRESSION: Loops of mildly dilated ileum. Suspect a degree of ileus. Contrast present throughout colon and rectum. No free air. Electronically Signed   By: Lowella Grip III M.D.   On: 02/24/2017 14:15   Ct Abdomen Pelvis W Contrast  Result Date: 02/24/2017 CLINICAL DATA:  Left lower quadrant abdominal pain. Nausea and vomiting. EXAM: CT ABDOMEN AND PELVIS WITH CONTRAST TECHNIQUE: Multidetector CT imaging of the abdomen and pelvis was performed using the standard protocol following bolus administration of intravenous contrast. CONTRAST:  100 cc Isovue-300 IV COMPARISON:  Abdominal CT 05/03/2016 FINDINGS: Lower chest: Linear atelectasis or scarring at the lung bases. No pleural fluid. Hepatobiliary: No focal hepatic lesion. Clips in the gallbladder fossa postcholecystectomy. No biliary dilatation. Minimal stranding in the gallbladder fossa post gallbladder surgery 5 weeks prior, an expected finding. Pancreas: No ductal dilatation or inflammation. Spleen: Normal in size without focal  abnormality. Adrenals/Urinary Tract: Adrenal glands are unremarkable. Kidneys are normal, without renal calculi, focal lesion, or hydronephrosis. Bladder is unremarkable. Stomach/Bowel: Stomach physiologically distended with intraluminal contents. Dilated fluid-filled proximal small bowel with fecalization of small bowel contents. There is a transition point in the anterior mid abdomen, image 53 series 3. No pneumatosis or definite bowel wall thickening. Small bowel distal to this is decompressed. The appendix is air-filled and normal. Small to moderate colonic stool burden without bowel wall thickening. Vascular/Lymphatic: No significant vascular findings are present. No enlarged abdominal or pelvic lymph nodes. Reproductive: Uterus and bilateral adnexa are unremarkable. Other: Postsurgical change of the anterior abdominal wall post ventral hernia repair with resolution of the previous anterior abdominal wall fluid collection. No intra-abdominal ascites. No free air or intra-abdominal abscess. Musculoskeletal: There are no acute or suspicious osseous abnormalities. IMPRESSION: Findings consistent with early small bowel obstruction with transition point in the anterior mid abdomen. Electronically Signed   By: Jeb Levering M.D.   On: 02/24/2017 01:43   Dg Abd 2 Views  Result Date: 02/25/2017 CLINICAL DATA:  Follow-up small bowel obstruction. History of adhesions. EXAM: ABDOMEN - 2 VIEW COMPARISON:  KUB of February 24, 2017 FINDINGS: Contrast has traversed the small  bowel and now appears confined to the colon. There is some contrast in the rectum. The colon exhibits normal caliber and contour. No extraluminal contrast is observed. IMPRESSION: The orally administered contrast has cleared the small bowel and now lies within normal appearing colon and rectum. Electronically Signed   By: David  Martinique M.D.   On: 02/25/2017 08:51   Dg Abd Portable 1 View  Result Date: 02/24/2017 CLINICAL DATA:  NG tube placement.  EXAM: PORTABLE ABDOMEN - 1 VIEW COMPARISON:  CT abdomen/ pelvis earlier this day. FINDINGS: Tip and side port of the enteric tube below the diaphragm in the stomach. Dilated small gallbladder seen on recent CT. No free air. IMPRESSION: Tip and side port of the enteric tube below the diaphragm in the stomach. Electronically Signed   By: Jeb Levering M.D.   On: 02/24/2017 03:57    Time Spent in minutes  25   Louellen Molder M.D on 03/01/2017 at 2:50 PM  Between 7am to 7pm - Pager - [phone]  After 7pm go to [email or web address] - password [facility]  [facility] -  Office  [phone]

## 2017-03-01 NOTE — Progress Notes (Signed)
Patient ID: [name], female   DOB: [birth year], 36 y.o.   MRN: [id number]  [facility] Surgery Progress Note     Subjective: CC- abdominal pain Patient reports no improvement. She continues to have intermittent, severe central/left abdominal pain. States that the pain is random and not always related to PO intake. She reports nausea, no vomiting. Tried to eat solid food yesterday but could not tolerate very much. She is having about 4 loose BM's daily.  Objective: Vital signs in last 24 hours: Temp:  [98.3 F (36.8 C)-98.5 F (36.9 C)] 98.5 F (36.9 C) (06/25 0425) Pulse Rate:  [60-70] 60 (06/25 0425) Resp:  [18] 18 (06/25 0425) BP: (125-137)/(71-92) 125/71 (06/25 0425) SpO2:  [98 %-100 %] 98 % (06/25 0425) Last BM Date: 02/26/17  Intake/Output from previous day: 06/24 0701 - 06/25 0700 In: 3878.8 [P.O.:840; I.V.:3038.8] Out: -  Intake/Output this shift: No intake/output data recorded.  PE: Gen: Alert, NAD, pleasant HEENT: EOM's intact, pupils equal  Card: RRR, no M/G/R heard Pulm: CTAB, no W/R/R, effort normal Abd: well healed midline incision, soft, nondistended, +BS in all 4 quadrants, no HSM, no hernia, TTP central/left abdomen and LUQ Ext: No erythema, edema, or tenderness BUE/BLE Psych: A&Ox3   Lab Results:   Recent Labs  02/28/17 0520  WBC 6.1  HGB 11.9*  HCT 35.3*  PLT 291   BMET  Recent Labs  02/27/17 0537 02/28/17 0520  NA 135 137  K 3.3* 4.3  CL 104 107  CO2 24 24  GLUCOSE 94 92  BUN <5* <5*  CREATININE 0.86 0.98  CALCIUM 8.5* 8.6*   PT/INR No results for input(s): LABPROT, INR in the last 72 hours. CMP     Component Value Date/Time   NA 137 02/28/2017 0520   K 4.3 02/28/2017 0520   CL 107 02/28/2017 0520   CO2 24 02/28/2017 0520   GLUCOSE 92 02/28/2017 0520   BUN <5 (L) 02/28/2017 0520   CREATININE 0.98 02/28/2017 0520   CALCIUM 8.6 (L) 02/28/2017 0520   PROT 7.5 02/24/2017 0633   ALBUMIN 4.0 02/24/2017 0633   AST  22 02/24/2017 0633   ALT 17 02/24/2017 0633   ALKPHOS 71 02/24/2017 0633   BILITOT 1.0 02/24/2017 0633   GFRNONAA >60 02/28/2017 0520   GFRAA >60 02/28/2017 0520   Lipase     Component Value Date/Time   LIPASE 19 02/23/2017 1758       Studies/Results: Dg Abd 1 View  Result Date: 02/28/2017 CLINICAL DATA:  Partial bowel obstruction, 8 hour delay. EXAM: ABDOMEN - 1 VIEW COMPARISON:  Abdominal radiograph February 25, 2017 FINDINGS: Contrast in the large bowel without significant transit from prior radiograph. Decompressed small bowel. Surgical clips in the included right abdomen compatible with cholecystectomy. Soft tissue planes included osseous structures are unchanged. IMPRESSION: No significant transit of enteric contrast in the large bowel. Normal bowel gas pattern. Electronically Signed   By: Elon Alas M.D.   On: 02/28/2017 03:14    Anti-infectives: Anti-infectives    None       Assessment/Plan Small bowel obstruction  - likely due to adhesions - CT san 6/20 showed findings consistent with early small bowel obstruction with transition point in the anterior mid abdomen - BM x4  HTN H/o recurrent PE - on xarelto at home Hypthyroidism  ID - none FEN - IVF, full liquids VTE - SCDs, lovenox  Plan - no obstruction as patient is having bowel function. Sshe does have persistent  left/central abdominal pain, random and with PO intake. Will discuss with MD.   LOS: 5 days    Jerrye Beavers , [facility] Surgery 03/01/2017, 8:30 AM Pager: [phone] Consults: [phone] Mon-Fri 7:00 am-4:30 pm Sat-Sun 7:00 am-11:30 am

## 2017-03-02 DIAGNOSIS — R1012 Left upper quadrant pain: Secondary | ICD-10-CM

## 2017-03-02 LAB — GLUCOSE, CAPILLARY
GLUCOSE-CAPILLARY: 90 mg/dL (ref 65–99)
Glucose-Capillary: 87 mg/dL (ref 65–99)

## 2017-03-02 MED ORDER — TRAMADOL HCL 50 MG PO TABS
50.0000 mg | ORAL_TABLET | Freq: Four times a day (QID) | ORAL | Status: DC
Start: 2017-03-02 — End: 2017-03-04
  Administered 2017-03-02 – 2017-03-04 (×7): 50 mg via ORAL
  Filled 2017-03-02 (×8): qty 1

## 2017-03-02 MED ORDER — ACETAMINOPHEN 325 MG PO TABS
650.0000 mg | ORAL_TABLET | Freq: Four times a day (QID) | ORAL | Status: DC
  Administered 2017-03-02 – 2017-03-04 (×8): 650 mg via ORAL
  Filled 2017-03-02 (×10): qty 2

## 2017-03-02 MED ORDER — RIVAROXABAN 20 MG PO TABS
20.0000 mg | ORAL_TABLET | Freq: Every evening | ORAL | Status: DC
  Administered 2017-03-02: 20 mg via ORAL
  Filled 2017-03-02: qty 1

## 2017-03-02 MED ORDER — MORPHINE SULFATE (PF) 4 MG/ML IV SOLN
2.0000 mg | Freq: Once | INTRAVENOUS | Status: AC
  Administered 2017-03-02: 2 mg via INTRAVENOUS
  Filled 2017-03-02: qty 1

## 2017-03-02 NOTE — Progress Notes (Signed)
Paged on call md x 2 regarding pt c/o severe sharp abdominal pain on the LLQ. Oxycodone not due yet.awaiting for md to reply.

## 2017-03-02 NOTE — Progress Notes (Signed)
PROGRESS NOTE                                                                                                                                                                                                             Patient Demographics:    [name], is a 36 y.o. female, DOB - 1980-10-25, RCV:[id number]  Admit date - 02/23/2017   Admitting Physician Rise Patience, MD  Outpatient Primary MD for the patient is Berkley Harvey, NP  LOS - 6  Outpatient Specialists: [facility]  Chief Complaint  Patient presents with  . Abdominal Pain       Brief Narrative   36 year old obese female with history of hypertension, PE on anticoagulation, hypothyroidism, cholelithiasis with gallbladder surgery one month back presented with recurrent nausea with vomiting and diffuse abdominal pain with findings of small bowel obstruction. Managed medically.   Subjective:    Patient tolerating diet but still complains of a lot of abdominal pain. No nausea or vomiting.  Assessment  & Plan :    Principal Problem:   SBO (small bowel obstruction) (HCC) Resolved With nonsurgical management. Diet advanced and tolerating well.   Persistent abdominal wall pain. patient continues to have abdominal pain which appears muscular and arising from the abdominal wall. Surgery following and suspect that her hernia repair may have following a part postoperative due to her obesity. Also likely then that his peritoneal breakdown and retracted internal oblique muscle with pocket containing bowel and omentum and abdominal wall. Surgery feels it has not changed from her CT scan in August except for the fluid has gone. Patient does not have signs of bowel obstruction and appears to be having chronic pain from initial repair. She would likely need additional surgery but Given her obesity she would not benefit from this at present.  Continue scheduled Robaxin and  when necessary oxycodone. Added scheduled tramadol today. Monitor for any improvement in her pain symptoms and his better can be discharged home tomorrow.  Active Problems: Hypokalemia Resolved  Essential hypertension Resume home medications  History of recurrent PE Was receiving therapeutic Lovenox, switched to home to Xarelto  Hypothyroidism Continue Synthroid  Morbid obesity Counseled on diet and weight loss  Code Status : Full code  Family Communication  : None at bedside  Disposition Plan  : Home in am if pain  better  Barriers For Discharge : Active symptoms  Consults  :  [facility]  Procedures  : CT abdomen and pelvis  DVT Prophylaxis  :  xarelto  Lab Results  Component Value Date   PLT 291 02/28/2017    Antibiotics  :  Anti-infectives    None        Objective:   Vitals:   03/01/17 0425 03/01/17 1450 03/01/17 2202 03/02/17 0000  BP: 125/71 114/78 124/85 120/70  Pulse: 60 67 66 72  Resp: 18 18 17    Temp: 98.5 F (36.9 C) 98.2 F (36.8 C) 98.2 F (36.8 C)   TempSrc: Oral Oral Oral   SpO2: 98% 100% 100%   Weight:      Height:        Wt Readings from Last 3 Encounters:  02/24/17 98.5 kg (217 lb 1.6 oz)  01/18/17 95.3 kg (210 lb)  01/13/17 95.3 kg (210 lb)     Intake/Output Summary (Last 24 hours) at 03/02/17 1347 Last data filed at 03/02/17 1058  Gross per 24 hour  Intake             1285 ml  Output                0 ml  Net             1285 ml     Physical Exam Gen.: Morbidly obese female some distress with pain HEENT: Moist Chest: Clear bilaterally Skin: Normal S1 and S2, no murmurs GI: Nondistended, mild abdominal tenderness Musculoskeletal: Warm, no edema       Data Review:    CBC  Recent Labs Lab 02/23/17 1758 02/24/17 0633 02/25/17 0854 02/28/17 0520  WBC 5.3 10.0 6.8 6.1  HGB 14.1 14.2 12.7 11.9*  HCT 40.5 42.1 38.2 35.3*  PLT 334 357 299 291  MCV 94.2 95.7 97.0 95.4  MCH 32.8 32.3 32.2 32.2  MCHC 34.8 33.7 33.2  33.7  RDW 12.3 12.6 12.6 12.3  LYMPHSABS  --  1.9  --   --   MONOABS  --  0.5  --   --   EOSABS  --  0.1  --   --   BASOSABS  --  0.0  --   --     Chemistries   Recent Labs Lab 02/23/17 1758 02/24/17 0633 02/25/17 0854 02/26/17 0638 02/27/17 0537 02/28/17 0520  NA 138 138 141 138 135 137  K 3.6 3.6 3.9 4.3 3.3* 4.3  CL 105 104 110 109 104 107  CO2 24 25 25  20* 24 24  GLUCOSE 95 107* 79 95 94 92  BUN 6 <5* <5* <5* <5* <5*  CREATININE 0.80 0.83 0.86 0.90 0.86 0.98  CALCIUM 9.3 9.0 8.6* 8.6* 8.5* 8.6*  MG  --   --   --   --  1.9 1.9  AST 19 22  --   --   --   --   ALT 16 17  --   --   --   --   ALKPHOS 76 71  --   --   --   --   BILITOT 1.0 1.0  --   --   --   --    ------------------------------------------------------------------------------------------------------------------ No results for input(s): CHOL, HDL, LDLCALC, TRIG, CHOLHDL, LDLDIRECT in the last 72 hours.  No results found for: HGBA1C ------------------------------------------------------------------------------------------------------------------ No results for input(s): TSH, T4TOTAL, T3FREE, THYROIDAB in the last 72 hours.  Invalid input(s): FREET3 ------------------------------------------------------------------------------------------------------------------ No results  for input(s): VITAMINB12, FOLATE, FERRITIN, TIBC, IRON, RETICCTPCT in the last 72 hours.  Coagulation profile No results for input(s): INR, PROTIME in the last 168 hours.  No results for input(s): DDIMER in the last 72 hours.  Cardiac Enzymes No results for input(s): CKMB, TROPONINI, MYOGLOBIN in the last 168 hours.  Invalid input(s): CK ------------------------------------------------------------------------------------------------------------------ No results found for: BNP  Inpatient Medications  Scheduled Meds: . acetaminophen  650 mg Oral Q6H  . enoxaparin (LOVENOX) injection  1 mg/kg Subcutaneous Q12H  . famotidine  20  mg Oral Daily  . levothyroxine  125 mcg Oral QAC breakfast  . methocarbamol  500 mg Oral TID  . metoprolol tartrate  5 mg Intravenous Q6H  . traMADol  50 mg Oral Q6H   Continuous Infusions: . dextrose 5 % and 0.45% NaCl 75 mL/hr at 03/02/17 0600   PRN Meds:.diphenhydrAMINE, hydrALAZINE, ondansetron **OR** ondansetron (ZOFRAN) IV, oxyCODONE, phenol  Micro Results No results found for this or any previous visit (from the past 240 hour(s)).  Radiology Reports Dg Abd 1 View  Result Date: 02/28/2017 CLINICAL DATA:  Partial bowel obstruction, 8 hour delay. EXAM: ABDOMEN - 1 VIEW COMPARISON:  Abdominal radiograph February 25, 2017 FINDINGS: Contrast in the large bowel without significant transit from prior radiograph. Decompressed small bowel. Surgical clips in the included right abdomen compatible with cholecystectomy. Soft tissue planes included osseous structures are unchanged. IMPRESSION: No significant transit of enteric contrast in the large bowel. Normal bowel gas pattern. Electronically Signed   By: Elon Alas M.D.   On: 02/28/2017 03:14   Dg Abdomen 1 View  Result Date: 02/24/2017 CLINICAL DATA:  Small bowel obstruction EXAM: ABDOMEN - 1 VIEW COMPARISON:  CT abdomen and pelvis February 24, 2017 FINDINGS: Supine abdomen image obtained 8 hours after administration of Gastrografin. There is contrast throughout the large bowel and rectum. There are a few loops of mildly dilated ileum which contain contrast. No free air. There are surgical clips the right upper quadrant region. Nasogastric tube tip in stomach. IMPRESSION: Loops of mildly dilated ileum. Suspect a degree of ileus. Contrast present throughout colon and rectum. No free air. Electronically Signed   By: Lowella Grip III M.D.   On: 02/24/2017 14:15   Ct Abdomen Pelvis W Contrast  Result Date: 02/24/2017 CLINICAL DATA:  Left lower quadrant abdominal pain. Nausea and vomiting. EXAM: CT ABDOMEN AND PELVIS WITH CONTRAST TECHNIQUE:  Multidetector CT imaging of the abdomen and pelvis was performed using the standard protocol following bolus administration of intravenous contrast. CONTRAST:  100 cc Isovue-300 IV COMPARISON:  Abdominal CT 05/03/2016 FINDINGS: Lower chest: Linear atelectasis or scarring at the lung bases. No pleural fluid. Hepatobiliary: No focal hepatic lesion. Clips in the gallbladder fossa postcholecystectomy. No biliary dilatation. Minimal stranding in the gallbladder fossa post gallbladder surgery 5 weeks prior, an expected finding. Pancreas: No ductal dilatation or inflammation. Spleen: Normal in size without focal abnormality. Adrenals/Urinary Tract: Adrenal glands are unremarkable. Kidneys are normal, without renal calculi, focal lesion, or hydronephrosis. Bladder is unremarkable. Stomach/Bowel: Stomach physiologically distended with intraluminal contents. Dilated fluid-filled proximal small bowel with fecalization of small bowel contents. There is a transition point in the anterior mid abdomen, image 53 series 3. No pneumatosis or definite bowel wall thickening. Small bowel distal to this is decompressed. The appendix is air-filled and normal. Small to moderate colonic stool burden without bowel wall thickening. Vascular/Lymphatic: No significant vascular findings are present. No enlarged abdominal or pelvic lymph nodes. Reproductive: Uterus and bilateral adnexa  are unremarkable. Other: Postsurgical change of the anterior abdominal wall post ventral hernia repair with resolution of the previous anterior abdominal wall fluid collection. No intra-abdominal ascites. No free air or intra-abdominal abscess. Musculoskeletal: There are no acute or suspicious osseous abnormalities. IMPRESSION: Findings consistent with early small bowel obstruction with transition point in the anterior mid abdomen. Electronically Signed   By: Jeb Levering M.D.   On: 02/24/2017 01:43   Dg Abd 2 Views  Result Date: 02/25/2017 CLINICAL DATA:   Follow-up small bowel obstruction. History of adhesions. EXAM: ABDOMEN - 2 VIEW COMPARISON:  KUB of February 24, 2017 FINDINGS: Contrast has traversed the small bowel and now appears confined to the colon. There is some contrast in the rectum. The colon exhibits normal caliber and contour. No extraluminal contrast is observed. IMPRESSION: The orally administered contrast has cleared the small bowel and now lies within normal appearing colon and rectum. Electronically Signed   By: David  Martinique M.D.   On: 02/25/2017 08:51   Dg Abd Portable 1 View  Result Date: 02/24/2017 CLINICAL DATA:  NG tube placement. EXAM: PORTABLE ABDOMEN - 1 VIEW COMPARISON:  CT abdomen/ pelvis earlier this day. FINDINGS: Tip and side port of the enteric tube below the diaphragm in the stomach. Dilated small gallbladder seen on recent CT. No free air. IMPRESSION: Tip and side port of the enteric tube below the diaphragm in the stomach. Electronically Signed   By: Jeb Levering M.D.   On: 02/24/2017 03:57    Time Spent in minutes  25   Louellen Molder M.D on 03/02/2017 at 1:47 PM  Between 7am to 7pm - Pager - [phone]  After 7pm go to [email or web address] - password [facility]  [facility] -  Office  [phone]

## 2017-03-02 NOTE — Progress Notes (Signed)
[facility]/Trauma Progress Note      Subjective:  CC: abdominal pain  Pt had pain all night not relieved with pain meds or robaxin. She states pain is in the same place and worse with food. Pt ate most of her breakfast. She had a watery BM yesterday and is still having flatus. No vomiting.   Objective: Vital signs in last 24 hours: Temp:  [98.2 F (36.8 C)] 98.2 F (36.8 C) (06/25 2202) Pulse Rate:  [66-72] 72 (06/26 0000) Resp:  [17-18] 17 (06/25 2202) BP: (114-124)/(70-85) 120/70 (06/26 0000) SpO2:  [100 %] 100 % (06/25 2202) Last BM Date: 03/01/17  Intake/Output from previous day: 06/25 0701 - 06/26 0700 In: 1165 [P.O.:340; I.V.:825] Out: -  Intake/Output this shift: No intake/output data recorded.  PE: Gen: Alert, NAD, pleasant  Pulm: rate and effort normal Abd: soft, nondistended, +BS, no hernia, TTP central/left abdomen and LUQ Skin: warm and dry Psych: A&Ox3   Lab Results:   Recent Labs  02/28/17 0520  WBC 6.1  HGB 11.9*  HCT 35.3*  PLT 291   BMET  Recent Labs  02/28/17 0520  NA 137  K 4.3  CL 107  CO2 24  GLUCOSE 92  BUN <5*  CREATININE 0.98  CALCIUM 8.6*   PT/INR No results for input(s): LABPROT, INR in the last 72 hours. CMP     Component Value Date/Time   NA 137 02/28/2017 0520   K 4.3 02/28/2017 0520   CL 107 02/28/2017 0520   CO2 24 02/28/2017 0520   GLUCOSE 92 02/28/2017 0520   BUN <5 (L) 02/28/2017 0520   CREATININE 0.98 02/28/2017 0520   CALCIUM 8.6 (L) 02/28/2017 0520   PROT 7.5 02/24/2017 0633   ALBUMIN 4.0 02/24/2017 0633   AST 22 02/24/2017 0633   ALT 17 02/24/2017 0633   ALKPHOS 71 02/24/2017 0633   BILITOT 1.0 02/24/2017 0633   GFRNONAA >60 02/28/2017 0520   GFRAA >60 02/28/2017 0520   Lipase     Component Value Date/Time   LIPASE 19 02/23/2017 1758    Studies/Results: No results found.  Anti-infectives: Anti-infectives    None       Assessment/Plan Small bowel obstruction  -  likely due to adhesions - CT san 6/20 showed findings consistent with early small bowel obstruction with transition point in the anterior mid abdomen - small watery BM yesterday  HTN H/o recurrent PE - on xarelto at home Hypthyroidism  ID - none FEN - IVF, soft diet VTE - SCDs, lovenox  Plan - no obstruction as patient is having bowel function. She does have persistent left/central abdominal pain, random and with PO intake. No surgical intervention at this time. It appears that the intestines have moved around the dislodged piece of mess in her left abdomen. This could be causing her symptoms. Surgery to fix this would likely fail and could result in a hernia due to pt's body habitus. Would like to continue conservative measures at this time since pt is having bowel function. Pain control and avoid constipation. Pt needs to loose weight for a surgery like this to have a better chance of success.    Will schedule tylenol to aid in pain relief, may consider added tramadol    LOS: 6 days    Kalman Drape , [facility] Surgery 03/02/2017, 10:00 AM Pager: [phone] Consults: [phone] Mon-Fri 7:00 am-4:30 pm Sat-Sun 7:00 am-11:30 am

## 2017-03-03 ENCOUNTER — Inpatient Hospital Stay (HOSPITAL_COMMUNITY): Payer: 59

## 2017-03-03 DIAGNOSIS — I2699 Other pulmonary embolism without acute cor pulmonale: Secondary | ICD-10-CM

## 2017-03-03 DIAGNOSIS — R1012 Left upper quadrant pain: Secondary | ICD-10-CM | POA: Diagnosis present

## 2017-03-03 DIAGNOSIS — E66813 Obesity, class 3: Secondary | ICD-10-CM | POA: Diagnosis present

## 2017-03-03 DIAGNOSIS — R1084 Generalized abdominal pain: Secondary | ICD-10-CM | POA: Diagnosis present

## 2017-03-03 LAB — GLUCOSE, CAPILLARY
GLUCOSE-CAPILLARY: 82 mg/dL (ref 65–99)
GLUCOSE-CAPILLARY: 85 mg/dL (ref 65–99)
Glucose-Capillary: 66 mg/dL (ref 65–99)
Glucose-Capillary: 72 mg/dL (ref 65–99)

## 2017-03-03 LAB — APTT: APTT: 36 s (ref 24–36)

## 2017-03-03 LAB — HEPARIN LEVEL (UNFRACTIONATED): HEPARIN UNFRACTIONATED: 1.76 [IU]/mL — AB (ref 0.30–0.70)

## 2017-03-03 LAB — PREALBUMIN: PREALBUMIN: 22.5 mg/dL (ref 18–38)

## 2017-03-03 MED ORDER — HEPARIN (PORCINE) IN NACL 100-0.45 UNIT/ML-% IJ SOLN
1200.0000 [IU]/h | INTRAMUSCULAR | Status: DC
  Administered 2017-03-03: 1050 [IU]/h via INTRAVENOUS
  Filled 2017-03-03 (×2): qty 250

## 2017-03-03 MED ORDER — TRAMADOL HCL 50 MG PO TABS: 50.0000 mg | ORAL_TABLET | Freq: Four times a day (QID) | ORAL | 0 refills | Status: DC

## 2017-03-03 MED ORDER — ONDANSETRON HCL 4 MG PO TABS: 4.0000 mg | ORAL_TABLET | Freq: Four times a day (QID) | ORAL | 0 refills | Status: DC

## 2017-03-03 MED ORDER — METHOCARBAMOL 500 MG PO TABS
1000.0000 mg | ORAL_TABLET | Freq: Three times a day (TID) | ORAL | Status: DC
  Administered 2017-03-03 (×2): 1000 mg via ORAL
  Filled 2017-03-03 (×3): qty 2

## 2017-03-03 MED ORDER — HYDROCODONE-ACETAMINOPHEN 5-325 MG PO TABS: 2.0000 | ORAL_TABLET | Freq: Four times a day (QID) | ORAL | 0 refills | Status: DC | PRN

## 2017-03-03 MED ORDER — LEVOTHYROXINE SODIUM 100 MCG IV SOLR
62.5000 ug | Freq: Every day | INTRAVENOUS | Status: DC
  Administered 2017-03-04 – 2017-03-09 (×5): 62.5 ug via INTRAVENOUS
  Filled 2017-03-03 (×5): qty 5

## 2017-03-03 MED ORDER — ONDANSETRON 4 MG PO TBDP: 4.0000 mg | ORAL_TABLET | Freq: Three times a day (TID) | ORAL | 0 refills | Status: DC | PRN

## 2017-03-03 MED ORDER — FAMOTIDINE IN NACL 20-0.9 MG/50ML-% IV SOLN
20.0000 mg | INTRAVENOUS | Status: DC
  Administered 2017-03-03: 20 mg via INTRAVENOUS
  Filled 2017-03-03 (×2): qty 50

## 2017-03-03 MED ORDER — PROMETHAZINE HCL 25 MG/ML IJ SOLN
12.5000 mg | Freq: Once | INTRAMUSCULAR | Status: AC
  Administered 2017-03-03: 12.5 mg via INTRAVENOUS
  Filled 2017-03-03: qty 1

## 2017-03-03 MED ORDER — HYDROMORPHONE HCL 1 MG/ML IJ SOLN
0.5000 mg | INTRAMUSCULAR | Status: DC | PRN
  Administered 2017-03-03 – 2017-03-05 (×12): 0.5 mg via INTRAVENOUS
  Filled 2017-03-03 (×12): qty 1

## 2017-03-03 MED ORDER — MORPHINE SULFATE (PF) 4 MG/ML IV SOLN
2.0000 mg | Freq: Once | INTRAVENOUS | Status: AC
  Administered 2017-03-03: 2 mg via INTRAVENOUS
  Filled 2017-03-03: qty 1

## 2017-03-03 MED ORDER — OXYCODONE HCL 5 MG PO TABS
5.0000 mg | ORAL_TABLET | Freq: Once | ORAL | Status: AC
  Administered 2017-03-03: 5 mg via ORAL
  Filled 2017-03-03: qty 1

## 2017-03-03 MED ORDER — POLYETHYLENE GLYCOL 3350 17 G PO PACK
17.0000 g | PACK | Freq: Every day | ORAL | Status: DC
  Administered 2017-03-03: 17 g via ORAL
  Filled 2017-03-03 (×2): qty 1

## 2017-03-03 MED ORDER — METHOCARBAMOL 500 MG PO TABS: 1000.0000 mg | ORAL_TABLET | Freq: Three times a day (TID) | ORAL | 0 refills | Status: DC

## 2017-03-03 MED ORDER — DEXTROSE-NACL 5-0.9 % IV SOLN
INTRAVENOUS | Status: DC
  Administered 2017-03-03 – 2017-03-05 (×4): via INTRAVENOUS

## 2017-03-03 MED ORDER — ENSURE SURGERY PO LIQD
237.0000 mL | Freq: Two times a day (BID) | ORAL | Status: DC
  Administered 2017-03-03: 237 mL via ORAL
  Filled 2017-03-03 (×5): qty 237

## 2017-03-03 MED ORDER — SENNOSIDES-DOCUSATE SODIUM 8.6-50 MG PO TABS: 2.0000 | ORAL_TABLET | Freq: Two times a day (BID) | ORAL | 0 refills | Status: DC | PRN

## 2017-03-03 NOTE — Progress Notes (Signed)
Patient ID: [name], female   DOB: 04-Sep-1981, 36 y.o.   MRN: [id number]  [facility] Surgery Progress Note     Subjective: CC- abdominal pain Patient states that she vomited last night, and this increased her abdominal pain. She did receive 1 dose of IV morphine which helped. Phenergan helped nausea more than zofran. Prior to nausea [name] felt like her scheduled pain regimen was helping to manage her pain fairly well. States that she ate some lunch yesterday and drank water. Passing flatus, BM 2 days ago.  Objective: Vital signs in last 24 hours: Temp:  [98.3 F (36.8 C)-98.5 F (36.9 C)] 98.5 F (36.9 C) (06/27 0421) Pulse Rate:  [63] 63 (06/26 1430) Resp:  [17-18] 17 (06/27 0421) BP: (124-129)/(80-92) 124/80 (06/27 0421) SpO2:  [98 %-100 %] 98 % (06/27 0421) Last BM Date: 03/01/17  Intake/Output from previous day: 06/26 0701 - 06/27 0700 In: 480 [P.O.:480] Out: 800 [Urine:800] Intake/Output this shift: No intake/output data recorded.  PE: Gen: Alert, NAD, pleasant HEENT: EOM's intact, pupils equal  Card: RRR, no M/G/R heard Pulm: CTAB, no W/R/R, effort normal Abd: obese, well healed midline incision, soft, nondistended, +BS in all 4 quadrants, no HSM, TTP central/left abdomen Ext: No erythema, edema, or tenderness BUE/BLE Psych: A&Ox3  Lab Results:  No results for input(s): WBC, HGB, HCT, PLT in the last 72 hours. BMET No results for input(s): NA, K, CL, CO2, GLUCOSE, BUN, CREATININE, CALCIUM in the last 72 hours. PT/INR No results for input(s): LABPROT, INR in the last 72 hours. CMP     Component Value Date/Time   NA 137 02/28/2017 0520   K 4.3 02/28/2017 0520   CL 107 02/28/2017 0520   CO2 24 02/28/2017 0520   GLUCOSE 92 02/28/2017 0520   BUN <5 (L) 02/28/2017 0520   CREATININE 0.98 02/28/2017 0520   CALCIUM 8.6 (L) 02/28/2017 0520   PROT 7.5 02/24/2017 0633   ALBUMIN 4.0 02/24/2017 0633   AST 22 02/24/2017 0633   ALT 17 02/24/2017  0633   ALKPHOS 71 02/24/2017 0633   BILITOT 1.0 02/24/2017 0633   GFRNONAA >60 02/28/2017 0520   GFRAA >60 02/28/2017 0520   Lipase     Component Value Date/Time   LIPASE 19 02/23/2017 1758       Studies/Results: No results found.  Anti-infectives: Anti-infectives    None       Assessment/Plan Small bowel obstruction  - likely due to adhesions - CT san 6/20 showed findings consistent with early small bowel obstruction with transition point in the anterior mid abdomen - resolved  Peritoneal breakdown/ chronic postop abdominal pain - CT shows retracted internal oblique with a pocket containing bowel and omentum in the abdominal wall, no significant change from previous CT  HTN H/o recurrent PE - on xarelto at home Hypthyroidism Obesity  ID - none FEN - IVF, heart healthy diet VTE - SCDs, xarelto  Plan - continue to work on pain control (increase robaxin to 1000 TID, and continue scheduled ultram and tylenol). Avoid IV narcotics. Encouraged patient to ask for antiemetics as needed, as it seems like her pain worsens after emesis. Hopefully will be able to discharge patient soon for outpatient follow-up with Dr. [name]. She would have a much better likelihood of success with surgery if she will continue to work on weight loss.   LOS: 7 days    [name] , [facility] Surgery 03/03/2017, 7:46 AM Pager: [phone] Consults: [phone] Mon-Fri  7:00 am-4:30 pm Sat-Sun 7:00 am-11:30 am

## 2017-03-03 NOTE — Progress Notes (Signed)
*  PRELIMINARY RESULTS* Vascular Ultrasound Bilateral lower extremity venous duplex has been completed.  Preliminary findings: No evidence of deep vein thrombosisin the visualized veins of the lower extremtiites or baker's cysts bilaterally.   Everrett Coombe 03/03/2017, 5:01 PM

## 2017-03-03 NOTE — Progress Notes (Signed)
ANTICOAGULATION CONSULT NOTE - Initial Consult  Pharmacy Consult:  Heparin Indication:  History of recurrent PE  Allergies  Allergen Reactions  . Penicillins Hives    Has patient had a PCN reaction causing immediate rash, facial/tongue/throat swelling, SOB or lightheadedness with hypotension: Yes Has patient had a PCN reaction causing severe rash involving mucus membranes or skin necrosis: Yes Has patient had a PCN reaction that required hospitalization Yes Has patient had a PCN reaction occurring within the last 10 years: No If all of the above answers are "NO", then may proceed with Cephalosporin use.     Patient Measurements: Height: 5\' 1"  (154.9 cm) Weight: 217 lb 1.6 oz (98.5 kg) IBW/kg (Calculated) : 47.8 Heparin Dosing Weight: 72 kg  Vital Signs: Temp: 98.5 F (36.9 C) (06/27 0421) Temp Source: Oral (06/27 0421) BP: 124/80 (06/27 0421)  Labs: No results for input(s): HGB, HCT, PLT, APTT, LABPROT, INR, HEPARINUNFRC, HEPRLOWMOCWT, CREATININE, CKTOTAL, CKMB, TROPONINI in the last 72 hours.  Estimated Creatinine Clearance: 86.1 mL/min (by C-G formula based on SCr of 0.98 mg/dL).   Medical History: Past Medical History:  Diagnosis Date  . Complication of anesthesia    "difficulty waking up and will fight when woken up"  . Gallstones   . Hernia, ventral   . History of blood clots   . History of pulmonary embolus (PE)   . Hypertension   . Hyperthyroidism    01/15/17- in the past  . Shortness of breath dyspnea       Assessment: 36 YOF presented with nausea, vomiting and abdominal pain.  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  She was transitioned to Lovenox on admission and then back on Xarelto on 03/02/17.  Given change in plan for surgery, Pharmacy consulted to transition patient to IV heparin.  CBC stable on 02/28/17.   Goal of Therapy:   Heparin level 0.3-0.7 units/ml aPTT 66 - 102 seconds Monitor platelets by anticoagulation protocol: Yes    Plan:  - Check baseline heparin level and aPTT this afternoon - At 1730 (24 hrs post Xarelto dose), start heparin gtt at 1050 units/hr - Check 6 hr aPTT - Daily aPTT, heparin level and CBC - Consider checking free T4 to assess thyroid function   Lazara Grieser D. Mina Marble, PharmD, BCPS Pager:  [phone] 03/03/2017, 1:24 PM

## 2017-03-03 NOTE — Progress Notes (Signed)
Pt was not feeling well when offered assistance. Pt stated she will wash up later

## 2017-03-03 NOTE — Progress Notes (Signed)
Patient ID: CATARINA HUNTLEY, female   DOB: 11/14/1980, 36 y.o.   MRN: [id number] Her xray is concerning for partial sbo.  I think at this point unfortunately due to recurrent sbo, pain, emesis that she will require surgery.  I discussed the problem with she and her father.  I tried to explain the issue which is complex.  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  I discussed a repeat laparotomy with goal of relieving obstruction, possibly removing old mesh and secondary goal of repairing her abdominal wall and possibly placing additional mesh. This would be preferred. Im not sure of how will repair whether will need bridge or I can recreate her posterior layers.  Will need to decide what to do with IA at time of surgery. I discussed possible bowel resection with this also.  I have discussed with Dr Clementeen Graham and we will stop xarelto today.  Will place on iv heparin and do surgery through heparin window.  Will plan for Friday. Will also check screening doppler US of le to ensure no dvt now given history and hospital stay.. Will start ensure surgery drinks today as well.

## 2017-03-03 NOTE — Progress Notes (Signed)
PROGRESS NOTE                                                                                                                                                                                                             Patient Demographics:    [name], is a 37 y.o. female, DOB - 1981-02-08, JKD:[id number]  Admit date - 02/23/2017   Admitting Physician Rise Patience, MD  Outpatient Primary MD for the patient is Berkley Harvey, NP  LOS - 7  Outpatient Specialists: [facility]  Chief Complaint  Patient presents with  . Abdominal Pain       Brief Narrative   36 year old obese female with history of hypertension, PE on anticoagulation, hypothyroidism, cholelithiasis with gallbladder surgery one month back presented with recurrent nausea with vomiting and diffuse abdominal pain with findings of small bowel obstruction. Managed medically.   Subjective:    Patient reported vomiting last night. Continues to have abdominal pain.  Assessment  & Plan :    Principal Problem:   SBO (small bowel obstruction) ([place]) Initially resolved with nonsurgical management. Diet was advanced but patient continued to have abdominal wall pain and had an episode of vomiting last night. Abdominal x-ray done today again showing dilated bowel loops suggestive of partial small bowel obstruction.  Discussed with surgery who recommended she would need to be operated to remove old mesh, depending abdominal wall and possibly placing additional mesh.   Persistent abdominal wall pain. Getting scheduled Robaxin and tramadol along with when necessary oxycodone without much relief. Plan on surgical repair as outlined above.  Xarelto discontinued and ordered for IV heparin. Last dose of Xarelto was yesterday. Also order for Doppler lower extremity to rule out acute DVT.   Active Problems: Hypokalemia Resolved  Essential hypertension When  necessary IV hydralazine.  History of recurrent PE Stop Xarelto. Ordered IV heparin for surgery. Can be placed on IV heparin or subcutaneous Lovenox postop.  Hypothyroidism Switch to IV Synthroid.  Morbid obesity Counseled on diet and weight loss   Discussed with Dr. Donne Hazel.. Patient will be transferred to surgery service primarily. Hospitalist will sign off. Please call us for any persistence.  Code Status : Full code  Family Communication  : None at bedside  Disposition Plan  : For surgery  Barriers For Discharge : Active symptoms  Consults  :  [facility]  Procedures  : CT abdomen and pelvis  DVT Prophylaxis  :  xarelto  Lab Results  Component Value Date   PLT 291 02/28/2017    Antibiotics  :  Anti-infectives    None        Objective:   Vitals:   03/01/17 2202 03/02/17 0000 03/02/17 1430 03/03/17 0421  BP: 124/85 120/70 (!) 129/92 124/80  Pulse: 66 72 63   Resp: 17  18 17   Temp: 98.2 F (36.8 C)  98.3 F (36.8 C) 98.5 F (36.9 C)  TempSrc: Oral  Oral Oral  SpO2: 100%  100% 98%  Weight:      Height:        Wt Readings from Last 3 Encounters:  02/24/17 98.5 kg (217 lb 1.6 oz)  01/18/17 95.3 kg (210 lb)  01/13/17 95.3 kg (210 lb)     Intake/Output Summary (Last 24 hours) at 03/03/17 1301 Last data filed at 03/03/17 0803  Gross per 24 hour  Intake              480 ml  Output             1000 ml  Net             -520 ml     Physical Exam Gen.: Obese female not in distress HEENT: Moist mucosa, supple neck Chest: Clear bilaterally CVS: Normal S1 and S2 GI: Nondistended, mild central abdominal tenderness, bowel sounds present Musculoskeletal: Warm, no edema        Data Review:    CBC  Recent Labs Lab 02/25/17 0854 02/28/17 0520  WBC 6.8 6.1  HGB 12.7 11.9*  HCT 38.2 35.3*  PLT 299 291  MCV 97.0 95.4  MCH 32.2 32.2  MCHC 33.2 33.7  RDW 12.6 12.3    Chemistries   Recent Labs Lab 02/25/17 0854 02/26/17 0638 02/27/17 0537  02/28/17 0520  NA 141 138 135 137  K 3.9 4.3 3.3* 4.3  CL 110 109 104 107  CO2 25 20* 24 24  GLUCOSE 79 95 94 92  BUN <5* <5* <5* <5*  CREATININE 0.86 0.90 0.86 0.98  CALCIUM 8.6* 8.6* 8.5* 8.6*  MG  --   --  1.9 1.9   ------------------------------------------------------------------------------------------------------------------ No results for input(s): CHOL, HDL, LDLCALC, TRIG, CHOLHDL, LDLDIRECT in the last 72 hours.  No results found for: HGBA1C ------------------------------------------------------------------------------------------------------------------ No results for input(s): TSH, T4TOTAL, T3FREE, THYROIDAB in the last 72 hours.  Invalid input(s): FREET3 ------------------------------------------------------------------------------------------------------------------ No results for input(s): VITAMINB12, FOLATE, FERRITIN, TIBC, IRON, RETICCTPCT in the last 72 hours.  Coagulation profile No results for input(s): INR, PROTIME in the last 168 hours.  No results for input(s): DDIMER in the last 72 hours.  Cardiac Enzymes No results for input(s): CKMB, TROPONINI, MYOGLOBIN in the last 168 hours.  Invalid input(s): CK ------------------------------------------------------------------------------------------------------------------ No results found for: BNP  Inpatient Medications  Scheduled Meds: . acetaminophen  650 mg Oral Q6H  . famotidine  20 mg Oral Daily  . feeding supplement  237 mL Oral BID BM  . levothyroxine  125 mcg Oral QAC breakfast  . methocarbamol  1,000 mg Oral TID  . polyethylene glycol  17 g Oral Daily  . traMADol  50 mg Oral Q6H   Continuous Infusions: . dextrose 5 % and 0.9% NaCl     PRN Meds:.diphenhydrAMINE, hydrALAZINE, HYDROmorphone (DILAUDID) injection, ondansetron **OR** ondansetron (ZOFRAN) IV, oxyCODONE, phenol  Micro Results No results found for this or any previous visit (  from the past 240 hour(s)).  Radiology Reports Dg Abd 1  View  Result Date: 03/03/2017 CLINICAL DATA:  Lower abdominal pain EXAM: ABDOMEN - 1 VIEW COMPARISON:  02/28/2017 FINDINGS: Gaseous distention of bowel loops which appears represent small bowel concerning for small bowel obstruction. Prior cholecystectomy. No free air organomegaly. Oral contrast material seen within decompressed left colon. IMPRESSION: Dilated bowel loops in the abdomen concerning for small bowel loops and small bowel obstruction. Electronically Signed   By: Rolm Baptise M.D.   On: 03/03/2017 10:32   Dg Abd 1 View  Result Date: 02/28/2017 CLINICAL DATA:  Partial bowel obstruction, 8 hour delay. EXAM: ABDOMEN - 1 VIEW COMPARISON:  Abdominal radiograph February 25, 2017 FINDINGS: Contrast in the large bowel without significant transit from prior radiograph. Decompressed small bowel. Surgical clips in the included right abdomen compatible with cholecystectomy. Soft tissue planes included osseous structures are unchanged. IMPRESSION: No significant transit of enteric contrast in the large bowel. Normal bowel gas pattern. Electronically Signed   By: Elon Alas M.D.   On: 02/28/2017 03:14   Dg Abdomen 1 View  Result Date: 02/24/2017 CLINICAL DATA:  Small bowel obstruction EXAM: ABDOMEN - 1 VIEW COMPARISON:  CT abdomen and pelvis February 24, 2017 FINDINGS: Supine abdomen image obtained 8 hours after administration of Gastrografin. There is contrast throughout the large bowel and rectum. There are a few loops of mildly dilated ileum which contain contrast. No free air. There are surgical clips the right upper quadrant region. Nasogastric tube tip in stomach. IMPRESSION: Loops of mildly dilated ileum. Suspect a degree of ileus. Contrast present throughout colon and rectum. No free air. Electronically Signed   By: Lowella Grip III M.D.   On: 02/24/2017 14:15   Ct Abdomen Pelvis W Contrast  Result Date: 02/24/2017 CLINICAL DATA:  Left lower quadrant abdominal pain. Nausea and vomiting. EXAM:  CT ABDOMEN AND PELVIS WITH CONTRAST TECHNIQUE: Multidetector CT imaging of the abdomen and pelvis was performed using the standard protocol following bolus administration of intravenous contrast. CONTRAST:  100 cc Isovue-300 IV COMPARISON:  Abdominal CT 05/03/2016 FINDINGS: Lower chest: Linear atelectasis or scarring at the lung bases. No pleural fluid. Hepatobiliary: No focal hepatic lesion. Clips in the gallbladder fossa postcholecystectomy. No biliary dilatation. Minimal stranding in the gallbladder fossa post gallbladder surgery 5 weeks prior, an expected finding. Pancreas: No ductal dilatation or inflammation. Spleen: Normal in size without focal abnormality. Adrenals/Urinary Tract: Adrenal glands are unremarkable. Kidneys are normal, without renal calculi, focal lesion, or hydronephrosis. Bladder is unremarkable. Stomach/Bowel: Stomach physiologically distended with intraluminal contents. Dilated fluid-filled proximal small bowel with fecalization of small bowel contents. There is a transition point in the anterior mid abdomen, image 53 series 3. No pneumatosis or definite bowel wall thickening. Small bowel distal to this is decompressed. The appendix is air-filled and normal. Small to moderate colonic stool burden without bowel wall thickening. Vascular/Lymphatic: No significant vascular findings are present. No enlarged abdominal or pelvic lymph nodes. Reproductive: Uterus and bilateral adnexa are unremarkable. Other: Postsurgical change of the anterior abdominal wall post ventral hernia repair with resolution of the previous anterior abdominal wall fluid collection. No intra-abdominal ascites. No free air or intra-abdominal abscess. Musculoskeletal: There are no acute or suspicious osseous abnormalities. IMPRESSION: Findings consistent with early small bowel obstruction with transition point in the anterior mid abdomen. Electronically Signed   By: Jeb Levering M.D.   On: 02/24/2017 01:43   Dg Abd 2  Views  Result Date: 02/25/2017 CLINICAL  DATA:  Follow-up small bowel obstruction. History of adhesions. EXAM: ABDOMEN - 2 VIEW COMPARISON:  KUB of February 24, 2017 FINDINGS: Contrast has traversed the small bowel and now appears confined to the colon. There is some contrast in the rectum. The colon exhibits normal caliber and contour. No extraluminal contrast is observed. IMPRESSION: The orally administered contrast has cleared the small bowel and now lies within normal appearing colon and rectum. Electronically Signed   By: David  Martinique M.D.   On: 02/25/2017 08:51   Dg Abd Portable 1 View  Result Date: 02/24/2017 CLINICAL DATA:  NG tube placement. EXAM: PORTABLE ABDOMEN - 1 VIEW COMPARISON:  CT abdomen/ pelvis earlier this day. FINDINGS: Tip and side port of the enteric tube below the diaphragm in the stomach. Dilated small gallbladder seen on recent CT. No free air. IMPRESSION: Tip and side port of the enteric tube below the diaphragm in the stomach. Electronically Signed   By: Jeb Levering M.D.   On: 02/24/2017 03:57    Time Spent in minutes  25   Louellen Molder M.D on 03/03/2017 at 1:01 PM  Between 7am to 7pm - Pager - [phone]  After 7pm go to [email or web address] - password [facility]  [facility] -  Office  [phone]

## 2017-03-04 ENCOUNTER — Inpatient Hospital Stay (HOSPITAL_COMMUNITY): Payer: 59

## 2017-03-04 LAB — GLUCOSE, CAPILLARY
GLUCOSE-CAPILLARY: 109 mg/dL — AB (ref 65–99)
GLUCOSE-CAPILLARY: 110 mg/dL — AB (ref 65–99)
Glucose-Capillary: 86 mg/dL (ref 65–99)

## 2017-03-04 LAB — CBC
HCT: 39.2 % (ref 36.0–46.0)
Hemoglobin: 13.2 g/dL (ref 12.0–15.0)
MCH: 32 pg (ref 26.0–34.0)
MCHC: 33.7 g/dL (ref 30.0–36.0)
MCV: 95.1 fL (ref 78.0–100.0)
Platelets: 298 10*3/uL (ref 150–400)
RBC: 4.12 MIL/uL (ref 3.87–5.11)
RDW: 12.2 % (ref 11.5–15.5)
WBC: 5.6 10*3/uL (ref 4.0–10.5)

## 2017-03-04 LAB — APTT
aPTT: 59 seconds — ABNORMAL HIGH (ref 24–36)
aPTT: 52 seconds — ABNORMAL HIGH (ref 24–36)
aPTT: 87 seconds — ABNORMAL HIGH (ref 24–36)

## 2017-03-04 LAB — HEPARIN LEVEL (UNFRACTIONATED): Heparin Unfractionated: 0.53 IU/mL (ref 0.30–0.70)

## 2017-03-04 MED ORDER — HEPARIN (PORCINE) IN NACL 100-0.45 UNIT/ML-% IJ SOLN
1400.0000 [IU]/h | INTRAMUSCULAR | Status: AC
  Administered 2017-03-04: 1400 [IU]/h via INTRAVENOUS
  Filled 2017-03-04: qty 250

## 2017-03-04 MED ORDER — PANTOPRAZOLE SODIUM 40 MG IV SOLR
40.0000 mg | INTRAVENOUS | Status: DC
  Administered 2017-03-04 – 2017-03-09 (×5): 40 mg via INTRAVENOUS
  Filled 2017-03-04 (×5): qty 40

## 2017-03-04 MED ORDER — ALUM & MAG HYDROXIDE-SIMETH 200-200-20 MG/5ML PO SUSP
15.0000 mL | Freq: Four times a day (QID) | ORAL | Status: DC | PRN
  Administered 2017-03-04: 15 mL via ORAL
  Filled 2017-03-04: qty 30

## 2017-03-04 MED ORDER — PROMETHAZINE HCL 25 MG/ML IJ SOLN
12.5000 mg | INTRAMUSCULAR | Status: DC | PRN
  Administered 2017-03-04 – 2017-03-09 (×10): 12.5 mg via INTRAVENOUS
  Filled 2017-03-04 (×9): qty 1

## 2017-03-04 MED ORDER — ACETAMINOPHEN 325 MG PO TABS: 650.0000 mg | ORAL_TABLET | Freq: Four times a day (QID) | ORAL | Status: DC | PRN

## 2017-03-04 NOTE — Progress Notes (Signed)
ANTICOAGULATION CONSULT NOTE  Pharmacy Consult:  Heparin Indication:  History of  PE  Allergies  Allergen Reactions  . Penicillins Hives    Has patient had a PCN reaction causing immediate rash, facial/tongue/throat swelling, SOB or lightheadedness with hypotension: Yes Has patient had a PCN reaction causing severe rash involving mucus membranes or skin necrosis: Yes Has patient had a PCN reaction that required hospitalization Yes Has patient had a PCN reaction occurring within the last 10 years: No If all of the above answers are "NO", then may proceed with Cephalosporin use.     Patient Measurements: Height: 5\' 1"  (154.9 cm) Weight: 217 lb 1.6 oz (98.5 kg) IBW/kg (Calculated) : 47.8 Heparin Dosing Weight: 72 kg  Vital Signs: Temp: 97.7 F (36.5 C) (06/27 2049) Temp Source: Oral (06/27 2049) BP: 118/76 (06/27 2049) Pulse Rate: 73 (06/27 2049)  Labs:  Recent Labs  03/03/17 1606 03/04/17 0051  APTT 36 59*  HEPARINUNFRC 1.76*  --     Estimated Creatinine Clearance: 86.1 mL/min (by C-G formula based on SCr of 0.98 mg/dL).  Assessment: 36 y.o. female with h/o PE, Xarelto on hold, for heparin   Goal of Therapy:  Heparin level 0.3-0.7 units/ml aPTT 66 - 102 seconds Monitor platelets by anticoagulation protocol: Yes   Plan:  Increase Heparin 1200 units/hr APTT in 8 hours  Phillis Knack, PharmD, BCPS  03/04/2017, 1:53 AM

## 2017-03-04 NOTE — Progress Notes (Signed)
ANTICOAGULATION CONSULT NOTE  Pharmacy Consult:  Heparin Indication:  History of recurrent PE  Allergies  Allergen Reactions  . Penicillins Anaphylaxis and Hives    PATIENT HAD A PCN REACTION WITH IMMEDIATE RASH, FACIAL/TONGUE/THROAT SWELLING, SOB, OR LIGHTHEADEDNESS WITH HYPOTENSION:  #  #  #  YES  #  #  #   Reaction causing SEVERE RASH INVOLVING MUCUS MEMBRANES or SKIN NECROSIS: #  #  #  YES  #  #  #   PATIENT HAS HAD A PCN REACTION THAT REQUIRED HOSPITALIZATION:  #  #  #  YES  #  #  #  Has patient had a PCN reaction occurring within the last 10 years: No     Patient Measurements: Height: 5\' 1"  (154.9 cm) Weight: 217 lb 1.6 oz (98.5 kg) IBW/kg (Calculated) : 47.8 Heparin Dosing Weight: 72 kg  Vital Signs: Temp: 97.8 F (36.6 C) (06/28 1449) Temp Source: Oral (06/28 1449) BP: 131/85 (06/28 1449) Pulse Rate: 82 (06/28 1449)  Labs:  Recent Labs  03/03/17 1606 03/04/17 0051 03/04/17 1011 03/04/17 1918  HGB  --   --  13.2  --   HCT  --   --  39.2  --   PLT  --   --  298  --   APTT 36 59* 52* 87*  HEPARINUNFRC 1.76*  --  0.53  --     Estimated Creatinine Clearance: 86.1 mL/min (by C-G formula based on SCr of 0.98 mg/dL). Assessment: 21 YOF presented with nausea, vomiting and abdominal pain.  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  She was transitioned to Lovenox on admission and then back on Xarelto on 03/02/17.  Given change in plan for surgery, Pharmacy consulted to transition patient to IV heparin.    Currently using aPTT to assist with heparin dosing since Xarelto could falsely elevated heparin levels.    APTT this evening is in range at 87 seconds on 1400 units/hr. No bleeding noted.   Goal of Therapy:  Heparin level 0.3-0.7 units/ml aPTT 66 - 102 seconds Monitor platelets by anticoagulation protocol: Yes    Plan:  - Continue heparin at  1400 units/hr - Heparin to stop at 0300 for surgery, so daily levels were discontinued - Follow up plans for anticoagulation post-op  Dezire Turk D. [place], PharmD, [place] Clinical Pharmacist [phone] 03/04/2017 8:31 PM

## 2017-03-04 NOTE — Progress Notes (Signed)
ANTICOAGULATION CONSULT NOTE - Initial Consult  Pharmacy Consult:  Heparin Indication:  History of recurrent PE  Allergies  Allergen Reactions  . Penicillins Anaphylaxis and Hives    PATIENT HAD A PCN REACTION WITH IMMEDIATE RASH, FACIAL/TONGUE/THROAT SWELLING, SOB, OR LIGHTHEADEDNESS WITH HYPOTENSION:  #  #  #  YES  #  #  #   Reaction causing SEVERE RASH INVOLVING MUCUS MEMBRANES or SKIN NECROSIS: #  #  #  YES  #  #  #   PATIENT HAS HAD A PCN REACTION THAT REQUIRED HOSPITALIZATION:  #  #  #  YES  #  #  #  Has patient had a PCN reaction occurring within the last 10 years: No     Patient Measurements: Height: 5\' 1"  (154.9 cm) Weight: 217 lb 1.6 oz (98.5 kg) IBW/kg (Calculated) : 47.8 Heparin Dosing Weight: 72 kg  Vital Signs: Temp: 98.1 F (36.7 C) (06/28 0559) Temp Source: Oral (06/28 0559) BP: 122/78 (06/28 0559) Pulse Rate: 85 (06/28 0559)  Labs:  Recent Labs  03/03/17 1606 03/04/17 0051 03/04/17 1011  HGB  --   --  13.2  HCT  --   --  39.2  PLT  --   --  298  APTT 36 59* 52*  HEPARINUNFRC 1.76*  --  0.53    Estimated Creatinine Clearance: 86.1 mL/min (by C-G formula based on SCr of 0.98 mg/dL).   Medical History: Past Medical History:  Diagnosis Date  . Complication of anesthesia    "difficulty waking up and will fight when woken up"  . Gallstones   . Hernia, ventral   . History of blood clots   . History of pulmonary embolus (PE)   . Hypertension   . Hyperthyroidism    01/15/17- in the past  . Shortness of breath dyspnea       Assessment: 36 YOF presented with nausea, vomiting and abdominal pain.  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  She was transitioned to Lovenox on admission and then back on Xarelto on 03/02/17.  Given change in plan for surgery, Pharmacy consulted to transition patient to IV heparin.    Currently using aPTT  to assist with heparin dosing since Xarelto could falsely elevated heparin levels.  APTT sub-therapeutic and trended down.  No interruption nor complication per RN.  No bleeding reported.   Goal of Therapy:  Heparin level 0.3-0.7 units/ml aPTT 66 - 102 seconds Monitor platelets by anticoagulation protocol: Yes    Plan:  - Increase heparin gtt to 1400 units/hr - Check 6 hr aPTT  - Daily aPTT, heparin level and CBC - Consider checking free T4 to assess thyroid function   Kaydon Husby D. Mina Marble, PharmD, BCPS Pager:  [phone] 03/04/2017, 12:41 PM

## 2017-03-04 NOTE — Progress Notes (Signed)
   Subjective/Chief Complaint: Better this am but no flatus, tol some clears, pain controlled   Objective: Vital signs in last 24 hours: Temp:  [97.7 F (36.5 C)-98.1 F (36.7 C)] 98.1 F (36.7 C) (06/28 0559) Pulse Rate:  [72-85] 85 (06/28 0559) BP: (118-122)/(71-78) 122/78 (06/28 0559) SpO2:  [98 %-99 %] 98 % (06/28 0559) Last BM Date: 03/01/17  Intake/Output from previous day: 06/27 0701 - 06/28 0700 In: 1764.9 [P.O.:604; I.V.:1110.9; IV Piggyback:50] Out: 300 [Urine:300] Intake/Output this shift: Total I/O In: 120 [P.O.:120] Out: 0   Resp: clear to auscultation bilaterally Cardio: regular rate and rhythm GI: soft minimally tender left side, hard to tell if distended  Lab Results:   Recent Labs  03/04/17 1011  WBC 5.6  HGB 13.2  HCT 39.2  PLT 298   BMET No results for input(s): NA, K, CL, CO2, GLUCOSE, BUN, CREATININE, CALCIUM in the last 72 hours. PT/INR No results for input(s): LABPROT, INR in the last 72 hours. ABG No results for input(s): PHART, HCO3 in the last 72 hours.  Invalid input(s): PCO2, PO2  Studies/Results: Dg Abd 1 View  Result Date: 03/04/2017 CLINICAL DATA:  Small bowel obstruction.  Left abdominal pain. EXAM: ABDOMEN - 1 VIEW COMPARISON:  03/03/2016 FINDINGS: Previously seen dilated small bowel loops have decreased. Mild residual small bowel dilatation in the right lower abdomen. Oral contrast material noted within decompressed colon. No free air organomegaly. Prior cholecystectomy. IMPRESSION: Continued mild small bowel obstruction pattern, improving since prior study. Electronically Signed   By: Rolm Baptise M.D.   On: 03/04/2017 10:55   Dg Abd 1 View  Result Date: 03/03/2017 CLINICAL DATA:  Lower abdominal pain EXAM: ABDOMEN - 1 VIEW COMPARISON:  02/28/2017 FINDINGS: Gaseous distention of bowel loops which appears represent small bowel concerning for small bowel obstruction. Prior cholecystectomy. No free air organomegaly. Oral  contrast material seen within decompressed left colon. IMPRESSION: Dilated bowel loops in the abdomen concerning for small bowel loops and small bowel obstruction. Electronically Signed   By: Rolm Baptise M.D.   On: 03/03/2017 10:32    Anti-infectives: Anti-infectives    None      Assessment/Plan: Abdominal wall disruption s/p tar  Xray today shows dilated loops but  I dont think needs ng [place] of legs shows no dvt On heparin, will stop in am prior to surgery I think due to obstruction and likely pseudotar that she needs to go to surgery for relief of obstruction and reconstruction of abdominal wall in safest manner possible. She understands this is certainly with some risk but we have no choice now.  Will plan on doing with one of my partners who also does abdominal wall reconstruction tomorrow.  We have discussed risks at length including recurrent hernia and possibly additional surgery.  [facility] 03/04/2017

## 2017-03-05 ENCOUNTER — Encounter (HOSPITAL_COMMUNITY): Admission: EM | Disposition: A | Discharge status: Home / Self Care | Payer: Self-pay | Source: Home / Self Care

## 2017-03-05 ENCOUNTER — Inpatient Hospital Stay (HOSPITAL_COMMUNITY): Payer: 59

## 2017-03-05 ENCOUNTER — Inpatient Hospital Stay (HOSPITAL_COMMUNITY): Payer: 59 | Admitting: Certified Registered Nurse Anesthetist

## 2017-03-05 ENCOUNTER — Encounter (HOSPITAL_COMMUNITY): Payer: Self-pay | Admitting: Certified Registered Nurse Anesthetist

## 2017-03-05 HISTORY — PX: LAPAROTOMY: SHX154

## 2017-03-05 HISTORY — PX: LYSIS OF ADHESION: SHX5961

## 2017-03-05 LAB — CBC
HCT: 39.1 % (ref 36.0–46.0)
HEMATOCRIT: 38.4 % (ref 36.0–46.0)
HEMATOCRIT: 40.1 % (ref 36.0–46.0)
HEMOGLOBIN: 13.7 g/dL (ref 12.0–15.0)
Hemoglobin: 13 g/dL (ref 12.0–15.0)
Hemoglobin: 13.4 g/dL (ref 12.0–15.0)
MCH: 32.3 pg (ref 26.0–34.0)
MCH: 32.2 pg (ref 26.0–34.0)
MCH: 32.2 pg (ref 26.0–34.0)
MCHC: 33.9 g/dL (ref 30.0–36.0)
MCHC: 34.3 g/dL (ref 30.0–36.0)
MCHC: 34.2 g/dL (ref 30.0–36.0)
MCV: 95.3 fL (ref 78.0–100.0)
MCV: 94 fL (ref 78.0–100.0)
MCV: 94.4 fL (ref 78.0–100.0)
PLATELETS: 302 10*3/uL (ref 150–400)
Platelets: 329 10*3/uL (ref 150–400)
Platelets: 319 10*3/uL (ref 150–400)
RBC: 4.03 MIL/uL (ref 3.87–5.11)
RBC: 4.16 MIL/uL (ref 3.87–5.11)
RBC: 4.25 MIL/uL (ref 3.87–5.11)
RDW: 12.2 % (ref 11.5–15.5)
RDW: 12 % (ref 11.5–15.5)
RDW: 12.2 % (ref 11.5–15.5)
WBC: 7.3 10*3/uL (ref 4.0–10.5)
WBC: 14.2 10*3/uL — ABNORMAL HIGH (ref 4.0–10.5)
WBC: 13.9 10*3/uL — AB (ref 4.0–10.5)

## 2017-03-05 LAB — TYPE AND SCREEN
ABO/RH(D): O POS
ANTIBODY SCREEN: NEGATIVE

## 2017-03-05 LAB — GLUCOSE, CAPILLARY
GLUCOSE-CAPILLARY: 97 mg/dL (ref 65–99)
GLUCOSE-CAPILLARY: 108 mg/dL — AB (ref 65–99)
Glucose-Capillary: 107 mg/dL — ABNORMAL HIGH (ref 65–99)

## 2017-03-05 LAB — SURGICAL PCR SCREEN
MRSA, PCR: NEGATIVE
Staphylococcus aureus: NEGATIVE

## 2017-03-05 LAB — ABO/RH: ABO/RH(D): O POS

## 2017-03-05 SURGERY — LAPAROTOMY, EXPLORATORY
Anesthesia: General | Site: Abdomen

## 2017-03-05 MED ORDER — MIDAZOLAM HCL 2 MG/2ML IJ SOLN
INTRAMUSCULAR | Status: AC
  Filled 2017-03-05: qty 2

## 2017-03-05 MED ORDER — FENTANYL CITRATE (PF) 250 MCG/5ML IJ SOLN
INTRAMUSCULAR | Status: AC
  Filled 2017-03-05: qty 5

## 2017-03-05 MED ORDER — HYDROMORPHONE HCL 1 MG/ML IJ SOLN
INTRAMUSCULAR | Status: AC
  Filled 2017-03-05: qty 0.5

## 2017-03-05 MED ORDER — ALBUMIN HUMAN 5 % IV SOLN
INTRAVENOUS | Status: DC | PRN
  Administered 2017-03-05: 11:00:00 via INTRAVENOUS

## 2017-03-05 MED ORDER — MORPHINE SULFATE 2 MG/ML IV SOLN
INTRAVENOUS | Status: DC
  Administered 2017-03-05 (×2): via INTRAVENOUS
  Administered 2017-03-05: 33 mg via INTRAVENOUS
  Administered 2017-03-06: 16:00:00 via INTRAVENOUS
  Administered 2017-03-06: 22.5 mg via INTRAVENOUS
  Administered 2017-03-06: 18 mg via INTRAVENOUS
  Administered 2017-03-06: 6 mg via INTRAVENOUS
  Administered 2017-03-06: 15 mg via INTRAVENOUS
  Administered 2017-03-07: 2 mg via INTRAVENOUS
  Administered 2017-03-07: 3 mg via INTRAVENOUS
  Administered 2017-03-07: 9 mg via INTRAVENOUS
  Administered 2017-03-07: 4 mg via INTRAVENOUS
  Administered 2017-03-07: 9 mg via INTRAVENOUS
  Administered 2017-03-07: 4.5 mg via INTRAVENOUS
  Administered 2017-03-07: 12 mg via INTRAVENOUS
  Administered 2017-03-07: 8 mg via INTRAVENOUS
  Administered 2017-03-08: 4.5 mg via INTRAVENOUS
  Administered 2017-03-08: 18 mg via INTRAVENOUS
  Administered 2017-03-08: 6 mg via INTRAVENOUS
  Administered 2017-03-08: 10:00:00 via INTRAVENOUS
  Administered 2017-03-08: 10.5 mg via INTRAVENOUS
  Administered 2017-03-08 (×2): 3 mg via INTRAVENOUS
  Administered 2017-03-09: 6 mg via INTRAVENOUS
  Administered 2017-03-09: 9 mg via INTRAVENOUS
  Administered 2017-03-09: 10.5 mg via INTRAVENOUS
  Filled 2017-03-05: qty 25
  Filled 2017-03-05: qty 30
  Filled 2017-03-05: qty 25
  Filled 2017-03-05 (×5): qty 30

## 2017-03-05 MED ORDER — DIPHENHYDRAMINE HCL 50 MG/ML IJ SOLN
12.5000 mg | Freq: Four times a day (QID) | INTRAMUSCULAR | Status: DC | PRN
  Administered 2017-03-06 – 2017-03-08 (×6): 12.5 mg via INTRAVENOUS
  Filled 2017-03-05 (×6): qty 1

## 2017-03-05 MED ORDER — FENTANYL CITRATE (PF) 100 MCG/2ML IJ SOLN
INTRAMUSCULAR | Status: DC | PRN
  Administered 2017-03-05 (×2): 50 ug via INTRAVENOUS
  Administered 2017-03-05: 100 ug via INTRAVENOUS
  Administered 2017-03-05 (×2): 50 ug via INTRAVENOUS
  Administered 2017-03-05: 100 ug via INTRAVENOUS

## 2017-03-05 MED ORDER — DEXAMETHASONE SODIUM PHOSPHATE 10 MG/ML IJ SOLN
INTRAMUSCULAR | Status: DC | PRN
  Administered 2017-03-05: 10 mg via INTRAVENOUS

## 2017-03-05 MED ORDER — LACTATED RINGERS IV SOLN
INTRAVENOUS | Status: DC | PRN
  Administered 2017-03-05: 09:00:00 via INTRAVENOUS

## 2017-03-05 MED ORDER — SUCCINYLCHOLINE CHLORIDE 200 MG/10ML IV SOSY
PREFILLED_SYRINGE | INTRAVENOUS | Status: DC | PRN
  Administered 2017-03-05: 120 mg via INTRAVENOUS

## 2017-03-05 MED ORDER — LACTATED RINGERS IV SOLN
INTRAVENOUS | Status: DC | PRN
  Administered 2017-03-05 (×2): via INTRAVENOUS

## 2017-03-05 MED ORDER — DEXAMETHASONE SODIUM PHOSPHATE 10 MG/ML IJ SOLN
INTRAMUSCULAR | Status: AC
  Filled 2017-03-05: qty 1

## 2017-03-05 MED ORDER — NEOSTIGMINE METHYLSULFATE 5 MG/5ML IV SOSY
PREFILLED_SYRINGE | INTRAVENOUS | Status: DC | PRN
  Administered 2017-03-05: 4 mg via INTRAVENOUS

## 2017-03-05 MED ORDER — LIDOCAINE 2% (20 MG/ML) 5 ML SYRINGE
INTRAMUSCULAR | Status: AC
  Filled 2017-03-05: qty 5

## 2017-03-05 MED ORDER — NEOSTIGMINE METHYLSULFATE 5 MG/5ML IV SOSY
PREFILLED_SYRINGE | INTRAVENOUS | Status: AC
  Filled 2017-03-05: qty 5

## 2017-03-05 MED ORDER — ROCURONIUM BROMIDE 10 MG/ML (PF) SYRINGE
PREFILLED_SYRINGE | INTRAVENOUS | Status: DC | PRN
  Administered 2017-03-05: 20 mg via INTRAVENOUS
  Administered 2017-03-05: 30 mg via INTRAVENOUS

## 2017-03-05 MED ORDER — SODIUM CHLORIDE 0.9 % IV SOLN
INTRAVENOUS | Status: DC
  Administered 2017-03-05 – 2017-03-06 (×2): via INTRAVENOUS

## 2017-03-05 MED ORDER — NALOXONE HCL 0.4 MG/ML IJ SOLN: 0.4000 mg | INTRAMUSCULAR | Status: DC | PRN

## 2017-03-05 MED ORDER — PROMETHAZINE HCL 25 MG/ML IJ SOLN
INTRAMUSCULAR | Status: AC
  Administered 2017-03-05: 12.5 mg via INTRAVENOUS
  Filled 2017-03-05: qty 1

## 2017-03-05 MED ORDER — PHENYLEPHRINE 40 MCG/ML (10ML) SYRINGE FOR IV PUSH (FOR BLOOD PRESSURE SUPPORT)
PREFILLED_SYRINGE | INTRAVENOUS | Status: AC
  Filled 2017-03-05: qty 10

## 2017-03-05 MED ORDER — PROPOFOL 10 MG/ML IV BOLUS
INTRAVENOUS | Status: AC
Start: 2017-03-05 — End: 2017-03-05
  Filled 2017-03-05: qty 20

## 2017-03-05 MED ORDER — HEPARIN (PORCINE) IN NACL 100-0.45 UNIT/ML-% IJ SOLN
1400.0000 [IU]/h | INTRAMUSCULAR | Status: DC
  Filled 2017-03-05: qty 250

## 2017-03-05 MED ORDER — VANCOMYCIN HCL IN DEXTROSE 1-5 GM/200ML-% IV SOLN
INTRAVENOUS | Status: AC
  Filled 2017-03-05: qty 200

## 2017-03-05 MED ORDER — 0.9 % SODIUM CHLORIDE (POUR BTL) OPTIME
TOPICAL | Status: DC | PRN
Start: 2017-03-05 — End: 2017-03-05
  Administered 2017-03-05 (×2): 1000 mL

## 2017-03-05 MED ORDER — ACETAMINOPHEN 10 MG/ML IV SOLN
1000.0000 mg | Freq: Four times a day (QID) | INTRAVENOUS | Status: AC
  Administered 2017-03-05 – 2017-03-06 (×2): 1000 mg via INTRAVENOUS
  Filled 2017-03-05 (×3): qty 100

## 2017-03-05 MED ORDER — VANCOMYCIN HCL IN DEXTROSE 1-5 GM/200ML-% IV SOLN
1000.0000 mg | INTRAVENOUS | Status: AC
  Administered 2017-03-05: 1000 mg via INTRAVENOUS

## 2017-03-05 MED ORDER — KETAMINE HCL-SODIUM CHLORIDE 100-0.9 MG/10ML-% IV SOSY
PREFILLED_SYRINGE | INTRAVENOUS | Status: AC
Start: 2017-03-05 — End: 2017-03-05
  Filled 2017-03-05: qty 10

## 2017-03-05 MED ORDER — PHENYLEPHRINE HCL 10 MG/ML IJ SOLN
INTRAVENOUS | Status: DC | PRN
  Administered 2017-03-05: 10 ug/min via INTRAVENOUS

## 2017-03-05 MED ORDER — SUCCINYLCHOLINE CHLORIDE 20 MG/ML IJ SOLN: INTRAMUSCULAR | Status: DC | PRN

## 2017-03-05 MED ORDER — MIDAZOLAM HCL 5 MG/5ML IJ SOLN
INTRAMUSCULAR | Status: DC | PRN
  Administered 2017-03-05: 2 mg via INTRAVENOUS

## 2017-03-05 MED ORDER — PROPOFOL 10 MG/ML IV BOLUS
INTRAVENOUS | Status: DC | PRN
  Administered 2017-03-05: 200 mg via INTRAVENOUS
  Administered 2017-03-05: 20 mg via INTRAVENOUS

## 2017-03-05 MED ORDER — KETAMINE HCL 10 MG/ML IJ SOLN
INTRAMUSCULAR | Status: DC | PRN
  Administered 2017-03-05: 50 mg via INTRAVENOUS

## 2017-03-05 MED ORDER — DIPHENHYDRAMINE HCL 12.5 MG/5ML PO ELIX: 12.5000 mg | ORAL_SOLUTION | Freq: Four times a day (QID) | ORAL | Status: DC | PRN

## 2017-03-05 MED ORDER — ROCURONIUM BROMIDE 10 MG/ML (PF) SYRINGE
PREFILLED_SYRINGE | INTRAVENOUS | Status: AC
  Filled 2017-03-05: qty 5

## 2017-03-05 MED ORDER — PROPOFOL 10 MG/ML IV BOLUS
INTRAVENOUS | Status: AC
  Filled 2017-03-05: qty 20

## 2017-03-05 MED ORDER — GLYCOPYRROLATE 0.2 MG/ML IV SOSY
PREFILLED_SYRINGE | INTRAVENOUS | Status: DC | PRN
Start: 2017-03-05 — End: 2017-03-05
  Administered 2017-03-05: 0.6 mg via INTRAVENOUS

## 2017-03-05 MED ORDER — LIDOCAINE 2% (20 MG/ML) 5 ML SYRINGE
INTRAMUSCULAR | Status: DC | PRN
  Administered 2017-03-05: 100 mg via INTRAVENOUS

## 2017-03-05 MED ORDER — ONDANSETRON HCL 4 MG/2ML IJ SOLN
INTRAMUSCULAR | Status: AC
  Filled 2017-03-05: qty 2

## 2017-03-05 MED ORDER — HEPARIN (PORCINE) IN NACL 100-0.45 UNIT/ML-% IJ SOLN
1600.0000 [IU]/h | INTRAMUSCULAR | Status: DC
  Administered 2017-03-05 – 2017-03-08 (×4): 1400 [IU]/h via INTRAVENOUS
  Administered 2017-03-09 – 2017-03-10 (×2): 1600 [IU]/h via INTRAVENOUS
  Filled 2017-03-05 (×9): qty 250

## 2017-03-05 MED ORDER — PHENYLEPHRINE 40 MCG/ML (10ML) SYRINGE FOR IV PUSH (FOR BLOOD PRESSURE SUPPORT)
PREFILLED_SYRINGE | INTRAVENOUS | Status: DC | PRN
  Administered 2017-03-05 (×3): 40 ug via INTRAVENOUS

## 2017-03-05 MED ORDER — ONDANSETRON HCL 4 MG/2ML IJ SOLN
4.0000 mg | Freq: Four times a day (QID) | INTRAMUSCULAR | Status: DC | PRN
  Administered 2017-03-06: 4 mg via INTRAVENOUS
  Filled 2017-03-05: qty 2

## 2017-03-05 MED ORDER — ONDANSETRON HCL 4 MG/2ML IJ SOLN
INTRAMUSCULAR | Status: DC | PRN
  Administered 2017-03-05: 4 mg via INTRAVENOUS

## 2017-03-05 MED ORDER — SUCCINYLCHOLINE CHLORIDE 200 MG/10ML IV SOSY
PREFILLED_SYRINGE | INTRAVENOUS | Status: AC
  Filled 2017-03-05: qty 10

## 2017-03-05 MED ORDER — SODIUM CHLORIDE 0.9% FLUSH: 9.0000 mL | INTRAVENOUS | Status: DC | PRN

## 2017-03-05 MED ORDER — FENTANYL CITRATE (PF) 100 MCG/2ML IJ SOLN
INTRAMUSCULAR | Status: AC
  Filled 2017-03-05: qty 2

## 2017-03-05 MED ORDER — LIDOCAINE IN D5W 4-5 MG/ML-% IV SOLN
1.0000 mg/min | INTRAVENOUS | Status: DC
  Administered 2017-03-05: 2.3 mg/min via INTRAVENOUS
  Filled 2017-03-05: qty 500

## 2017-03-05 SURGICAL SUPPLY — 47 items
BRR ADH 5X3 SEPRAFILM 6 SHT (MISCELLANEOUS) ×2
CANISTER SUCT 3000ML PPV (MISCELLANEOUS) ×4 IMPLANT
CANISTER WOUND CARE 500ML ATS (WOUND CARE) ×3 IMPLANT
CHLORAPREP W/TINT 26ML (MISCELLANEOUS) ×4 IMPLANT
COVER SURGICAL LIGHT HANDLE (MISCELLANEOUS) ×4 IMPLANT
DRAPE LAPAROSCOPIC ABDOMINAL (DRAPES) ×4 IMPLANT
DRAPE WARM FLUID 44X44 (DRAPE) ×4 IMPLANT
DRSG VAC ATS LRG SENSATRAC (GAUZE/BANDAGES/DRESSINGS) ×3 IMPLANT
ELECT BLADE 4.0 EZ CLEAN MEGAD (MISCELLANEOUS) ×4
ELECT BLADE 6.5 EXT (BLADE) ×3 IMPLANT
ELECT CAUTERY BLADE 6.4 (BLADE) ×3 IMPLANT
ELECT REM PT RETURN 9FT ADLT (ELECTROSURGICAL) ×4
ELECTRODE BLDE 4.0 EZ CLN MEGD (MISCELLANEOUS) ×1 IMPLANT
ELECTRODE REM PT RTRN 9FT ADLT (ELECTROSURGICAL) ×2 IMPLANT
GLOVE BIO SURGEON STRL SZ7 (GLOVE) ×10 IMPLANT
GLOVE BIO SURGEON STRL SZ7.5 (GLOVE) ×3 IMPLANT
GLOVE BIOGEL PI IND STRL 7.0 (GLOVE) ×1 IMPLANT
GLOVE BIOGEL PI IND STRL 7.5 (GLOVE) ×2 IMPLANT
GLOVE BIOGEL PI IND STRL 8 (GLOVE) ×1 IMPLANT
GLOVE BIOGEL PI INDICATOR 7.0 (GLOVE) ×2
GLOVE BIOGEL PI INDICATOR 7.5 (GLOVE) ×2
GLOVE BIOGEL PI INDICATOR 8 (GLOVE) ×2
GOWN STRL REUS W/ TWL LRG LVL3 (GOWN DISPOSABLE) ×4 IMPLANT
GOWN STRL REUS W/TWL LRG LVL3 (GOWN DISPOSABLE) ×8
KIT BASIN OR (CUSTOM PROCEDURE TRAY) ×4 IMPLANT
KIT ROOM TURNOVER OR (KITS) ×4 IMPLANT
LIGASURE IMPACT 36 18CM CVD LR (INSTRUMENTS) ×3 IMPLANT
NS IRRIG 1000ML POUR BTL (IV SOLUTION) ×8 IMPLANT
PACK GENERAL/GYN (CUSTOM PROCEDURE TRAY) ×4 IMPLANT
PAD ARMBOARD 7.5X6 YLW CONV (MISCELLANEOUS) ×4 IMPLANT
SEPRAFILM PROCEDURAL PACK 3X5 (MISCELLANEOUS) ×3 IMPLANT
SPONGE LAP 18X18 X RAY DECT (DISPOSABLE) ×6 IMPLANT
SUCTION POOLE TIP (SUCTIONS) ×4 IMPLANT
SUT PDS AB 1 TP1 96 (SUTURE) ×2 IMPLANT
SUT PROLENE 1 CT (SUTURE) ×45 IMPLANT
SUT SILK 2 0 (SUTURE) ×4
SUT SILK 2 0 SH CR/8 (SUTURE) ×4 IMPLANT
SUT SILK 2-0 18XBRD TIE 12 (SUTURE) ×2 IMPLANT
SUT SILK 3 0 (SUTURE) ×4
SUT SILK 3 0 SH CR/8 (SUTURE) ×7 IMPLANT
SUT SILK 3-0 18XBRD TIE 12 (SUTURE) ×2 IMPLANT
SUT VIC AB 2-0 SH 18 (SUTURE) ×12 IMPLANT
SUT VIC AB 3-0 SH 27 (SUTURE)
SUT VIC AB 3-0 SH 27X BRD (SUTURE) IMPLANT
TOWEL OR 17X26 10 PK STRL BLUE (TOWEL DISPOSABLE) ×4 IMPLANT
TRAY FOLEY W/METER SILVER 16FR (SET/KITS/TRAYS/PACK) ×4 IMPLANT
YANKAUER SUCT BULB TIP NO VENT (SUCTIONS) IMPLANT

## 2017-03-05 NOTE — Transfer of Care (Signed)
Immediate Anesthesia Transfer of Care Note  Patient: [name]  Procedure(s) Performed: Procedure(s): EXPLORATORY LAPAROTOMY (N/A) LYSIS OF ADHESIONs (N/A)  Patient Location: PACU  Anesthesia Type:General  Level of Consciousness: awake and alert   Airway & Oxygen Therapy: Patient Spontanous Breathing and Patient connected to nasal cannula oxygen  Post-op Assessment: Report given to RN, Post -op Vital signs reviewed and stable and Patient moving all extremities X 4  Post vital signs: Reviewed and stable  Last Vitals:  Vitals:   03/04/17 2141 03/05/17 0536  BP: (!) 141/78 118/78  Pulse: 64 71  Resp:    Temp: 37.1 C 36.8 C    Last Pain:  Vitals:   03/05/17 0746  TempSrc:   PainSc: 3       Patients Stated Pain Goal: 2 (68/03/21 2248)  Complications: No apparent anesthesia complications

## 2017-03-05 NOTE — Anesthesia Procedure Notes (Signed)
Procedure Name: Intubation Date/Time: 03/05/2017 9:28 AM Performed by: Garrison [place] T Pre-anesthesia Checklist: Patient identified, Emergency Drugs available, Suction available and Patient being monitored Patient Re-evaluated:Patient Re-evaluated prior to inductionOxygen Delivery Method: Circle System Utilized Preoxygenation: Pre-oxygenation with 100% oxygen Intubation Type: IV induction, Rapid sequence and Cricoid Pressure applied Laryngoscope Size: Miller and 2 Grade View: Grade I Tube type: Subglottic suction tube Tube size: 7.5 mm Number of attempts: 1 Airway Equipment and Method: Stylet and Oral airway Placement Confirmation: ETT inserted through vocal cords under direct vision,  positive ETCO2 and breath sounds checked- equal and bilateral Secured at: 22 cm Tube secured with: Tape Dental Injury: Teeth and Oropharynx as per pre-operative assessment

## 2017-03-05 NOTE — Anesthesia Procedure Notes (Signed)
Central Venous Catheter Insertion Performed by: Catalina Gravel, anesthesiologist Start/End6/29/2018 8:55 AM, 03/05/2017 9:02 AM Patient location: Pre-op. Preanesthetic checklist: patient identified, IV checked, site marked, risks and benefits discussed, surgical consent, monitors and equipment checked, pre-op evaluation, timeout performed and anesthesia consent Position: Trendelenburg Lidocaine 1% used for infiltration and patient sedated Hand hygiene performed , maximum sterile barriers used  and Seldinger technique used Catheter size: 8 Fr Total catheter length 16. Central line was placed.Double lumen Procedure performed using ultrasound guided technique. Ultrasound Notes:anatomy identified, needle tip was noted to be adjacent to the nerve/plexus identified, no ultrasound evidence of intravascular and/or intraneural injection and image(s) printed for medical record Attempts: 1 Following insertion, dressing applied, line sutured and Biopatch. Post procedure assessment: blood return through all ports  Patient tolerated the procedure well with no immediate complications.

## 2017-03-05 NOTE — Progress Notes (Signed)
ANTICOAGULATION CONSULT NOTE  Pharmacy Consult:  Heparin Indication:  History of recurrent PE   Patient Measurements: Height: 5\' 1"  (154.9 cm) Weight: 217 lb 1.6 oz (98.5 kg) IBW/kg (Calculated) : 47.8 Heparin Dosing Weight: 72 kg  Vital Signs: Temp: 97.3 F (36.3 C) (06/29 1200) BP: 141/90 (06/29 1645) Pulse Rate: 96 (06/29 1645)  Labs:  Recent Labs  03/03/17 1606 03/04/17 0051  03/04/17 1011 03/04/17 1918 03/05/17 0544 03/05/17 1641  HGB  --   --   < > 13.2  --  13.0 13.4  HCT  --   --   --  39.2  --  38.4 39.1  PLT  --   --   --  298  --  329 302  APTT 36 59*  --  52* 87*  --   --   HEPARINUNFRC 1.76*  --   --  0.53  --   --   --   < > = values in this interval not displayed.  Estimated Creatinine Clearance: 86.1 mL/min (by C-G formula based on SCr of 0.98 mg/dL). Assessment: 35 YOF presented with nausea, vomiting and abdominal pain.  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  She was transitioned to Lovenox on admission and then back on Xarelto on 03/02/17.  Given change in plan for surgery, Pharmacy consulted to transition patient to IV heparin.    Currently using aPTT to assist with heparin dosing since Xarelto could falsely elevated heparin levels.    APTT this yesterday was in range at 87 seconds on 1400 units/hr. Heparin turned off early this am. Patient now s/p intra-abdominal surgery/repair. Orders to resume heparin now without bolus. Will restart at previous rate. Will check aptt/heparin level in am.  Goal of Therapy:  Heparin level 0.3-0.7 units/ml aPTT 66 - 102 seconds Monitor platelets by anticoagulation protocol: Yes    Plan:  - Restart heparin at 1400 units/hr - Follow up plans for anticoagulation when stable  Erin Hearing PharmD., BCPS Clinical Pharmacist Pager [phone] 03/05/2017 7:01 PM

## 2017-03-05 NOTE — Op Note (Signed)
Preoperative diagnosis: small bowel obstruction, failed TAR hernia repair Postoperative diagnosis: sbo, bilateral linea semilunaris disruption Procedure: laparotomy, lysis of adhesions times 2 hours Surgeon: Dr Serita Grammes Asst: Dr Ralene Ok EBL: 50 cc Drains none Specimens none Complications none Anesthesia general Sponge and needle count correct dispo to recovery stable.  Indications: This is a 36 year old morbidly obese female who underwent a TAR hernia repair last year. She has had problems since then. She has had abdominal pain and has undergone a laparoscopic cholecystectomy recently. She was recently admitted with a small bowel obstruction. This resolved and then recurred. After reviewing her CT scans I think that in August the hernia repair had already failed. I think this is the source of her pain. 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 Due to her obstruction I discussed going back to the operating room. We held her xarelto just through a heparin window.  Procedure: After informed consent was obtained the patient was taken to the operating room. She was given vancomycin due to her penicillin allergy. She was placed under general endotracheal anesthesia. A nasogastric tube and a Foley were placed. She was then prepped and draped in the standard sterile surgical fashion. A surgical timeout was performed.  I reentered her old midline incision. I carried this down superiorly until I reached the mesh. Inferiorly I was able to identify the mesh. This is a little bit more difficult and there were several pockets where she had a prior seroma. Eventually I was able to enter the mesh and enter into the peritoneum. It became apparent very quickly that her posterior layer had fallen apart in places and looked really like an area of Swiss cheese where there were areas of bowel  herniated up through and into her abdominal wall. It also appeared that the bilateral seminal linea semilunaris was disrupted leading to the bowel wall present in her abdominal wall. For about 2 hours we lysed adhesions including all of the posterior layer to make sure that the viscera were nonobstructed. There clearly was one area where there was a transition point. There were several serosal tears during this dissection that were repaired with 3-0 silk suture. There were no enterotomies made. I then ran the bowel several times with no evidence of any injury. I was able to release all of this posteriorly as well. There was no way to really bring the posterior layer together as these are retracted out to her ASIS bilaterally. This is made significantly harder due to her body habitus. I elected at this point to not proceed with any further hernia repair. Dr Rosendo Gros was in agreement. I did take the posterior layer and tack it to the mesh with multiple 2-0 Vicryl sutures. This was done in order to not have another pocket where this hernia could occur internally. Once this was done this we ran the bowel again with no injury. We then used the mesh and the external oblique to close the abdomen using multiple interrupted #1 Prolene sutures. I did confirm placement of the NG tube in the stomach. Then left the wound open. I irrigated and placed a VAC sponge. She tolerated this well was extubated and transferred to recovery stable.

## 2017-03-05 NOTE — Anesthesia Preprocedure Evaluation (Addendum)
Anesthesia Evaluation  Patient identified by MRN, date of birth, ID band Patient awake    Reviewed: Allergy & Precautions, NPO status , Patient's Chart, lab work & pertinent test results, reviewed documented beta blocker date and time   History of Anesthesia Complications (+) Emergence Delirium and history of anesthetic complications  Airway Mallampati: II  TM Distance: >3 FB Neck ROM: Full    Dental  (+) Dental Advisory Given   Pulmonary neg pulmonary ROS, shortness of breath and with exertion, PE   Pulmonary exam normal breath sounds clear to auscultation       Cardiovascular hypertension, Pt. on medications and Pt. on home beta blockers Normal cardiovascular exam Rhythm:Regular Rate:Normal     Neuro/Psych negative neurological ROS     GI/Hepatic negative GI ROS, Neg liver ROS,   Endo/Other  Hypothyroidism Morbid obesity  Renal/GU negative Renal ROS     Musculoskeletal negative musculoskeletal ROS (+)   Abdominal   Peds  Hematology  (+) Blood dyscrasia (Xarelto), ,   Anesthesia Other Findings Day of surgery medications reviewed with the patient.  Reproductive/Obstetrics                            Anesthesia Physical Anesthesia Plan  ASA: III  Anesthesia Plan: General   Post-op Pain Management:    Induction: Intravenous  PONV Risk Score and Plan:   Airway Management Planned: Oral ETT  Additional Equipment: CVP, Ultrasound Guidance Line Placement and Arterial line  Intra-op Plan:   Post-operative Plan: Possible Post-op intubation/ventilation  Informed Consent: I have reviewed the patients History and Physical, chart, labs and discussed the procedure including the risks, benefits and alternatives for the proposed anesthesia with the patient or authorized representative who has indicated his/her understanding and acceptance.   Dental advisory given  Plan Discussed with:  CRNA, Anesthesiologist and Surgeon  Anesthesia Plan Comments: (Risks/benefits of general anesthesia discussed with patient including risk of damage to teeth, lips, gum, and tongue, nausea/vomiting, allergic reactions to medications, and the possibility of heart attack, stroke and death.  All patient questions answered.  Patient wishes to proceed.)       Anesthesia Quick Evaluation

## 2017-03-05 NOTE — Anesthesia Procedure Notes (Signed)
Arterial Line Insertion Start/End6/29/2018 8:45 AM Performed by: Judeth Cornfield T  Patient location: Pre-op. Preanesthetic checklist: patient identified, IV checked, site marked, risks and benefits discussed, surgical consent, monitors and equipment checked, pre-op evaluation and anesthesia consent Lidocaine 1% used for infiltration Right, radial was placed Catheter size: 20 G Hand hygiene performed  and maximum sterile barriers used   Attempts: 1 Procedure performed without using ultrasound guided technique. Ultrasound Notes:anatomy identified and no ultrasound evidence of intravascular and/or intraneural injection Following insertion, dressing applied and Biopatch. Post procedure assessment: normal  Patient tolerated the procedure well with no immediate complications.

## 2017-03-05 NOTE — Anesthesia Postprocedure Evaluation (Signed)
Anesthesia Post Note  Patient: [name]  Procedure(s) Performed: Procedure(s) (LRB): EXPLORATORY LAPAROTOMY (N/A) LYSIS OF ADHESIONs (N/A)     Patient location during evaluation: PACU Anesthesia Type: General Level of consciousness: awake Pain management: pain level controlled Vital Signs Assessment: post-procedure vital signs reviewed and stable Respiratory status: spontaneous breathing Cardiovascular status: stable Anesthetic complications: no    Last Vitals:  Vitals:   03/05/17 1315 03/05/17 1330  BP: (!) 138/94 (!) 141/95  Pulse: 85 85  Resp: 15 14  Temp:      Last Pain:  Vitals:   03/05/17 1300  TempSrc:   PainSc: Asleep                 [name]

## 2017-03-05 NOTE — H&P (View-Only) (Signed)
   Subjective/Chief Complaint: Better this am but no flatus, tol some clears, pain controlled   Objective: Vital signs in last 24 hours: Temp:  [97.7 F (36.5 C)-98.1 F (36.7 C)] 98.1 F (36.7 C) (06/28 0559) Pulse Rate:  [72-85] 85 (06/28 0559) BP: (118-122)/(71-78) 122/78 (06/28 0559) SpO2:  [98 %-99 %] 98 % (06/28 0559) Last BM Date: 03/01/17  Intake/Output from previous day: 06/27 0701 - 06/28 0700 In: 1764.9 [P.O.:604; I.V.:1110.9; IV Piggyback:50] Out: 300 [Urine:300] Intake/Output this shift: Total I/O In: 120 [P.O.:120] Out: 0   Resp: clear to auscultation bilaterally Cardio: regular rate and rhythm GI: soft minimally tender left side, hard to tell if distended  Lab Results:   Recent Labs  03/04/17 1011  WBC 5.6  HGB 13.2  HCT 39.2  PLT 298   BMET No results for input(s): NA, K, CL, CO2, GLUCOSE, BUN, CREATININE, CALCIUM in the last 72 hours. PT/INR No results for input(s): LABPROT, INR in the last 72 hours. ABG No results for input(s): PHART, HCO3 in the last 72 hours.  Invalid input(s): PCO2, PO2  Studies/Results: Dg Abd 1 View  Result Date: 03/04/2017 CLINICAL DATA:  Small bowel obstruction.  Left abdominal pain. EXAM: ABDOMEN - 1 VIEW COMPARISON:  03/03/2016 FINDINGS: Previously seen dilated small bowel loops have decreased. Mild residual small bowel dilatation in the right lower abdomen. Oral contrast material noted within decompressed colon. No free air organomegaly. Prior cholecystectomy. IMPRESSION: Continued mild small bowel obstruction pattern, improving since prior study. Electronically Signed   By: Rolm Baptise M.D.   On: 03/04/2017 10:55   Dg Abd 1 View  Result Date: 03/03/2017 CLINICAL DATA:  Lower abdominal pain EXAM: ABDOMEN - 1 VIEW COMPARISON:  02/28/2017 FINDINGS: Gaseous distention of bowel loops which appears represent small bowel concerning for small bowel obstruction. Prior cholecystectomy. No free air organomegaly. Oral  contrast material seen within decompressed left colon. IMPRESSION: Dilated bowel loops in the abdomen concerning for small bowel loops and small bowel obstruction. Electronically Signed   By: Rolm Baptise M.D.   On: 03/03/2017 10:32    Anti-infectives: Anti-infectives    None      Assessment/Plan: Abdominal wall disruption s/p tar  Xray today shows dilated loops but  I dont think needs ng [place] of legs shows no dvt On heparin, will stop in am prior to surgery I think due to obstruction and likely pseudotar that she needs to go to surgery for relief of obstruction and reconstruction of abdominal wall in safest manner possible. She understands this is certainly with some risk but we have no choice now.  Will plan on doing with one of my partners who also does abdominal wall reconstruction tomorrow.  We have discussed risks at length including recurrent hernia and possibly additional surgery.  [facility] 03/04/2017

## 2017-03-05 NOTE — Interval H&P Note (Signed)
History and Physical Interval Note:  03/05/2017 8:35 AM  [name]  has presented today for surgery, with the diagnosis of bowel obstruction, failed tar  The various methods of treatment have been discussed with the patient and family. After consideration of risks, benefits and other options for treatment, the patient has consented to  Procedure(s): EXPLORATORY LAPAROTOMY AND HERNIA REPAIR (N/A) LYSIS OF ADHESIONs (N/A) REMOVAL OF MESH (N/A) as a surgical intervention .  The patient's history has been reviewed, patient examined, no change in status, stable for surgery.  I have reviewed the patient's chart and labs.  Questions were answered to the patient's satisfaction.     Trenae Brunke

## 2017-03-06 ENCOUNTER — Encounter (HOSPITAL_COMMUNITY): Payer: Self-pay | Admitting: General Surgery

## 2017-03-06 LAB — CBC
HCT: 40 % (ref 36.0–46.0)
Hemoglobin: 13.2 g/dL (ref 12.0–15.0)
MCH: 32 pg (ref 26.0–34.0)
MCHC: 33 g/dL (ref 30.0–36.0)
MCV: 96.9 fL (ref 78.0–100.0)
PLATELETS: 352 10*3/uL (ref 150–400)
RBC: 4.13 MIL/uL (ref 3.87–5.11)
RDW: 12.5 % (ref 11.5–15.5)
WBC: 10.9 10*3/uL — ABNORMAL HIGH (ref 4.0–10.5)

## 2017-03-06 LAB — BASIC METABOLIC PANEL
Anion gap: 6 (ref 5–15)
CHLORIDE: 107 mmol/L (ref 101–111)
CO2: 27 mmol/L (ref 22–32)
CREATININE: 0.8 mg/dL (ref 0.44–1.00)
Calcium: 7.9 mg/dL — ABNORMAL LOW (ref 8.9–10.3)
GFR calc Af Amer: >60 mL/min (ref >60)
GFR calc non Af Amer: >60 mL/min (ref >60)
GLUCOSE: 107 mg/dL — AB (ref 65–99)
Potassium: 3.4 mmol/L — ABNORMAL LOW (ref 3.5–5.1)
SODIUM: 140 mmol/L (ref 135–145)

## 2017-03-06 LAB — APTT: aPTT: 80 seconds — ABNORMAL HIGH (ref 24–36)

## 2017-03-06 LAB — HEPARIN LEVEL (UNFRACTIONATED)
Heparin Unfractionated: 0.42 IU/mL (ref 0.30–0.70)
Heparin Unfractionated: 0.63 IU/mL (ref 0.30–0.70)

## 2017-03-06 LAB — GLUCOSE, CAPILLARY: Glucose-Capillary: 102 mg/dL — ABNORMAL HIGH (ref 65–99)

## 2017-03-06 MED ORDER — ACETAMINOPHEN 10 MG/ML IV SOLN
1000.0000 mg | Freq: Four times a day (QID) | INTRAVENOUS | Status: AC
  Administered 2017-03-06 – 2017-03-07 (×4): 1000 mg via INTRAVENOUS
  Filled 2017-03-06 (×7): qty 100

## 2017-03-06 MED ORDER — POTASSIUM CHLORIDE 10 MEQ/100ML IV SOLN
10.0000 meq | INTRAVENOUS | Status: AC
  Administered 2017-03-06 (×3): 10 meq via INTRAVENOUS
  Filled 2017-03-06 (×3): qty 100

## 2017-03-06 MED ORDER — KCL IN DEXTROSE-NACL 20-5-0.45 MEQ/L-%-% IV SOLN
INTRAVENOUS | Status: DC
  Administered 2017-03-06: 100 mL/h via INTRAVENOUS
  Administered 2017-03-07: 04:00:00 via INTRAVENOUS
  Administered 2017-03-07: 1 mL via INTRAVENOUS
  Administered 2017-03-08 – 2017-03-09 (×4): via INTRAVENOUS
  Filled 2017-03-06 (×8): qty 1000

## 2017-03-06 NOTE — Progress Notes (Signed)
ANTICOAGULATION CONSULT NOTE  Pharmacy Consult:  Heparin Indication:  History of recurrent PE   Patient Measurements: Height: 5\' 1"  (154.9 cm) Weight: 217 lb 1.6 oz (98.5 kg) IBW/kg (Calculated) : 47.8 Heparin Dosing Weight: 72 kg  Vital Signs: Temp: 98.3 F (36.8 C) (06/30 0300) Temp Source: Axillary (06/30 0300) BP: 133/88 (06/30 0300) Pulse Rate: 93 (06/30 0300)  Labs:  Recent Labs  03/03/17 1606  03/04/17 1011 03/04/17 1918 03/05/17 0544 03/05/17 1641 03/05/17 1900 03/06/17 0200  HGB  --   < > 13.2  --  13.0 13.4 13.7  --   HCT  --   < > 39.2  --  38.4 39.1 40.1  --   PLT  --   < > 298  --  329 302 319  --   APTT 36  < > 52* 87*  --   --   --  80*  HEPARINUNFRC 1.76*  --  0.53  --   --   --   --  0.42  < > = values in this interval not displayed.  Estimated Creatinine Clearance: 86.1 mL/min (by C-G formula based on SCr of 0.98 mg/dL). Assessment: 51 YOF presented with nausea, vomiting and abdominal pain.  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  She was transitioned to Lovenox on admission and then back on Xarelto on 03/02/17.  Given change in plan for surgery, Pharmacy consulted to transition patient to IV heparin.    Heparin level therapeutic at 0.42 after restarting heparin infusion at 1400 units/hr s/p intra-abdominal surgery/repair on 06/29. CBC stable.   Goal of Therapy:  Heparin level 0.3-0.7 units/ml Monitor platelets by anticoagulation protocol: Yes    Plan:  - Continue heparin infusion at 1400 units/hr  - Daily heparin level and CBC - Follow up plans for anticoagulation when stable   Vincenza Hews, PharmD, BCPS 03/06/2017, 3:27 AM

## 2017-03-06 NOTE — Progress Notes (Signed)
Report called to RN on 6N. Pt transferred in bed on room air. Pt taken off tele per order for med surg.

## 2017-03-06 NOTE — Progress Notes (Signed)
1 Day Post-Op   Subjective/Chief Complaint: Doing well, pain controlled, no complaints, no bowel function   Objective: Vital signs in last 24 hours: Temp:  [97.3 F (36.3 C)-98.3 F (36.8 C)] 98.2 F (36.8 C) (06/30 0800) Pulse Rate:  [77-107] 105 (06/30 0800) Resp:  [10-21] 18 (06/30 0800) BP: (118-149)/(69-101) 126/85 (06/30 0341) SpO2:  [94 %-100 %] 100 % (06/30 0800) Arterial Line BP: (97-184)/(74-116) 148/77 (06/30 0800) Last BM Date: 03/03/17  Intake/Output from previous day: 06/29 0701 - 06/30 0700 In: 2783.6 [I.V.:2333.6; IV Piggyback:450] Out: 2685 [Urine:1660; Emesis/NG output:275; Drains:50; Blood:50] Intake/Output this shift: Total I/O In: 228 [I.V.:228] Out: -   General appearance: no distress Resp: clear to auscultation bilaterally Cardio: regular rate and rhythm GI: soft approp tender vac in place no bs  Lab Results:   Recent Labs  03/05/17 1900 03/06/17 0359  WBC 13.9* 10.9*  HGB 13.7 13.2  HCT 40.1 40.0  PLT 319 352   BMET  Recent Labs  03/06/17 0359  NA 140  K 3.4*  CL 107  CO2 27  GLUCOSE 107*  BUN <5*  CREATININE 0.80  CALCIUM 7.9*   PT/INR No results for input(s): LABPROT, INR in the last 72 hours. ABG No results for input(s): PHART, HCO3 in the last 72 hours.  Invalid input(s): PCO2, PO2  Studies/Results: Dg Abd 1 View  Result Date: 03/04/2017 CLINICAL DATA:  Small bowel obstruction.  Left abdominal pain. EXAM: ABDOMEN - 1 VIEW COMPARISON:  03/03/2016 FINDINGS: Previously seen dilated small bowel loops have decreased. Mild residual small bowel dilatation in the right lower abdomen. Oral contrast material noted within decompressed colon. No free air organomegaly. Prior cholecystectomy. IMPRESSION: Continued mild small bowel obstruction pattern, improving since prior study. Electronically Signed   By: Rolm Baptise M.D.   On: 03/04/2017 10:55   Dg Chest Port 1 View  Result Date: 03/05/2017 CLINICAL DATA:  Central venous  catheter placement EXAM: PORTABLE CHEST 1 VIEW COMPARISON:  None. FINDINGS: Central venous catheter tip overlies the lower SVC. There is shallow lung inflation with bibasilar opacities. No focal consolidation. No pleural effusion or pneumothorax. IMPRESSION: Central venous catheter tip in the lower SVC. Hypoinflation with bibasilar opacities, likely atelectasis. Electronically Signed   By: Ulyses Jarred M.D.   On: 03/05/2017 13:42    Anti-infectives: Anti-infectives    Start     Dose/Rate Route Frequency Ordered Stop   03/05/17 0845  vancomycin (VANCOCIN) IVPB 1000 mg/200 mL premix     1,000 mg 200 mL/hr over 60 Minutes Intravenous To [facility] 03/05/17 0834 03/05/17 1030   03/05/17 0828  vancomycin (VANCOCIN) 1-5 GM/200ML-% IVPB    Comments:  [name], [name]   : cabinet override      03/05/17 0828 03/05/17 0930      Assessment/Plan: POD 1 loa,elap  1. Neuro- continue pca and another 24 hours iv tylenol today 2. Cv/pulm- needs aggressive pulm toilet with IS and oob, heparin restarted for vte history, will leave on iv heparin until tolerating po and cbc stable then transition back to xarelto, had preop doppler US that was negative 3. Gi- continue ng tube today, expect ileus 4. Vac change start Monday 5. Dc foley today, replace K, change IV fluids, recheck bmet in am 6 hct stable, continue recheck daily on heparin postop 7. Scds, protonix, iv heparin 8. Transfer to 6N  [facility] 03/06/2017

## 2017-03-06 NOTE — Progress Notes (Signed)
ANTICOAGULATION CONSULT NOTE  Pharmacy Consult:  Heparin Indication:  History of recurrent PE   Patient Measurements: Height: 5\' 1"  (154.9 cm) Weight: 217 lb 1.6 oz (98.5 kg) IBW/kg (Calculated) : 47.8 Heparin Dosing Weight: 72 kg  Vital Signs: Temp: 98.8 F (37.1 C) (06/30 1541) Temp Source: Axillary (06/30 1140) BP: 137/94 (06/30 1541) Pulse Rate: 118 (06/30 1541)  Labs:  Recent Labs  03/04/17 1011 03/04/17 1918  03/05/17 1641 03/05/17 1900 03/06/17 0200 03/06/17 0359 03/06/17 1600  HGB 13.2  --   < > 13.4 13.7  --  13.2  --   HCT 39.2  --   < > 39.1 40.1  --  40.0  --   PLT 298  --   < > 302 319  --  352  --   APTT 52* 87*  --   --   --  80*  --   --   HEPARINUNFRC 0.53  --   --   --   --  0.42  --  0.63  CREATININE  --   --   --   --   --   --  0.80  --   < > = values in this interval not displayed.  Estimated Creatinine Clearance: 105.5 mL/min (by C-G formula based on SCr of 0.8 mg/dL).   Assessment: 60 YOF presented with nausea, vomiting and abdominal pain.  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  She was transitioned to Lovenox on admission and then back on Xarelto on 03/02/17.  Given change in plan for surgery, Pharmacy consulted to transition patient to IV heparin.    Heparin level therapeutic at 0.63 on 1400 units/hr s/p intra-abdominal surgery/repair on 06/29. CBC stable.   Goal of Therapy:  Heparin level 0.3-0.7 units/ml Monitor platelets by anticoagulation protocol: Yes    Plan:  - Continue heparin infusion at 1400 units/hr  - Daily heparin level and CBC - Follow up plans for anticoagulation when stable  [name], PharmD, BCPS Pager # [phone] 03/06/2017 4:32 PM

## 2017-03-07 LAB — BASIC METABOLIC PANEL
ANION GAP: 6 (ref 5–15)
BUN: <5 mg/dL — ABNORMAL LOW (ref 6–20)
CALCIUM: 7.7 mg/dL — AB (ref 8.9–10.3)
CO2: 25 mmol/L (ref 22–32)
Chloride: 103 mmol/L (ref 101–111)
Creatinine, Ser: 0.69 mg/dL (ref 0.44–1.00)
Glucose, Bld: 110 mg/dL — ABNORMAL HIGH (ref 65–99)
Potassium: 3.5 mmol/L (ref 3.5–5.1)
Sodium: 134 mmol/L — ABNORMAL LOW (ref 135–145)

## 2017-03-07 LAB — GLUCOSE, CAPILLARY
GLUCOSE-CAPILLARY: 103 mg/dL — AB (ref 65–99)
GLUCOSE-CAPILLARY: 103 mg/dL — AB (ref 65–99)
Glucose-Capillary: 108 mg/dL — ABNORMAL HIGH (ref 65–99)

## 2017-03-07 LAB — CBC
HCT: 37.8 % (ref 36.0–46.0)
Hemoglobin: 12.8 g/dL (ref 12.0–15.0)
MCH: 32.5 pg (ref 26.0–34.0)
MCHC: 33.9 g/dL (ref 30.0–36.0)
MCV: 95.9 fL (ref 78.0–100.0)
PLATELETS: 271 10*3/uL (ref 150–400)
RBC: 3.94 MIL/uL (ref 3.87–5.11)
RDW: 12.4 % (ref 11.5–15.5)
WBC: 15.4 10*3/uL — AB (ref 4.0–10.5)

## 2017-03-07 LAB — POCT I-STAT 7, (LYTES, BLD GAS, ICA,H+H)
ACID-BASE EXCESS: 2 mmol/L (ref 0.0–2.0)
Bicarbonate: 27 mmol/L (ref 20.0–28.0)
Calcium, Ion: 1.18 mmol/L (ref 1.15–1.40)
HEMATOCRIT: 36 % (ref 36.0–46.0)
HEMOGLOBIN: 12.2 g/dL (ref 12.0–15.0)
O2 SAT: 100 %
PH ART: 7.39 (ref 7.350–7.450)
PO2 ART: 289 mmHg — AB (ref 83.0–108.0)
POTASSIUM: 3.9 mmol/L (ref 3.5–5.1)
Patient temperature: 37
Sodium: 140 mmol/L (ref 135–145)
TCO2: 28 mmol/L (ref 0–100)
pCO2 arterial: 44.7 mmHg (ref 32.0–48.0)

## 2017-03-07 LAB — HEPARIN LEVEL (UNFRACTIONATED): HEPARIN UNFRACTIONATED: 0.48 [IU]/mL (ref 0.30–0.70)

## 2017-03-07 MED ORDER — FAMOTIDINE 20 MG PO TABS
20.0000 mg | ORAL_TABLET | Freq: Every day | ORAL | Status: DC
  Administered 2017-03-07 – 2017-03-09 (×3): 20 mg via ORAL
  Filled 2017-03-07 (×3): qty 1

## 2017-03-07 MED ORDER — ACETAMINOPHEN 10 MG/ML IV SOLN
1000.0000 mg | Freq: Four times a day (QID) | INTRAVENOUS | Status: AC
  Administered 2017-03-07 – 2017-03-08 (×4): 1000 mg via INTRAVENOUS
  Filled 2017-03-07 (×4): qty 100

## 2017-03-07 MED ORDER — MENTHOL 3 MG MT LOZG
1.0000 | LOZENGE | OROMUCOSAL | Status: DC | PRN
Start: 2017-03-07 — End: 2017-03-12
  Filled 2017-03-07 (×2): qty 9

## 2017-03-07 MED ORDER — ALUM & MAG HYDROXIDE-SIMETH 200-200-20 MG/5ML PO SUSP
30.0000 mL | Freq: Four times a day (QID) | ORAL | Status: DC | PRN
  Administered 2017-03-07: 30 mL via ORAL
  Filled 2017-03-07: qty 30

## 2017-03-07 NOTE — Progress Notes (Signed)
ANTICOAGULATION CONSULT NOTE  Pharmacy Consult:  Heparin Indication:  History of recurrent PE   Patient Measurements: Height: 5\' 1"  (154.9 cm) Weight: 217 lb 1.6 oz (98.5 kg) IBW/kg (Calculated) : 47.8 Heparin Dosing Weight: 72 kg  Vital Signs: Temp: 98.4 F (36.9 C) (07/01 1249) Temp Source: Oral (07/01 1249) BP: 132/76 (07/01 1249) Pulse Rate: 102 (07/01 1249)  Labs:  Recent Labs  03/04/17 1918  03/05/17 1900 03/06/17 0200 03/06/17 0359 03/06/17 1600 03/07/17 0725  HGB  --   < > 13.7  --  13.2  --  12.8  HCT  --   < > 40.1  --  40.0  --  37.8  PLT  --   < > 319  --  352  --  271  APTT 87*  --   --  80*  --   --   --   HEPARINUNFRC  --   --   --  0.42  --  0.63 0.48  CREATININE  --   --   --   --  0.80  --  0.69  < > = values in this interval not displayed.  Estimated Creatinine Clearance: 105.5 mL/min (by C-G formula based on SCr of 0.69 mg/dL).   Assessment: 20 YOF presented with nausea, vomiting and abdominal pain.  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  She was transitioned to Lovenox on admission and then back on Xarelto on 03/02/17.  Given change in plan for surgery, Pharmacy consulted to transition patient to IV heparin.    Heparin level therapeutic at 0.49 on 1400 units/hr s/p intra-abdominal surgery/repair on 06/29. CBC stable.   Goal of Therapy:  Heparin level 0.3-0.7 units/ml Monitor platelets by anticoagulation protocol: Yes    Plan:  - Continue heparin infusion at 1400 units/hr  - Daily heparin level and CBC - Follow up plans for anticoagulation when stable  Rober Minion, PharmD., MS Clinical Pharmacist Pager:  [phone] Thank you for allowing pharmacy to be part of this patients care team.  03/07/2017 12:51 PM

## 2017-03-07 NOTE — Progress Notes (Signed)
Patient ID: [name], female   DOB: 04/22/1981, 36 y.o.   MRN: [id number]  [facility] Surgery Progress Note  2 Days Post-Op  Subjective: CC- s/p ex lap Main complaint today is NG tube. States that her throat is very sore. Abdominal pain well controlled with PCA. She is passing some flatus. No BM. Did not get out of bed yesterday.   Objective: Vital signs in last 24 hours: Temp:  [98.4 F (36.9 C)-100.1 F (37.8 C)] 99.3 F (37.4 C) (07/01 0502) Pulse Rate:  [106-118] 106 (07/01 0502) Resp:  [14-19] 17 (07/01 0800) BP: (117-143)/(72-94) 122/72 (07/01 0502) SpO2:  [93 %-98 %] 94 % (07/01 0800) Last BM Date: 04/02/17  Intake/Output from previous day: 06/30 0701 - 07/01 0700 In: 1209 [I.V.:909; IV Piggyback:300] Out: 1875 [Urine:1650; Emesis/NG output:200; Drains:25] Intake/Output this shift: No intake/output data recorded.  PE: Gen:  Alert, NAD, pleasant HEENT: EOM's intact, pupils equal  Card:  RRR, no M/G/R heard Pulm:  CTAB, no W/R/R, effort normal Abd: obese, soft, tender around vac, mild distension, hypoactive BS, midline wound with vac in place Ext:  No erythema, edema, or tenderness BUE/BLE  Psych: A&Ox3  Skin: no rashes noted, warm and dry  Lab Results:   Recent Labs  03/06/17 0359 03/07/17 0725  WBC 10.9* 15.4*  HGB 13.2 12.8  HCT 40.0 37.8  PLT 352 271   BMET  Recent Labs  03/06/17 0359 03/07/17 0725  NA 140 134*  K 3.4* 3.5  CL 107 103  CO2 27 25  GLUCOSE 107* 110*  BUN <5* <5*  CREATININE 0.80 0.69  CALCIUM 7.9* 7.7*   PT/INR No results for input(s): LABPROT, INR in the last 72 hours. CMP     Component Value Date/Time   NA 134 (L) 03/07/2017 0725   K 3.5 03/07/2017 0725   CL 103 03/07/2017 0725   CO2 25 03/07/2017 0725   GLUCOSE 110 (H) 03/07/2017 0725   BUN <5 (L) 03/07/2017 0725   CREATININE 0.69 03/07/2017 0725   CALCIUM 7.7 (L) 03/07/2017 0725   PROT 7.5 02/24/2017 0633   ALBUMIN 4.0 02/24/2017 0633   AST 22  02/24/2017 0633   ALT 17 02/24/2017 0633   ALKPHOS 71 02/24/2017 0633   BILITOT 1.0 02/24/2017 0633   GFRNONAA >60 03/07/2017 0725   GFRAA >60 03/07/2017 0725   Lipase     Component Value Date/Time   LIPASE 19 02/23/2017 1758       Studies/Results: Dg Chest Port 1 View  Result Date: 03/05/2017 CLINICAL DATA:  Central venous catheter placement EXAM: PORTABLE CHEST 1 VIEW COMPARISON:  None. FINDINGS: Central venous catheter tip overlies the lower SVC. There is shallow lung inflation with bibasilar opacities. No focal consolidation. No pleural effusion or pneumothorax. IMPRESSION: Central venous catheter tip in the lower SVC. Hypoinflation with bibasilar opacities, likely atelectasis. Electronically Signed   By: Ulyses Jarred M.D.   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 Donne Hazel - POD 2 - NG tube with low output, passing some flatus - vac changes MWF - WBC up to 15.4  ID - vanco perioperative FEN - IVF, clamp NG tube/give sips of clears from the floor VTE - SCDs, heparin  Plan - clamp NG tube and trial sips of clears from the floor. continue PCA and IV tylenol today. Encourage more mobilization today. Will consult PT. Needs IS. Labs in AM.   LOS: 11 days    Jerrye Beavers , [facility] Surgery 03/07/2017, 10:13 AM Pager: [phone] Consults: [phone] Mon-Fri 7:00 am-4:30  pm Sat-Sun 7:00 am-11:30 am

## 2017-03-07 NOTE — Progress Notes (Signed)
Pt Morphine PCA changed  With witness by Marita Kansas, RN and wasted 3 ml of Morphine in the sink.

## 2017-03-08 LAB — GLUCOSE, CAPILLARY
GLUCOSE-CAPILLARY: 118 mg/dL — AB (ref 65–99)
GLUCOSE-CAPILLARY: 88 mg/dL (ref 65–99)
Glucose-Capillary: 126 mg/dL — ABNORMAL HIGH (ref 65–99)

## 2017-03-08 LAB — BASIC METABOLIC PANEL
ANION GAP: 3 — AB (ref 5–15)
BUN: <5 mg/dL — ABNORMAL LOW (ref 6–20)
CO2: 26 mmol/L (ref 22–32)
Calcium: 7.7 mg/dL — ABNORMAL LOW (ref 8.9–10.3)
Chloride: 106 mmol/L (ref 101–111)
Creatinine, Ser: 0.64 mg/dL (ref 0.44–1.00)
GFR calc Af Amer: >60 mL/min (ref >60)
Glucose, Bld: 111 mg/dL — ABNORMAL HIGH (ref 65–99)
POTASSIUM: 3.5 mmol/L (ref 3.5–5.1)
SODIUM: 135 mmol/L (ref 135–145)

## 2017-03-08 LAB — CBC
HCT: 30.4 % — ABNORMAL LOW (ref 36.0–46.0)
Hemoglobin: 10.2 g/dL — ABNORMAL LOW (ref 12.0–15.0)
MCH: 31.6 pg (ref 26.0–34.0)
MCHC: 33.6 g/dL (ref 30.0–36.0)
MCV: 94.1 fL (ref 78.0–100.0)
Platelets: 275 10*3/uL (ref 150–400)
RBC: 3.23 MIL/uL — AB (ref 3.87–5.11)
RDW: 12.1 % (ref 11.5–15.5)
WBC: 12.8 10*3/uL — ABNORMAL HIGH (ref 4.0–10.5)

## 2017-03-08 LAB — HEPARIN LEVEL (UNFRACTIONATED): HEPARIN UNFRACTIONATED: 0.36 [IU]/mL (ref 0.30–0.70)

## 2017-03-08 MED ORDER — ACETAMINOPHEN 325 MG PO TABS
650.0000 mg | ORAL_TABLET | Freq: Four times a day (QID) | ORAL | Status: DC | PRN
  Administered 2017-03-08 – 2017-03-09 (×2): 650 mg via ORAL
  Filled 2017-03-08 (×2): qty 2

## 2017-03-08 MED ORDER — SIMETHICONE 80 MG PO CHEW
80.0000 mg | CHEWABLE_TABLET | Freq: Once | ORAL | Status: AC
  Administered 2017-03-08: 80 mg via ORAL
  Filled 2017-03-08: qty 1

## 2017-03-08 MED ORDER — SODIUM CHLORIDE 0.9% FLUSH: 10.0000 mL | INTRAVENOUS | Status: DC | PRN

## 2017-03-08 NOTE — Progress Notes (Signed)
Assisted with first VAC change. Midline wound:   20 cm x 7cm x 6 cm. Small amount of bleeding, wound clean with good granulation tissue.  Patient tolerated well. Continue VAC changes MWF.   Brigid Re , [facility] 03/08/2017, 2:37 PM Pager: [phone] Consults: [phone] Mon-Fri 7:00 am-4:30 pm Sat-Sun 7:00 am-11:30 am

## 2017-03-08 NOTE — Progress Notes (Signed)
3 Days Post-Op   Subjective/Chief Complaint: ABDOMINAL PAIN Pt with flatus    Objective: Vital signs in last 24 hours: Temp:  [98.4 F (36.9 C)-100.8 F (38.2 C)] 98.8 F (37.1 C) (07/02 0640) Pulse Rate:  [75-108] 75 (07/02 0640) Resp:  [15-20] 15 (07/02 0824) BP: (112-132)/(69-77) 113/69 (07/02 0640) SpO2:  [94 %-99 %] 98 % (07/02 0824) Last BM Date: 03/04/17  Intake/Output from previous day: 07/01 0701 - 07/02 0700 In: 4380.5 [I.V.:3980.5; IV Piggyback:400] Out: 2100 [Urine:1950; Emesis/NG output:100; Drains:50] Intake/Output this shift: No intake/output data recorded.  Incision/Wound:vac in place sore ND  Lab Results:   Recent Labs  03/07/17 0725 03/08/17 0555  WBC 15.4* 12.8*  HGB 12.8 10.2*  HCT 37.8 30.4*  PLT 271 275   BMET  Recent Labs  03/07/17 0725 03/08/17 0555  NA 134* 135  K 3.5 3.5  CL 103 106  CO2 25 26  GLUCOSE 110* 111*  BUN <5* <5*  CREATININE 0.69 0.64  CALCIUM 7.7* 7.7*   PT/INR No results for input(s): LABPROT, INR in the last 72 hours. ABG No results for input(s): PHART, HCO3 in the last 72 hours.  Invalid input(s): PCO2, PO2  Studies/Results: No results found.  Anti-infectives: Anti-infectives    Start     Dose/Rate Route Frequency Ordered Stop   03/05/17 0845  vancomycin (VANCOCIN) IVPB 1000 mg/200 mL premix     1,000 mg 200 mL/hr over 60 Minutes Intravenous To ShortStay Surgical 03/05/17 0834 03/05/17 1030   03/05/17 0828  vancomycin (VANCOCIN) 1-5 GM/200ML-% IVPB    Comments:  [name], [name]   : cabinet override      03/05/17 0828 03/05/17 0930      Assessment/Plan: s/p Procedure(s): EXPLORATORY LAPAROTOMY (N/A) LYSIS OF ADHESIONs (N/A) Advance diet OOB  Continue heparin   LOS: 12 days    [name] A. 03/08/2017

## 2017-03-08 NOTE — Evaluation (Signed)
Physical Therapy Evaluation Patient Details Name: [name] MRN: [id number] DOB: 11/14/1980 Today's Date: 03/08/2017   History of Present Illness  This is a 36 year old morbidly obese female with PMH of TAR hernia repair last year, cholecystectomy a month ago, recurrent pulmonary embolism, hypertension, hypothyroidism admitted with SBO and failed TAR hernia repair, now s/p laparotomy, lysis of adhesions times 2 hours on 03/05/17.  Clinical Impression  Patient presents with decreased mobility due to pain and limited activity tolerance.  Also difficulty with bed mobility using rails here due to abdominal pain.  Will benefit from skilled PT to address issues prior to d/c to home with family support.  Likely not to need follow up PT.    Follow Up Recommendations No PT follow up;Supervision for mobility/OOB    Equipment Recommendations  Rolling walker with 5" wheels    Recommendations for Other Services       Precautions / Restrictions Precautions Precautions: Fall      Mobility  Bed Mobility               General bed mobility comments: up sitting EOB upon my entry, describes that she gets up from sidelying using railing  Transfers Overall transfer level: Needs assistance Equipment used: Rolling walker (2 wheeled) Transfers: Sit to/from Stand Sit to Stand: Supervision         General transfer comment: safe technique  Ambulation/Gait Ambulation/Gait assistance: Supervision Ambulation Distance (Feet): 500 Feet Assistive device: Rolling walker (2 wheeled) Gait Pattern/deviations: Step-through pattern;Decreased stride length;Shuffle     General Gait Details: eager to walk to stimulate bowels.  Cues for safety, assist wtih IV  Stairs            Wheelchair Mobility    Modified Rankin (Stroke Patients Only)       Balance Overall balance assessment: Needs assistance Sitting-balance support: No upper extremity supported Sitting balance-Leahy Scale: Good     Standing balance support: Bilateral upper extremity supported;No upper extremity supported Standing balance-Leahy Scale: Fair Standing balance comment: initially standing no walker, requested walker for ambulation                             Pertinent Vitals/Pain Pain Assessment: Faces Faces Pain Scale: Hurts even more Pain Location: abdomen Pain Descriptors / Indicators: Discomfort;Operative site guarding Pain Intervention(s): Monitored during session;PCA encouraged;Repositioned    Home Living Family/patient expects to be discharged to:: Private residence Living Arrangements: Spouse/significant other;Children Available Help at Discharge: Family Type of Home: House Home Access: Level entry     Home Layout: Two level;Able to live on main level with bedroom/bathroom Home Equipment: Bedside commode      Prior Function Level of Independence: Independent         Comments[place]     [employer]        Extremity/Trunk Assessment        Lower Extremity Assessment Lower Extremity Assessment: Overall WFL for tasks assessed       Communication   Communication: No difficulties  Cognition Arousal/Alertness: Awake/alert Behavior During Therapy: WFL for tasks assessed/performed Overall Cognitive Status: Within Functional Limits for tasks assessed                                        General Comments      Exercises     Assessment/Plan  PT Assessment Patient needs continued PT services  PT Problem List Decreased mobility;Decreased activity tolerance;Decreased knowledge of use of DME;Pain;Decreased safety awareness       PT Treatment Interventions DME instruction;Therapeutic activities;Patient/family education;Gait training;Therapeutic exercise;Balance training;Functional mobility training    PT Goals (Current goals can be found in the Care Plan section)  Acute Rehab PT Goals Patient Stated Goal: To  go home PT Goal Formulation: With patient Time For Goal Achievement: 03/12/17 Potential to Achieve Goals: Good    Frequency Min 3X/week   Barriers to discharge        Co-evaluation               AM-PAC PT "6 Clicks" Daily Activity  Outcome Measure Difficulty turning over in bed (including adjusting bedclothes, sheets and blankets)?: Total Difficulty moving from lying on back to sitting on the side of the bed? : Total Difficulty sitting down on and standing up from a chair with arms (e.g., wheelchair, bedside commode, etc,.)?: A Little Help needed moving to and from a bed to chair (including a wheelchair)?: A Little Help needed walking in hospital room?: A Little Help needed climbing 3-5 steps with a railing? : A Lot 6 Click Score: 13    End of Session   Activity Tolerance: Patient tolerated treatment well Patient left: in chair;with call bell/phone within reach   PT Visit Diagnosis: Difficulty in walking, not elsewhere classified (R26.2)    Time: 1040-1104 PT Time Calculation (min) (ACUTE ONLY): 24 min   Charges:   PT Evaluation $PT Eval Moderate Complexity: 1 Procedure PT Treatments $Gait Training: 8-22 mins   PT G CodesMagda Kiel, [place] 03/08/2017   Reginia Naas 03/08/2017, 12:13 PM

## 2017-03-08 NOTE — Progress Notes (Signed)
ANTICOAGULATION CONSULT NOTE  Pharmacy Consult:  Heparin Indication:  History of recurrent PE  Patient Measurements: Height: 5\' 1"  (154.9 cm) Weight: 217 lb 1.6 oz (98.5 kg) IBW/kg (Calculated) : 47.8 Heparin Dosing Weight: 72 kg  Assessment: 36 YOF presented with nausea, vomiting and abdominal pain. On Xarelto 20 mg daily PTA for hx recurrent PE (last 2014?). Started on Lovenox 6/22 - 6/26 and then transitioned to heparin on 6/27 for planned surgery. Now s/p surgery on 6/29 to continue heparin for now until taking po then to transition back to Xarelto. Heparin level remains therapeutic this am. Hgb down to 10.2 today, plts wnl.   Goal of Therapy:  Heparin level 0.3-0.7 units/ml Monitor platelets by anticoagulation protocol: Yes   Plan:  Continue heparin gtt at 1,400 units/hr Monitor daily heparin level, CBC, s/s of bleed F/U Xarelto restart  Elenor Quinones, PharmD, [facility] Clinical Pharmacist Pager [phone] 03/08/2017 8:41 AM

## 2017-03-09 LAB — GLUCOSE, CAPILLARY
Glucose-Capillary: 107 mg/dL — ABNORMAL HIGH (ref 65–99)
Glucose-Capillary: 108 mg/dL — ABNORMAL HIGH (ref 65–99)
Glucose-Capillary: 89 mg/dL (ref 65–99)

## 2017-03-09 LAB — CBC
HEMATOCRIT: 30.5 % — AB (ref 36.0–46.0)
HEMOGLOBIN: 10.3 g/dL — AB (ref 12.0–15.0)
MCH: 32.4 pg (ref 26.0–34.0)
MCHC: 33.8 g/dL (ref 30.0–36.0)
MCV: 95.9 fL (ref 78.0–100.0)
Platelets: 330 10*3/uL (ref 150–400)
RBC: 3.18 MIL/uL — ABNORMAL LOW (ref 3.87–5.11)
RDW: 12.3 % (ref 11.5–15.5)
WBC: 12 10*3/uL — AB (ref 4.0–10.5)

## 2017-03-09 LAB — HEPARIN LEVEL (UNFRACTIONATED)
HEPARIN UNFRACTIONATED: 0.4 [IU]/mL (ref 0.30–0.70)
Heparin Unfractionated: 0.25 IU/mL — ABNORMAL LOW (ref 0.30–0.70)

## 2017-03-09 MED ORDER — LEVOTHYROXINE SODIUM 25 MCG PO TABS
125.0000 ug | ORAL_TABLET | Freq: Every day | ORAL | Status: DC
  Administered 2017-03-10 – 2017-03-12 (×3): 125 ug via ORAL
  Filled 2017-03-09 (×3): qty 1

## 2017-03-09 MED ORDER — TRAMADOL HCL 50 MG PO TABS
50.0000 mg | ORAL_TABLET | Freq: Four times a day (QID) | ORAL | Status: DC
  Administered 2017-03-09 – 2017-03-12 (×12): 50 mg via ORAL
  Filled 2017-03-09 (×12): qty 1

## 2017-03-09 MED ORDER — MORPHINE SULFATE (PF) 4 MG/ML IV SOLN
2.0000 mg | INTRAVENOUS | Status: DC | PRN
  Administered 2017-03-09 (×2): 2 mg via INTRAVENOUS
  Filled 2017-03-09 (×2): qty 1

## 2017-03-09 MED ORDER — ACETAMINOPHEN 325 MG PO TABS
650.0000 mg | ORAL_TABLET | Freq: Four times a day (QID) | ORAL | Status: DC
  Administered 2017-03-09 – 2017-03-12 (×12): 650 mg via ORAL
  Filled 2017-03-09 (×12): qty 2

## 2017-03-09 MED ORDER — DIPHENHYDRAMINE HCL 25 MG PO CAPS
25.0000 mg | ORAL_CAPSULE | Freq: Four times a day (QID) | ORAL | Status: DC | PRN
  Administered 2017-03-09 – 2017-03-11 (×2): 25 mg via ORAL
  Filled 2017-03-09 (×3): qty 1

## 2017-03-09 MED ORDER — PROMETHAZINE HCL 25 MG PO TABS
12.5000 mg | ORAL_TABLET | Freq: Four times a day (QID) | ORAL | Status: DC | PRN
  Administered 2017-03-09 – 2017-03-10 (×2): 12.5 mg via ORAL
  Filled 2017-03-09 (×2): qty 1

## 2017-03-09 MED ORDER — PANTOPRAZOLE SODIUM 40 MG PO TBEC
40.0000 mg | DELAYED_RELEASE_TABLET | Freq: Every day | ORAL | Status: DC
  Administered 2017-03-10 – 2017-03-12 (×3): 40 mg via ORAL
  Filled 2017-03-09 (×3): qty 1

## 2017-03-09 MED ORDER — PANTOPRAZOLE SODIUM 40 MG PO TBEC: 40.0000 mg | DELAYED_RELEASE_TABLET | Freq: Every day | ORAL | Status: DC

## 2017-03-09 MED ORDER — OXYCODONE HCL 5 MG PO TABS
5.0000 mg | ORAL_TABLET | ORAL | Status: DC | PRN
  Administered 2017-03-10 – 2017-03-11 (×5): 10 mg via ORAL
  Filled 2017-03-09 (×5): qty 2

## 2017-03-09 NOTE — Progress Notes (Signed)
Witnessed waste of morphine 40ml with Latricia Heft in sink.

## 2017-03-09 NOTE — Progress Notes (Signed)
Per MD order, central line removed. IV cathter intact. Vaseline pressure gauze to site, pressure held x 5 min, no bleeding to site. Pt instructed not to get out of bed for 30 min after the removal of the central line. Instucted to keep dressing CDI x 24hours, if bleeding occurs hold pressure, if bleeding does not stop contact MD or go to the ED. Pt verbalized understanding and did not have any questions. Gerhart Ruggieri M  

## 2017-03-09 NOTE — Progress Notes (Signed)
Patient with a temperature of 101.3, Dr. Kieth Brightly paged awaiting call back.

## 2017-03-09 NOTE — Progress Notes (Signed)
Patient's morphine PCA discontinued and wasted remaining 22 mls to the sink and witnessed with nurse Baird Cancer.

## 2017-03-09 NOTE — Progress Notes (Signed)
ANTICOAGULATION CONSULT NOTE - Follow Up Consult  Pharmacy Consult for heparin Indication: h/o recurrent PE  Labs:  Recent Labs  03/07/17 0725 03/08/17 0555 03/09/17 0451  HGB 12.8 10.2*  --   HCT 37.8 30.4*  --   PLT 271 275  --   HEPARINUNFRC 0.48 0.36 0.25*  CREATININE 0.69 0.64  --     Assessment: 36yo female now below goal on heparin after several levels at goal though had been trending down, could be Xarelto clearing.  Goal of Therapy:  Heparin level 0.3-0.7 units/ml   Plan:  Will increase heparin gtt by 2 units/kg/hr to 1600 units/hr and check level in 6hr.  Wynona Neat, PharmD, BCPS  03/09/2017,6:10 AM

## 2017-03-09 NOTE — Progress Notes (Signed)
[facility]/Trauma Progress Note  4 Days Post-Op   Subjective:  CC: abdominal pain  Pt states her pain is improved since surgery. Her pain is well controlled. She is tolerating clears and having flatus. No nausea or vomiting. Tmax overnight 100.3. No cough, SOB, chest pain, dysuria.   Objective: Vital signs in last 24 hours: Temp:  [98.8 F (37.1 C)-100.7 F (38.2 C)] 99.2 F (37.3 C) (07/03 0651) Pulse Rate:  [94-107] 99 (07/03 0529) Resp:  [15-22] 22 (07/03 0725) BP: (113-138)/(62-84) 119/76 (07/03 0529) SpO2:  [94 %-98 %] 95 % (07/03 0725) Last BM Date: 03/04/17  Intake/Output from previous day: 07/02 0701 - 07/03 0700 In: 2662.4 [I.V.:2662.4] Out: 3762 [Urine:3200; Drains:60] Intake/Output this shift: No intake/output data recorded.  PE: Gen:  Alert, NAD, pleasant Card:  RRR, no M/G/R heard Pulm:  CTAB, no W/R/R, effort normal Abd: obese, soft, tender around vac, no distension, + BS, midline wound with vac in place Ext:  No erythema, edema, or tenderness BLE  Psych: A&Ox3  Skin: no rashes noted, warm and dry  Lab Results:   Recent Labs  03/08/17 0555 03/09/17 0451  WBC 12.8* 12.0*  HGB 10.2* 10.3*  HCT 30.4* 30.5*  PLT 275 330   BMET  Recent Labs  03/07/17 0725 03/08/17 0555  NA 134* 135  K 3.5 3.5  CL 103 106  CO2 25 26  GLUCOSE 110* 111*  BUN <5* <5*  CREATININE 0.69 0.64  CALCIUM 7.7* 7.7*   PT/INR No results for input(s): LABPROT, INR in the last 72 hours. CMP     Component Value Date/Time   NA 135 03/08/2017 0555   K 3.5 03/08/2017 0555   CL 106 03/08/2017 0555   CO2 26 03/08/2017 0555   GLUCOSE 111 (H) 03/08/2017 0555   BUN <5 (L) 03/08/2017 0555   CREATININE 0.64 03/08/2017 0555   CALCIUM 7.7 (L) 03/08/2017 0555   PROT 7.5 02/24/2017 0633   ALBUMIN 4.0 02/24/2017 0633   AST 22 02/24/2017 0633   ALT 17 02/24/2017 0633   ALKPHOS 71 02/24/2017 0633   BILITOT 1.0 02/24/2017 0633   GFRNONAA >60 03/08/2017 0555   GFRAA >60 03/08/2017 0555   Lipase     Component Value Date/Time   LIPASE 19 02/23/2017 1758    Studies/Results: No results found.  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 Donne Hazel - vac changes MWF - WBC at 12.0, trending down - Tmax 100.3  ID - vanco perioperative FEN - fulls VTE - SCDs, heparin  Plan - fulls, d/c PCA, PO pain meds   LOS: 13 days    Kalman Drape , [facility] Surgery 03/09/2017, 10:49 AM Pager: [phone] Consults: [phone] Mon-Fri 7:00 am-4:30 pm Sat-Sun 7:00 am-11:30 am

## 2017-03-09 NOTE — Progress Notes (Signed)
ANTICOAGULATION CONSULT NOTE  Pharmacy Consult:  Heparin Indication:  History of recurrent PE  Patient Measurements: Height: 5\' 1"  (154.9 cm) Weight: 217 lb 1.6 oz (98.5 kg) IBW/kg (Calculated) : 47.8 Heparin Dosing Weight: 72 kg  Assessment: 36 YOF presented with nausea, vomiting and abdominal pain. On Xarelto 20 mg daily PTA for hx recurrent PE (last 2014?). Started on Lovenox 6/22 - 6/26 and then transitioned to heparin on 6/27 for planned surgery. Now s/p surgery on 6/29 to continue heparin for now until taking po then to transition back to Xarelto. Heparin level therapeutic at 0.4. Hgb low but stable at 10.3, plts wnl.   Goal of Therapy:  Heparin level 0.3-0.7 units/ml Monitor platelets by anticoagulation protocol: Yes   Plan:  Continue heparin gtt at 1,600 units/hr Monitor daily heparin level, CBC, s/s of bleed F/U restart of [place], PharmD, [facility] Clinical Pharmacist Pager [phone] 03/09/2017 12:50 PM

## 2017-03-10 LAB — CBC
HEMATOCRIT: 30.7 % — AB (ref 36.0–46.0)
HEMOGLOBIN: 10.6 g/dL — AB (ref 12.0–15.0)
MCH: 32.9 pg (ref 26.0–34.0)
MCHC: 34.5 g/dL (ref 30.0–36.0)
MCV: 95.3 fL (ref 78.0–100.0)
Platelets: 355 10*3/uL (ref 150–400)
RBC: 3.22 MIL/uL — AB (ref 3.87–5.11)
RDW: 12.5 % (ref 11.5–15.5)
WBC: 14.1 10*3/uL — ABNORMAL HIGH (ref 4.0–10.5)

## 2017-03-10 LAB — GLUCOSE, CAPILLARY
GLUCOSE-CAPILLARY: 105 mg/dL — AB (ref 65–99)
GLUCOSE-CAPILLARY: 80 mg/dL (ref 65–99)
GLUCOSE-CAPILLARY: 93 mg/dL (ref 65–99)
Glucose-Capillary: 82 mg/dL (ref 65–99)

## 2017-03-10 LAB — HEPARIN LEVEL (UNFRACTIONATED): HEPARIN UNFRACTIONATED: 0.26 [IU]/mL — AB (ref 0.30–0.70)

## 2017-03-10 MED ORDER — RIVAROXABAN 20 MG PO TABS
20.0000 mg | ORAL_TABLET | Freq: Every day | ORAL | Status: DC
  Administered 2017-03-10 – 2017-03-11 (×2): 20 mg via ORAL
  Filled 2017-03-10 (×2): qty 1

## 2017-03-10 MED ORDER — SENNA 8.6 MG PO TABS
1.0000 | ORAL_TABLET | Freq: Two times a day (BID) | ORAL | Status: DC
  Administered 2017-03-10 – 2017-03-12 (×5): 8.6 mg via ORAL
  Filled 2017-03-10 (×5): qty 1

## 2017-03-10 NOTE — Care Management Note (Signed)
36 yo F admitted with SBO and failed TAR hernia repair, now s/p laparotomy, lysis of adhesions times 2 hours   PT recommended a RW.  Met with pt. She plans to return home with the support of her husband. She reports that she has a RW.  No D/C needs identified at this time.

## 2017-03-10 NOTE — Progress Notes (Signed)
Physical Therapy Treatment Patient Details Name: [name] MRN: [id number] DOB: [birth year] Today's Date: 03/10/2017    History of Present Illness This is a 36 year old morbidly obese female with PMH of TAR hernia repair last year, cholecystectomy a month ago, recurrent pulmonary embolism, hypertension, hypothyroidism admitted with SBO and failed TAR hernia repair, now s/p laparotomy, lysis of adhesions times 2 hours on 03/05/17.    PT Comments    Pt progressing very well. Focused on bed mobility with bed flat and completed stair negotiation. Decreased freq due to pt walking freq with moblity team. Acute PT to follow.   Follow Up Recommendations  No PT follow up;Supervision for mobility/OOB     Equipment Recommendations  None recommended by PT    Recommendations for Other Services       Precautions / Restrictions Precautions Precautions: Other (comment) Precaution Comments: wound vac Restrictions Weight Bearing Restrictions: No    Mobility  Bed Mobility Overal bed mobility: Needs Assistance Bed Mobility: Rolling;Sidelying to Sit;Sit to Sidelying Rolling: Min guard Sidelying to sit: Min guard     Sit to sidelying: Min guard General bed mobility comments: v/c's on logroll technique with HOB flat in preperation for bed at home, increased time and used pillow to splint abdomen to, v/c's but no physical assist  Transfers Overall transfer level: Needs assistance Equipment used: None Transfers: Sit to/from Stand Sit to Stand: Supervision         General transfer comment: increased time, but no physical assist required  Ambulation/Gait Ambulation/Gait assistance: Supervision Ambulation Distance (Feet): 200 Feet Assistive device: None Gait Pattern/deviations: Step-through pattern;Wide base of support Gait velocity: dec Gait velocity interpretation: Below normal speed for age/gender General Gait Details: slower cadence and guarded but fluid gait  pattern   Stairs Stairs: Yes   Stair Management: One rail Left;Step to pattern Number of Stairs: 3 General stair comments: to mimic home, slow but steady  Wheelchair Mobility    Modified Rankin (Stroke Patients Only)       Balance Overall balance assessment: No apparent balance deficits (not formally assessed)                                          Cognition Arousal/Alertness: Awake/alert Behavior During Therapy: WFL for tasks assessed/performed Overall Cognitive Status: Within Functional Limits for tasks assessed                                        Exercises      General Comments        Pertinent Vitals/Pain Pain Assessment: 0-10 Pain Score: 4  Pain Location: abd Pain Descriptors / Indicators: Discomfort Pain Intervention(s): Monitored during session    Home Living                      Prior Function            PT Goals (current goals can now be found in the care plan section) Acute Rehab PT Goals Patient Stated Goal: go home soon Progress towards PT goals: Progressing toward goals    Frequency    Min 2X/week      PT Plan Frequency needs to be updated    Co-evaluation              AM-PAC  PT "6 Clicks" Daily Activity  Outcome Measure  Difficulty turning over in bed (including adjusting bedclothes, sheets and blankets)?: A Little Difficulty moving from lying on back to sitting on the side of the bed? : A Little Difficulty sitting down on and standing up from a chair with arms (e.g., wheelchair, bedside commode, etc,.)?: None Help needed moving to and from a bed to chair (including a wheelchair)?: None Help needed walking in hospital room?: None Help needed climbing 3-5 steps with a railing? : A Little 6 Click Score: 21    End of Session   Activity Tolerance: Patient tolerated treatment well Patient left: in bed;with call bell/phone within reach Nurse Communication: Mobility status PT  Visit Diagnosis: Difficulty in walking, not elsewhere classified (R26.2)     Time: 3361-2244 PT Time Calculation (min) (ACUTE ONLY): 15 min  Charges:  $Gait Training: 8-22 mins                    G Codes:      Kittie Plater, PT, DPT Pager #: [phone] Office #: [phone]    [place] 03/10/2017, 12:21 PM

## 2017-03-10 NOTE — Progress Notes (Signed)
5 Days Post-Op  Subjective: Slowly feeling better.  Tolerating liquids.  Passing flatus but no stool. Hungry and wants to advance diet. Fevers yesterday.  Afebrile now.  Central line removed yesterday. Remains on heparin drip because of history of PE. Plan switch over to Xarelto today, she apparently was taking this at home.  Objective: Vital signs in last 24 hours: Temp:  [98.9 F (37.2 C)-101.3 F (38.5 C)] 99.7 F (37.6 C) (07/04 0532) Pulse Rate:  [93-100] 96 (07/04 0532) Resp:  [17-18] 17 (07/04 0532) BP: (126-141)/(79-88) 132/88 (07/04 0532) SpO2:  [100 %] 100 % (07/04 0532) Last BM Date: 03/03/17  Intake/Output from previous day: 07/03 0701 - 07/04 0700 In: 1764.1 [P.O.:1375; I.V.:389.1] Out: 7322 [GURKY:7062; Drains:100] Intake/Output this shift: No intake/output data recorded.    PE: Gen: Alert, NAD, pleasant Card: RRR, no [name]/G/R heard Pulm: CTAB, no W/R/R, effort normal Abd: obese, soft, tender around vac, no distension, + BS, midline wound with vac in place skin looks clean.  Drainage thin. Ext: No erythema, edema, or tenderness BLE  Psych: A&Ox3  Skin: no rashes noted, warm and dry    Lab Results:  Results for orders placed or performed during the hospital encounter of 02/23/17 (from the past 24 hour(s))  Heparin level (unfractionated)     Status: None   Collection Time: 03/09/17 12:11 PM  Result Value Ref Range   Heparin Unfractionated 0.40 0.30 - 0.70 IU/mL  Glucose, capillary     Status: Abnormal   Collection Time: 03/09/17  5:30 PM  Result Value Ref Range   Glucose-Capillary 108 (H) 65 - 99 mg/dL  Glucose, capillary     Status: None   Collection Time: 03/10/17 12:09 AM  Result Value Ref Range   Glucose-Capillary 80 65 - 99 mg/dL     Studies/Results: No results found.  Marland Kitchen acetaminophen  650 mg Oral Q6H  . levothyroxine  125 mcg Oral QAC breakfast  . pantoprazole  40 mg Oral Daily  . senna  1 tablet Oral BID  . 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  Restart Xarelto today and discontinue heparin drip Hypthyroidism Obesity  Small bowel obstruction, failed TAR hernia repair S/p laparotomy, lysis of adhesions, 03/05/17, Dr. Donne Hazel - vac changes MWF-ordered.  Discussed with RN.- WBC at 12.0, trending down - Tmax 101... Hopefully fevers will resolve now that central line out.  No other source apparent  ID - vanco perioperative FEN - advance to soft VTE - SCDs, discontinue heparin drip and switch over to xarelto.  Plan - advance diet.  @PROBHOSP @  LOS: 14 days    [name] 03/10/2017  . .prob

## 2017-03-10 NOTE — Progress Notes (Signed)
ANTICOAGULATION CONSULT NOTE  Pharmacy Consult: Xarelto Indication:  History of recurrent PE  Patient Measurements: Height: 5\' 1"  (154.9 cm) Weight: 217 lb 1.6 oz (98.5 kg) IBW/kg (Calculated) : 47.8 Heparin Dosing Weight: 72 kg  Assessment: 36 YOF presented with nausea, vomiting and abdominal pain. On Xarelto 20 mg daily PTA for hx recurrent PE (last 2014?). Started on Lovenox 6/22 - 6/26 and then transitioned to heparin on 6/27 for planned surgery. Now s/p surgery on 6/29 to continue heparin for now until taking po then to transition back to Xarelto. Heparin level therapeutic at 0.4. Hgb low but stable at 10.3, plts wnl.   Goal of Therapy:  Heparin level 0.3-0.7 units/ml Monitor platelets by anticoagulation protocol: Yes   Plan:  Stop heparin gtt Start Xarelto 20mg  PO daily Monitor CBC, s/s of bleed   [name], PharmD, BCPS Clinical Pharmacist Pager [phone] 03/10/2017 8:02 AM

## 2017-03-11 LAB — CBC
HCT: 28.3 % — ABNORMAL LOW (ref 36.0–46.0)
Hemoglobin: 9.4 g/dL — ABNORMAL LOW (ref 12.0–15.0)
MCH: 31.4 pg (ref 26.0–34.0)
MCHC: 33.2 g/dL (ref 30.0–36.0)
MCV: 94.6 fL (ref 78.0–100.0)
Platelets: 372 10*3/uL (ref 150–400)
RBC: 2.99 MIL/uL — ABNORMAL LOW (ref 3.87–5.11)
RDW: 12.5 % (ref 11.5–15.5)
WBC: 11.9 10*3/uL — AB (ref 4.0–10.5)

## 2017-03-11 LAB — GLUCOSE, CAPILLARY
GLUCOSE-CAPILLARY: 107 mg/dL — AB (ref 65–99)
GLUCOSE-CAPILLARY: 103 mg/dL — AB (ref 65–99)

## 2017-03-11 LAB — HEPARIN LEVEL (UNFRACTIONATED): Heparin Unfractionated: 1.08 IU/mL — ABNORMAL HIGH (ref 0.30–0.70)

## 2017-03-11 MED ORDER — OXYCODONE HCL 5 MG PO TABS: 5.0000 mg | ORAL_TABLET | ORAL | 0 refills | Status: DC | PRN

## 2017-03-11 MED ORDER — TRAMADOL HCL 50 MG PO TABS: 50.0000 mg | ORAL_TABLET | Freq: Four times a day (QID) | ORAL | 0 refills | Status: DC | PRN

## 2017-03-11 MED ORDER — ACETAMINOPHEN 325 MG PO TABS: 650.0000 mg | ORAL_TABLET | Freq: Four times a day (QID) | ORAL | Status: DC | PRN

## 2017-03-11 NOTE — Care Management Note (Signed)
Case Management Note  Patient Details  Name: [name] MRN: [id number] Date of Birth: 1981-07-31  Subjective/Objective:   Pt admitted on 02/23/17 with SBO, failed TAR hernia repair s/p laparotomy, LOA.  PTA, pt independent, lives at home with spouse.  CM Referral 03/11/17 for home VAC.                   Action/Plan: Will fax insurance authorization forms to [facility] for [facility] approval.  Will likely be able to obtain home VAC later today.  Will update pt with delivery information.    Expected Discharge Date:  03/11/17               Expected Discharge Plan:  [place]  In-House Referral:     Discharge planning Services  CM Consult  Post Acute Care Choice:  Home Health Choice offered to:  Patient  DME Arranged:  Vac DME Agency:  [facility]  HH Arranged:  RN [place] Agency:  [place]  Status of Service:  Completed, signed off  If discussed at [place] of Stay Meetings, dates discussed:    Additional Comments:  03/11/17 J. Hedwig Mcfall, RN, BSN Wound VAC to be delivered today before 5pm.  However, receiving [place] agency cannot staff case until 03/15/17.  Pt prefers to stay until 03/12/17 to have wound VAC dressing changed and then DC.  MD/PA aware of dc plan.    Reinaldo Raddle, RN, BSN  Trauma/Neuro ICU Case Manager [phone]

## 2017-03-11 NOTE — Progress Notes (Signed)
[facility] Progress Note  6 Days Post-Op  Subjective: CC:  Some nausea with initiation of soft diet but it is improving. Denies vomiting. Had a BM yesterday. Mobilizing and using IS.   Objective: Vital signs in last 24 hours: Temp:  [98.4 F (36.9 C)-100.2 F (37.9 C)] 98.4 F (36.9 C) (07/05 0636) Pulse Rate:  [98-99] 98 (07/05 0636) Resp:  [18] 18 (07/05 0636) BP: (126-129)/(78-87) 126/78 (07/05 0636) SpO2:  [98 %-100 %] 100 % (07/05 0636) Last BM Date: 03/10/17  Intake/Output from previous day: 07/04 0701 - 07/05 0700 In: 480 [P.O.:480] Out: 716 [Urine:600; Drains:115; Stool:1] Intake/Output this shift: No intake/output data recorded.  PE: Gen:  Alert, NAD, pleasant Pulm:  Normal effort, clear to auscultation bilaterally Abd: Soft, non-tender, non-distended, bowel sounds present, VAC in place Skin: warm and dry, no rashes  Psych: A&Ox3   Lab Results:   Recent Labs  03/10/17 0640 03/11/17 0417  WBC 14.1* 11.9*  HGB 10.6* 9.4*  HCT 30.7* 28.3*  PLT 355 372   CMP     Component Value Date/Time   NA 135 03/08/2017 0555   K 3.5 03/08/2017 0555   CL 106 03/08/2017 0555   CO2 26 03/08/2017 0555   GLUCOSE 111 (H) 03/08/2017 0555   BUN <5 (L) 03/08/2017 0555   CREATININE 0.64 03/08/2017 0555   CALCIUM 7.7 (L) 03/08/2017 0555   PROT 7.5 02/24/2017 0633   ALBUMIN 4.0 02/24/2017 0633   AST 22 02/24/2017 0633   ALT 17 02/24/2017 0633   ALKPHOS 71 02/24/2017 0633   BILITOT 1.0 02/24/2017 0633   GFRNONAA >60 03/08/2017 0555   GFRAA >60 03/08/2017 0555   Lipase     Component Value Date/Time   LIPASE 19 02/23/2017 1758   Anti-infectives: Anti-infectives    Start     Dose/Rate Route Frequency Ordered Stop   03/05/17 0845  vancomycin (VANCOCIN) IVPB 1000 mg/200 mL premix     1,000 mg 200 mL/hr over 60 Minutes Intravenous To [facility]Stay [facility] 03/05/17 0834 03/05/17 1030   03/05/17 0828  vancomycin (VANCOCIN) 1-5 GM/200ML-% IVPB    Comments:   [name], Lauren   : cabinet override      03/05/17 0828 03/05/17 0930     Assessment/Plan HTN H/o recurrent PE - restarted Xarelto 03/10/17  Hypthyroidism Obesity  Small bowel obstruction, failed TAR hernia repair S/p laparotomy, lysis of adhesions, 03/05/17,Dr. Donne Hazel - afebrile, VSS - VAC changes MWF - tolerating PO and having bowel function   ID - vanco perioperative FEN - advance to soft VTE - SCDs, Xarelto  Plan: discharge home w/ [facility] RN for [facility]  Follow up with Dr. [name] in 1-2 weeks    LOS: 15 days     [name] , [facility] 03/11/2017, 8:21 AM Pager: [phone] Consults: [phone] Mon-Fri 7:00 am-4:30 pm Sat-Sun 7:00 am-11:30 am

## 2017-03-12 LAB — CBC
HCT: 29.7 % — ABNORMAL LOW (ref 36.0–46.0)
HEMOGLOBIN: 9.8 g/dL — AB (ref 12.0–15.0)
MCH: 31.4 pg (ref 26.0–34.0)
MCHC: 33 g/dL (ref 30.0–36.0)
MCV: 95.2 fL (ref 78.0–100.0)
Platelets: 394 10*3/uL (ref 150–400)
RBC: 3.12 MIL/uL — ABNORMAL LOW (ref 3.87–5.11)
RDW: 12.6 % (ref 11.5–15.5)
WBC: 11.5 10*3/uL — ABNORMAL HIGH (ref 4.0–10.5)

## 2017-03-12 LAB — GLUCOSE, CAPILLARY
GLUCOSE-CAPILLARY: 98 mg/dL (ref 65–99)
Glucose-Capillary: 103 mg/dL — ABNORMAL HIGH (ref 65–99)

## 2017-03-12 MED ORDER — ONDANSETRON HCL 4 MG PO TABS: 4.0000 mg | ORAL_TABLET | Freq: Three times a day (TID) | ORAL | 0 refills | Status: DC | PRN

## 2017-03-12 NOTE — Discharge Summary (Signed)
[facility] Discharge Summary   Patient ID: [name] MRN: [id number] DOB/AGE: 1981/08/29 36 y.o.  Admit date: 02/23/2017 Discharge date: 03/12/2017  Discharge Diagnosis Patient Active Problem List   Diagnosis Date Noted  . Left upper quadrant pain 03/03/2017  . Generalized abdominal wall pain 03/03/2017  . Obesity, Class III, BMI 40-49.9 (morbid obesity) ([place]) 03/03/2017  . Small bowel obstruction due to adhesions ([place]) 02/24/2017  . Essential hypertension 02/24/2017  . History of pulmonary embolism 02/24/2017  . Hypothyroidism 02/24/2017  . S/P laparoscopic cholecystectomy 01/18/2017  . Post op infection 05/03/2016  . Incisional hernia 04/13/2016   Imaging: 02/24/17 CT ABD PELV W/ - Findings consistent with early small bowel obstruction with transition point in the anterior mid abdomen.  02/24/17 DG ABD - NG tube placed in the stomach below level of diaphragm   02/24/17 DG ABD - enteric contrast throughout stomach, small bowel, rectum   02/28/17 DG ABD - no significant transition of enteric contrast in the large bowel. Normal bowel gas pattern.   03/03/17 DG ABD - dilated bowel loops in the abdomen concerning for SBO  03/05/17 DG CHEST - central venous catheter in the lower SVC. Atelectasis.   Procedures Dr. Rolm Bookbinder (03/05/17) - exploratory laparotomy, lysis of adhesions  Hospital Course:   36 y.o. Female with PMH laparoscopic cholecystectomy, incisional hernia repair with mesh (TAR), PE, HTN, hypothyroidism, and obesity who presented to [facility] 6[facility]20 with acute on chronic abdominal pain and recurrent nausea/vomiting for 48 hours. Has had abdominal pain since 04/2016 that became acutely worse. Workup significant for small bowel obstruction and patient was admitted to the hospital, NG tube placed, and started on small bowel protocol with gastrografin per NG tube. Patient initially showed some improvement, bowel function returned and NG was removed 6/22. Patient  had persistent pain and failed to improve so she was taken to OR for above procedure due to concern that her previous hernia repair failed and mesh migrated. Patient tolerated the procedure well. A negative pressure device was placed on abdominal wound. Diet was advanced as tolerated.  On 03/12/17, the patient was voiding well, tolerating diet, ambulating well, pain well controlled, vital signs stable, incisions c/d/i and felt stable for discharge home.  Patient will follow up in our office in 2-4 weeks and knows to call with questions or concerns.    I have personally reviewed the patients medication history on the [place] controlled substance database.  Physical Exam: General:  Alert, NAD, pleasant, comfortable Pulm: normal effort Abd:  Soft, ND, appropriately tender, +BS, midline wound below: significantly improved granulation. One small area of tunneling at distal aspect of incision. Fascia underneath is intact. No feculent discharge.       Allergies as of 03/12/2017      Reactions   Penicillins Anaphylaxis, Hives   PATIENT HAD A PCN REACTION WITH IMMEDIATE RASH, FACIAL/TONGUE/THROAT SWELLING, SOB, OR LIGHTHEADEDNESS WITH HYPOTENSION:  #  #  #  YES  #  #  #   Reaction causing SEVERE RASH INVOLVING MUCUS MEMBRANES or SKIN NECROSIS: #  #  #  YES  #  #  #   PATIENT HAS HAD A PCN REACTION THAT REQUIRED HOSPITALIZATION:  #  #  #  YES  #  #  #  Has patient had a PCN reaction occurring within the last 10 years: No      Medication List    STOP taking these medications   docusate sodium 100 MG capsule Commonly known  as:  COLACE   doxycycline 100 MG tablet Commonly known as:  VIBRA-TABS   famotidine 20 MG tablet Commonly known as:  PEPCID   HYDROcodone-acetaminophen 5-325 MG tablet Commonly known as:  NORCO/VICODIN   oxyCODONE-acetaminophen 5-325 MG tablet Commonly known as:  PERCOCET/ROXICET   promethazine 12.5 MG tablet Commonly known as:  PHENERGAN     TAKE these medications     acetaminophen 325 MG tablet Commonly known as:  TYLENOL Take 2 tablets (650 mg total) by mouth every 6 (six) hours as needed.   dicyclomine 20 MG tablet Commonly known as:  BENTYL Take 1 tablet (20 mg total) by mouth 2 (two) times daily.   levothyroxine 125 MCG tablet Commonly known as:  SYNTHROID, LEVOTHROID Take 125 mcg by mouth daily before breakfast.   lisinopril 5 MG tablet Commonly known as:  PRINIVIL,ZESTRIL Take 5 mg by mouth daily.   metoprolol tartrate 50 MG tablet Commonly known as:  LOPRESSOR Take 50 mg by mouth 2 (two) times daily.   ondansetron 4 MG disintegrating tablet Commonly known as:  ZOFRAN ODT Take 1 tablet (4 mg total) by mouth every 8 (eight) hours as needed for nausea or vomiting. What changed:  Another medication with the same name was removed. Continue taking this medication, and follow the directions you see here.   ondansetron 4 MG tablet Commonly known as:  ZOFRAN Take 1 tablet (4 mg total) by mouth every 8 (eight) hours as needed for nausea or vomiting. What changed:  Another medication with the same name was removed. Continue taking this medication, and follow the directions you see here.   oxyCODONE 5 MG immediate release tablet Commonly known as:  Oxy IR/ROXICODONE Take 1-2 tablets (5-10 mg total) by mouth every 4 (four) hours as needed (5mg  for moderate pain, 10mg  for severe pain).   XARELTO 20 MG Tabs tablet Generic drug:  rivaroxaban Take 20 mg by mouth every evening.        Follow-up Information    Berkley Harvey, NP. Call in 1 day(s).   Specialty:  Nurse Practitioner Why:  for follow up appointment Contact information: [place] [address] [phone]        Coralie Keens, MD Follow up in 1 week(s).   Specialty:  General Surgery Contact information: [address] [place] [address] [phone]        [place], [place] Follow up.   Specialty:  Home Health  Services Why:  Home Health RN to follow up with you on MWF for wound VAC dressing changes.   Contact information: [place] [address] [phone]           Signed: Obie Dredge, [facility] 03/12/2017, 1:42 PM Pager: [phone] Consults: [phone] Mon-Fri 7:00 am-4:30 pm Sat-Sun 7:00 am-11:30 am

## 2017-03-12 NOTE — Progress Notes (Signed)
Discharge home. Home discharge instruction given, Vac dressing changed prior to discharge. PA checked the wound prior to application of new vac. HH needs arranged by the case manager.

## 2017-03-15 ENCOUNTER — Emergency Department (HOSPITAL_COMMUNITY): Admission: EM | Admit: 2017-03-15 | Discharge: 2017-03-15 | Discharge status: Against Medical Advice | Payer: 59

## 2017-03-15 NOTE — ED Notes (Signed)
[name] called her Doctors office the told her to come in.

## 2017-07-26 ENCOUNTER — Encounter (HOSPITAL_COMMUNITY): Payer: Self-pay | Admitting: Emergency Medicine

## 2017-07-26 ENCOUNTER — Ambulatory Visit (HOSPITAL_COMMUNITY)
Admission: EM | Admit: 2017-07-26 | Discharge: 2017-07-26 | Disposition: A | Discharge status: Home / Self Care | Payer: 59 | Attending: Emergency Medicine | Admitting: Emergency Medicine

## 2017-07-26 DIAGNOSIS — K0889 Other specified disorders of teeth and supporting structures: Secondary | ICD-10-CM

## 2017-07-26 MED ORDER — HYDROCODONE-ACETAMINOPHEN 5-325 MG PO TABS: 1.0000 | ORAL_TABLET | ORAL | 0 refills | Status: DC | PRN

## 2017-07-26 NOTE — ED Provider Notes (Signed)
[facility]    CSN: [id number] Arrival date & time: 07/26/17  [facility]     History   Chief Complaint Chief Complaint  Patient presents with  . Dental Pain    HPI [name] is a 36 y.o. female.   36 year old female had dental extractions last week. These were molars bilaterally in the lower jaw. She has one site to the right lower jaw of the molar with persistent pain unrelieved by OTC medications. She is out of her hydrocodone. She has an appointment with her dentist on Tuesday. Patient is currently taking clindamycin and cephalexin.      Past Medical History:  Diagnosis Date  . Complication of anesthesia    "difficulty waking up and will fight when woken up"  . Gallstones   . Hernia, ventral   . History of blood clots   . History of pulmonary embolus (PE)   . Hypertension   . Hyperthyroidism    01/15/17- in the past  . Shortness of breath dyspnea     Patient Active Problem List   Diagnosis Date Noted  . Left upper quadrant pain 03/03/2017  . Generalized abdominal wall pain 03/03/2017  . Obesity, Class III, BMI 40-49.9 (morbid obesity) ([place]) 03/03/2017  . Small bowel obstruction due to adhesions ([place]) 02/24/2017  . Essential hypertension 02/24/2017  . History of pulmonary embolism 02/24/2017  . Hypothyroidism 02/24/2017  . S/P laparoscopic cholecystectomy 01/18/2017  . Post op infection 05/03/2016  . Incisional hernia 04/13/2016    Past Surgical History:  Procedure Laterality Date  . BREAST SURGERY     cyst removed from right  . CESAREAN SECTION    . CHOLECYSTECTOMY  01/18/2017  . EXPLORATORY LAPAROTOMY N/A 03/05/2017   Performed by Rolm Bookbinder, MD at [place]  . INCISIONAL HERNIA REPAIR  04/13/2016  . INCISIONAL HERNIA REPAIR N/A 04/13/2016   Performed by Coralie Keens, MD at [place]  . INSERTION OF MESH N/A 04/13/2016   Performed by Coralie Keens, MD at [place]  . LAPAROSCOPIC CHOLECYSTECTOMY N/A 01/18/2017   Performed by Coralie Keens, MD at [place] ADHESIONs N/A 03/05/2017   Performed by Rolm Bookbinder, MD at [place]  . PILONIDAL CYST / SINUS EXCISION    . removal of sweat gland Bilateral    arms    OB History    No data available       Home Medications    Prior to Admission medications   Medication Sig Start Date End Date Taking? Authorizing Provider  levothyroxine (SYNTHROID, LEVOTHROID) 125 MCG tablet Take 125 mcg by mouth daily before breakfast.   Yes [provider]  metoprolol (LOPRESSOR) 50 MG tablet Take 50 mg by mouth 2 (two) times daily. 09/12/15  Yes [provider]  Rivaroxaban (XARELTO) 20 MG TABS Take 20 mg by mouth every evening.    Yes [provider]  acetaminophen (TYLENOL) 325 MG tablet Take 2 tablets (650 mg total) by mouth every 6 (six) hours as needed. 03/11/17   Jill Alexanders, PA-C  dicyclomine (BENTYL) 20 MG tablet Take 1 tablet (20 mg total) by mouth 2 (two) times daily. 02/23/17   Long, Wonda Olds, MD  HYDROcodone-acetaminophen (NORCO/VICODIN) 5-325 MG tablet Take 1 tablet every 4 (four) hours as needed by mouth. 07/26/17   Angeliz Settlemyre, Shanon Brow, NP  lisinopril (PRINIVIL,ZESTRIL) 5 MG tablet Take 5 mg by mouth daily. 11/11/15 02/24/17  [provider]  ondansetron (ZOFRAN ODT) 4 MG disintegrating  tablet Take 1 tablet (4 mg total) by mouth every 8 (eight) hours as needed for nausea or vomiting. 03/03/17   Dhungel, Nishant, MD  ondansetron (ZOFRAN) 4 MG tablet Take 1 tablet (4 mg total) by mouth every 8 (eight) hours as needed for nausea or vomiting. 03/12/17   Jill Alexanders, PA-C  oxyCODONE (OXY IR/ROXICODONE) 5 MG immediate release tablet Take 1-2 tablets (5-10 mg total) by mouth every 4 (four) hours as needed (5mg  for moderate pain, 10mg  for severe pain). 03/11/17   Jill Alexanders, PA-C    Family History Family History  Problem Relation Age of Onset  . Hypertension Other   . Hyperlipidemia Other     Social History Social History    Tobacco Use  . Smoking status: Never Smoker  . Smokeless tobacco: Never Used  Substance Use Topics  . Alcohol use: No    Comment: occasionally  . Drug use: No     Allergies   Penicillins   Review of Systems Review of Systems  Constitutional: Negative.   HENT: Positive for dental problem.   All other systems reviewed and are negative.    Physical Exam Triage Vital Signs ED Triage Vitals  Enc Vitals Group     BP 07/26/17 1704 (!) 154/78     Pulse Rate 07/26/17 1704 83     Resp 07/26/17 1704 16     Temp 07/26/17 1704 98.7 F (37.1 C)     Temp Source 07/26/17 1704 Oral     SpO2 07/26/17 1704 98 %     Weight 07/26/17 1704 224 lb (101.6 kg)     Height 07/26/17 1704 5\' 1"  (1.549 m)     Head Circumference --      Peak Flow --      Pain Score 07/26/17 1705 10     Pain Loc --      Pain Edu? --      Excl. in [place]? --    No data found.  Updated Vital Signs BP (!) 154/78   Pulse 83   Temp 98.7 F (37.1 C) (Oral)   Resp 16   Ht 5\' 1"  (1.549 m)   Wt 224 lb (101.6 kg)   SpO2 98%   BMI 42.32 kg/m   Visual Acuity Right Eye Distance:   Left Eye Distance:   Bilateral Distance:    Right Eye Near:   Left Eye Near:    Bilateral Near:     Physical Exam  Constitutional: She is oriented to person, place, and time. She appears well-developed and well-nourished. No distress.  HENT:  Oropharynx is clear. Dental extraction site treating tach. No bleeding. No swelling or erythema.  Neurological: She is alert and oriented to person, place, and time.  Skin: Skin is warm and dry.  Nursing note and vitals reviewed.    UC Treatments / Results  Labs (all labs ordered are listed, but only abnormal results are displayed) Labs Reviewed - No data to display  EKG  EKG Interpretation None       Radiology No results found.  Procedures Procedures (including critical care time)  Medications Ordered in UC Medications - No data to display   Initial Impression /  Assessment and Plan / UC Course  I have reviewed the triage vital signs and the nursing notes.  Pertinent labs & imaging results that were available during my care of the patient were reviewed by me and considered in my medical decision making (see chart for details).  Continue with ibuprofen. Take the Norco as directed and follow-up with your dentist on Tuesday as directed.     Final Clinical Impressions(s) / UC Diagnoses   Final diagnoses:  Pain, dental  Postextraction pain  ED Discharge Orders        Ordered    HYDROcodone-acetaminophen (NORCO/VICODIN) 5-325 MG tablet  Every 4 hours PRN     07/26/17 1727       Controlled Substance Prescriptions [place] Controlled Substance Registry consulted? Not Applicable/no   Janne Napoleon, NP 07/26/17 1742

## 2017-07-26 NOTE — Discharge Instructions (Addendum)
Continue with ibuprofen. Take the Norco as directed and follow-up with your dentist on Tuesday as directed.

## 2017-07-26 NOTE — ED Triage Notes (Signed)
PT had teeth extracted last week. PT believes right lower jaw is infected. The dentist cannot see her until next week.

## 2017-07-26 NOTE — ED Triage Notes (Signed)
PT has been taking antibiotics since teeth were extracted. PT is unsure of name of medication

## 2017-07-26 NOTE — ED Notes (Signed)
D/c by Janne Napoleon

## 2017-07-30 ENCOUNTER — Encounter (HOSPITAL_COMMUNITY): Payer: Self-pay | Admitting: Emergency Medicine

## 2017-07-30 ENCOUNTER — Emergency Department (HOSPITAL_COMMUNITY)
Admission: EM | Admit: 2017-07-30 | Discharge: 2017-07-30 | Disposition: A | Discharge status: Home / Self Care | Payer: 59 | Attending: Emergency Medicine | Admitting: Emergency Medicine

## 2017-07-30 DIAGNOSIS — R102 Pelvic and perineal pain: Secondary | ICD-10-CM | POA: Diagnosis present

## 2017-07-30 DIAGNOSIS — I10 Essential (primary) hypertension: Secondary | ICD-10-CM | POA: Insufficient documentation

## 2017-07-30 DIAGNOSIS — R238 Other skin changes: Secondary | ICD-10-CM

## 2017-07-30 DIAGNOSIS — B373 Candidiasis of vulva and vagina: Secondary | ICD-10-CM | POA: Diagnosis not present

## 2017-07-30 DIAGNOSIS — B3749 Other urogenital candidiasis: Secondary | ICD-10-CM

## 2017-07-30 DIAGNOSIS — E039 Hypothyroidism, unspecified: Secondary | ICD-10-CM | POA: Diagnosis not present

## 2017-07-30 DIAGNOSIS — R21 Rash and other nonspecific skin eruption: Secondary | ICD-10-CM | POA: Insufficient documentation

## 2017-07-30 DIAGNOSIS — Z79899 Other long term (current) drug therapy: Secondary | ICD-10-CM | POA: Insufficient documentation

## 2017-07-30 MED ORDER — LIDOCAINE 5 % EX OINT: 1.0000 "application " | TOPICAL_OINTMENT | CUTANEOUS | 0 refills | Status: DC | PRN

## 2017-07-30 MED ORDER — OXYCODONE-ACETAMINOPHEN 5-325 MG PO TABS: 1.0000 | ORAL_TABLET | Freq: Four times a day (QID) | ORAL | 0 refills | Status: DC | PRN

## 2017-07-30 MED ORDER — FLUCONAZOLE 200 MG PO TABS: 200.0000 mg | ORAL_TABLET | Freq: Every day | ORAL | 0 refills | Status: AC

## 2017-07-30 MED ORDER — OXYCODONE-ACETAMINOPHEN 5-325 MG PO TABS
1.0000 | ORAL_TABLET | Freq: Once | ORAL | Status: AC
  Administered 2017-07-30: 1 via ORAL
  Filled 2017-07-30: qty 1

## 2017-07-30 MED ORDER — LIDOCAINE HCL 2 % EX GEL
1.0000 "application " | Freq: Once | CUTANEOUS | Status: AC
  Administered 2017-07-30: 1 via TOPICAL
  Filled 2017-07-30: qty 11

## 2017-07-30 MED ORDER — VALACYCLOVIR HCL 1 G PO TABS: 1000.0000 mg | ORAL_TABLET | Freq: Three times a day (TID) | ORAL | 0 refills | Status: DC

## 2017-07-30 NOTE — ED Notes (Signed)
ED Provider at bedside. 

## 2017-07-30 NOTE — ED Triage Notes (Signed)
Pt reports she has a sore in her vaginal area and burning with urination.

## 2017-07-30 NOTE — Discharge Instructions (Signed)
At this time before testing return and while the sores are present no unprotected sex.  There is a possibility that this could be genital herpes.  If that is the case then there is no way to get rid of this virus from your body and there is the possibility that right before you were to get an outbreak you could be shedding virus and can give it to your partner without knowing.  However when you are not having an outbreak usually you do not shed the virus and it is less likely to transmit.  Your viral results of testing should be back in 2 days.  You can either start valtrex now or when the results came back.  Use the lidocaine jelly pain meds and yeast medicine.

## 2017-07-30 NOTE — ED Provider Notes (Signed)
[name] DEPT Provider Note   CSN: [id number] Arrival date & time: 07/30/17  5093     History   Chief Complaint No chief complaint on file.   HPI [name] is a 36 y.o. female.  Patient is a 36 year old female with a history of PE, hypertension, hyperthyroidism who is presenting with vaginal pain and discharge.  Patient states approximately 2 weeks ago she developed discharge that was thick and white with discomfort.  She had saw her primary and had STD testing and everything came back normal except for bacterial vaginosis.  Patient states within the last 1 week after testing occurred she has developed a rash that is extremely painful and getting worse.  The pain is now a 10 out of 10 and much worse with any type of palpation.  She has tried putting Neosporin on the area, rubbing alcohol and peroxide none of which have helped.  She also notes a continued discharge but denies any abdominal pain, nausea, vomiting or fever.  Patient is sexually active with only one person who is her husband.  Her husband is asymptomatic at this time.  Patient also notes that she had some dental work done and has been on an antibiotic for 3 weeks but denies a history of diabetes.     The history is provided by the patient.    Past Medical History:  Diagnosis Date  . Complication of anesthesia    "difficulty waking up and will fight when woken up"  . Gallstones   . Hernia, ventral   . History of blood clots   . History of pulmonary embolus (PE)   . Hypertension   . Hyperthyroidism    01/15/17- in the past  . Shortness of breath dyspnea     Patient Active Problem List   Diagnosis Date Noted  . Left upper quadrant pain 03/03/2017  . Generalized abdominal wall pain 03/03/2017  . Obesity, Class III, BMI 40-49.9 (morbid obesity) ([place]) 03/03/2017  . Small bowel obstruction due to adhesions ([place]) 02/24/2017  . Essential hypertension 02/24/2017  . History of  pulmonary embolism 02/24/2017  . Hypothyroidism 02/24/2017  . S/P laparoscopic cholecystectomy 01/18/2017  . Post op infection 05/03/2016  . Incisional hernia 04/13/2016    Past Surgical History:  Procedure Laterality Date  . BREAST SURGERY     cyst removed from right  . CESAREAN SECTION    . CHOLECYSTECTOMY  01/18/2017  . CHOLECYSTECTOMY N/A 01/18/2017   Procedure: LAPAROSCOPIC CHOLECYSTECTOMY;  Surgeon: Coralie Keens, MD;  Location: [facility];  Service: General;  Laterality: N/A;  . INCISIONAL HERNIA REPAIR  04/13/2016  . INCISIONAL HERNIA REPAIR N/A 04/13/2016   Procedure: INCISIONAL HERNIA REPAIR;  Surgeon: Coralie Keens, MD;  Location: [facility];  Service: General;  Laterality: N/A;  . INSERTION OF MESH N/A 04/13/2016   Procedure: INSERTION OF MESH;  Surgeon: Coralie Keens, MD;  Location: [facility];  Service: General;  Laterality: N/A;  . LAPAROTOMY N/A 03/05/2017   Procedure: EXPLORATORY LAPAROTOMY;  Surgeon: Rolm Bookbinder, MD;  Location: [facility];  Service: General;  Laterality: N/A;  . LYSIS OF ADHESION N/A 03/05/2017   Procedure: LYSIS OF ADHESIONs;  Surgeon: Rolm Bookbinder, MD;  Location: [facility];  Service: General;  Laterality: N/A;  . PILONIDAL CYST / SINUS EXCISION    . removal of sweat gland Bilateral    arms    OB History    No data available       Home Medications  Prior to Admission medications   Medication Sig Start Date End Date Taking? Authorizing Provider  acetaminophen (TYLENOL) 325 MG tablet Take 2 tablets (650 mg total) by mouth every 6 (six) hours as needed. 03/11/17   Jill Alexanders, PA-C  dicyclomine (BENTYL) 20 MG tablet Take 1 tablet (20 mg total) by mouth 2 (two) times daily. 02/23/17   Long, Wonda Olds, MD  HYDROcodone-acetaminophen (NORCO/VICODIN) 5-325 MG tablet Take 1 tablet every 4 (four) hours as needed by mouth. 07/26/17   Mabe, Shanon Brow, NP  levothyroxine (SYNTHROID, LEVOTHROID) 125 MCG tablet Take 125 mcg by mouth daily before breakfast.     [provider]  lisinopril (PRINIVIL,ZESTRIL) 5 MG tablet Take 5 mg by mouth daily. 11/11/15 02/24/17  [provider]  metoprolol (LOPRESSOR) 50 MG tablet Take 50 mg by mouth 2 (two) times daily. 09/12/15   [provider]  ondansetron (ZOFRAN ODT) 4 MG disintegrating tablet Take 1 tablet (4 mg total) by mouth every 8 (eight) hours as needed for nausea or vomiting. 03/03/17   Dhungel, Nishant, MD  ondansetron (ZOFRAN) 4 MG tablet Take 1 tablet (4 mg total) by mouth every 8 (eight) hours as needed for nausea or vomiting. 03/12/17   Jill Alexanders, PA-C  oxyCODONE (OXY IR/ROXICODONE) 5 MG immediate release tablet Take 1-2 tablets (5-10 mg total) by mouth every 4 (four) hours as needed (5mg  for moderate pain, 10mg  for severe pain). 03/11/17   Jill Alexanders, PA-C  Rivaroxaban (XARELTO) 20 MG TABS Take 20 mg by mouth every evening.     [provider]    Family History Family History  Problem Relation Age of Onset  . Hypertension Other   . Hyperlipidemia Other     Social History Social History   Tobacco Use  . Smoking status: Never Smoker  . Smokeless tobacco: Never Used  Substance Use Topics  . Alcohol use: No    Comment: occasionally  . Drug use: No     Allergies   Penicillins   Review of Systems Review of Systems  All other systems reviewed and are negative.    Physical Exam Updated Vital Signs BP (!) 148/90 (BP Location: Left Arm)   Pulse 80   Temp 98 F (36.7 C) (Oral)   Resp 16   Ht 5\' 1"  (1.549 m)   Wt 99.8 kg (220 lb)   SpO2 100%   BMI 41.57 kg/m   Physical Exam  Constitutional: She is oriented to person, place, and time. She appears well-developed and well-nourished. No distress.  HENT:  Head: Normocephalic and atraumatic.  Cardiovascular: Normal rate.  Pulmonary/Chest: Effort normal.  Abdominal: Soft.  Genitourinary:    Vaginal discharge found.  Genitourinary Comments: Thick white curd-like discharge that fills  the vaginal vault and coats the vaginal wall  Neurological: She is alert and oriented to person, place, and time.  Skin: Skin is warm and dry.  Psychiatric: She has a normal mood and affect. Her behavior is normal.  Nursing note and vitals reviewed.    ED Treatments / Results  Labs (all labs ordered are listed, but only abnormal results are displayed) Labs Reviewed - No data to display  EKG  EKG Interpretation None       Radiology No results found.  Procedures Procedures (including critical care time)  Medications Ordered in ED Medications - No data to display   Initial Impression / Assessment and Plan / ED Course  I have reviewed the triage vital signs and the  nursing notes.  Pertinent labs & imaging results that were available during my care of the patient were reviewed by me and considered in my medical decision making (see chart for details).    Patient presenting with persistent vaginal discharge over the last 2 weeks and a painful rash that has been present for the last 1 week.  Patient does have areas that look like vesicular lesions externally but macerated open wounds in the vaginal vault.  Patient was just tested last week for STDs such as gonorrhea and chlamydia and were negative but did test positive for bacterial vaginosis.  She is also been on an antibiotic for dental infection for the last 3 weeks which would make her much more likely to have a yeast infection.  Repeat GC and chlamydia were not done due to the patient just having the testing.  However herpes swab was done and patient was treated preemptively with Valtrex for concern for herpes.  Patient was also given pain medication and lidocaine jelly.  She was also treated for yeast with Diflucan. Final Clinical Impressions(s) / ED Diagnoses   Final diagnoses:  Candida infection of genital region  Rash, vesicular    ED Discharge Orders        Ordered    oxyCODONE-acetaminophen (PERCOCET/ROXICET) 5-325  MG tablet  Every 6 hours PRN     07/30/17 1040    fluconazole (DIFLUCAN) 200 MG tablet  Daily     07/30/17 1040    valACYclovir (VALTREX) 1000 MG tablet  3 times daily     07/30/17 1040    lidocaine (XYLOCAINE) 5 % ointment  As needed     07/30/17 1040       Blanchie Dessert, MD 07/30/17 1040

## 2017-08-01 LAB — HSV CULTURE AND TYPING

## 2017-08-28 ENCOUNTER — Emergency Department (HOSPITAL_COMMUNITY)
Admission: EM | Admit: 2017-08-28 | Discharge: 2017-08-28 | Disposition: A | Discharge status: Home / Self Care | Payer: 59 | Attending: Emergency Medicine | Admitting: Emergency Medicine

## 2017-08-28 ENCOUNTER — Emergency Department (HOSPITAL_COMMUNITY): Payer: 59

## 2017-08-28 ENCOUNTER — Encounter (HOSPITAL_COMMUNITY): Payer: Self-pay | Admitting: Emergency Medicine

## 2017-08-28 ENCOUNTER — Other Ambulatory Visit: Payer: Self-pay

## 2017-08-28 DIAGNOSIS — R1084 Generalized abdominal pain: Secondary | ICD-10-CM | POA: Insufficient documentation

## 2017-08-28 DIAGNOSIS — Z86718 Personal history of other venous thrombosis and embolism: Secondary | ICD-10-CM | POA: Diagnosis not present

## 2017-08-28 DIAGNOSIS — R220 Localized swelling, mass and lump, head: Secondary | ICD-10-CM

## 2017-08-28 DIAGNOSIS — I10 Essential (primary) hypertension: Secondary | ICD-10-CM | POA: Diagnosis not present

## 2017-08-28 DIAGNOSIS — Z79899 Other long term (current) drug therapy: Secondary | ICD-10-CM | POA: Diagnosis not present

## 2017-08-28 DIAGNOSIS — R600 Localized edema: Secondary | ICD-10-CM | POA: Diagnosis present

## 2017-08-28 LAB — CBC
HCT: 38 % (ref 36.0–46.0)
HEMOGLOBIN: 13 g/dL (ref 12.0–15.0)
MCH: 33 pg (ref 26.0–34.0)
MCHC: 34.2 g/dL (ref 30.0–36.0)
MCV: 96.4 fL (ref 78.0–100.0)
Platelets: 377 10*3/uL (ref 150–400)
RBC: 3.94 MIL/uL (ref 3.87–5.11)
RDW: 12.6 % (ref 11.5–15.5)
WBC: 6.7 10*3/uL (ref 4.0–10.5)

## 2017-08-28 LAB — URINALYSIS, ROUTINE W REFLEX MICROSCOPIC
Bilirubin Urine: NEGATIVE
Glucose, UA: NEGATIVE mg/dL
HGB URINE DIPSTICK: NEGATIVE
Ketones, ur: NEGATIVE mg/dL
LEUKOCYTES UA: NEGATIVE
Nitrite: NEGATIVE
Protein, ur: NEGATIVE mg/dL
SPECIFIC GRAVITY, URINE: 1.016 (ref 1.005–1.030)
pH: 5 (ref 5.0–8.0)

## 2017-08-28 LAB — I-STAT BETA HCG BLOOD, ED (MC, WL, AP ONLY)

## 2017-08-28 LAB — COMPREHENSIVE METABOLIC PANEL
ALT: 16 U/L (ref 14–54)
ANION GAP: 9 (ref 5–15)
AST: 23 U/L (ref 15–41)
Albumin: 3.8 g/dL (ref 3.5–5.0)
Alkaline Phosphatase: 82 U/L (ref 38–126)
BUN: 5 mg/dL — ABNORMAL LOW (ref 6–20)
CALCIUM: 8.6 mg/dL — AB (ref 8.9–10.3)
CHLORIDE: 106 mmol/L (ref 101–111)
CO2: 22 mmol/L (ref 22–32)
Creatinine, Ser: 0.8 mg/dL (ref 0.44–1.00)
GFR calc non Af Amer: >60 mL/min (ref >60)
Glucose, Bld: 95 mg/dL (ref 65–99)
Potassium: 3.6 mmol/L (ref 3.5–5.1)
SODIUM: 137 mmol/L (ref 135–145)
Total Bilirubin: 0.7 mg/dL (ref 0.3–1.2)
Total Protein: 7.4 g/dL (ref 6.5–8.1)

## 2017-08-28 LAB — LIPASE, BLOOD: LIPASE: 19 U/L (ref 11–51)

## 2017-08-28 MED ORDER — IOPAMIDOL (ISOVUE-300) INJECTION 61%
INTRAVENOUS | Status: AC
  Administered 2017-08-28: 75 mL
  Filled 2017-08-28: qty 75

## 2017-08-28 NOTE — ED Triage Notes (Signed)
Pt. Stated, I've had facial swelling and stomach swelling since November. Pt. Has not seen anyone

## 2017-08-28 NOTE — ED Provider Notes (Signed)
[facility] EMERGENCY DEPARTMENT Provider Note   CSN: [id number] Arrival date & time: 08/28/17  8563     History   Chief Complaint Chief Complaint  Patient presents with  . Facial Swelling  . Abdominal Pain    swelling    HPI JAMYE BALICKI is a 36 y.o. female.  The history is provided by the patient. No language interpreter was used.  Abdominal Pain      MASIE BERMINGHAM is a 36 y.o. female who presents to the Emergency Department complaining of facial swelling, abdominal pain.  She complains of facial and abdominal swelling that is been ongoing since November.  In terms of the abdominal swelling she notes some intermittent abdominal pain described as generalized in nature as well as abdominal fullness.  Her pain is constant.  She has occasional vomiting with meals, about twice a week.  Bowel movements are normal.  Terms of her facial swelling this is located in her lower face up to her cheeks.  It has been gradually progressive and significantly noticeable over the last few days.  No dental pain, sore throat, chest pain, difficulty breathing, leg swelling or pain.  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  No weight loss or night sweats.  Past Medical History:  Diagnosis Date  . Complication of anesthesia    "difficulty waking up and will fight when woken up"  . Gallstones   . Hernia, ventral   . History of blood clots   . History of pulmonary embolus (PE)   . Hypertension   . Hyperthyroidism    01/15/17- in the past  . Shortness of breath dyspnea     Patient Active Problem List   Diagnosis Date Noted  . Left upper quadrant pain 03/03/2017  . Generalized abdominal wall pain 03/03/2017  . Obesity, Class III, BMI 40-49.9 (morbid obesity) ([place]) 03/03/2017  . Small bowel obstruction due to adhesions ([place]) 02/24/2017  . Essential hypertension 02/24/2017  . History of pulmonary embolism 02/24/2017  . Hypothyroidism 02/24/2017  .  S/P laparoscopic cholecystectomy 01/18/2017  . Post op infection 05/03/2016  . Incisional hernia 04/13/2016    Past Surgical History:  Procedure Laterality Date  . BREAST SURGERY     cyst removed from right  . CESAREAN SECTION    . CHOLECYSTECTOMY  01/18/2017  . CHOLECYSTECTOMY N/A 01/18/2017   Procedure: LAPAROSCOPIC CHOLECYSTECTOMY;  Surgeon: Coralie Keens, MD;  Location: [facility];  Service: General;  Laterality: N/A;  . INCISIONAL HERNIA REPAIR  04/13/2016  . INCISIONAL HERNIA REPAIR N/A 04/13/2016   Procedure: INCISIONAL HERNIA REPAIR;  Surgeon: Coralie Keens, MD;  Location: [facility];  Service: General;  Laterality: N/A;  . INSERTION OF MESH N/A 04/13/2016   Procedure: INSERTION OF MESH;  Surgeon: Coralie Keens, MD;  Location: [facility];  Service: General;  Laterality: N/A;  . LAPAROTOMY N/A 03/05/2017   Procedure: EXPLORATORY LAPAROTOMY;  Surgeon: Rolm Bookbinder, MD;  Location: [facility];  Service: General;  Laterality: N/A;  . LYSIS OF ADHESION N/A 03/05/2017   Procedure: LYSIS OF ADHESIONs;  Surgeon: Rolm Bookbinder, MD;  Location: [facility];  Service: General;  Laterality: N/A;  . PILONIDAL CYST / SINUS EXCISION    . removal of sweat gland Bilateral    arms    OB History    No data available       Home Medications    Prior to Admission medications   Medication Sig Start Date End Date  Taking? Authorizing Provider  levothyroxine (SYNTHROID, LEVOTHROID) 112 MCG tablet Take 112 mcg by mouth daily. 07/15/17  Yes [provider]  lisinopril (PRINIVIL,ZESTRIL) 5 MG tablet Take 5 mg by mouth daily. 11/11/15  Yes [provider]  metoprolol (LOPRESSOR) 50 MG tablet Take 50 mg by mouth 2 (two) times daily. 09/12/15  Yes [provider]  Rivaroxaban (XARELTO) 20 MG TABS Take 20 mg by mouth every evening.    Yes [provider]  tiZANidine (ZANAFLEX) 4 MG tablet Take 4 mg by mouth at bedtime as needed for spasms. 07/30/17  Yes [provider]    traZODone (DESYREL) 50 MG tablet Take 50 mg by mouth at bedtime. 06/24/17  Yes [provider]  lidocaine (XYLOCAINE) 5 % ointment Apply 1 application topically as needed. Patient not taking: Reported on 08/28/2017 07/30/17   Blanchie Dessert, MD  lisinopril (PRINIVIL,ZESTRIL) 5 MG tablet Take 5 mg by mouth daily. 11/11/15 07/30/17  [provider]  oxyCODONE-acetaminophen (PERCOCET/ROXICET) 5-325 MG tablet Take 1-2 tablets by mouth every 6 (six) hours as needed for severe pain. Patient not taking: Reported on 08/28/2017 07/30/17   Blanchie Dessert, MD  valACYclovir (VALTREX) 1000 MG tablet Take 1 tablet (1,000 mg total) by mouth 3 (three) times daily. Patient not taking: Reported on 08/28/2017 07/30/17   Blanchie Dessert, MD    Family History Family History  Problem Relation Age of Onset  . Hypertension Other   . Hyperlipidemia Other     Social History Social History   Tobacco Use  . Smoking status: Never Smoker  . Smokeless tobacco: Never Used  Substance Use Topics  . Alcohol use: No    Comment: occasionally  . Drug use: No     Allergies   Penicillins   Review of Systems Review of Systems  Gastrointestinal: Positive for abdominal pain.  All other systems reviewed and are negative.    Physical Exam Updated Vital Signs BP (!) 142/97   Pulse 69   Temp 98.6 F (37 C) (Oral)   Resp 16   Ht 5\' 1"  (1.549 m)   Wt 99.8 kg (220 lb)   SpO2 100%   BMI 41.57 kg/m   Physical Exam  Constitutional: She is oriented to person, place, and time. She appears well-developed and well-nourished.  HENT:  Head: Normocephalic and atraumatic.  Fullness of the lower face and submandibular region.  There is no oropharyngeal edema or erythema.  Neck: Neck supple.  Fullness of the anterior neck, right greater than left.  Cardiovascular: Normal rate and regular rhythm.  No murmur heard. Pulmonary/Chest: Effort normal and breath sounds normal. No respiratory  distress.  Abdominal: Soft. There is no tenderness. There is no rebound and no guarding.  Musculoskeletal: She exhibits no edema or tenderness.  Neurological: She is alert and oriented to person, place, and time.  Skin: Skin is warm and dry.  Psychiatric: She has a normal mood and affect. Her behavior is normal.  Nursing note and vitals reviewed.    ED Treatments / Results  Labs (all labs ordered are listed, but only abnormal results are displayed) Labs Reviewed  COMPREHENSIVE METABOLIC PANEL - Abnormal; Notable for the following components:      Result Value   BUN 5 (*)    Calcium 8.6 (*)    All other components within normal limits  LIPASE, BLOOD  CBC  URINALYSIS, ROUTINE W REFLEX MICROSCOPIC  I-STAT BETA HCG BLOOD, ED ([facility], [facility], [facility] ONLY)    EKG  EKG  Interpretation None       Radiology Dg Chest 2 View  Result Date: 08/28/2017 CLINICAL DATA:  Facial swelling. EXAM: CHEST  2 VIEW COMPARISON:  Radiograph of March 05, 2017. FINDINGS: The heart size and mediastinal contours are within normal limits. Both lungs are clear. No pneumothorax or pleural effusion is noted. The visualized skeletal structures are unremarkable. IMPRESSION: No active cardiopulmonary disease. Electronically Signed   By: Marijo Conception, M.D.   On: 08/28/2017 11:58   Ct Soft Tissue Neck W Contrast  Result Date: 08/28/2017 CLINICAL DATA:  Facial and neck swelling with pain since November. Nausea and vomiting today. EXAM: CT NECK WITH CONTRAST TECHNIQUE: Multidetector CT imaging of the neck was performed using the standard protocol following the bolus administration of intravenous contrast. CONTRAST:  61mL ISOVUE-300 IOPAMIDOL (ISOVUE-300) INJECTION 61% COMPARISON:  None. FINDINGS: Pharynx and larynx: Symmetric thickened palatine tonsils touching the uvula, without inflammatory enhancement or fat edema. No submucosal edema or mass noted. Salivary glands: No inflammation, mass, or stone. Thyroid: Normal. Lymph  nodes: None enlarged or abnormal density. Vascular: Negative. Limited intracranial: Negative. Visualized orbits: Negative Mastoids and visualized paranasal sinuses: Clear Skeleton: No acute or aggressive finding Upper chest: Negative Other: Small oil cyst or fat necrosis seen in the right breast. IMPRESSION: 1. No visible inflammation in the neck. 2. Symmetric tonsillar hypertrophy. Electronically Signed   By: Monte Fantasia M.D.   On: 08/28/2017 13:23    Procedures Procedures (including critical care time)  Medications Ordered in ED Medications  iopamidol (ISOVUE-300) 61 % injection (75 mLs  Contrast Given 08/28/17 1253)     Initial Impression / Assessment and Plan / ED Course  I have reviewed the triage vital signs and the nursing notes.  Pertinent labs & imaging results that were available during my care of the patient were reviewed by me and considered in my medical decision making (see chart for details).    Patient here for evaluation of abdominal pain as well as facial swelling this been ongoing since November.  In terms of her abdominal pain abdomen is soft with no significant tenderness.  There are no historical or examination features concerning for bowel obstruction or acute abdomen.  In terms of her facial swelling she does appear to have some fullness of her lower face and neck.  CT scan obtained and was negative for acute mass or obstructive lesion.  Counseled patient on unclear etiology of her symptoms.  Discussed outpatient follow-up and return precautions. Final Clinical Impressions(s) / ED Diagnoses   Final diagnoses:  Facial swelling  Generalized abdominal pain    ED Discharge Orders    None       Quintella Reichert, MD 08/28/17 1702

## 2017-09-20 ENCOUNTER — Other Ambulatory Visit: Payer: Self-pay | Admitting: Nurse Practitioner

## 2017-09-20 DIAGNOSIS — R1084 Generalized abdominal pain: Secondary | ICD-10-CM

## 2017-09-20 DIAGNOSIS — Z8719 Personal history of other diseases of the digestive system: Secondary | ICD-10-CM

## 2017-09-20 DIAGNOSIS — R110 Nausea: Secondary | ICD-10-CM

## 2017-09-23 ENCOUNTER — Other Ambulatory Visit: Payer: 59

## 2017-09-23 ENCOUNTER — Ambulatory Visit
Admission: RE | Admit: 2017-09-23 | Discharge: 2017-09-23 | Disposition: A | Discharge status: Home / Self Care | Payer: 59 | Source: Ambulatory Visit | Attending: Nurse Practitioner | Admitting: Nurse Practitioner

## 2017-09-23 DIAGNOSIS — Z8719 Personal history of other diseases of the digestive system: Secondary | ICD-10-CM

## 2017-09-23 DIAGNOSIS — R1084 Generalized abdominal pain: Secondary | ICD-10-CM

## 2017-09-23 DIAGNOSIS — R110 Nausea: Secondary | ICD-10-CM

## 2017-09-23 MED ORDER — IOPAMIDOL (ISOVUE-300) INJECTION 61%
100.0000 mL | Freq: Once | INTRAVENOUS | Status: AC | PRN
Start: 2017-09-23 — End: 2017-09-23
  Administered 2017-09-23: 100 mL via INTRAVENOUS

## 2017-12-09 ENCOUNTER — Other Ambulatory Visit: Payer: Self-pay

## 2017-12-09 ENCOUNTER — Encounter (HOSPITAL_COMMUNITY): Payer: Self-pay

## 2017-12-09 ENCOUNTER — Emergency Department (HOSPITAL_COMMUNITY)
Admission: EM | Admit: 2017-12-09 | Discharge: 2017-12-09 | Disposition: A | Discharge status: Home / Self Care | Payer: 59 | Attending: Emergency Medicine | Admitting: Emergency Medicine

## 2017-12-09 DIAGNOSIS — M791 Myalgia, unspecified site: Secondary | ICD-10-CM | POA: Insufficient documentation

## 2017-12-09 DIAGNOSIS — Z7901 Long term (current) use of anticoagulants: Secondary | ICD-10-CM | POA: Diagnosis not present

## 2017-12-09 DIAGNOSIS — R5383 Other fatigue: Secondary | ICD-10-CM | POA: Diagnosis not present

## 2017-12-09 DIAGNOSIS — T452X1A Poisoning by vitamins, accidental (unintentional), initial encounter: Secondary | ICD-10-CM | POA: Diagnosis not present

## 2017-12-09 DIAGNOSIS — I10 Essential (primary) hypertension: Secondary | ICD-10-CM | POA: Insufficient documentation

## 2017-12-09 DIAGNOSIS — Z79899 Other long term (current) drug therapy: Secondary | ICD-10-CM | POA: Diagnosis not present

## 2017-12-09 DIAGNOSIS — R52 Pain, unspecified: Secondary | ICD-10-CM

## 2017-12-09 LAB — BASIC METABOLIC PANEL
ANION GAP: 12 (ref 5–15)
BUN: 11 mg/dL (ref 6–20)
CO2: 22 mmol/L (ref 22–32)
Calcium: 9.2 mg/dL (ref 8.9–10.3)
Chloride: 103 mmol/L (ref 101–111)
Creatinine, Ser: 0.88 mg/dL (ref 0.44–1.00)
GLUCOSE: 133 mg/dL — AB (ref 65–99)
POTASSIUM: 4 mmol/L (ref 3.5–5.1)
Sodium: 137 mmol/L (ref 135–145)

## 2017-12-09 LAB — CBC
HCT: 39.6 % (ref 36.0–46.0)
HEMOGLOBIN: 13.5 g/dL (ref 12.0–15.0)
MCH: 32.4 pg (ref 26.0–34.0)
MCHC: 34.1 g/dL (ref 30.0–36.0)
MCV: 95 fL (ref 78.0–100.0)
Platelets: 385 10*3/uL (ref 150–400)
RBC: 4.17 MIL/uL (ref 3.87–5.11)
RDW: 12.5 % (ref 11.5–15.5)
WBC: 6.9 10*3/uL (ref 4.0–10.5)

## 2017-12-09 LAB — I-STAT BETA HCG BLOOD, ED (MC, WL, AP ONLY): I-stat hCG, quantitative: <5 m[IU]/mL (ref <5)

## 2017-12-09 NOTE — Discharge Instructions (Signed)
Please read attached information. If you experience any new or worsening signs or symptoms please return to the emergency room for evaluation. Please follow-up with your primary care provider or specialist as discussed.  °

## 2017-12-09 NOTE — ED Provider Notes (Signed)
[facility] EMERGENCY DEPARTMENT Provider Note   CSN: [id number] Arrival date & time: 12/09/17  1136     History   Chief Complaint Chief Complaint  Patient presents with  . Fatigue  . took too many vitamins    HPI [name] is a 37 y.o. female.  HPI   37 year old female presents today with complaints of vitamin D overdose.  Patient notes she was prescribed vitamin D for low levels.  She notes that the prescription was written for once weekly, but notes that she was taking it daily.  She notes she finished this a couple weeks ago contacted her primary care provider and was questioning why she did not have a refill at the pharmacy.  She notes she spoke with her today as she was having body aches given the fact that she had been taking excessive amounts of vitamin D she was encouraged to come to the emergency room.  Patient notes she has had repeat vitamin D levels after taking completed course and they were still low.  Patient reports that these body aches have been present even before her diagnosis of deficient levels of vitamin D.    Past Medical History:  Diagnosis Date  . Complication of anesthesia    "difficulty waking up and will fight when woken up"  . Gallstones   . Hernia, ventral   . History of blood clots   . History of pulmonary embolus (PE)   . Hypertension   . Hyperthyroidism    01/15/17- in the past  . Shortness of breath dyspnea     Patient Active Problem List   Diagnosis Date Noted  . Left upper quadrant pain 03/03/2017  . Generalized abdominal wall pain 03/03/2017  . Obesity, Class III, BMI 40-49.9 (morbid obesity) ([place]) 03/03/2017  . Small bowel obstruction due to adhesions ([place]) 02/24/2017  . Essential hypertension 02/24/2017  . History of pulmonary embolism 02/24/2017  . Hypothyroidism 02/24/2017  . S/P laparoscopic cholecystectomy 01/18/2017  . Post op infection 05/03/2016  . Incisional hernia 04/13/2016    Past Surgical  History:  Procedure Laterality Date  . BREAST SURGERY     cyst removed from right  . CESAREAN SECTION    . CHOLECYSTECTOMY  01/18/2017  . CHOLECYSTECTOMY N/A 01/18/2017   Procedure: LAPAROSCOPIC CHOLECYSTECTOMY;  Surgeon: Coralie Keens, MD;  Location: [facility];  Service: General;  Laterality: N/A;  . INCISIONAL HERNIA REPAIR  04/13/2016  . INCISIONAL HERNIA REPAIR N/A 04/13/2016   Procedure: INCISIONAL HERNIA REPAIR;  Surgeon: Coralie Keens, MD;  Location: [facility];  Service: General;  Laterality: N/A;  . INSERTION OF MESH N/A 04/13/2016   Procedure: INSERTION OF MESH;  Surgeon: Coralie Keens, MD;  Location: [facility];  Service: General;  Laterality: N/A;  . LAPAROTOMY N/A 03/05/2017   Procedure: EXPLORATORY LAPAROTOMY;  Surgeon: Rolm Bookbinder, MD;  Location: [facility];  Service: General;  Laterality: N/A;  . LYSIS OF ADHESION N/A 03/05/2017   Procedure: LYSIS OF ADHESIONs;  Surgeon: Rolm Bookbinder, MD;  Location: [facility];  Service: General;  Laterality: N/A;  . PILONIDAL CYST / SINUS EXCISION    . removal of sweat gland Bilateral    arms     OB History   None      Home Medications    Prior to Admission medications   Medication Sig Start Date End Date Taking? Authorizing Provider  levothyroxine (SYNTHROID, LEVOTHROID) 112 MCG tablet Take 112 mcg by mouth daily. 07/15/17   [provider]  lidocaine (XYLOCAINE) 5 % ointment Apply 1 application topically as needed. Patient not taking: Reported on 08/28/2017 07/30/17   Blanchie Dessert, MD  lisinopril (PRINIVIL,ZESTRIL) 5 MG tablet Take 5 mg by mouth daily. 11/11/15 07/30/17  [provider]  lisinopril (PRINIVIL,ZESTRIL) 5 MG tablet Take 5 mg by mouth daily. 11/11/15   [provider]  metoprolol (LOPRESSOR) 50 MG tablet Take 50 mg by mouth 2 (two) times daily. 09/12/15   [provider]  oxyCODONE-acetaminophen (PERCOCET/ROXICET) 5-325 MG tablet Take 1-2 tablets by mouth every 6 (six) hours as needed for  severe pain. Patient not taking: Reported on 08/28/2017 07/30/17   Blanchie Dessert, MD  Rivaroxaban (XARELTO) 20 MG TABS Take 20 mg by mouth every evening.     [provider]  tiZANidine (ZANAFLEX) 4 MG tablet Take 4 mg by mouth at bedtime as needed for spasms. 07/30/17   [provider]  traZODone (DESYREL) 50 MG tablet Take 50 mg by mouth at bedtime. 06/24/17   [provider]  valACYclovir (VALTREX) 1000 MG tablet Take 1 tablet (1,000 mg total) by mouth 3 (three) times daily. Patient not taking: Reported on 08/28/2017 07/30/17   Blanchie Dessert, MD    Family History Family History  Problem Relation Age of Onset  . Hypertension Other   . Hyperlipidemia Other     Social History Social History   Tobacco Use  . Smoking status: Never Smoker  . Smokeless tobacco: Never Used  Substance Use Topics  . Alcohol use: No    Comment: occasionally  . Drug use: No     Allergies   Penicillins   Review of Systems Review of Systems  All other systems reviewed and are negative.  Physical Exam Updated Vital Signs BP 119/85 (BP Location: Right Arm)   Pulse 87   Temp 98.6 F (37 C) (Oral)   Resp 16   LMP 11/08/2017 (Approximate)   SpO2 100%   Physical Exam  Constitutional: She is oriented to person, place, and time. She appears well-developed and well-nourished.  HENT:  Head: Normocephalic and atraumatic.  Eyes: Pupils are equal, round, and reactive to light. Conjunctivae are normal. Right eye exhibits no discharge. Left eye exhibits no discharge. No scleral icterus.  Neck: Normal range of motion. No JVD present. No tracheal deviation present.  Cardiovascular: Normal rate, regular rhythm, normal heart sounds and intact distal pulses. Exam reveals no gallop and no friction rub.  No murmur heard. Pulmonary/Chest: Effort normal and breath sounds normal. No stridor. No respiratory distress. She has no wheezes. She has no rales. She exhibits no  tenderness.  Neurological: She is alert and oriented to person, place, and time. Coordination normal.  Skin: Skin is warm.  Psychiatric: She has a normal mood and affect. Her behavior is normal. Judgment and thought content normal.  Nursing note and vitals reviewed.    ED Treatments / Results  Labs (all labs ordered are listed, but only abnormal results are displayed) Labs Reviewed  BASIC METABOLIC PANEL - Abnormal; Notable for the following components:      Result Value   Glucose, Bld 133 (*)    All other components within normal limits  CBC  I-STAT BETA HCG BLOOD, ED ([facility], [facility], [facility] ONLY)    EKG None  Radiology No results found.  Procedures Procedures (including critical care time)  Medications Ordered in ED Medications - No data to display   Initial Impression / Assessment and Plan / ED Course  I have  reviewed the triage vital signs and the nursing notes.  Pertinent labs & imaging results that were available during my care of the patient were reviewed by me and considered in my medical decision making (see chart for details).      Final Clinical Impressions(s) / ED Diagnoses   Final diagnoses:  Body aches  Vitamin D overdose, accidental or unintentional, initial encounter    Labs: I stat beta hcg, BMP, CBC  Imaging:  Consults:  Therapeutics:  Discharge Meds:   Assessment/Plan: 37 year old female presents today with complaints of vitamin D overdose.  Patient has had repeat labs with low vitamin D levels, this is a water-soluble vitamin I have low suspicion for toxicity.  Patient has no changes in her calcium, the rest of her laboratory analysis is reassuring with the exception of her slightly elevated glucose.  Patient's symptoms have been present prior to use of vitamin D.  She has no infectious etiology, no neurological deficits she will be referred to primary care for ongoing evaluation management return precautions given.   ED Discharge Orders    None         Okey Regal, PA-C 12/09/17 [place], MD 12/09/17 2257

## 2017-12-09 NOTE — ED Triage Notes (Signed)
Pt presents for evaluation of fatigue/generalized weakness. States was recently prescribed Vit D supplement, instead of taking weekly she was accidentally taking daily. Pt PCP suggested she come be evaluated. Denies SI/HI

## 2017-12-26 ENCOUNTER — Other Ambulatory Visit: Payer: Self-pay

## 2017-12-26 ENCOUNTER — Emergency Department (HOSPITAL_COMMUNITY)
Admission: EM | Admit: 2017-12-26 | Discharge: 2017-12-26 | Disposition: A | Discharge status: Home / Self Care | Payer: 59 | Attending: Emergency Medicine | Admitting: Emergency Medicine

## 2017-12-26 ENCOUNTER — Encounter (HOSPITAL_COMMUNITY): Payer: Self-pay

## 2017-12-26 DIAGNOSIS — E039 Hypothyroidism, unspecified: Secondary | ICD-10-CM | POA: Insufficient documentation

## 2017-12-26 DIAGNOSIS — Z79899 Other long term (current) drug therapy: Secondary | ICD-10-CM | POA: Diagnosis not present

## 2017-12-26 DIAGNOSIS — S161XXA Strain of muscle, fascia and tendon at neck level, initial encounter: Secondary | ICD-10-CM | POA: Diagnosis not present

## 2017-12-26 DIAGNOSIS — S199XXA Unspecified injury of neck, initial encounter: Secondary | ICD-10-CM | POA: Diagnosis present

## 2017-12-26 DIAGNOSIS — Y92410 Unspecified street and highway as the place of occurrence of the external cause: Secondary | ICD-10-CM | POA: Diagnosis not present

## 2017-12-26 DIAGNOSIS — I10 Essential (primary) hypertension: Secondary | ICD-10-CM | POA: Diagnosis not present

## 2017-12-26 DIAGNOSIS — Y939 Activity, unspecified: Secondary | ICD-10-CM | POA: Diagnosis not present

## 2017-12-26 DIAGNOSIS — Y999 Unspecified external cause status: Secondary | ICD-10-CM | POA: Insufficient documentation

## 2017-12-26 MED ORDER — ACETAMINOPHEN 325 MG PO TABS: 650.0000 mg | ORAL_TABLET | Freq: Four times a day (QID) | ORAL | 0 refills | Status: DC | PRN
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
## 2017-12-26 NOTE — Discharge Instructions (Addendum)
You were given a prescription for Tylenol which you can take every 6 hours for your pain.  You can also use warm and cold compresses to help with your symptoms.  Please follow-up with your primary care doctor in 1 week for reevaluation.  Return to the ER for any new or worsening symptoms including any numbness in the arms or weakness in the arms, chest pain, shortness of breath, headaches.

## 2018-02-11 ENCOUNTER — Encounter (HOSPITAL_COMMUNITY): Payer: Self-pay

## 2018-02-11 ENCOUNTER — Other Ambulatory Visit: Payer: Self-pay

## 2018-02-11 ENCOUNTER — Emergency Department (HOSPITAL_COMMUNITY): Payer: 59

## 2018-02-11 ENCOUNTER — Emergency Department (HOSPITAL_COMMUNITY)
Admission: EM | Admit: 2018-02-11 | Discharge: 2018-02-11 | Discharge status: Against Medical Advice | Payer: 59 | Attending: Emergency Medicine | Admitting: Emergency Medicine

## 2018-02-11 DIAGNOSIS — E039 Hypothyroidism, unspecified: Secondary | ICD-10-CM | POA: Insufficient documentation

## 2018-02-11 DIAGNOSIS — Z5321 Procedure and treatment not carried out due to patient leaving prior to being seen by health care provider: Secondary | ICD-10-CM | POA: Diagnosis not present

## 2018-02-11 DIAGNOSIS — Z7901 Long term (current) use of anticoagulants: Secondary | ICD-10-CM | POA: Insufficient documentation

## 2018-02-11 DIAGNOSIS — I10 Essential (primary) hypertension: Secondary | ICD-10-CM | POA: Diagnosis not present

## 2018-02-11 DIAGNOSIS — H5711 Ocular pain, right eye: Secondary | ICD-10-CM | POA: Diagnosis present

## 2018-02-11 MED ORDER — FLUORESCEIN SODIUM 1 MG OP STRP
1.0000 | ORAL_STRIP | Freq: Once | OPHTHALMIC | Status: AC
  Administered 2018-02-11: 1 via OPHTHALMIC
  Filled 2018-02-11: qty 1

## 2018-02-11 MED ORDER — TETRACAINE HCL 0.5 % OP SOLN
2.0000 [drp] | Freq: Once | OPHTHALMIC | Status: AC
  Administered 2018-02-11: 2 [drp] via OPHTHALMIC
  Filled 2018-02-11: qty 4

## 2018-02-11 MED ORDER — PROPARACAINE HCL 0.5 % OP SOLN: 1.0000 [drp] | Freq: Once | OPHTHALMIC | Status: DC

## 2018-02-11 NOTE — ED Provider Notes (Cosign Needed)
[facility] DEPT Provider Note   CSN: [id number] Arrival date & time: 02/11/18  1034     History   Chief Complaint Chief Complaint  Patient presents with  . Right Eye Edema    HPI [name] is a 37 y.o. female with a PMHx of PE on xarelto, HTN, hyperthyroidism, and other conditions listed below, who presents to the ED with complaints of right eye pain, redness, drainage, and swelling for 3 days.  Patient states that she woke up with the symptoms 3 days ago, 2 days ago she went to her PCP who prescribed her Cipro drops which she has been using but they have not been helping.  She states that her symptoms are worsening, her eye is starting to swell and having pain with eye movement, so she came here for further evaluation.  She states that the pain is a sharp throbbing pain with eye movement, located behind the right eye.  She states that her drainage was initially yellowish but now is mostly watery.  She has not tried anything else other than her prescribed eyedrops, no other known aggravating factors aside from eye movement.  No known sick contacts, no recent swimming, and does not wear contact lenses.  She denies any vision changes, foreign body sensation, fevers, chills, URI symptoms, cough, or any other complaints at this time.  The history is provided by the patient and medical records. No language interpreter was used.    Past Medical History:  Diagnosis Date  . Complication of anesthesia    "difficulty waking up and will fight when woken up"  . Gallstones   . Hernia, ventral   . History of blood clots   . History of pulmonary embolus (PE)   . Hypertension   . Hyperthyroidism    01/15/17- in the past  . Shortness of breath dyspnea     Patient Active Problem List   Diagnosis Date Noted  . Left upper quadrant pain 03/03/2017  . Generalized abdominal wall pain 03/03/2017  . Obesity, Class III, BMI 40-49.9 (morbid obesity) ([place]) 03/03/2017    . Small bowel obstruction due to adhesions ([place]) 02/24/2017  . Essential hypertension 02/24/2017  . History of pulmonary embolism 02/24/2017  . Hypothyroidism 02/24/2017  . S/P laparoscopic cholecystectomy 01/18/2017  . Post op infection 05/03/2016  . Incisional hernia 04/13/2016    Past Surgical History:  Procedure Laterality Date  . BREAST SURGERY     cyst removed from right  . CESAREAN SECTION    . CHOLECYSTECTOMY  01/18/2017  . CHOLECYSTECTOMY N/A 01/18/2017   Procedure: LAPAROSCOPIC CHOLECYSTECTOMY;  Surgeon: Coralie Keens, MD;  Location: [facility];  Service: General;  Laterality: N/A;  . INCISIONAL HERNIA REPAIR  04/13/2016  . INCISIONAL HERNIA REPAIR N/A 04/13/2016   Procedure: INCISIONAL HERNIA REPAIR;  Surgeon: Coralie Keens, MD;  Location: [facility];  Service: General;  Laterality: N/A;  . INSERTION OF MESH N/A 04/13/2016   Procedure: INSERTION OF MESH;  Surgeon: Coralie Keens, MD;  Location: [facility];  Service: General;  Laterality: N/A;  . LAPAROTOMY N/A 03/05/2017   Procedure: EXPLORATORY LAPAROTOMY;  Surgeon: Rolm Bookbinder, MD;  Location: [facility];  Service: General;  Laterality: N/A;  . LYSIS OF ADHESION N/A 03/05/2017   Procedure: LYSIS OF ADHESIONs;  Surgeon: Rolm Bookbinder, MD;  Location: [facility];  Service: General;  Laterality: N/A;  . PILONIDAL CYST / SINUS EXCISION    . removal of sweat gland Bilateral    arms  OB History   None      Home Medications    Prior to Admission medications   Medication Sig Start Date End Date Taking? Authorizing Provider  acetaminophen (TYLENOL) 325 MG tablet Take 2 tablets (650 mg total) by mouth every 6 (six) hours as needed. Do not take more than 4000mg  of tylenol per day 12/26/17   Couture, Cortni S, PA-C  levothyroxine (SYNTHROID, LEVOTHROID) 112 MCG tablet Take 112 mcg by mouth daily. 07/15/17   [provider]  lidocaine (XYLOCAINE) 5 % ointment Apply 1 application topically as needed. Patient not taking:  Reported on 08/28/2017 07/30/17   Blanchie Dessert, MD  lisinopril (PRINIVIL,ZESTRIL) 5 MG tablet Take 5 mg by mouth daily. 11/11/15 07/30/17  [provider]  lisinopril (PRINIVIL,ZESTRIL) 5 MG tablet Take 5 mg by mouth daily. 11/11/15   [provider]  metoprolol (LOPRESSOR) 50 MG tablet Take 50 mg by mouth 2 (two) times daily. 09/12/15   [provider]  oxyCODONE-acetaminophen (PERCOCET/ROXICET) 5-325 MG tablet Take 1-2 tablets by mouth every 6 (six) hours as needed for severe pain. Patient not taking: Reported on 08/28/2017 07/30/17   Blanchie Dessert, MD  Rivaroxaban (XARELTO) 20 MG TABS Take 20 mg by mouth every evening.     [provider]  tiZANidine (ZANAFLEX) 4 MG tablet Take 4 mg by mouth at bedtime as needed for spasms. 07/30/17   [provider]  traZODone (DESYREL) 50 MG tablet Take 50 mg by mouth at bedtime. 06/24/17   [provider]  valACYclovir (VALTREX) 1000 MG tablet Take 1 tablet (1,000 mg total) by mouth 3 (three) times daily. Patient not taking: Reported on 08/28/2017 07/30/17   Blanchie Dessert, MD    Family History Family History  Problem Relation Age of Onset  . Hypertension Other   . Hyperlipidemia Other     Social History Social History   Tobacco Use  . Smoking status: Never Smoker  . Smokeless tobacco: Never Used  Substance Use Topics  . Alcohol use: Yes    Comment: occasionally  . Drug use: No     Allergies   Penicillins   Review of Systems Review of Systems  Constitutional: Negative for chills and fever.  HENT: Negative for rhinorrhea and sore throat.   Eyes: Positive for pain, discharge and redness. Negative for visual disturbance.  Respiratory: Negative for cough.   Allergic/Immunologic: Negative for immunocompromised state.     Physical Exam Updated Vital Signs BP (!) 148/108   Pulse 68   Temp 98.1 F (36.7 C) (Oral)   Resp 15   Ht 5\' 1"  (1.549 m)   Wt 104.3 kg (230 lb)    LMP 01/11/2018 (Approximate)   SpO2 100%   BMI 43.46 kg/m   Physical Exam  Constitutional: She is oriented to person, place, and time. Vital signs are normal. She appears well-developed and well-nourished.  Non-toxic appearance. No distress.  Afebrile, nontoxic, NAD  HENT:  Head: Normocephalic and atraumatic.  Mouth/Throat: Mucous membranes are normal.  Eyes: Pupils are equal, round, and reactive to light. EOM are normal. Lids are everted and swept, no foreign bodies found. Right eye exhibits discharge. No foreign body present in the right eye. Left eye exhibits no discharge. Right conjunctiva is injected. Right conjunctiva has no hemorrhage. Left conjunctiva is not injected. Left conjunctiva has no hemorrhage.  Slit lamp exam:      The right eye shows no corneal abrasion, no corneal flare, no corneal ulcer, no foreign body, no hyphema,  no hypopyon and no fluorescein uptake.  Slight puffiness around the R eye that does not seem to be from the eyelids but rather from behind the eye, diffuse tenderness around the periorbital area where the puffiness/swelling is, no overlying skin changes to the eyelids themselves, difficult to say whether she has proptosis but the eye looks perhaps slightly more bulging than the other eye, mild watery drainage from the eye, reports pain with eye movement although EOM intact without nystagmus, PERRL, no photophobia, conjunctiva globally injected, fluorescein exam without uptake, no corneal flares or ulcerations; no hypopyon or hyphema noted; tonopen reading 22-23 on R eye.   Visual Acuity: R 20/25 L 20/25 Bilateral 20/20  Neck: Normal range of motion. Neck supple.  Cardiovascular: Normal rate and intact distal pulses.  Pulmonary/Chest: Effort normal. No respiratory distress.  Abdominal: Normal appearance. She exhibits no distension.  Musculoskeletal: Normal range of motion.  Neurological: She is alert and oriented to person, place, and time. She has normal  strength. No sensory deficit.  Skin: Skin is warm, dry and intact. No rash noted.  Psychiatric: She has a normal mood and affect. Her behavior is normal.  Nursing note and vitals reviewed.    ED Treatments / Results  Labs (all labs ordered are listed, but only abnormal results are displayed) Labs Reviewed - No data to display  EKG None  Radiology No results found.  Procedures Procedures (including critical care time)  Medications Ordered in ED Medications  fluorescein ophthalmic strip 1 strip (1 strip Right Eye Given 02/11/18 1415)  tetracaine (PONTOCAINE) 0.5 % ophthalmic solution 2 drop (2 drops Right Eye Given 02/11/18 1416)     Initial Impression / Assessment and Plan / ED Course  I have reviewed the triage vital signs and the nursing notes.  Pertinent labs & imaging results that were available during my care of the patient were reviewed by me and considered in my medical decision making (see chart for details).     37 y.o. female here with R eye pain, swelling, redness, and drainage x3 days, was treated with Cipro drops but continues to have worsening symptoms, now having pain with eye movement.  On exam, slight puffiness around the R eye that does not seem to be from the eyelids but rather from behind the eye, difficult to say whether she has proptosis but the eye looks perhaps slightly more bulging than the other eye, reports pain with eye movement although EOM intact, PERRL, no photophobia, conjunctiva injected, fluorescein exam without uptake, tonopen reading 22-23 which is marginally elevated. Given concern for possible orbital cellulitis, will proceed with CT orbits; Discussed case with my attending Dr. Wilson Singer who agrees with plan. Will reassess shortly.   3:26 PM Apparently the pt has left without getting the CT. She told someone she was going to leave to go to her daughter's graduation and would be back. Pt not in room. No one reported this to me before she left,  therefore I was unable to go through the r/b/a with her, or get her to sign out AMA. Pt eloped in stable condition.    Final Clinical Impressions(s) / ED Diagnoses   Final diagnoses:  Acute right eye pain    ED Discharge Orders    [address] [place], [place] 02/11/18 1528

## 2018-02-11 NOTE — ED Notes (Signed)
Right Eye is 20/25 Left eye is 20/25. Bilateral vision is 20/20. ED Provider has been made aware.

## 2018-02-11 NOTE — ED Triage Notes (Signed)
Patient c/o eye swelling and pain x 2 days. Patient went to her physician 2 days ago and was given eye drops and told the patient if no better to come to the ED. Patient states she was treated for pink eye.

## 2018-02-11 NOTE — ED Notes (Signed)
Pt told Triage RN on way out of ED that patient stated she had to leave in order to go to family members graduation. RN has informed ED Provider.

## 2018-02-12 ENCOUNTER — Encounter (HOSPITAL_COMMUNITY): Payer: Self-pay | Admitting: Emergency Medicine

## 2018-02-12 ENCOUNTER — Emergency Department (HOSPITAL_COMMUNITY): Payer: 59

## 2018-02-12 ENCOUNTER — Emergency Department (HOSPITAL_COMMUNITY)
Admission: EM | Admit: 2018-02-12 | Discharge: 2018-02-12 | Disposition: A | Discharge status: Home / Self Care | Payer: 59 | Attending: Emergency Medicine | Admitting: Emergency Medicine

## 2018-02-12 ENCOUNTER — Other Ambulatory Visit: Payer: Self-pay

## 2018-02-12 DIAGNOSIS — Z7901 Long term (current) use of anticoagulants: Secondary | ICD-10-CM | POA: Insufficient documentation

## 2018-02-12 DIAGNOSIS — I10 Essential (primary) hypertension: Secondary | ICD-10-CM | POA: Diagnosis not present

## 2018-02-12 DIAGNOSIS — Z86718 Personal history of other venous thrombosis and embolism: Secondary | ICD-10-CM | POA: Insufficient documentation

## 2018-02-12 DIAGNOSIS — Z79899 Other long term (current) drug therapy: Secondary | ICD-10-CM | POA: Insufficient documentation

## 2018-02-12 DIAGNOSIS — L03213 Periorbital cellulitis: Secondary | ICD-10-CM | POA: Diagnosis not present

## 2018-02-12 DIAGNOSIS — H5711 Ocular pain, right eye: Secondary | ICD-10-CM | POA: Diagnosis present

## 2018-02-12 LAB — BASIC METABOLIC PANEL
Anion gap: 9 (ref 5–15)
BUN: 9 mg/dL (ref 6–20)
CALCIUM: 8.9 mg/dL (ref 8.9–10.3)
CO2: 25 mmol/L (ref 22–32)
CREATININE: 0.88 mg/dL (ref 0.44–1.00)
Chloride: 108 mmol/L (ref 101–111)
GFR calc non Af Amer: >60 mL/min (ref >60)
Glucose, Bld: 102 mg/dL — ABNORMAL HIGH (ref 65–99)
Potassium: 3.6 mmol/L (ref 3.5–5.1)
SODIUM: 142 mmol/L (ref 135–145)

## 2018-02-12 LAB — CBC
HCT: 36.1 % (ref 36.0–46.0)
Hemoglobin: 12.3 g/dL (ref 12.0–15.0)
MCH: 33 pg (ref 26.0–34.0)
MCHC: 34.1 g/dL (ref 30.0–36.0)
MCV: 96.8 fL (ref 78.0–100.0)
Platelets: 334 10*3/uL (ref 150–400)
RBC: 3.73 MIL/uL — ABNORMAL LOW (ref 3.87–5.11)
RDW: 12.6 % (ref 11.5–15.5)
WBC: 6.2 10*3/uL (ref 4.0–10.5)

## 2018-02-12 MED ORDER — CLINDAMYCIN HCL 150 MG PO CAPS: 450.0000 mg | ORAL_CAPSULE | Freq: Three times a day (TID) | ORAL | 0 refills | Status: AC

## 2018-02-12 MED ORDER — IOHEXOL 300 MG/ML  SOLN
75.0000 mL | Freq: Once | INTRAMUSCULAR | Status: AC | PRN
  Administered 2018-02-12: 75 mL via INTRAVENOUS

## 2018-02-12 NOTE — ED Provider Notes (Signed)
[facility] DEPT Provider Note   CSN: [id number] Arrival date & time: 02/12/18  6578     History   Chief Complaint Chief Complaint  Patient presents with  . Eye Pain    HPI KAILEEN BRONKEMA is a 37 y.o. female presenting for evaluation of right eye pain and redness.  Patient states that she started having problems with her right eye on Tuesday.  She was using over-the-counter eyedrops and washing her eye out, which improved the drainage but increased irritation.  She saw her primary care doctor on Wednesday, who prescribed Cipro eyedrops.  She has been using this without improvement of symptoms.  She reports worsening redness, pain, and irritation.  She was evaluated yesterday in the ER, but had to leave prior to work-up being completed.  She reports blurriness of her right eye, but no vision loss.  She has associated photophobia.  She denies fevers, chills, symptoms of the left eye, ear pain, nasal congestion.  She denies trauma to the eye.  She denies difficulty closing her eye or inability to move her eye.  She wears glasses, sees an optometrist, although has not seen anybody recently.  She does not wear contacts.  No sick contacts. PMH of hypothyroidism, PEs, and HTN. Takes medication for all the above, no recent changes.   HPI  Past Medical History:  Diagnosis Date  . Complication of anesthesia    "difficulty waking up and will fight when woken up"  . Gallstones   . Hernia, ventral   . History of blood clots   . History of pulmonary embolus (PE)   . Hypertension   . Hyperthyroidism    01/15/17- in the past  . Shortness of breath dyspnea     Patient Active Problem List   Diagnosis Date Noted  . Left upper quadrant pain 03/03/2017  . Generalized abdominal wall pain 03/03/2017  . Obesity, Class III, BMI 40-49.9 (morbid obesity) ([place]) 03/03/2017  . Small bowel obstruction due to adhesions ([place]) 02/24/2017  . Essential hypertension 02/24/2017  .  History of pulmonary embolism 02/24/2017  . Hypothyroidism 02/24/2017  . S/P laparoscopic cholecystectomy 01/18/2017  . Post op infection 05/03/2016  . Incisional hernia 04/13/2016    Past Surgical History:  Procedure Laterality Date  . BREAST SURGERY     cyst removed from right  . CESAREAN SECTION    . CHOLECYSTECTOMY  01/18/2017  . CHOLECYSTECTOMY N/A 01/18/2017   Procedure: LAPAROSCOPIC CHOLECYSTECTOMY;  Surgeon: Coralie Keens, MD;  Location: [facility];  Service: General;  Laterality: N/A;  . INCISIONAL HERNIA REPAIR  04/13/2016  . INCISIONAL HERNIA REPAIR N/A 04/13/2016   Procedure: INCISIONAL HERNIA REPAIR;  Surgeon: Coralie Keens, MD;  Location: [facility];  Service: General;  Laterality: N/A;  . INSERTION OF MESH N/A 04/13/2016   Procedure: INSERTION OF MESH;  Surgeon: Coralie Keens, MD;  Location: [facility];  Service: General;  Laterality: N/A;  . LAPAROTOMY N/A 03/05/2017   Procedure: EXPLORATORY LAPAROTOMY;  Surgeon: Rolm Bookbinder, MD;  Location: [facility];  Service: General;  Laterality: N/A;  . LYSIS OF ADHESION N/A 03/05/2017   Procedure: LYSIS OF ADHESIONs;  Surgeon: Rolm Bookbinder, MD;  Location: [facility];  Service: General;  Laterality: N/A;  . PILONIDAL CYST / SINUS EXCISION    . removal of sweat gland Bilateral    arms     OB History   None      Home Medications    Prior to Admission medications   Medication  Sig Start Date End Date Taking? Authorizing Provider  acetaminophen (TYLENOL) 325 MG tablet Take 2 tablets (650 mg total) by mouth every 6 (six) hours as needed. Do not take more than 4000mg  of tylenol per day 12/26/17   Couture, Cortni S, PA-C  clindamycin (CLEOCIN) 150 MG capsule Take 3 capsules (450 mg total) by mouth 3 (three) times daily for 10 days. 02/12/18 02/22/18  Noni Stonesifer, PA-C  levothyroxine (SYNTHROID, LEVOTHROID) 112 MCG tablet Take 112 mcg by mouth daily. 07/15/17   [provider]  lidocaine (XYLOCAINE) 5 % ointment Apply 1  application topically as needed. Patient not taking: Reported on 08/28/2017 07/30/17   Blanchie Dessert, MD  lisinopril (PRINIVIL,ZESTRIL) 5 MG tablet Take 5 mg by mouth daily. 11/11/15 07/30/17  [provider]  lisinopril (PRINIVIL,ZESTRIL) 5 MG tablet Take 5 mg by mouth daily. 11/11/15   [provider]  metoprolol (LOPRESSOR) 50 MG tablet Take 50 mg by mouth 2 (two) times daily. 09/12/15   [provider]  oxyCODONE-acetaminophen (PERCOCET/ROXICET) 5-325 MG tablet Take 1-2 tablets by mouth every 6 (six) hours as needed for severe pain. Patient not taking: Reported on 08/28/2017 07/30/17   Blanchie Dessert, MD  Rivaroxaban (XARELTO) 20 MG TABS Take 20 mg by mouth every evening.     [provider]  tiZANidine (ZANAFLEX) 4 MG tablet Take 4 mg by mouth at bedtime as needed for spasms. 07/30/17   [provider]  traZODone (DESYREL) 50 MG tablet Take 50 mg by mouth at bedtime. 06/24/17   [provider]  valACYclovir (VALTREX) 1000 MG tablet Take 1 tablet (1,000 mg total) by mouth 3 (three) times daily. Patient not taking: Reported on 08/28/2017 07/30/17   Blanchie Dessert, MD    Family History Family History  Problem Relation Age of Onset  . Hypertension Other   . Hyperlipidemia Other     Social History Social History   Tobacco Use  . Smoking status: Never Smoker  . Smokeless tobacco: Never Used  Substance Use Topics  . Alcohol use: Yes    Comment: occasionally  . Drug use: No     Allergies   Penicillins; Other; and Pollen extract   Review of Systems Review of Systems  Eyes: Positive for photophobia, pain, discharge, redness and visual disturbance (blurriness).  Hematological: Bruises/bleeds easily.  All other systems reviewed and are negative.    Physical Exam Updated Vital Signs BP (!) 160/98   Pulse 86   Temp 98.5 F (36.9 C) (Oral)   Resp 13   Ht 5\' 1"  (1.549 m)   Wt 104.3 kg (230 lb)   LMP 01/14/2018  Comment: signed waiver  SpO2 99%   BMI 43.46 kg/m   Physical Exam  Constitutional: She is oriented to person, place, and time. She appears well-developed and well-nourished. No distress.  Pt in NAD  HENT:  Head: Normocephalic and atraumatic.  Right Ear: Tympanic membrane, external ear and ear canal normal.  Left Ear: Tympanic membrane, external ear and ear canal normal.  Nose: Nose normal.  Mouth/Throat: Uvula is midline, oropharynx is clear and moist and mucous membranes are normal.  Eyes: Pupils are equal, round, and reactive to light. EOM are normal. Right eye exhibits chemosis. Right eye exhibits no discharge and no exudate. No foreign body present in the right eye. Right conjunctiva is injected. Left conjunctiva is not injected.  Soft tissue swelling surrounding the right eye.  Injection of the conjunctive and sclera with chemosis of the right eye.  EOMI and PERRLA. ?consensual photophobia.   Neck: Normal range of motion. Neck supple.  Cardiovascular: Normal rate, regular rhythm and intact distal pulses.  Pulmonary/Chest: Effort normal and breath sounds normal. No respiratory distress. She has no wheezes.  Abdominal: Soft. Bowel sounds are normal. She exhibits no distension. There is no tenderness.  Musculoskeletal: Normal range of motion.  Neurological: She is alert and oriented to person, place, and time.  Skin: Skin is warm and dry.  Psychiatric: She has a normal mood and affect.  Nursing note and vitals reviewed.    ED Treatments / Results  Labs (all labs ordered are listed, but only abnormal results are displayed) Labs Reviewed  CBC - Abnormal; Notable for the following components:      Result Value   RBC 3.73 (*)    All other components within normal limits  BASIC METABOLIC PANEL - Abnormal; Notable for the following components:   Glucose, Bld 102 (*)    All other components within normal limits    EKG None  Radiology Ct Orbits W Contrast  Result Date:  02/12/2018 CLINICAL DATA:  37 year old female with swollen and erythematous right eyelid for 4 days. No known injury. EXAM: CT ORBITS WITH CONTRAST TECHNIQUE: Multidetector CT images was performed according to the standard protocol following intravenous contrast administration. CONTRAST:  70mL OMNIPAQUE IOHEXOL 300 MG/ML  SOLN COMPARISON:  Neck CT 08/28/2017. FINDINGS: Orbits: Intact orbital walls. The globes appear symmetric and normal. There is subtle asymmetric right orbit preseptal soft tissue thickening, most apparent along the inferior aspect of the globe on series 4, image 24. No fluid collection. The other bilateral orbit soft tissues appear normal with no postseptal inflammation or abnormality. The nasolacrimal ducts appear within normal limits. Visualized sinuses: Clear. The bilateral tympanic cavities and mastoids are clear. Osseous structures: No osseous abnormality identified. Soft tissues: Negative visible pharynx, parapharyngeal spaces, masticator spaces and parotid spaces. Limited intracranial: Negative visible brain parenchyma. The major vascular structures in the upper neck and at the skull base are patent, including the cavernous sinus. IMPRESSION: Subtle preseptal right orbit soft tissue thickening. No abscess, or other abnormality identified. Electronically Signed   By: Genevie Ann M.D.   On: 02/12/2018 09:48    Procedures Procedures (including critical care time)  Medications Ordered in ED Medications  iohexol (OMNIPAQUE) 300 MG/ML solution 75 mL (75 mLs Intravenous Contrast Given 02/12/18 0929)     Initial Impression / Assessment and Plan / ED Course  I have reviewed the triage vital signs and the nursing notes.  Pertinent labs & imaging results that were available during my care of the patient were reviewed by me and considered in my medical decision making (see chart for details).     Patient presenting for evaluation of right eye redness and swelling.  Physical exam shows  patient who is afebrile not tachycardic.  She appears nontoxic.  Periorbital swelling and injection of the right eye.  EOMI and PERRLA.  Exam appears consistent with description of exam from yesterday.  Full eye exam was performed yesterday including Tono-Pen without significant abnormalities.  Will not repeat fluorescein stain and Tono-Pen today.  No obvious consensual photophobia, patient winces with direct light and has no response to consensual light, however reports some irritation with consensual light.  Will obtain labs and CT to rule out orbital cellulitis.  Labs reassuring, no white count.  CT shows preseptal cellulitis without orbital cellulitis.  Will place patient on clindamycin and have her follow-up with ophthalmology early  next week.  Strict return precautions given, including signs of orbital cellulitis.  At this time, patient appears safe for discharge.  Patient states she understands and agrees to plan.  Final Clinical Impressions(s) / ED Diagnoses   Final diagnoses:  Preseptal cellulitis of right eye    ED Discharge Orders        Ordered    clindamycin (CLEOCIN) 150 MG capsule  3 times daily     02/12/18 0955       Franchot Heidelberg, PA-C 02/12/18 1132    Jola Schmidt, MD 02/12/18 1534

## 2018-02-12 NOTE — ED Triage Notes (Signed)
Patient complaining of eye pain and swelling. Patient states she came on 02/11/2018 to see a doctor. The doctor prescribed medication but the patient states the medication is not working and is getting worse.

## 2018-02-12 NOTE — Discharge Instructions (Signed)
Take antibiotics as prescribed. Take the entire course, even if your symptoms improve.  Follow up with the eye doctor on Monday for further evaluation of your eye.  Return to the ER if you are unable to move your eye, your eye is bulging out, you have vision loss, or with any new or concerning symptoms.

## 2018-03-08 ENCOUNTER — Encounter (HOSPITAL_COMMUNITY): Payer: Self-pay

## 2018-03-08 ENCOUNTER — Other Ambulatory Visit: Payer: Self-pay

## 2018-03-08 ENCOUNTER — Emergency Department (HOSPITAL_COMMUNITY)
Admission: EM | Admit: 2018-03-08 | Discharge: 2018-03-08 | Disposition: A | Discharge status: Home / Self Care | Payer: 59 | Attending: Emergency Medicine | Admitting: Emergency Medicine

## 2018-03-08 DIAGNOSIS — M542 Cervicalgia: Secondary | ICD-10-CM | POA: Diagnosis not present

## 2018-03-08 DIAGNOSIS — Z79899 Other long term (current) drug therapy: Secondary | ICD-10-CM | POA: Insufficient documentation

## 2018-03-08 DIAGNOSIS — J029 Acute pharyngitis, unspecified: Secondary | ICD-10-CM | POA: Insufficient documentation

## 2018-03-08 DIAGNOSIS — I10 Essential (primary) hypertension: Secondary | ICD-10-CM | POA: Diagnosis not present

## 2018-03-08 LAB — I-STAT CHEM 8, ED
BUN: 8 mg/dL (ref 6–20)
CALCIUM ION: 1.16 mmol/L (ref 1.15–1.40)
CREATININE: 0.9 mg/dL (ref 0.44–1.00)
Chloride: 103 mmol/L (ref 98–111)
GLUCOSE: 98 mg/dL (ref 70–99)
HEMATOCRIT: 36 % (ref 36.0–46.0)
HEMOGLOBIN: 12.2 g/dL (ref 12.0–15.0)
Potassium: 3.6 mmol/L (ref 3.5–5.1)
Sodium: 141 mmol/L (ref 135–145)
TCO2: 24 mmol/L (ref 22–32)

## 2018-03-08 LAB — I-STAT BETA HCG BLOOD, ED (MC, WL, AP ONLY): I-stat hCG, quantitative: <5 m[IU]/mL (ref <5)

## 2018-03-08 LAB — GROUP A STREP BY PCR: Group A Strep by PCR: NOT DETECTED

## 2018-03-08 MED ORDER — KETOROLAC TROMETHAMINE 60 MG/2ML IM SOLN
30.0000 mg | Freq: Once | INTRAMUSCULAR | Status: DC
Start: 2018-03-08 — End: 2018-03-08

## 2018-03-08 MED ORDER — KETOROLAC TROMETHAMINE 15 MG/ML IJ SOLN
30.0000 mg | Freq: Once | INTRAMUSCULAR | Status: AC
  Administered 2018-03-08: 30 mg via INTRAMUSCULAR
  Filled 2018-03-08: qty 2

## 2018-03-08 MED ORDER — METHOCARBAMOL 500 MG PO TABS: 500.0000 mg | ORAL_TABLET | Freq: Every evening | ORAL | 0 refills | Status: DC | PRN

## 2018-03-08 MED ORDER — DIAZEPAM 5 MG PO TABS
5.0000 mg | ORAL_TABLET | Freq: Once | ORAL | Status: AC
  Administered 2018-03-08: 5 mg via ORAL
  Filled 2018-03-08: qty 1

## 2018-03-08 NOTE — Discharge Instructions (Addendum)
Take Ibuprofen 600mg  2-3 times daily and Robaxin as directed for muscle pain Try a heating pad for sore/stiff muscles Please follow up with your doctor

## 2018-03-08 NOTE — ED Provider Notes (Signed)
Medical screening examination/treatment/procedure(s) were conducted as a shared visit with non-physician practitioner(s) and myself.  I personally evaluated the patient during the encounter.  None 37 year old female presents with 24-hour history of bilateral upper cervical neck pain.  She has not had any infectious symptoms.  No radicular symptoms down her arm.  She has been point tender along her paraspinal muscles.  Suspect trapezius muscle strain.  Treated with medications feels better and stable for discharge.   Lacretia Leigh, MD 03/08/18 1016

## 2018-03-08 NOTE — ED Triage Notes (Signed)
Pt arrives from home via POV. Pt reports neck pain that began late last night. Pt reports that she woke this am with worsening neck pain that extends down to the shoulders. Pt reports waking with a sore throat and difficulty swallowing. Pt denies any sick contacts.

## 2018-03-08 NOTE — ED Notes (Signed)
PA to bedside
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
## 2018-05-27 ENCOUNTER — Other Ambulatory Visit: Payer: Self-pay | Admitting: Surgical Oncology

## 2018-05-30 ENCOUNTER — Other Ambulatory Visit: Payer: 59

## 2018-06-08 ENCOUNTER — Other Ambulatory Visit: Payer: Self-pay | Admitting: Surgery

## 2018-06-09 ENCOUNTER — Other Ambulatory Visit: Payer: Medicaid Other

## 2018-06-09 ENCOUNTER — Ambulatory Visit
Admission: RE | Admit: 2018-06-09 | Discharge: 2018-06-09 | Disposition: A | Discharge status: Home / Self Care | Payer: Medicaid Other | Source: Ambulatory Visit | Attending: Surgical Oncology | Admitting: Surgical Oncology

## 2018-07-05 ENCOUNTER — Other Ambulatory Visit: Payer: Self-pay

## 2018-07-05 ENCOUNTER — Emergency Department (HOSPITAL_COMMUNITY)
Admission: EM | Admit: 2018-07-05 | Discharge: 2018-07-05 | Disposition: A | Discharge status: Home / Self Care | Payer: Medicaid Other | Attending: Emergency Medicine | Admitting: Emergency Medicine

## 2018-07-05 ENCOUNTER — Emergency Department (HOSPITAL_COMMUNITY): Payer: Medicaid Other

## 2018-07-05 ENCOUNTER — Encounter (HOSPITAL_COMMUNITY): Payer: Self-pay | Admitting: Emergency Medicine

## 2018-07-05 DIAGNOSIS — K432 Incisional hernia without obstruction or gangrene: Secondary | ICD-10-CM | POA: Diagnosis not present

## 2018-07-05 DIAGNOSIS — E039 Hypothyroidism, unspecified: Secondary | ICD-10-CM | POA: Diagnosis not present

## 2018-07-05 DIAGNOSIS — I10 Essential (primary) hypertension: Secondary | ICD-10-CM | POA: Diagnosis not present

## 2018-07-05 DIAGNOSIS — Z79899 Other long term (current) drug therapy: Secondary | ICD-10-CM | POA: Diagnosis not present

## 2018-07-05 DIAGNOSIS — R109 Unspecified abdominal pain: Secondary | ICD-10-CM | POA: Diagnosis present

## 2018-07-05 LAB — COMPREHENSIVE METABOLIC PANEL
ALBUMIN: 3.4 g/dL — AB (ref 3.5–5.0)
ALT: 12 U/L (ref 0–44)
AST: 32 U/L (ref 15–41)
Alkaline Phosphatase: 56 U/L (ref 38–126)
Anion gap: 9 (ref 5–15)
BILIRUBIN TOTAL: 0.7 mg/dL (ref 0.3–1.2)
BUN: 5 mg/dL — AB (ref 6–20)
CO2: 25 mmol/L (ref 22–32)
CREATININE: 0.98 mg/dL (ref 0.44–1.00)
Calcium: 8.8 mg/dL — ABNORMAL LOW (ref 8.9–10.3)
Chloride: 105 mmol/L (ref 98–111)
GFR calc Af Amer: >60 mL/min (ref >60)
GLUCOSE: 118 mg/dL — AB (ref 70–99)
POTASSIUM: 4 mmol/L (ref 3.5–5.1)
Sodium: 139 mmol/L (ref 135–145)
Total Protein: 6.6 g/dL (ref 6.5–8.1)

## 2018-07-05 LAB — CBC WITH DIFFERENTIAL/PLATELET
ABS IMMATURE GRANULOCYTES: 0.03 10*3/uL (ref 0.00–0.07)
Basophils Absolute: 0 10*3/uL (ref 0.0–0.1)
Basophils Relative: 1 %
EOS ABS: 0.1 10*3/uL (ref 0.0–0.5)
Eosinophils Relative: 1 %
HEMATOCRIT: 35.8 % — AB (ref 36.0–46.0)
HEMOGLOBIN: 11.3 g/dL — AB (ref 12.0–15.0)
IMMATURE GRANULOCYTES: 1 %
LYMPHS ABS: 2.1 10*3/uL (ref 0.7–4.0)
LYMPHS PCT: 35 %
MCH: 30.7 pg (ref 26.0–34.0)
MCHC: 31.6 g/dL (ref 30.0–36.0)
MCV: 97.3 fL (ref 80.0–100.0)
Monocytes Absolute: 0.4 10*3/uL (ref 0.1–1.0)
Monocytes Relative: 7 %
NEUTROS ABS: 3.4 10*3/uL (ref 1.7–7.7)
NEUTROS PCT: 55 %
Platelets: 345 10*3/uL (ref 150–400)
RBC: 3.68 MIL/uL — ABNORMAL LOW (ref 3.87–5.11)
RDW: 12.9 % (ref 11.5–15.5)
WBC: 6.1 10*3/uL (ref 4.0–10.5)
nRBC: 0 % (ref 0.0–0.2)

## 2018-07-05 LAB — LIPASE, BLOOD: LIPASE: 25 U/L (ref 11–51)

## 2018-07-05 MED ORDER — IOHEXOL 300 MG/ML  SOLN
100.0000 mL | Freq: Once | INTRAMUSCULAR | Status: AC | PRN
  Administered 2018-07-05: 100 mL via INTRAVENOUS

## 2018-07-05 NOTE — ED Triage Notes (Signed)
Pt states her hernia pain is getting worse- hurts with walking, coughing, or movement. She called Dr. Rush Farmer and he told her to come here. She endorses nausea.

## 2018-07-05 NOTE — ED Provider Notes (Signed)
[facility] EMERGENCY DEPARTMENT Provider Note   CSN: [id number] Arrival date & time: 07/05/18  1147     History   Chief Complaint Chief Complaint  Patient presents with  . Hernia    HPI [name] is a 37 y.o. female.  HPI  37 year old female presents today with history of incisional hernia repair with complaints of abdominal pain.  Patient notes over the last several weeks she has had progressively worsening abdominal discomfort.  She notes she contacted general surgery today who instructed her to come to the emergency room for evaluation.  She denies any fever nausea vomiting, reports normal bowel movements.  She notes the pain is mostly in the left lower quadrant.  Past Medical History:  Diagnosis Date  . Complication of anesthesia    "difficulty waking up and will fight when woken up"  . Gallstones   . Hernia, ventral   . History of blood clots   . History of pulmonary embolus (PE)   . Hypertension   . Hyperthyroidism    01/15/17- in the past  . Shortness of breath dyspnea     Patient Active Problem List   Diagnosis Date Noted  . Left upper quadrant pain 03/03/2017  . Generalized abdominal wall pain 03/03/2017  . Obesity, Class III, BMI 40-49.9 (morbid obesity) ([place]) 03/03/2017  . Small bowel obstruction due to adhesions ([place]) 02/24/2017  . Essential hypertension 02/24/2017  . History of pulmonary embolism 02/24/2017  . Hypothyroidism 02/24/2017  . S/P laparoscopic cholecystectomy 01/18/2017  . Post op infection 05/03/2016  . Incisional hernia 04/13/2016    Past Surgical History:  Procedure Laterality Date  . BREAST SURGERY     cyst removed from right  . CESAREAN SECTION    . CHOLECYSTECTOMY  01/18/2017  . CHOLECYSTECTOMY N/A 01/18/2017   Procedure: LAPAROSCOPIC CHOLECYSTECTOMY;  Surgeon: Coralie Keens, MD;  Location: [facility];  Service: General;  Laterality: N/A;  . INCISIONAL HERNIA REPAIR  04/13/2016  . INCISIONAL HERNIA  REPAIR N/A 04/13/2016   Procedure: INCISIONAL HERNIA REPAIR;  Surgeon: Coralie Keens, MD;  Location: [facility];  Service: General;  Laterality: N/A;  . INSERTION OF MESH N/A 04/13/2016   Procedure: INSERTION OF MESH;  Surgeon: Coralie Keens, MD;  Location: [facility];  Service: General;  Laterality: N/A;  . LAPAROTOMY N/A 03/05/2017   Procedure: EXPLORATORY LAPAROTOMY;  Surgeon: Rolm Bookbinder, MD;  Location: [facility];  Service: General;  Laterality: N/A;  . LYSIS OF ADHESION N/A 03/05/2017   Procedure: LYSIS OF ADHESIONs;  Surgeon: Rolm Bookbinder, MD;  Location: [facility];  Service: General;  Laterality: N/A;  . PILONIDAL CYST / SINUS EXCISION    . removal of sweat gland Bilateral    arms     OB History   None      Home Medications    Prior to Admission medications   Medication Sig Start Date End Date Taking? Authorizing Provider  acetaminophen (TYLENOL) 325 MG tablet Take 2 tablets (650 mg total) by mouth every 6 (six) hours as needed. Do not take more than 4000mg  of tylenol per day 12/26/17   Couture, Cortni S, PA-C  FLUoxetine (PROZAC) 10 MG capsule Take 10 mg by mouth daily. 11/02/17   [provider]  Ibuprofen-diphenhydrAMINE Cit 200-38 MG TABS Take 2 tablets by mouth 2 (two) times daily as needed for pain.    [provider]  levothyroxine (SYNTHROID, LEVOTHROID) 112 MCG tablet Take 112 mcg by mouth daily. 07/15/17   [provider]  lisinopril (PRINIVIL,ZESTRIL) 5 MG tablet Take 5 mg by mouth daily. 11/11/15   [provider]  methocarbamol (ROBAXIN) 500 MG tablet Take 1 tablet (500 mg total) by mouth at bedtime and may repeat dose one time if needed. 03/08/18   Recardo Evangelist, PA-C  methocarbamol (ROBAXIN) 500 MG tablet Take 1 tablet (500 mg total) by mouth at bedtime and may repeat dose one time if needed. 03/08/18   Recardo Evangelist, PA-C  metoprolol (LOPRESSOR) 50 MG tablet Take 50 mg by mouth 2 (two) times daily. 09/12/15   [provider]    Rivaroxaban (XARELTO) 20 MG TABS Take 20 mg by mouth every evening.     [provider]    Family History Family History  Problem Relation Age of Onset  . Hypertension Other   . Hyperlipidemia Other     Social History Social History   Tobacco Use  . Smoking status: Never Smoker  . Smokeless tobacco: Never Used  Substance Use Topics  . Alcohol use: Yes    Comment: occasionally  . Drug use: No     Allergies   Penicillins and Pollen extract   Review of Systems Review of Systems  All other systems reviewed and are negative.    Physical Exam Updated Vital Signs BP 124/79 (BP Location: Right Arm)   Pulse 75   Temp 97.9 F (36.6 C) (Oral)   Resp 16   LMP 07/04/2018   SpO2 98%   Physical Exam  Constitutional: She is oriented to person, place, and time. She appears well-developed and well-nourished.  HENT:  Head: Normocephalic and atraumatic.  Eyes: Pupils are equal, round, and reactive to light. Conjunctivae are normal. Right eye exhibits no discharge. Left eye exhibits no discharge. No scleral icterus.  Neck: Normal range of motion. No JVD present. No tracheal deviation present.  Pulmonary/Chest: Effort normal. No stridor.  Abdominal:  Morbidly obese abdomen -tenderness palpation of left lower quadrant-defect noted in the abdominal wall below the umbilicus with bowel loops protruding-reducible  Neurological: She is alert and oriented to person, place, and time. Coordination normal.  Psychiatric: She has a normal mood and affect. Her behavior is normal. Judgment and thought content normal.  Nursing note and vitals reviewed.    ED Treatments / Results  Labs (all labs ordered are listed, but only abnormal results are displayed) Labs Reviewed  CBC WITH DIFFERENTIAL/PLATELET - Abnormal; Notable for the following components:      Result Value   RBC 3.68 (*)    Hemoglobin 11.3 (*)    HCT 35.8 (*)    All other components within normal limits   COMPREHENSIVE METABOLIC PANEL - Abnormal; Notable for the following components:   Glucose, Bld 118 (*)    BUN 5 (*)    Calcium 8.8 (*)    Albumin 3.4 (*)    All other components within normal limits  LIPASE, BLOOD    EKG None  Radiology Ct Abdomen Pelvis W Contrast  Result Date: 07/05/2018 CLINICAL DATA:  Abdominal pain, possible hernia EXAM: CT ABDOMEN AND PELVIS WITH CONTRAST TECHNIQUE: Multidetector CT imaging of the abdomen and pelvis was performed using the standard protocol following bolus administration of intravenous contrast. CONTRAST:  159mL OMNIPAQUE IOHEXOL 300 MG/ML  SOLN COMPARISON:  09/23/17 FINDINGS: Lower chest: No acute abnormality. Hepatobiliary: Gallbladder has been surgically removed. The liver is decreased in attenuation consistent with fatty infiltration. Pancreas: Unremarkable. No pancreatic ductal dilatation or surrounding inflammatory changes. Spleen: Normal in size  without focal abnormality. Adrenals/Urinary Tract: Adrenal glands are within normal limits. The kidneys demonstrate a normal enhancement pattern bilaterally. No renal calculi or obstructive changes are seen. The bladder is decompressed. Stomach/Bowel: No obstructive or inflammatory changes of the bowel are seen. The appendix is within normal limits. The stomach is unremarkable. Vascular/Lymphatic: No significant vascular findings are present. No enlarged abdominal or pelvic lymph nodes. Reproductive: Uterus and bilateral adnexa are unremarkable. Other: There is generalized laxity of the abdominal wall anteriorly stable from the previous exam. In addition to this there is a wide-mouth hernia identified inferior to the umbilicus which measures approximately 5.2 cm in greatest transverse diameter and approximately 3.5 cm in greatest craniocaudad projection. This has increased in the interval from the prior exam and demonstrates multiple loops of small bowel within although no incarceration is identified.  Postsurgical changes in the anterior abdominal wall are noted as well. Musculoskeletal: No acute or significant osseous findings. IMPRESSION: Midline ventral hernia just below the umbilicus which has increased in size in the interval from the prior exam now containing multiple loops of small bowel without incarceration. Fatty liver. No other focal abnormality is noted. Electronically Signed   By: Inez Catalina M.D.   On: 07/05/2018 14:51    Procedures Procedures (including critical care time)  Medications Ordered in ED Medications  iohexol (OMNIPAQUE) 300 MG/ML solution 100 mL (100 mLs Intravenous Contrast Given 07/05/18 1416)     Initial Impression / Assessment and Plan / ED Course  I have reviewed the triage vital signs and the nursing notes.  Pertinent labs & imaging results that were available during my care of the patient were reviewed by me and considered in my medical decision making (see chart for details).     37 year old female presents today with hernia.  No signs of obstruction or incarceration.  She appears to be no acute distress, normal bowel movements afebrile reassuring laboratory and CT evaluation.  General surgery consulted who agreed that outpatient follow-up would be appropriate.  Discharge instructions given, patient verbalized understanding and agreement to today's plan.  Final Clinical Impressions(s) / ED Diagnoses   Final diagnoses:  Incisional hernia, without obstruction or gangrene    ED Discharge Orders    None       Okey Regal, PA-C 07/05/18 1827    Tegeler, Gwenyth Allegra, MD 07/05/18 [phone]

## 2018-07-05 NOTE — ED Notes (Signed)
Patient called for vitals recheck, no answer.

## 2018-07-05 NOTE — ED Notes (Signed)
Ct called to start IV and get pt.

## 2018-07-05 NOTE — Discharge Instructions (Addendum)
Please read attached information. If you experience any new or worsening signs or symptoms please return to the emergency room for evaluation. Please follow-up with your primary care provider or specialist as discussed.  °

## 2018-07-05 NOTE — ED Provider Notes (Deleted)
Patient placed in Quick Look pathway, seen and evaluated   Chief Complaint: Hernia  HPI:  85 YOF presents with history of incisional hernia repair presents today with complaints of abdominal pain. Pt reports pain inn left lower that has progressively worsened.  She notes she contacted Dr. Trevor Mace office who recommended he come to the emergency room.  Patient notes no fever nausea vomiting, reports normal bowel movements.  ROS: Abdominal pain (one)  Physical Exam:   Gen: No distress  Neuro: Awake and Alert  Skin: Warm    Focused Exam: Morbidly obese abdomen, tenderness left lower abdomen   Initiation of care has begun. The patient has been counseled on the process, plan, and necessity for staying for the completion/evaluation, and the remainder of the medical screening examination    Okey Regal, Hershal Coria 07/05/18 1227

## 2018-07-14 ENCOUNTER — Other Ambulatory Visit: Payer: Self-pay | Admitting: Surgery

## 2018-07-18 ENCOUNTER — Emergency Department (HOSPITAL_COMMUNITY): Payer: Medicaid Other

## 2018-07-18 ENCOUNTER — Encounter (HOSPITAL_COMMUNITY): Payer: Self-pay

## 2018-07-18 ENCOUNTER — Emergency Department (HOSPITAL_COMMUNITY)
Admission: EM | Admit: 2018-07-18 | Discharge: 2018-07-18 | Disposition: A | Discharge status: Home / Self Care | Payer: Medicaid Other | Attending: Emergency Medicine | Admitting: Emergency Medicine

## 2018-07-18 DIAGNOSIS — Z79899 Other long term (current) drug therapy: Secondary | ICD-10-CM | POA: Insufficient documentation

## 2018-07-18 DIAGNOSIS — E039 Hypothyroidism, unspecified: Secondary | ICD-10-CM | POA: Insufficient documentation

## 2018-07-18 DIAGNOSIS — K439 Ventral hernia without obstruction or gangrene: Secondary | ICD-10-CM | POA: Diagnosis not present

## 2018-07-18 DIAGNOSIS — R1033 Periumbilical pain: Secondary | ICD-10-CM

## 2018-07-18 DIAGNOSIS — I10 Essential (primary) hypertension: Secondary | ICD-10-CM | POA: Insufficient documentation

## 2018-07-18 DIAGNOSIS — R109 Unspecified abdominal pain: Secondary | ICD-10-CM | POA: Diagnosis present

## 2018-07-18 LAB — URINALYSIS, ROUTINE W REFLEX MICROSCOPIC
Bilirubin Urine: NEGATIVE
Glucose, UA: NEGATIVE mg/dL
Hgb urine dipstick: NEGATIVE
KETONES UR: 5 mg/dL — AB
LEUKOCYTES UA: NEGATIVE
NITRITE: NEGATIVE
PROTEIN: NEGATIVE mg/dL
Specific Gravity, Urine: 1.023 (ref 1.005–1.030)
pH: 6 (ref 5.0–8.0)

## 2018-07-18 LAB — PREGNANCY, URINE: Preg Test, Ur: NEGATIVE

## 2018-07-18 LAB — CBC
HEMATOCRIT: 36.7 % (ref 36.0–46.0)
Hemoglobin: 11.7 g/dL — ABNORMAL LOW (ref 12.0–15.0)
MCH: 31 pg (ref 26.0–34.0)
MCHC: 31.9 g/dL (ref 30.0–36.0)
MCV: 97.1 fL (ref 80.0–100.0)
Platelets: 234 10*3/uL (ref 150–400)
RBC: 3.78 MIL/uL — ABNORMAL LOW (ref 3.87–5.11)
RDW: 12.6 % (ref 11.5–15.5)
WBC: 3.9 10*3/uL — AB (ref 4.0–10.5)
nRBC: 0 % (ref 0.0–0.2)

## 2018-07-18 LAB — COMPREHENSIVE METABOLIC PANEL
ALT: 12 U/L (ref 0–44)
ANION GAP: 6 (ref 5–15)
AST: 23 U/L (ref 15–41)
Albumin: 3.4 g/dL — ABNORMAL LOW (ref 3.5–5.0)
Alkaline Phosphatase: 56 U/L (ref 38–126)
BUN: 9 mg/dL (ref 6–20)
CO2: 28 mmol/L (ref 22–32)
Calcium: 8.8 mg/dL — ABNORMAL LOW (ref 8.9–10.3)
Chloride: 104 mmol/L (ref 98–111)
Creatinine, Ser: 0.99 mg/dL (ref 0.44–1.00)
GFR calc Af Amer: >60 mL/min (ref >60)
Glucose, Bld: 117 mg/dL — ABNORMAL HIGH (ref 70–99)
POTASSIUM: 3.7 mmol/L (ref 3.5–5.1)
Sodium: 138 mmol/L (ref 135–145)
TOTAL PROTEIN: 6.3 g/dL — AB (ref 6.5–8.1)
Total Bilirubin: 0.6 mg/dL (ref 0.3–1.2)

## 2018-07-18 LAB — LIPASE, BLOOD: LIPASE: 26 U/L (ref 11–51)

## 2018-07-18 MED ORDER — IOHEXOL 300 MG/ML  SOLN
100.0000 mL | Freq: Once | INTRAMUSCULAR | Status: AC | PRN
  Administered 2018-07-18: 100 mL via INTRAVENOUS

## 2018-07-18 MED ORDER — SODIUM CHLORIDE 0.9 % IV SOLN
INTRAVENOUS | Status: DC
  Administered 2018-07-18: 17:00:00 via INTRAVENOUS

## 2018-07-18 MED ORDER — ONDANSETRON HCL 4 MG/2ML IJ SOLN
4.0000 mg | Freq: Once | INTRAMUSCULAR | Status: AC
  Administered 2018-07-18: 4 mg via INTRAVENOUS
  Filled 2018-07-18: qty 2

## 2018-07-18 MED ORDER — HYDROMORPHONE HCL 1 MG/ML IJ SOLN
0.5000 mg | Freq: Once | INTRAMUSCULAR | Status: AC
  Administered 2018-07-18: 0.5 mg via INTRAVENOUS
  Filled 2018-07-18: qty 1

## 2018-07-18 NOTE — ED Provider Notes (Signed)
[facility] EMERGENCY DEPARTMENT Provider Note   CSN: [id number] Arrival date & time: 07/18/18  1543     History   Chief Complaint Chief Complaint  Patient presents with  . Abdominal Pain    HPI [name] is a 37 y.o. female.  Patient with a known ventral hernia.  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  Previous CTs that showed about a 5 cm defect.  Patient also states she has had a history of small bowel obstruction due to adhesions.  Patient presents today with some crampy abdominal pain decreased bowel movements.  Had some nausea.  She is concerned about either a bowel obstruction or incarcerated hernia.     Past Medical History:  Diagnosis Date  . Complication of anesthesia    "difficulty waking up and will fight when woken up"  . Gallstones   . Hernia, ventral   . History of blood clots   . History of pulmonary embolus (PE)   . Hypertension   . Hyperthyroidism    01/15/17- in the past  . Shortness of breath dyspnea     Patient Active Problem List   Diagnosis Date Noted  . Left upper quadrant pain 03/03/2017  . Generalized abdominal wall pain 03/03/2017  . Obesity, Class III, BMI 40-49.9 (morbid obesity) ([place]) 03/03/2017  . Small bowel obstruction due to adhesions ([place]) 02/24/2017  . Essential hypertension 02/24/2017  . History of pulmonary embolism 02/24/2017  . Hypothyroidism 02/24/2017  . S/P laparoscopic cholecystectomy 01/18/2017  . Post op infection 05/03/2016  . Incisional hernia 04/13/2016    Past Surgical History:  Procedure Laterality Date  . BREAST SURGERY     cyst removed from right  . CESAREAN SECTION    . CHOLECYSTECTOMY  01/18/2017  . CHOLECYSTECTOMY N/A 01/18/2017   Procedure: LAPAROSCOPIC CHOLECYSTECTOMY;  Surgeon: Coralie Keens, MD;  Location: [facility];  Service: General;  Laterality: N/A;  . INCISIONAL HERNIA REPAIR  04/13/2016  . INCISIONAL HERNIA REPAIR N/A 04/13/2016   Procedure:  INCISIONAL HERNIA REPAIR;  Surgeon: Coralie Keens, MD;  Location: [facility];  Service: General;  Laterality: N/A;  . INSERTION OF MESH N/A 04/13/2016   Procedure: INSERTION OF MESH;  Surgeon: Coralie Keens, MD;  Location: [facility];  Service: General;  Laterality: N/A;  . LAPAROTOMY N/A 03/05/2017   Procedure: EXPLORATORY LAPAROTOMY;  Surgeon: Rolm Bookbinder, MD;  Location: [facility];  Service: General;  Laterality: N/A;  . LYSIS OF ADHESION N/A 03/05/2017   Procedure: LYSIS OF ADHESIONs;  Surgeon: Rolm Bookbinder, MD;  Location: [facility];  Service: General;  Laterality: N/A;  . PILONIDAL CYST / SINUS EXCISION    . removal of sweat gland Bilateral    arms     OB History   None      Home Medications    Prior to Admission medications   Medication Sig Start Date End Date Taking? Authorizing Provider  divalproex (DEPAKOTE) 500 MG DR tablet Take 1,500 mg by mouth at bedtime. 06/28/18  Yes [provider]  DULoxetine (CYMBALTA) 30 MG capsule Take 30 mg by mouth 2 (two) times daily. 06/28/18  Yes [provider]  levothyroxine (SYNTHROID, LEVOTHROID) 112 MCG tablet Take 112 mcg by mouth daily. 07/15/17  Yes [provider]  lisinopril (PRINIVIL,ZESTRIL) 5 MG tablet Take 5 mg by mouth daily. 11/11/15  Yes [provider]  metoprolol (LOPRESSOR) 50 MG tablet Take 50 mg by mouth 2 (two) times daily. 09/12/15  Yes [provider]  Rivaroxaban (XARELTO) 20 MG TABS Take 20 mg by mouth every evening.    Yes [provider]  zolpidem (AMBIEN) 10 MG tablet Take 10 mg by mouth at bedtime. 06/28/18  Yes [provider]  acetaminophen (TYLENOL) 325 MG tablet Take 2 tablets (650 mg total) by mouth every 6 (six) hours as needed. Do not take more than 4000mg  of tylenol per day Patient not taking: Reported on 07/18/2018 12/26/17   Couture, Cortni S, PA-C  methocarbamol (ROBAXIN) 500 MG tablet Take 1 tablet (500 mg total) by mouth at bedtime and may repeat  dose one time if needed. Patient not taking: Reported on 07/18/2018 03/08/18   Recardo Evangelist, PA-C  methocarbamol (ROBAXIN) 500 MG tablet Take 1 tablet (500 mg total) by mouth at bedtime and may repeat dose one time if needed. Patient not taking: Reported on 07/18/2018 03/08/18   Recardo Evangelist, PA-C    Family History Family History  Problem Relation Age of Onset  . Hypertension Other   . Hyperlipidemia Other     Social History Social History   Tobacco Use  . Smoking status: Never Smoker  . Smokeless tobacco: Never Used  Substance Use Topics  . Alcohol use: Yes    Comment: occasionally  . Drug use: No     Allergies   Penicillins and Pollen extract   Review of Systems Review of Systems  Constitutional: Negative for fever.  HENT: Negative for congestion.   Eyes: Negative for redness.  Respiratory: Negative for shortness of breath.   Cardiovascular: Negative for chest pain.  Gastrointestinal: Positive for abdominal pain, constipation and nausea. Negative for vomiting.  Genitourinary: Negative for dysuria.  Musculoskeletal: Negative for back pain.  Skin: Negative for rash.  Neurological: Negative for headaches.  Psychiatric/Behavioral: Negative for confusion.     Physical Exam Updated Vital Signs BP (!) 143/88   Pulse 62   Temp 97.8 F (36.6 C) (Oral)   Resp 16   Ht 1.549 m (5\' 1" )   Wt 110.2 kg   LMP 06/12/2018 (Approximate)   SpO2 98%   BMI 45.91 kg/m   Physical Exam  Constitutional: She is oriented to person, place, and time. She appears well-developed and well-nourished. No distress.  HENT:  Head: Normocephalic and atraumatic.  Mouth/Throat: Oropharynx is clear and moist.  Eyes: Pupils are equal, round, and reactive to light. Conjunctivae and EOM are normal.  Neck: Normal range of motion. Neck supple.  Cardiovascular: Normal rate, regular rhythm and normal heart sounds.  Pulmonary/Chest: Effort normal and breath sounds normal. No respiratory  distress.  Abdominal: Soft. Bowel sounds are normal. She exhibits mass.  Below the umbilicus a ventral hernia measuring about 4 to 6 cm.  Easily reducible.  Nontender.  Musculoskeletal: Normal range of motion.  Neurological: She is alert and oriented to person, place, and time. No cranial nerve deficit or sensory deficit. She exhibits normal muscle tone. Coordination normal.  Skin: Skin is warm. No rash noted.  Nursing note and vitals reviewed.    ED Treatments / Results  Labs (all labs ordered are listed, but only abnormal results are displayed) Labs Reviewed  CBC - Abnormal; Notable for the following components:      Result Value   WBC 3.9 (*)    RBC 3.78 (*)    Hemoglobin 11.7 (*)    All other components within normal limits  COMPREHENSIVE METABOLIC PANEL - Abnormal; Notable for the following components:   Glucose, Bld  117 (*)    Calcium 8.8 (*)    Total Protein 6.3 (*)    Albumin 3.4 (*)    All other components within normal limits  URINALYSIS, ROUTINE W REFLEX MICROSCOPIC - Abnormal; Notable for the following components:   Ketones, ur 5 (*)    All other components within normal limits  LIPASE, BLOOD  PREGNANCY, URINE    EKG None  Radiology Ct Abdomen Pelvis W Contrast  Result Date: 07/18/2018 CLINICAL DATA:  37 year old female with a history of umbilical pain EXAM: CT ABDOMEN AND PELVIS WITH CONTRAST TECHNIQUE: Multidetector CT imaging of the abdomen and pelvis was performed using the standard protocol following bolus administration of intravenous contrast. CONTRAST:  173mL OMNIPAQUE IOHEXOL 300 MG/ML  SOLN COMPARISON:  07/05/2018, 09/23/2017 FINDINGS: Lower chest: No acute abnormality. Hepatobiliary: Decreased liver attenuation/enhancement. Cholecystectomy. Pancreas: Unremarkable pancreas Spleen: Unremarkable spleen Adrenals/Urinary Tract: Unremarkable adrenal glands. 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 No hydronephrosis or nephrolithiasis.  Unremarkable left kidney with no hydronephrosis or nephrolithiasis. Unremarkable urinary bladder. Stomach/Bowel: Unremarkable stomach. Unremarkable small bowel. No abnormal distention. No transition point. No focal wall thickening. Colon is relatively decompressed.  Normal appendix. Laxity of the lateral left abdominal wall. Ventral wall hernia containing small bowel loops. The neck of the hernia sac is unchanged in size from the comparison. No transition point. No inflammatory changes. Vascular/Lymphatic: No significant vascular calcifications. No aneurysm. Proximal femoral arteries patent. Unremarkable IVC. Reproductive: Unremarkable uterus and adnexa. Other: None Musculoskeletal: No acute displaced fracture. IMPRESSION: Redemonstration of ventral wall hernia at the umbilicus containing loops of small bowel. No evidence of bowel obstruction or significant inflammatory changes. Liver steatosis. Electronically Signed   By: Corrie Mckusick D.O.   On: 07/18/2018 19:56    Procedures Procedures (including critical care time)  Medications Ordered in ED Medications  0.9 %  sodium chloride infusion ( Intravenous New Bag/Given 07/18/18 1647)  ondansetron (ZOFRAN) injection 4 mg (4 mg Intravenous Given 07/18/18 1642)  HYDROmorphone (DILAUDID) injection 0.5 mg (0.5 mg Intravenous Given 07/18/18 1641)  iohexol (OMNIPAQUE) 300 MG/ML solution 100 mL (100 mLs Intravenous Contrast Given 07/18/18 1930)     Initial Impression / Assessment and Plan / ED Course  I have reviewed the triage vital signs and the nursing notes.  Pertinent labs & imaging results that were available during my care of the patient were reviewed by me and considered in my medical decision making (see chart for details).     CT scan abdomen without evidence of bowel obstruction.  There is a ventral hernia no evidence of any incarcerated small bowel no evidence of any small bowel obstruction.  Patient stable.  Abdomen nonacute.  Patient will  return to follow back up with general surgery continue her work-up for the ventral hernia repair.  She is to follow-up with cardiology as per general surgery's request.  Final Clinical Impressions(s) / ED Diagnoses   Final diagnoses:  Periumbilical abdominal pain  Ventral hernia without obstruction or gangrene    ED Discharge Orders    None       Fredia Sorrow, MD 07/18/18 2106

## 2018-07-18 NOTE — ED Notes (Signed)
Patient transported to CT 

## 2018-07-18 NOTE — ED Triage Notes (Signed)
Pt presents with 2 day h/o umbilical pain.  Pt reports having multiple hernias, reports h/o obstruction.  Last bowel movement was last night, +nausea.  Pt reports pain has been constant.

## 2018-07-18 NOTE — ED Notes (Signed)
Patient verbalizes understanding of discharge instructions. Opportunity for questioning and answers were provided. Armband removed by staff, pt discharged from ED ambulatory.   

## 2018-07-18 NOTE — Discharge Instructions (Addendum)
Continue your work-up as per general surgery to get the hernia repaired.  Keep your appointment with cardiology.  Return for any new or worse symptoms.  Return for any persistent vomiting or severe pain.  Today's work-up without any evidence of anything significant stuck in the hernia and also no evidence of any bowel obstruction.

## 2018-07-19 ENCOUNTER — Telehealth: Payer: Self-pay | Admitting: *Deleted

## 2018-07-19 NOTE — Telephone Encounter (Signed)
Pt called regarding being given wrong AVS and Rx.  EDCM advised that we can mail her AVS and she was not prescribed an Rx.

## 2018-08-01 ENCOUNTER — Ambulatory Visit: Payer: Medicaid Other | Admitting: Cardiology

## 2018-08-04 ENCOUNTER — Emergency Department (HOSPITAL_COMMUNITY)
Admission: EM | Admit: 2018-08-04 | Discharge: 2018-08-04 | Disposition: A | Discharge status: Home / Self Care | Payer: Medicaid Other | Attending: Emergency Medicine | Admitting: Emergency Medicine

## 2018-08-04 ENCOUNTER — Emergency Department (HOSPITAL_COMMUNITY): Payer: Medicaid Other

## 2018-08-04 ENCOUNTER — Other Ambulatory Visit: Payer: Self-pay

## 2018-08-04 ENCOUNTER — Encounter (HOSPITAL_COMMUNITY): Payer: Self-pay | Admitting: *Deleted

## 2018-08-04 DIAGNOSIS — S0990XA Unspecified injury of head, initial encounter: Secondary | ICD-10-CM | POA: Diagnosis present

## 2018-08-04 DIAGNOSIS — I10 Essential (primary) hypertension: Secondary | ICD-10-CM | POA: Diagnosis not present

## 2018-08-04 DIAGNOSIS — Y999 Unspecified external cause status: Secondary | ICD-10-CM | POA: Diagnosis not present

## 2018-08-04 DIAGNOSIS — S161XXA Strain of muscle, fascia and tendon at neck level, initial encounter: Secondary | ICD-10-CM | POA: Insufficient documentation

## 2018-08-04 DIAGNOSIS — Y929 Unspecified place or not applicable: Secondary | ICD-10-CM | POA: Insufficient documentation

## 2018-08-04 DIAGNOSIS — Y939 Activity, unspecified: Secondary | ICD-10-CM | POA: Insufficient documentation

## 2018-08-04 DIAGNOSIS — Z79899 Other long term (current) drug therapy: Secondary | ICD-10-CM | POA: Insufficient documentation

## 2018-08-04 DIAGNOSIS — E039 Hypothyroidism, unspecified: Secondary | ICD-10-CM | POA: Insufficient documentation

## 2018-08-04 DIAGNOSIS — W19XXXA Unspecified fall, initial encounter: Secondary | ICD-10-CM | POA: Insufficient documentation

## 2018-08-04 NOTE — ED Triage Notes (Signed)
The pt reports that she fell down some steps yesterday at 1500 and struck the back of her head.  C/o lt sided head pain  She takes xarelto for pes in the past.  Alert no distress

## 2018-08-04 NOTE — ED Notes (Signed)
To ct

## 2018-08-04 NOTE — ED Provider Notes (Signed)
[facility] EMERGENCY DEPARTMENT Provider Note   CSN: [id number] Arrival date & time: 08/04/18  0130     History   Chief Complaint Chief Complaint  Patient presents with  . Fall    HPI [name] is a 37 y.o. female.  Patient presents to the emergency department with a chief complaint of headache.  She states that she slipped and fell earlier today (around 3 PM).  She struck the back of her head.  She is anticoagulated on Xarelto.  She states that she felt okay initially, but then around 7:00 PM she began developing a headache.  She states that headache is gradually worsened.  She also complains of some pain on the left side of her neck.  She denies any speech changes, vision changes, numbness, weakness, tingling, vomiting, seizure.  She has not taken anything for symptoms.  The history is provided by the patient. No language interpreter was used.    Past Medical History:  Diagnosis Date  . Complication of anesthesia    "difficulty waking up and will fight when woken up"  . Gallstones   . Hernia, ventral   . History of blood clots   . History of pulmonary embolus (PE)   . Hypertension   . Hyperthyroidism    01/15/17- in the past  . Shortness of breath dyspnea     Patient Active Problem List   Diagnosis Date Noted  . Left upper quadrant pain 03/03/2017  . Generalized abdominal wall pain 03/03/2017  . Obesity, Class III, BMI 40-49.9 (morbid obesity) ([place]) 03/03/2017  . Small bowel obstruction due to adhesions ([place]) 02/24/2017  . Essential hypertension 02/24/2017  . History of pulmonary embolism 02/24/2017  . Hypothyroidism 02/24/2017  . S/P laparoscopic cholecystectomy 01/18/2017  . Post op infection 05/03/2016  . Incisional hernia 04/13/2016    Past Surgical History:  Procedure Laterality Date  . BREAST SURGERY     cyst removed from right  . CESAREAN SECTION    . CHOLECYSTECTOMY  01/18/2017  . CHOLECYSTECTOMY N/A 01/18/2017   Procedure:  LAPAROSCOPIC CHOLECYSTECTOMY;  Surgeon: Coralie Keens, MD;  Location: [facility];  Service: General;  Laterality: N/A;  . INCISIONAL HERNIA REPAIR  04/13/2016  . INCISIONAL HERNIA REPAIR N/A 04/13/2016   Procedure: INCISIONAL HERNIA REPAIR;  Surgeon: Coralie Keens, MD;  Location: [facility];  Service: General;  Laterality: N/A;  . INSERTION OF MESH N/A 04/13/2016   Procedure: INSERTION OF MESH;  Surgeon: Coralie Keens, MD;  Location: [facility];  Service: General;  Laterality: N/A;  . LAPAROTOMY N/A 03/05/2017   Procedure: EXPLORATORY LAPAROTOMY;  Surgeon: Rolm Bookbinder, MD;  Location: [facility];  Service: General;  Laterality: N/A;  . LYSIS OF ADHESION N/A 03/05/2017   Procedure: LYSIS OF ADHESIONs;  Surgeon: Rolm Bookbinder, MD;  Location: [facility];  Service: General;  Laterality: N/A;  . PILONIDAL CYST / SINUS EXCISION    . removal of sweat gland Bilateral    arms     OB History   None      Home Medications    Prior to Admission medications   Medication Sig Start Date End Date Taking? Authorizing Provider  acetaminophen (TYLENOL) 325 MG tablet Take 2 tablets (650 mg total) by mouth every 6 (six) hours as needed. Do not take more than 4000mg  of tylenol per day Patient not taking: Reported on 07/18/2018 12/26/17   Couture, Cortni S, PA-C  divalproex (DEPAKOTE) 500 MG DR tablet Take 1,500 mg by mouth at bedtime.  06/28/18   [provider]  DULoxetine (CYMBALTA) 30 MG capsule Take 30 mg by mouth 2 (two) times daily. 06/28/18   [provider]  levothyroxine (SYNTHROID, LEVOTHROID) 112 MCG tablet Take 112 mcg by mouth daily. 07/15/17   [provider]  lisinopril (PRINIVIL,ZESTRIL) 5 MG tablet Take 5 mg by mouth daily. 11/11/15   [provider]  methocarbamol (ROBAXIN) 500 MG tablet Take 1 tablet (500 mg total) by mouth at bedtime and may repeat dose one time if needed. Patient not taking: Reported on 07/18/2018 03/08/18   Recardo Evangelist, PA-C  methocarbamol  (ROBAXIN) 500 MG tablet Take 1 tablet (500 mg total) by mouth at bedtime and may repeat dose one time if needed. Patient not taking: Reported on 07/18/2018 03/08/18   Recardo Evangelist, PA-C  metoprolol (LOPRESSOR) 50 MG tablet Take 50 mg by mouth 2 (two) times daily. 09/12/15   [provider]  Rivaroxaban (XARELTO) 20 MG TABS Take 20 mg by mouth every evening.     [provider]  zolpidem (AMBIEN) 10 MG tablet Take 10 mg by mouth at bedtime. 06/28/18   [provider]    Family History Family History  Problem Relation Age of Onset  . Hypertension Other   . Hyperlipidemia Other     Social History Social History   Tobacco Use  . Smoking status: Never Smoker  . Smokeless tobacco: Never Used  Substance Use Topics  . Alcohol use: Yes    Comment: occasionally  . Drug use: No     Allergies   Penicillins and Pollen extract   Review of Systems Review of Systems  All other systems reviewed and are negative.    Physical Exam Updated Vital Signs BP (!) 175/91 (BP Location: Right Arm)   Pulse 77   Temp 99.6 F (37.6 C) (Oral)   Resp 18   Ht 5\' 1"  (1.549 m)   Wt 108.9 kg   SpO2 100%   BMI 45.35 kg/m   Physical Exam  Constitutional: She is oriented to person, place, and time. She appears well-developed and well-nourished.  HENT:  Head: Normocephalic and atraumatic.  Right Ear: External ear normal.  Left Ear: External ear normal.  Eyes: Pupils are equal, round, and reactive to light. Conjunctivae and EOM are normal.  Neck: Normal range of motion. Neck supple.  No pain with neck flexion, no meningismus  Cardiovascular: Normal rate, regular rhythm and normal heart sounds. Exam reveals no gallop and no friction rub.  No murmur heard. Pulmonary/Chest: Effort normal and breath sounds normal. No respiratory distress. She has no wheezes. She has no rales. She exhibits no tenderness.  Abdominal: Soft. Bowel sounds are normal. She exhibits no distension  and no mass. There is no tenderness. There is no rebound and no guarding.  Musculoskeletal: Normal range of motion. She exhibits no edema or tenderness.  Normal gait.  Neurological: She is alert and oriented to person, place, and time. She has normal reflexes.  CN 3-12 intact, normal finger to nose, no pronator drift, sensation and strength intact bilaterally.  Skin: Skin is warm and dry.  Psychiatric: She has a normal mood and affect. Her behavior is normal. Judgment and thought content normal.  Nursing note and vitals reviewed.    ED Treatments / Results  Labs (all labs ordered are listed, but only abnormal results are displayed) Labs Reviewed - No data to display  EKG None  Radiology Ct Head Wo Contrast  Result Date: 08/04/2018  CLINICAL DATA:  [place] down steps yesterday. Blunt trauma. Headache and neck pain. Initial encounter. EXAM: CT HEAD WITHOUT CONTRAST CT CERVICAL SPINE WITHOUT CONTRAST TECHNIQUE: Multidetector CT imaging of the head and cervical spine was performed following the standard protocol without intravenous contrast. Multiplanar CT image reconstructions of the cervical spine were also generated. COMPARISON:  Head CT on 12/19/2014 FINDINGS: CT HEAD FINDINGS Brain: No evidence of acute infarction, hemorrhage, hydrocephalus, extra-axial collection, or mass lesion/mass effect. Vascular:  No hyperdense vessel or other acute findings. Skull: No evidence of fracture or other significant bone abnormality. Sinuses/Orbits:  No acute findings. Other: None. CT CERVICAL SPINE FINDINGS Alignment: Normal. Skull base and vertebrae: No acute fracture. No primary bone lesion or focal pathologic process. Soft tissues and spinal canal: No prevertebral fluid or swelling. No visible canal hematoma. Disc levels: No disc space narrowing. Upper chest: No acute findings. Other: None. IMPRESSION: Negative noncontrast head CT. Negative cervical spine CT. Electronically Signed   By: Earle Gell M.D.   On:  08/04/2018 02:56   Ct Cervical Spine Wo Contrast  Result Date: 08/04/2018 CLINICAL DATA:  [place] down steps yesterday. Blunt trauma. Headache and neck pain. Initial encounter. EXAM: CT HEAD WITHOUT CONTRAST CT CERVICAL SPINE WITHOUT CONTRAST TECHNIQUE: Multidetector CT imaging of the head and cervical spine was performed following the standard protocol without intravenous contrast. Multiplanar CT image reconstructions of the cervical spine were also generated. COMPARISON:  Head CT on 12/19/2014 FINDINGS: CT HEAD FINDINGS Brain: No evidence of acute infarction, hemorrhage, hydrocephalus, extra-axial collection, or mass lesion/mass effect. Vascular:  No hyperdense vessel or other acute findings. Skull: No evidence of fracture or other significant bone abnormality. Sinuses/Orbits:  No acute findings. Other: None. CT CERVICAL SPINE FINDINGS Alignment: Normal. Skull base and vertebrae: No acute fracture. No primary bone lesion or focal pathologic process. Soft tissues and spinal canal: No prevertebral fluid or swelling. No visible canal hematoma. Disc levels: No disc space narrowing. Upper chest: No acute findings. Other: None. IMPRESSION: Negative noncontrast head CT. Negative cervical spine CT. Electronically Signed   By: Earle Gell M.D.   On: 08/04/2018 02:56    Procedures Procedures (including critical care time)  Medications Ordered in ED Medications - No data to display   Initial Impression / Assessment and Plan / ED Course  I have reviewed the triage vital signs and the nursing notes.  Pertinent labs & imaging results that were available during my care of the patient were reviewed by me and considered in my medical decision making (see chart for details).     Patient with mechanical fall.  She is anticoagulated on Xarelto.  Had worsening headache tonight.  CT imaging negative.  Recommend discharge with close follow-up or return for new or worsening symptoms.  Final Clinical Impressions(s) /  ED Diagnoses   Final diagnoses:  Fall, initial encounter  Injury of head, initial encounter  Strain of neck muscle, initial encounter    ED Discharge Orders    None       Montine Circle, PA-C 08/04/18 [place], Ankit, MD 08/04/18 [phone]

## 2018-08-04 NOTE — Discharge Instructions (Addendum)
Return for vision changes, slurred speech, numbness, weakness, or tingling.

## 2018-08-09 ENCOUNTER — Encounter: Payer: Self-pay | Admitting: Cardiology

## 2018-08-09 ENCOUNTER — Ambulatory Visit (INDEPENDENT_AMBULATORY_CARE_PROVIDER_SITE_OTHER): Payer: Medicaid Other | Admitting: Cardiology

## 2018-08-09 VITALS — BP 128/76 | HR 70 | Ht 61.0 in | Wt 245.0 lb

## 2018-08-09 DIAGNOSIS — Z01810 Encounter for preprocedural cardiovascular examination: Secondary | ICD-10-CM | POA: Insufficient documentation

## 2018-08-09 DIAGNOSIS — Z86711 Personal history of pulmonary embolism: Secondary | ICD-10-CM | POA: Diagnosis not present

## 2018-08-09 DIAGNOSIS — I10 Essential (primary) hypertension: Secondary | ICD-10-CM | POA: Diagnosis not present

## 2018-08-09 NOTE — Patient Instructions (Signed)
Medication Instructions:  Your physician recommends that you continue on your current medications as directed. Please refer to the Current Medication list given to you today.  If you need a refill on your cardiac medications before your next appointment, please call your pharmacy.   Lab work: None  If you have labs (blood work) drawn today and your tests are completely normal, you will receive your results only by: Marland Kitchen MyChart Message (if you have MyChart) OR . A paper copy in the mail If you have any lab test that is abnormal or we need to change your treatment, we will call you to review the results.  Testing/Procedures: Your physician has requested that you have an echocardiogram. Echocardiography is a painless test that uses sound waves to create images of your heart. It provides your doctor with information about the size and shape of your heart and how well your heart's chambers and valves are working. This procedure takes approximately one hour. There are no restrictions for this procedure.  Your physician has requested that you have a lexiscan myoview. For further information please visit [email or web address]. Please follow instruction sheet, as given.  Follow-Up: At [facility], you and your health needs are our priority.  As part of our continuing mission to provide you with exceptional heart care, we have created designated Provider Care Teams.  These Care Teams include your primary Cardiologist (physician) and Advanced Practice Providers (APPs -  Physician Assistants and Nurse Practitioners) who all work together to provide you with the care you need, when you need it.  You will need a follow up appointment in as needed.  Please call our office 2 months in advance to schedule this appointment.  You may see another member of our [employer] Provider Team in [place]: Jenne Campus, MD . Shirlee More, MD  Any Other Special Instructions Will Be Listed Below (If  Applicable).

## 2018-08-09 NOTE — Progress Notes (Deleted)
Cardiology Office Note:    Date:  08/09/2018   ID:  [name], DOB January 02, 1981, MRN [id number]  PCP:  Berkley Harvey, NP  Cardiologist:  Jenean Lindau, MD   Referring MD: Berkley Harvey, NP    ASSESSMENT:    1. Pre-operative cardiovascular examination   2. Obesity, Class III, BMI 40-49.9 (morbid obesity) ([place])   3. History of pulmonary embolism   4. Essential hypertension    PLAN:    In order of problems listed above:  1. ***   Medication Adjustments/Labs and Tests Ordered: Current medicines are reviewed at length with the patient today.  Concerns regarding medicines are outlined above.  No orders of the defined types were placed in this encounter.  No orders of the defined types were placed in this encounter.    History of Present Illness:    [name] is a 37 y.o. female who is being seen today for the evaluation of *** at the request of Berkley Harvey, NP. ***  Past Medical History:  Diagnosis Date  . Complication of anesthesia    "difficulty waking up and will fight when woken up"  . Gallstones   . Hernia, ventral   . History of blood clots   . History of pulmonary embolus (PE)   . Hypertension   . Hyperthyroidism    01/15/17- in the past  . Shortness of breath dyspnea     Past Surgical History:  Procedure Laterality Date  . BREAST SURGERY     cyst removed from right  . CESAREAN SECTION    . CHOLECYSTECTOMY  01/18/2017  . CHOLECYSTECTOMY N/A 01/18/2017   Procedure: LAPAROSCOPIC CHOLECYSTECTOMY;  Surgeon: Coralie Keens, MD;  Location: [facility];  Service: General;  Laterality: N/A;  . INCISIONAL HERNIA REPAIR  04/13/2016  . INCISIONAL HERNIA REPAIR N/A 04/13/2016   Procedure: INCISIONAL HERNIA REPAIR;  Surgeon: Coralie Keens, MD;  Location: [place];  Service: General;  Laterality: N/A;  . INSERTION OF MESH N/A 04/13/2016   Procedure: INSERTION OF MESH;  Surgeon: Coralie Keens, MD;  Location: [facility];  Service: General;  Laterality: N/A;  .  LAPAROTOMY N/A 03/05/2017   Procedure: EXPLORATORY LAPAROTOMY;  Surgeon: Rolm Bookbinder, MD;  Location: [facility];  Service: General;  Laterality: N/A;  . LYSIS OF ADHESION N/A 03/05/2017   Procedure: LYSIS OF ADHESIONs;  Surgeon: Rolm Bookbinder, MD;  Location: [place];  Service: General;  Laterality: N/A;  . PILONIDAL CYST / SINUS EXCISION    . removal of sweat gland Bilateral    arms    Current Medications: Current Meds  Medication Sig  . divalproex (DEPAKOTE) 500 MG DR tablet Take 1,500 mg by mouth at bedtime.  . DULoxetine (CYMBALTA) 30 MG capsule Take 30 mg by mouth 2 (two) times daily.  Marland Kitchen levothyroxine (SYNTHROID, LEVOTHROID) 112 MCG tablet Take 112 mcg by mouth daily.  Marland Kitchen lisinopril (PRINIVIL,ZESTRIL) 5 MG tablet Take 5 mg by mouth daily.  . metoprolol (LOPRESSOR) 50 MG tablet Take 50 mg by mouth 2 (two) times daily.  . Rivaroxaban (XARELTO) 20 MG TABS Take 20 mg by mouth every evening.   . zolpidem (AMBIEN) 10 MG tablet Take 10 mg by mouth at bedtime.     Allergies:   Penicillins and Pollen extract   Social History   Socioeconomic History  . Marital status: Married    Spouse name: Not on file  . Number of children: Not on file  . Years of education: Not  on file  . Highest education level: Not on file  Occupational History  . Not on file  Social Needs  . Financial resource strain: Not on file  . Food insecurity:    Worry: Not on file    Inability: Not on file  . Transportation needs:    Medical: Not on file    Non-medical: Not on file  Tobacco Use  . Smoking status: Never Smoker  . Smokeless tobacco: Never Used  Substance and Sexual Activity  . Alcohol use: Yes    Comment: occasionally  . Drug use: No  . Sexual activity: Not on file  Lifestyle  . Physical activity:    Days per week: Not on file    Minutes per session: Not on file  . Stress: Not on file  Relationships  . Social connections:    Talks on phone: Not on file    Gets together: Not on file     Attends religious service: Not on file    Active member of club or organization: Not on file    Attends meetings of clubs or organizations: Not on file    Relationship status: Not on file  Other Topics Concern  . Not on file  Social History Narrative  . Not on file     Family History: The patient's family history includes Hyperlipidemia in her other; Hypertension in her other.  ROS:   Please see the history of present illness.    All other systems reviewed and are negative.  EKGs/Labs/Other Studies Reviewed:    The following studies were reviewed today: ***   Recent Labs: 07/18/2018: ALT 12; BUN 9; Creatinine, Ser 0.99; Hemoglobin 11.7; Platelets 234; Potassium 3.7; Sodium 138  Recent Lipid Panel No results found for: CHOL, TRIG, HDL, CHOLHDL, VLDL, LDLCALC, LDLDIRECT  Physical Exam:    VS:  BP 128/76 (BP Location: Right Arm, Patient Position: Sitting, Cuff Size: Normal)   Pulse 70   Ht 5\' 1"  (1.549 m)   Wt 245 lb (111.1 kg)   SpO2 96%   BMI 46.29 kg/m     Wt Readings from Last 3 Encounters:  08/09/18 245 lb (111.1 kg)  08/04/18 240 lb (108.9 kg)  07/18/18 243 lb (110.2 kg)     GEN: Patient is in no acute distress HEENT: Normal NECK: No JVD; No carotid bruits LYMPHATICS: No lymphadenopathy CARDIAC: S1 S2 regular, 2/6 systolic murmur at the apex. RESPIRATORY:  Clear to auscultation without rales, wheezing or rhonchi  ABDOMEN: Soft, non-tender, non-distended MUSCULOSKELETAL:  No edema; No deformity  SKIN: Warm and dry NEUROLOGIC:  Alert and oriented x 3 PSYCHIATRIC:  Normal affect    Signed, Jenean Lindau, MD  08/09/2018 3:10 PM    [facility]

## 2018-08-09 NOTE — Progress Notes (Signed)
Cardiology Office Note:    Date:  08/09/2018   ID:  [name], DOB 07-Jul-1981, MRN [id number]  PCP:  Berkley Harvey, NP  Cardiologist:  Jenean Lindau, MD   Referring MD: Berkley Harvey, NP    ASSESSMENT:    1. Pre-operative cardiovascular examination   2. Obesity, Class III, BMI 40-49.9 (morbid obesity) ([place])   3. History of pulmonary embolism   4. Essential hypertension    PLAN:    In order of problems listed above:  1. Primary prevention stressed with the patient.  Importance of compliance with diet and medication stressed and she vocalized understanding.  Diet was discussed for obesity and risks of obesity explained and she plans to do better. 2. Her blood pressure is stable. 3. In view of multiple risk factors she will undergo Lexiscan sestamibi.  She tells me that she has had a tubal ligations and that she is not at risk for pregnancy.  Echocardiogram will be done to assess murmur heard on auscultation. 4. If the aforementioned tests are negative then she is not at high risk for coronary events during the aforementioned surgery.  Meticulous hemodynamic monitoring will further reduce the risk of coronary events. 5. That she has history of multiple pulmonary emboli, she should be off anticoagulation for a minimal period of time.  My recommendation would be to withhold Xarelto if possible for a day before the surgery.  If there needs to be any more withholding then she should be considered for Lovenox bridge therapy.  Also her anticoagulation should be initiated as soon as possible as felt appropriate by her surgeon.  I discussed these issues with her at length.  Total time for this evaluation was 35 minutes. 6. She will be seen in follow-up appointment on a as needed basis only.   Medication Adjustments/Labs and Tests Ordered: Current medicines are reviewed at length with the patient today.  Concerns regarding medicines are outlined above.  No orders of the defined types  were placed in this encounter.  No orders of the defined types were placed in this encounter.    History of Present Illness:    [name] is a 37 y.o. female who is being seen today for the evaluation of preop cardiovascular assessment at the request of Berkley Harvey, NP.  Patient is a pleasant 37 year old female.  She has past medical history of essential hypertension, morbid obesity and abdominal surgeries.  She has had hernia and this is needing of surgical fix and so her surgeon sent here for an evaluation for preoperative purposes.  She has had history of multiple pulmonary emboli past and her last was 13 years ago according to the patient.  Subsequently she is always been on a blood thinner.  At the time of my evaluation, the patient is alert awake oriented and in no distress.  Past Medical History:  Diagnosis Date  . Complication of anesthesia    "difficulty waking up and will fight when woken up"  . Gallstones   . Hernia, ventral   . History of blood clots   . History of pulmonary embolus (PE)   . Hypertension   . Hyperthyroidism    01/15/17- in the past  . Shortness of breath dyspnea     Past Surgical History:  Procedure Laterality Date  . BREAST SURGERY     cyst removed from right  . CESAREAN SECTION    . CHOLECYSTECTOMY  01/18/2017  . CHOLECYSTECTOMY N/A 01/18/2017  Procedure: LAPAROSCOPIC CHOLECYSTECTOMY;  Surgeon: Coralie Keens, MD;  Location: [facility];  Service: General;  Laterality: N/A;  . INCISIONAL HERNIA REPAIR  04/13/2016  . INCISIONAL HERNIA REPAIR N/A 04/13/2016   Procedure: INCISIONAL HERNIA REPAIR;  Surgeon: Coralie Keens, MD;  Location: [place];  Service: General;  Laterality: N/A;  . INSERTION OF MESH N/A 04/13/2016   Procedure: INSERTION OF MESH;  Surgeon: Coralie Keens, MD;  Location: [place];  Service: General;  Laterality: N/A;  . LAPAROTOMY N/A 03/05/2017   Procedure: EXPLORATORY LAPAROTOMY;  Surgeon: Rolm Bookbinder, MD;  Location: [place];   Service: General;  Laterality: N/A;  . LYSIS OF ADHESION N/A 03/05/2017   Procedure: LYSIS OF ADHESIONs;  Surgeon: Rolm Bookbinder, MD;  Location: [place];  Service: General;  Laterality: N/A;  . PILONIDAL CYST / SINUS EXCISION    . removal of sweat gland Bilateral    arms    Current Medications: Current Meds  Medication Sig  . divalproex (DEPAKOTE) 500 MG DR tablet Take 1,500 mg by mouth at bedtime.  . DULoxetine (CYMBALTA) 30 MG capsule Take 30 mg by mouth 2 (two) times daily.  Marland Kitchen levothyroxine (SYNTHROID, LEVOTHROID) 112 MCG tablet Take 112 mcg by mouth daily.  Marland Kitchen lisinopril (PRINIVIL,ZESTRIL) 5 MG tablet Take 5 mg by mouth daily.  . metoprolol (LOPRESSOR) 50 MG tablet Take 50 mg by mouth 2 (two) times daily.  . Rivaroxaban (XARELTO) 20 MG TABS Take 20 mg by mouth every evening.   . zolpidem (AMBIEN) 10 MG tablet Take 10 mg by mouth at bedtime.     Allergies:   Penicillins and Pollen extract   Social History   Socioeconomic History  . Marital status: Married    Spouse name: Not on file  . Number of children: Not on file  . Years of education: Not on file  . Highest education level: Not on file  Occupational History  . Not on file  Social Needs  . Financial resource strain: Not on file  . Food insecurity:    Worry: Not on file    Inability: Not on file  . Transportation needs:    Medical: Not on file    Non-medical: Not on file  Tobacco Use  . Smoking status: Never Smoker  . Smokeless tobacco: Never Used  Substance and Sexual Activity  . Alcohol use: Yes    Comment: occasionally  . Drug use: No  . Sexual activity: Not on file  Lifestyle  . Physical activity:    Days per week: Not on file    Minutes per session: Not on file  . Stress: Not on file  Relationships  . Social connections:    Talks on phone: Not on file    Gets together: Not on file    Attends religious service: Not on file    Active member of club or organization: Not on file    Attends meetings  of clubs or organizations: Not on file    Relationship status: Not on file  Other Topics Concern  . Not on file  Social History Narrative  . Not on file     Family History: The patient's family history includes Hyperlipidemia in her other; Hypertension in her other.  ROS:   Please see the history of present illness.    All other systems reviewed and are negative.  EKGs/Labs/Other Studies Reviewed:    The following studies were reviewed today: I discussed my findings with the patient at extensive length.  EKG reveals  sinus rhythm and nonspecific ST-T changes   Recent Labs: 07/18/2018: ALT 12; BUN 9; Creatinine, Ser 0.99; Hemoglobin 11.7; Platelets 234; Potassium 3.7; Sodium 138  Recent Lipid Panel No results found for: CHOL, TRIG, HDL, CHOLHDL, VLDL, LDLCALC, LDLDIRECT  Physical Exam:    VS:  BP 128/76 (BP Location: Right Arm, Patient Position: Sitting, Cuff Size: Normal)   Pulse 70   Ht 5\' 1"  (1.549 m)   Wt 245 lb (111.1 kg)   SpO2 96%   BMI 46.29 kg/m     Wt Readings from Last 3 Encounters:  08/09/18 245 lb (111.1 kg)  08/04/18 240 lb (108.9 kg)  07/18/18 243 lb (110.2 kg)     GEN: Patient is in no acute distress HEENT: Normal NECK: No JVD; No carotid bruits LYMPHATICS: No lymphadenopathy CARDIAC: S1 S2 regular, 2/6 systolic murmur at the apex. RESPIRATORY:  Clear to auscultation without rales, wheezing or rhonchi  ABDOMEN: Soft, non-tender, non-distended.  Morbidly obese and evidence of hernia. MUSCULOSKELETAL:  No edema; No deformity  SKIN: Warm and dry NEUROLOGIC:  Alert and oriented x 3 PSYCHIATRIC:  Normal affect    Signed, Jenean Lindau, MD  08/09/2018 3:11 PM    [facility]

## 2018-08-10 ENCOUNTER — Ambulatory Visit (HOSPITAL_BASED_OUTPATIENT_CLINIC_OR_DEPARTMENT_OTHER)
Admission: RE | Admit: 2018-08-10 | Discharge: 2018-08-10 | Disposition: A | Discharge status: Home / Self Care | Payer: Medicaid Other | Source: Ambulatory Visit | Attending: Cardiology | Admitting: Cardiology

## 2018-08-10 DIAGNOSIS — Z01810 Encounter for preprocedural cardiovascular examination: Secondary | ICD-10-CM | POA: Diagnosis present

## 2018-08-10 NOTE — Progress Notes (Signed)
  Echocardiogram 2D Echocardiogram has been performed.  Taimur Fier T Sheyli Horwitz 08/10/2018, 3:54 PM

## 2018-08-15 ENCOUNTER — Ambulatory Visit: Payer: Medicaid Other | Admitting: Cardiology

## 2018-08-16 ENCOUNTER — Telehealth (HOSPITAL_COMMUNITY): Payer: Self-pay

## 2018-08-16 NOTE — Telephone Encounter (Signed)
Encounter complete. 

## 2018-08-17 ENCOUNTER — Telehealth (HOSPITAL_COMMUNITY): Payer: Self-pay | Admitting: Radiology

## 2018-08-17 NOTE — Telephone Encounter (Signed)
Patient given detailed instructions per Myocardial Perfusion Study Information Sheet for the test on 08/18/2018 at 1pm. Patient notified to arrive 15 minutes early and that it is imperative to arrive on time for appointment to keep from having the test rescheduled.  If you need to cancel or reschedule your appointment, please call the office within 24 hours of your appointment. . Patient verbalized understanding.EHK

## 2018-08-18 ENCOUNTER — Ambulatory Visit (HOSPITAL_COMMUNITY)
Admission: RE | Admit: 2018-08-18 | Discharge: 2018-08-18 | Disposition: A | Discharge status: Home / Self Care | Payer: Medicaid Other | Source: Ambulatory Visit | Attending: Cardiology | Admitting: Cardiology

## 2018-08-18 DIAGNOSIS — Z01810 Encounter for preprocedural cardiovascular examination: Secondary | ICD-10-CM | POA: Insufficient documentation

## 2018-08-18 MED ORDER — TECHNETIUM TC 99M TETROFOSMIN IV KIT
31.5000 | PACK | Freq: Once | INTRAVENOUS | Status: AC | PRN
  Administered 2018-08-18: 31.5 via INTRAVENOUS
  Filled 2018-08-18: qty 32

## 2018-08-18 MED ORDER — REGADENOSON 0.4 MG/5ML IV SOLN
0.4000 mg | Freq: Once | INTRAVENOUS | Status: AC
  Administered 2018-08-18: 0.4 mg via INTRAVENOUS

## 2018-08-19 ENCOUNTER — Ambulatory Visit (HOSPITAL_COMMUNITY)
Admission: RE | Admit: 2018-08-19 | Discharge: 2018-08-19 | Disposition: A | Discharge status: Home / Self Care | Payer: Medicaid Other | Source: Ambulatory Visit | Attending: Cardiology | Admitting: Cardiology

## 2018-08-19 LAB — MYOCARDIAL PERFUSION IMAGING
CHL CUP NUCLEAR SRS: 5
LV dias vol: 82 mL (ref 46–106)
LV sys vol: 27 mL
Peak HR: 100 {beats}/min
Rest HR: 77 {beats}/min
SDS: 9
SSS: 13
TID: 0.88

## 2018-08-19 MED ORDER — TECHNETIUM TC 99M TETROFOSMIN IV KIT: 30.9000 | PACK | Freq: Once | INTRAVENOUS | Status: AC | PRN

## 2018-08-22 ENCOUNTER — Telehealth: Payer: Self-pay

## 2018-08-22 NOTE — Telephone Encounter (Signed)
Patient called and notified of test results. 

## 2018-08-22 NOTE — Telephone Encounter (Signed)
-----   Message from Jenean Lindau, MD sent at 08/22/2018  8:19 AM EST ----- The results of the study is unremarkable. Please inform patient. I will discuss in detail at next appointment. Cc  primary care/referring physician Jenean Lindau, MD 08/22/2018 8:19 AM

## 2018-09-09 NOTE — Pre-Procedure Instructions (Addendum)
[name]  09/09/2018      [facility] [place], [name] [address] Phone: [phone] Fax: [phone]    Your procedure is scheduled on 09/14/2018.  Report to [facility] Admitting at 1010 A.M.  Call this number if you have problems the morning of surgery:  [phone]   Remember:  Do not eat after midnight the night before your surgery.  You may drink clear liquids until 9:10am the morning of your surgery .  Clear liquids allowed are: Water, Juice (non-citric and without pulp), Carbonated beverages, Clear Tea, Black Coffee only and Gatorade    Take these medicines the morning of surgery with A SIP OF WATER: Duloxetine (Cymbalta) Levothyroxine (Synthroid) Metoprolol (Lopressor)  7 days prior to surgery STOP taking any Aspirin (unless otherwise instructed by your surgeon), Aleve, Naproxen, Ibuprofen, Motrin, Advil, Goody's, BC's, all herbal medications, fish oil, and all vitamins.  Follow your surgeon's instructions on when to stop Xarelto.  If no instructions were given by your surgeon then you will need to call the office to get those instructions.      Do not wear jewelry, make-up or nail polish.  Do not wear lotions, powders, or perfumes, or deodorant.  Do not shave 48 hours prior to surgery.   Do not bring valuables to the hospital.  [facility] is not responsible for any belongings or valuables.  Contacts, eyeglasses, hearing aids, dentures or bridgework may not be worn into surgery.  Leave your suitcase in the car.  After surgery it may be brought to your room.  For patients admitted to the hospital, discharge time will be determined by your treatment team.  Patients discharged the day of surgery will not be allowed to drive home.   Name and phone number of your driver:    Special instructions:   [facility]- Preparing For  Surgery  Before surgery, you can play an important role. Because skin is not sterile, your skin needs to be as free of germs as possible. You can reduce the number of germs on your skin by washing with CHG (chlorahexidine gluconate) Soap before surgery.  CHG is an antiseptic cleaner which kills germs and bonds with the skin to continue killing germs even after washing.    Oral Hygiene is also important to reduce your risk of infection.  Remember - BRUSH YOUR TEETH THE MORNING OF SURGERY WITH YOUR REGULAR TOOTHPASTE  Please do not use if you have an allergy to CHG or antibacterial soaps. If your skin becomes reddened/irritated stop using the CHG.  Do not shave (including legs and underarms) for at least 48 hours prior to first CHG shower. It is OK to shave your face.  Please follow these instructions carefully.   1. Shower the NIGHT BEFORE SURGERY and the MORNING OF SURGERY with CHG.   2. If you chose to wash your hair, wash your hair first as usual with your normal shampoo.  3. After you shampoo, rinse your hair and body thoroughly to remove the shampoo.  4. Use CHG as you would any other liquid soap. You can apply CHG directly to the skin and wash gently with a scrungie or a clean washcloth.   5. Apply the CHG Soap to your body ONLY FROM THE NECK DOWN.  Do not use on open wounds or open sores. Avoid contact with your eyes,  ears, mouth and genitals (private parts). Wash Face and genitals (private parts)  with your normal soap.  6. Wash thoroughly, paying special attention to the area where your surgery will be performed.  7. Thoroughly rinse your body with warm water from the neck down.  8. DO NOT shower/wash with your normal soap after using and rinsing off the CHG Soap.  9. Pat yourself dry with a CLEAN TOWEL.  10. Wear CLEAN PAJAMAS to bed the night before surgery, wear comfortable clothes the morning of surgery  11. Place CLEAN SHEETS on your bed the night of your first shower and  DO NOT SLEEP WITH PETS.    Day of Surgery: Shower as stated above. Do not apply any deodorants/lotions.  Please wear clean clothes to the hospital/surgery center.   Remember to brush your teeth WITH YOUR REGULAR TOOTHPASTE.    Please read over the following fact sheets that you were given.

## 2018-09-12 ENCOUNTER — Encounter (HOSPITAL_COMMUNITY): Payer: Self-pay

## 2018-09-12 ENCOUNTER — Other Ambulatory Visit: Payer: Self-pay

## 2018-09-12 ENCOUNTER — Encounter (HOSPITAL_COMMUNITY)
Admission: RE | Admit: 2018-09-12 | Discharge: 2018-09-12 | Disposition: A | Discharge status: Home / Self Care | Payer: Medicaid Other | Source: Ambulatory Visit | Attending: Surgery | Admitting: Surgery

## 2018-09-12 DIAGNOSIS — Z79890 Hormone replacement therapy (postmenopausal): Secondary | ICD-10-CM | POA: Insufficient documentation

## 2018-09-12 DIAGNOSIS — Z7901 Long term (current) use of anticoagulants: Secondary | ICD-10-CM | POA: Diagnosis not present

## 2018-09-12 DIAGNOSIS — Z86711 Personal history of pulmonary embolism: Secondary | ICD-10-CM | POA: Diagnosis not present

## 2018-09-12 DIAGNOSIS — E0590 Thyrotoxicosis, unspecified without thyrotoxic crisis or storm: Secondary | ICD-10-CM | POA: Insufficient documentation

## 2018-09-12 DIAGNOSIS — Z01818 Encounter for other preprocedural examination: Secondary | ICD-10-CM | POA: Insufficient documentation

## 2018-09-12 DIAGNOSIS — K432 Incisional hernia without obstruction or gangrene: Secondary | ICD-10-CM | POA: Insufficient documentation

## 2018-09-12 DIAGNOSIS — F329 Major depressive disorder, single episode, unspecified: Secondary | ICD-10-CM | POA: Diagnosis not present

## 2018-09-12 DIAGNOSIS — Z79899 Other long term (current) drug therapy: Secondary | ICD-10-CM | POA: Diagnosis not present

## 2018-09-12 DIAGNOSIS — I10 Essential (primary) hypertension: Secondary | ICD-10-CM | POA: Insufficient documentation

## 2018-09-12 HISTORY — DX: Depression, unspecified: F32.A

## 2018-09-12 HISTORY — DX: Major depressive disorder, single episode, unspecified: F32.9

## 2018-09-12 LAB — BASIC METABOLIC PANEL
ANION GAP: 6 (ref 5–15)
BUN: 8 mg/dL (ref 6–20)
CALCIUM: 8.8 mg/dL — AB (ref 8.9–10.3)
CO2: 25 mmol/L (ref 22–32)
Chloride: 106 mmol/L (ref 98–111)
Creatinine, Ser: 0.82 mg/dL (ref 0.44–1.00)
GFR calc non Af Amer: >60 mL/min (ref >60)
Glucose, Bld: 91 mg/dL (ref 70–99)
Potassium: 3.9 mmol/L (ref 3.5–5.1)
Sodium: 137 mmol/L (ref 135–145)

## 2018-09-12 LAB — CBC
HCT: 36.4 % (ref 36.0–46.0)
Hemoglobin: 11.9 g/dL — ABNORMAL LOW (ref 12.0–15.0)
MCH: 32.2 pg (ref 26.0–34.0)
MCHC: 32.7 g/dL (ref 30.0–36.0)
MCV: 98.6 fL (ref 80.0–100.0)
NRBC: 0 % (ref 0.0–0.2)
Platelets: 244 10*3/uL (ref 150–400)
RBC: 3.69 MIL/uL — ABNORMAL LOW (ref 3.87–5.11)
RDW: 14.7 % (ref 11.5–15.5)
WBC: 7.4 10*3/uL (ref 4.0–10.5)

## 2018-09-12 NOTE — Progress Notes (Addendum)
PCP - Eldridge Abrahams, NP Cardiologist - denies (Dr. Geraldo Pitter did pre-op cardiac testing ECHO and ST)   Chest x-ray - N/A EKG - 09/12/2018 Stress Test - 08/19/18, low risk study ECHO - 08/10/18; normal study Cardiac Cath - denies  Sleep Study - denies  Blood Thinner Instructions: Xarelto; hold 1 day prior to procedure. LD-09/12/17  Anesthesia review: Yes; had to receive cardiac clearance.   Patient denies shortness of breath, fever, cough and chest pain at PAT appointment   Patient verbalized understanding of instructions that were given to them at the PAT appointment. Patient was also instructed that they will need to review over the PAT instructions again at home before surgery.

## 2018-09-13 NOTE — Progress Notes (Signed)
Anesthesia Chart Review:  Case:  [id number] Date/Time:  09/14/18 1214   Procedures:      INCISIONAL HERNIA REPAIR WITH MESH, POTENTIAL A-CELL XENOGRAFT MESH ERAS PATHWAY (N/A )     INSERTION OF MESH (N/A )   Anesthesia type:  General   Pre-op diagnosis:  Incisional hernia   Location:  [facility] OR ROOM 09 / [facility]   Surgeon:  Coralie Keens, MD      DISCUSSION: 38 yo female never smoker. Pertinent hx includes HTN, DOE, Hx of multiple PE (last 25yrs ago per notes, on chronic anticoagulation).  Pt was seen by Dr. Geraldo Pitter for preop risk stratification due to risk factors and hx of multiple PE. His note from 08/09/18 said she should minimize time off Xarelto due to risk of PE, recommended only off for 1d preop.  Lexiscan and echo were ordered for risk stratification,and he stated that if the tests were negative she would not be at high risk for surgery from cardiac standpoint.   Lexiscan 08/19/18 showed EF 67%, no ST segment deviation noted during stress, small fixed defect of moderate severity present in the apical lateral and apex location. This appears to be due breast attneuation . The apex contract normally .This is a low risk study. There is no evidence of ischemia or previous infarction  Echo 08/10/18 showed EF 60-65%, mild LVH, mild MR.   Anticipate she can proceed as planned barring acute status change.   VS: BP (!) 146/95   Pulse 72   Temp 36.7 C   Resp 20   Ht 5\' 1"  (1.549 m)   Wt 111.9 kg   SpO2 96%   BMI 46.59 kg/m   PROVIDERS: Berkley Harvey, NP is PCP  Jyl Heinz, MD is Cardiologist  LABS: Labs reviewed: Acceptable for surgery. (all labs ordered are listed, but only abnormal results are displayed)  Labs Reviewed  CBC - Abnormal; Notable for the following components:      Result Value   RBC 3.69 (*)    Hemoglobin 11.9 (*)    All other components within normal limits  BASIC METABOLIC PANEL - Abnormal; Notable for the following components:   Calcium 8.8 (*)    All  other components within normal limits     IMAGES: CT Abd/Pelvis 07/18/18: IMPRESSION: Redemonstration of ventral wall hernia at the umbilicus containing loops of small bowel. No evidence of bowel obstruction or significant inflammatory changes.  Liver steatosis.   EKG: 09/12/18: Normal sinus rhythm. Rate 74. Nonspecific T wave abnormality New since previous tracing  CV: TTE 08/10/18: Study Conclusions  - Left ventricle: The cavity size was normal. Wall thickness was   increased in a pattern of mild LVH. Systolic function was normal.   The estimated ejection fraction was in the range of 60% to 65%.   Wall motion was normal; there were no regional wall motion   abnormalities. - Mitral valve: There was mild regurgitation.  Impressions:  - 1. Normal systolic function. Mild LVH.   2. Mild MR.  Lexiscan stress 08/19/18:  Nuclear stress EF: 67%. The left ventricular ejection fraction is hyperdynamic (>65%).  There was no ST segment deviation noted during stress.  Defect 1: There is a small fixed defect of moderate severity present in the apical lateral and apex location. This appears to be due breast attneuation . The apex contract normally .  This is a low risk study. There is no evidence of ischemia or previous infarction  The study is normal.  Past Medical History:  Diagnosis Date  . Complication of anesthesia    "difficulty waking up and will fight when woken up"  . Depression   . Gallstones   . Hernia, ventral   . History of blood clots   . History of pulmonary embolus (PE)   . Hypertension   . Hyperthyroidism    01/15/17- in the past  . Shortness of breath dyspnea     Past Surgical History:  Procedure Laterality Date  . BREAST SURGERY     cyst removed from right  . CESAREAN SECTION    . CHOLECYSTECTOMY  01/18/2017  . CHOLECYSTECTOMY N/A 01/18/2017   Procedure: LAPAROSCOPIC CHOLECYSTECTOMY;  Surgeon: Coralie Keens, MD;  Location: [facility];  Service:  General;  Laterality: N/A;  . HERNIA REPAIR    . INCISIONAL HERNIA REPAIR  04/13/2016  . INCISIONAL HERNIA REPAIR N/A 04/13/2016   Procedure: INCISIONAL HERNIA REPAIR;  Surgeon: Coralie Keens, MD;  Location: [place];  Service: General;  Laterality: N/A;  . INSERTION OF MESH N/A 04/13/2016   Procedure: INSERTION OF MESH;  Surgeon: Coralie Keens, MD;  Location: [facility];  Service: General;  Laterality: N/A;  . LAPAROTOMY N/A 03/05/2017   Procedure: EXPLORATORY LAPAROTOMY;  Surgeon: Rolm Bookbinder, MD;  Location: [facility];  Service: General;  Laterality: N/A;  . LYSIS OF ADHESION N/A 03/05/2017   Procedure: LYSIS OF ADHESIONs;  Surgeon: Rolm Bookbinder, MD;  Location: [facility];  Service: General;  Laterality: N/A;  . PILONIDAL CYST / SINUS EXCISION    . removal of sweat gland Bilateral    arms    MEDICATIONS: . divalproex (DEPAKOTE) 500 MG DR tablet  . DULoxetine (CYMBALTA) 30 MG capsule  . levothyroxine (SYNTHROID, LEVOTHROID) 112 MCG tablet  . lisinopril (PRINIVIL,ZESTRIL) 5 MG tablet  . metoprolol (LOPRESSOR) 50 MG tablet  . Rivaroxaban (XARELTO) 20 MG TABS  . zolpidem (AMBIEN) 10 MG tablet   No current facility-administered medications for this encounter.    . technetium tetrofosmin (TC-MYOVIEW) injection 83.8 millicurie    [facility] Short Stay Center/Anesthesiology Phone [phone] 09/13/2018 9:31 AM

## 2018-09-13 NOTE — Anesthesia Preprocedure Evaluation (Addendum)
Anesthesia Evaluation  Patient identified by MRN, date of birth, ID band Patient awake    Reviewed: Allergy & Precautions, NPO status , Patient's Chart, lab work & pertinent test results, reviewed documented beta blocker date and time   History of Anesthesia Complications (+) history of anesthetic complications  Airway Mallampati: III  TM Distance: >3 FB Neck ROM: Full    Dental no notable dental hx. (+) Dental Advisory Given   Pulmonary neg pulmonary ROS,    Pulmonary exam normal        Cardiovascular hypertension, Pt. on medications and Pt. on home beta blockers Normal cardiovascular exam  Study Highlights    Nuclear stress EF: 67%. The left ventricular ejection fraction is hyperdynamic (>65%).  There was no ST segment deviation noted during stress.  Defect 1: There is a small fixed defect of moderate severity present in the apical lateral and apex location. This appears to be due breast attneuation . The apex contract normally .  This is a low risk study. There is no evidence of ischemia or previous infarction  The study is normal.  Study Conclusions  - Left ventricle: The cavity size was normal. Wall thickness was   increased in a pattern of mild LVH. Systolic function was normal.   The estimated ejection fraction was in the range of 60% to 65%.   Wall motion was normal; there were no regional wall motion   abnormalities. - Mitral valve: There was mild regurgitation.  Impressions:  - 1. Normal systolic function. Mild LVH.   2. Mild MR.    Neuro/Psych PSYCHIATRIC DISORDERS Depression negative neurological ROS     GI/Hepatic negative GI ROS, Neg liver ROS,   Endo/Other  Hypothyroidism Hyperthyroidism   Renal/GU negative Renal ROS     Musculoskeletal negative musculoskeletal ROS (+)   Abdominal   Peds  Hematology negative hematology ROS (+)   Anesthesia Other Findings Day of surgery  medications reviewed with the patient.  Reproductive/Obstetrics                                                            Anesthesia Evaluation  Patient identified by MRN, date of birth, ID band Patient awake    Reviewed: Allergy & Precautions, NPO status , Patient's Chart, lab work & pertinent test results, reviewed documented beta blocker date and time   History of Anesthesia Complications (+) Emergence Delirium and history of anesthetic complications  Airway Mallampati: II  TM Distance: >3 FB Neck ROM: Full    Dental  (+) Dental Advisory Given   Pulmonary neg pulmonary ROS, shortness of breath and with exertion, PE   Pulmonary exam normal breath sounds clear to auscultation       Cardiovascular hypertension, Pt. on medications and Pt. on home beta blockers Normal cardiovascular exam Rhythm:Regular Rate:Normal     Neuro/Psych negative neurological ROS     GI/Hepatic negative GI ROS, Neg liver ROS,   Endo/Other  Hypothyroidism Morbid obesity  Renal/GU negative Renal ROS     Musculoskeletal negative musculoskeletal ROS (+)   Abdominal   Peds  Hematology  (+) Blood dyscrasia (Xarelto), ,   Anesthesia Other Findings Day of surgery medications reviewed with the patient.  Reproductive/Obstetrics  Anesthesia Physical Anesthesia Plan  ASA: III  Anesthesia Plan: General   Post-op Pain Management:    Induction: Intravenous  PONV Risk Score and Plan:   Airway Management Planned: Oral ETT  Additional Equipment: CVP, Ultrasound Guidance Line Placement and Arterial line  Intra-op Plan:   Post-operative Plan: Possible Post-op intubation/ventilation  Informed Consent: I have reviewed the patients History and Physical, chart, labs and discussed the procedure including the risks, benefits and alternatives for the proposed anesthesia with the patient or authorized  representative who has indicated his/her understanding and acceptance.   Dental advisory given  Plan Discussed with: CRNA, Anesthesiologist and Surgeon  Anesthesia Plan Comments: (Risks/benefits of general anesthesia discussed with patient including risk of damage to teeth, lips, gum, and tongue, nausea/vomiting, allergic reactions to medications, and the possibility of heart attack, stroke and death.  All patient questions answered.  Patient wishes to proceed.)       Anesthesia Quick Evaluation  Anesthesia Physical Anesthesia Plan  ASA: III  Anesthesia Plan: General   Post-op Pain Management:    Induction: Intravenous  PONV Risk Score and Plan: 4 or greater and Ondansetron, Dexamethasone, Scopolamine patch - Pre-op, Diphenhydramine and Treatment may vary due to age or medical condition  Airway Management Planned: Oral ETT and LMA  Additional Equipment:   Intra-op Plan:   Post-operative Plan: Extubation in OR  Informed Consent: I have reviewed the patients History and Physical, chart, labs and discussed the procedure including the risks, benefits and alternatives for the proposed anesthesia with the patient or authorized representative who has indicated his/her understanding and acceptance.   Dental advisory given  Plan Discussed with: CRNA and Anesthesiologist  Anesthesia Plan Comments: (See PAT note by Karoline Caldwell, PA-C )     Anesthesia Quick Evaluation

## 2018-09-13 NOTE — H&P (Addendum)
[name] is an 38 y.o. female.   Chief Complaint: incisional hernia HPI: this is a 38 year old female well known to me who has a recurrent incisional hernia.  She has had multiple abdominal procedures.  She previously had a retrorectus TAR incisional hernia repair with mesh.  She later developed a bowel obstruction and had to have an exploratory laparotomy at which point the mesh had been transected.  She now has recurrent hernia which is symptomatic containing bowel.  She is actually in the midst of a workup to have a gastric bypass performed in [place].  We were going to try to wait on the hernia repair that it is now becoming more symptomatic.  He is having intermittent abdominal pain with some nausea.  Past Medical History:  Diagnosis Date  . Complication of anesthesia    "difficulty waking up and will fight when woken up"  . Depression   . Gallstones   . Hernia, ventral   . History of blood clots   . History of pulmonary embolus (PE)   . Hypertension   . Hyperthyroidism    01/15/17- in the past  . Shortness of breath dyspnea     Past Surgical History:  Procedure Laterality Date  . BREAST SURGERY     cyst removed from right  . CESAREAN SECTION    . CHOLECYSTECTOMY  01/18/2017  . CHOLECYSTECTOMY N/A 01/18/2017   Procedure: LAPAROSCOPIC CHOLECYSTECTOMY;  Surgeon: Coralie Keens, MD;  Location: [facility];  Service: General;  Laterality: N/A;  . HERNIA REPAIR    . INCISIONAL HERNIA REPAIR  04/13/2016  . INCISIONAL HERNIA REPAIR N/A 04/13/2016   Procedure: INCISIONAL HERNIA REPAIR;  Surgeon: Coralie Keens, MD;  Location: [facility] [place];  Service: General;  Laterality: N/A;  . INSERTION OF MESH N/A 04/13/2016   Procedure: INSERTION OF MESH;  Surgeon: Coralie Keens, MD;  Location: [place];  Service: General;  Laterality: N/A;  . LAPAROTOMY N/A 03/05/2017   Procedure: EXPLORATORY LAPAROTOMY;  Surgeon: Rolm Bookbinder, MD;  Location: [facility];  Service: General;  Laterality: N/A;  .  LYSIS OF ADHESION N/A 03/05/2017   Procedure: LYSIS OF ADHESIONs;  Surgeon: Rolm Bookbinder, MD;  Location: [facility] [place];  Service: General;  Laterality: N/A;  . PILONIDAL CYST / SINUS EXCISION    . removal of sweat gland Bilateral    arms    Family History  Problem Relation Age of Onset  . Hypertension Other   . Hyperlipidemia Other    Social History:  reports that she has never smoked. She has never used smokeless tobacco. She reports current alcohol use. She reports that she does not use drugs.  Allergies:  Allergies  Allergen Reactions  . Penicillins Anaphylaxis, Hives and Rash    PATIENT HAD A PCN REACTION WITH IMMEDIATE RASH, FACIAL/TONGUE/THROAT SWELLING, SOB, OR LIGHTHEADEDNESS WITH HYPOTENSION:   YES  Reaction causing SEVERE RASH INVOLVING MUCUS MEMBRANES or SKIN NECROSIS: #  #  #  YES  #  #  #   PATIENT HAS HAD A PCN REACTION THAT REQUIRED HOSPITALIZATION:  #  #  #  YES  #  #  #  Has patient had a PCN reaction occurring within the last 10 years: No  . Pollen Extract     UNSPECIFIED REACTION     No medications prior to admission.    Results for orders placed or performed during the hospital encounter of 09/12/18 (from the past 48 hour(s))  CBC     Status: Abnormal  Collection Time: 09/12/18  8:44 AM  Result Value Ref Range   WBC 7.4 4.0 - 10.5 K/uL   RBC 3.69 (L) 3.87 - 5.11 MIL/uL   Hemoglobin 11.9 (L) 12.0 - 15.0 g/dL   HCT 36.4 36.0 - 46.0 %   MCV 98.6 80.0 - 100.0 fL   MCH 32.2 26.0 - 34.0 pg   MCHC 32.7 30.0 - 36.0 g/dL   RDW 14.7 11.5 - 15.5 %   Platelets 244 150 - 400 K/uL   nRBC 0.0 0.0 - 0.2 %    Comment: Performed at [facility], [place] [address] [place], [place] [address]  Basic metabolic panel     Status: Abnormal   Collection Time: 09/12/18  8:44 AM  Result Value Ref Range   Sodium 137 135 - 145 mmol/L   Potassium 3.9 3.5 - 5.1 mmol/L   Chloride 106 98 - 111 mmol/L   CO2 25 22 - 32 mmol/L   Glucose, Bld 91 70 - 99 mg/dL   BUN 8 6 - 20 mg/dL    Creatinine, Ser 0.82 0.44 - 1.00 mg/dL   Calcium 8.8 (L) 8.9 - 10.3 mg/dL   GFR calc non Af Amer >60 >60 mL/min   GFR calc Af Amer >60 >60 mL/min   Anion gap 6 5 - 15    Comment: Performed at [facility], [place] [address] [place], [place] [address]   No results found.  Review of Systems  All other systems reviewed and are negative.   There were no vitals taken for this visit. Physical Exam  Constitutional: She is oriented to person, place, and time. She appears well-developed. No distress.  obese  HENT:  Head: Normocephalic and atraumatic.  Right Ear: External ear normal.  Left Ear: External ear normal.  Nose: Nose normal.  Mouth/Throat: Oropharynx is clear and moist. No oropharyngeal exudate.  Eyes: Pupils are equal, round, and reactive to light. No scleral icterus.  Neck: Normal range of motion. No tracheal deviation present.  Cardiovascular: Normal rate, regular rhythm and normal heart sounds.  Respiratory: Effort normal and breath sounds normal. No respiratory distress.  GI: Soft.  Abdomen is morbidly obese. There is a reducible large incisional hernia  Musculoskeletal: Normal range of motion.  Neurological: She is alert and oriented to person, place, and time.  Skin: Skin is warm. She is not diaphoretic.  Psychiatric: Her behavior is normal. Judgment normal.     Assessment/Plan Recurrent large incisional hernia  Open repair with mesh is recommended.  We have discussed this in great detail.  We have discussed use of mesh.  We have discussed a possible a-cell xenograft which would hopefully better reinforce her fascia and make it less difficult for her bariatric surgeons to eventually do a laparoscopic gastric bypass or gastric sleeve.  We discussed using regular mesh.  We discussed having to use other biologic if the bowel is injured during surgery.  We discussed risks of surgery which includes but is not limited to bleeding, infection, cardiopulmonary issues,  injury to surrounding structures, hernia recurrence, DVT, etc.  She will be admitted post op to the hospital and I suspect she will be in several days for pain control, resumption of anticoag meds, etc. She understands and agrees to proceed  Coralie Keens, MD 09/13/2018, 2:20 PM

## 2018-09-14 ENCOUNTER — Ambulatory Visit (HOSPITAL_COMMUNITY): Payer: Medicaid Other | Admitting: Physician Assistant

## 2018-09-14 ENCOUNTER — Other Ambulatory Visit: Payer: Self-pay

## 2018-09-14 ENCOUNTER — Inpatient Hospital Stay (HOSPITAL_COMMUNITY)
Admission: RE | Admit: 2018-09-14 | Discharge: 2018-09-19 | DRG: 336 | Disposition: A | Discharge status: Home / Self Care | Payer: Medicaid Other | Attending: Surgery | Admitting: Surgery

## 2018-09-14 ENCOUNTER — Encounter (HOSPITAL_COMMUNITY)
Admission: RE | Disposition: A | Discharge status: Home / Self Care | Payer: Self-pay | Source: Home / Self Care | Attending: Surgery

## 2018-09-14 ENCOUNTER — Ambulatory Visit (HOSPITAL_COMMUNITY): Payer: Medicaid Other | Admitting: Anesthesiology

## 2018-09-14 ENCOUNTER — Encounter (HOSPITAL_COMMUNITY): Payer: Self-pay | Admitting: *Deleted

## 2018-09-14 DIAGNOSIS — Z6841 Body Mass Index (BMI) 40.0 and over, adult: Secondary | ICD-10-CM | POA: Diagnosis not present

## 2018-09-14 DIAGNOSIS — K432 Incisional hernia without obstruction or gangrene: Secondary | ICD-10-CM | POA: Diagnosis present

## 2018-09-14 DIAGNOSIS — K660 Peritoneal adhesions (postprocedural) (postinfection): Secondary | ICD-10-CM | POA: Diagnosis present

## 2018-09-14 DIAGNOSIS — Z86711 Personal history of pulmonary embolism: Secondary | ICD-10-CM | POA: Diagnosis not present

## 2018-09-14 DIAGNOSIS — Z880 Allergy status to penicillin: Secondary | ICD-10-CM | POA: Diagnosis not present

## 2018-09-14 HISTORY — PX: INSERTION OF MESH: SHX5868

## 2018-09-14 HISTORY — PX: INCISIONAL HERNIA REPAIR: SHX193

## 2018-09-14 LAB — POCT PREGNANCY, URINE: Preg Test, Ur: NEGATIVE

## 2018-09-14 LAB — PROTIME-INR
INR: 0.95
Prothrombin Time: 12.5 seconds (ref 11.4–15.2)

## 2018-09-14 SURGERY — REPAIR, HERNIA, INCISIONAL
Anesthesia: General

## 2018-09-14 MED ORDER — MORPHINE SULFATE 2 MG/ML IV SOLN
INTRAVENOUS | Status: DC
  Administered 2018-09-14: 16:00:00 via INTRAVENOUS
  Administered 2018-09-15: 3 mg via INTRAVENOUS
  Filled 2018-09-14: qty 50

## 2018-09-14 MED ORDER — ONDANSETRON HCL 4 MG/2ML IJ SOLN: 4.0000 mg | Freq: Four times a day (QID) | INTRAMUSCULAR | Status: DC | PRN

## 2018-09-14 MED ORDER — MIDAZOLAM HCL 2 MG/2ML IJ SOLN
INTRAMUSCULAR | Status: DC | PRN
  Administered 2018-09-14: 2 mg via INTRAVENOUS

## 2018-09-14 MED ORDER — ROCURONIUM BROMIDE 50 MG/5ML IV SOSY
PREFILLED_SYRINGE | INTRAVENOUS | Status: AC
  Filled 2018-09-14: qty 5

## 2018-09-14 MED ORDER — FENTANYL CITRATE (PF) 250 MCG/5ML IJ SOLN
INTRAMUSCULAR | Status: AC
  Filled 2018-09-14: qty 5

## 2018-09-14 MED ORDER — DIPHENHYDRAMINE HCL 50 MG/ML IJ SOLN
INTRAMUSCULAR | Status: DC | PRN
  Administered 2018-09-14: 12.5 mg via INTRAVENOUS

## 2018-09-14 MED ORDER — DIPHENHYDRAMINE HCL 50 MG/ML IJ SOLN: 12.5000 mg | Freq: Four times a day (QID) | INTRAMUSCULAR | Status: DC | PRN

## 2018-09-14 MED ORDER — LACTATED RINGERS IV SOLN
INTRAVENOUS | Status: DC
  Administered 2018-09-14 (×2): via INTRAVENOUS

## 2018-09-14 MED ORDER — METOPROLOL TARTRATE 50 MG PO TABS
50.0000 mg | ORAL_TABLET | Freq: Two times a day (BID) | ORAL | Status: DC
  Administered 2018-09-14 – 2018-09-19 (×7): 50 mg via ORAL
  Filled 2018-09-14 (×9): qty 1

## 2018-09-14 MED ORDER — PROPOFOL 10 MG/ML IV BOLUS
INTRAVENOUS | Status: DC | PRN
  Administered 2018-09-14: 200 mg via INTRAVENOUS

## 2018-09-14 MED ORDER — ONDANSETRON 4 MG PO TBDP: 4.0000 mg | ORAL_TABLET | Freq: Four times a day (QID) | ORAL | Status: DC | PRN

## 2018-09-14 MED ORDER — LIDOCAINE 2% (20 MG/ML) 5 ML SYRINGE
INTRAMUSCULAR | Status: AC
  Filled 2018-09-14: qty 5

## 2018-09-14 MED ORDER — DEXAMETHASONE SODIUM PHOSPHATE 10 MG/ML IJ SOLN
INTRAMUSCULAR | Status: DC | PRN
  Administered 2018-09-14: 10 mg via INTRAVENOUS

## 2018-09-14 MED ORDER — DIPHENHYDRAMINE HCL 25 MG PO CAPS
25.0000 mg | ORAL_CAPSULE | Freq: Four times a day (QID) | ORAL | Status: DC | PRN
  Administered 2018-09-15 – 2018-09-19 (×8): 25 mg via ORAL
  Filled 2018-09-14 (×7): qty 1

## 2018-09-14 MED ORDER — BUPIVACAINE-EPINEPHRINE 0.25% -1:200000 IJ SOLN
INTRAMUSCULAR | Status: DC | PRN
  Administered 2018-09-14: 20 mL

## 2018-09-14 MED ORDER — LIDOCAINE 2% (20 MG/ML) 5 ML SYRINGE
INTRAMUSCULAR | Status: DC | PRN
  Administered 2018-09-14: 100 mg via INTRAVENOUS

## 2018-09-14 MED ORDER — PHENOL 1.4 % MT LIQD
1.0000 | OROMUCOSAL | Status: DC | PRN
  Administered 2018-09-14: 1 via OROMUCOSAL
  Filled 2018-09-14: qty 177

## 2018-09-14 MED ORDER — LEVOTHYROXINE SODIUM 112 MCG PO TABS
112.0000 ug | ORAL_TABLET | Freq: Every day | ORAL | Status: DC
  Administered 2018-09-15 – 2018-09-19 (×5): 112 ug via ORAL
  Filled 2018-09-14 (×5): qty 1

## 2018-09-14 MED ORDER — ONDANSETRON HCL 4 MG/2ML IJ SOLN
INTRAMUSCULAR | Status: DC | PRN
  Administered 2018-09-14: 4 mg via INTRAVENOUS

## 2018-09-14 MED ORDER — CIPROFLOXACIN IN D5W 400 MG/200ML IV SOLN
INTRAVENOUS | Status: AC
  Filled 2018-09-14: qty 200

## 2018-09-14 MED ORDER — CHLORHEXIDINE GLUCONATE CLOTH 2 % EX PADS: 6.0000 | MEDICATED_PAD | Freq: Once | CUTANEOUS | Status: DC

## 2018-09-14 MED ORDER — NALOXONE HCL 0.4 MG/ML IJ SOLN: 0.4000 mg | INTRAMUSCULAR | Status: DC | PRN

## 2018-09-14 MED ORDER — PROMETHAZINE HCL 25 MG/ML IJ SOLN: 6.2500 mg | INTRAMUSCULAR | Status: DC | PRN

## 2018-09-14 MED ORDER — ALBUMIN HUMAN 5 % IV SOLN
INTRAVENOUS | Status: DC | PRN
  Administered 2018-09-14: 14:00:00 via INTRAVENOUS

## 2018-09-14 MED ORDER — GABAPENTIN 300 MG PO CAPS
300.0000 mg | ORAL_CAPSULE | ORAL | Status: AC
  Administered 2018-09-14: 300 mg via ORAL

## 2018-09-14 MED ORDER — DIVALPROEX SODIUM 500 MG PO DR TAB
1000.0000 mg | DELAYED_RELEASE_TABLET | Freq: Every day | ORAL | Status: DC
  Administered 2018-09-14 – 2018-09-18 (×5): 1000 mg via ORAL
  Filled 2018-09-14 (×5): qty 2

## 2018-09-14 MED ORDER — ZOLPIDEM TARTRATE 5 MG PO TABS
5.0000 mg | ORAL_TABLET | Freq: Every day | ORAL | Status: DC
  Administered 2018-09-14 – 2018-09-18 (×5): 5 mg via ORAL
  Filled 2018-09-14 (×5): qty 1

## 2018-09-14 MED ORDER — SODIUM CHLORIDE 0.9% FLUSH: 9.0000 mL | INTRAVENOUS | Status: DC | PRN

## 2018-09-14 MED ORDER — ROCURONIUM BROMIDE 10 MG/ML (PF) SYRINGE
PREFILLED_SYRINGE | INTRAVENOUS | Status: DC | PRN
  Administered 2018-09-14: 20 mg via INTRAVENOUS
  Administered 2018-09-14: 50 mg via INTRAVENOUS

## 2018-09-14 MED ORDER — POTASSIUM CHLORIDE IN NACL 20-0.9 MEQ/L-% IV SOLN
INTRAVENOUS | Status: DC
  Administered 2018-09-14 – 2018-09-18 (×4): via INTRAVENOUS
  Filled 2018-09-14 (×2): qty 1000

## 2018-09-14 MED ORDER — 0.9 % SODIUM CHLORIDE (POUR BTL) OPTIME
TOPICAL | Status: DC | PRN
  Administered 2018-09-14: 1000 mL

## 2018-09-14 MED ORDER — MIDAZOLAM HCL 2 MG/2ML IJ SOLN
INTRAMUSCULAR | Status: AC
  Filled 2018-09-14: qty 2

## 2018-09-14 MED ORDER — SUGAMMADEX SODIUM 200 MG/2ML IV SOLN
INTRAVENOUS | Status: DC | PRN
  Administered 2018-09-14: 230 mg via INTRAVENOUS

## 2018-09-14 MED ORDER — ACETAMINOPHEN 500 MG PO TABS
ORAL_TABLET | ORAL | Status: AC
  Administered 2018-09-14: 1000 mg via ORAL
  Filled 2018-09-14: qty 2

## 2018-09-14 MED ORDER — ACETAMINOPHEN 500 MG PO TABS
1000.0000 mg | ORAL_TABLET | ORAL | Status: AC
  Administered 2018-09-14: 1000 mg via ORAL

## 2018-09-14 MED ORDER — BUPIVACAINE-EPINEPHRINE (PF) 0.25% -1:200000 IJ SOLN
INTRAMUSCULAR | Status: AC
  Filled 2018-09-14: qty 30

## 2018-09-14 MED ORDER — LISINOPRIL 5 MG PO TABS
5.0000 mg | ORAL_TABLET | Freq: Every day | ORAL | Status: DC
  Administered 2018-09-15 – 2018-09-19 (×5): 5 mg via ORAL
  Filled 2018-09-14 (×5): qty 1

## 2018-09-14 MED ORDER — SCOPOLAMINE 1 MG/3DAYS TD PT72
1.0000 | MEDICATED_PATCH | TRANSDERMAL | Status: DC
  Administered 2018-09-14: 1.5 mg via TRANSDERMAL
  Filled 2018-09-14: qty 1

## 2018-09-14 MED ORDER — PHENYLEPHRINE 40 MCG/ML (10ML) SYRINGE FOR IV PUSH (FOR BLOOD PRESSURE SUPPORT)
PREFILLED_SYRINGE | INTRAVENOUS | Status: DC | PRN
  Administered 2018-09-14: 120 ug via INTRAVENOUS
  Administered 2018-09-14: 160 ug via INTRAVENOUS
  Administered 2018-09-14: 120 ug via INTRAVENOUS

## 2018-09-14 MED ORDER — METHOCARBAMOL 500 MG PO TABS
500.0000 mg | ORAL_TABLET | Freq: Four times a day (QID) | ORAL | Status: DC | PRN
  Administered 2018-09-15 – 2018-09-19 (×2): 500 mg via ORAL
  Filled 2018-09-14 (×2): qty 1

## 2018-09-14 MED ORDER — SUCCINYLCHOLINE CHLORIDE 200 MG/10ML IV SOSY
PREFILLED_SYRINGE | INTRAVENOUS | Status: DC | PRN
  Administered 2018-09-14: 120 mg via INTRAVENOUS

## 2018-09-14 MED ORDER — FENTANYL CITRATE (PF) 100 MCG/2ML IJ SOLN
INTRAMUSCULAR | Status: DC | PRN
  Administered 2018-09-14 (×2): 100 ug via INTRAVENOUS

## 2018-09-14 MED ORDER — LIDOCAINE IN D5W 4-5 MG/ML-% IV SOLN
1.0000 mg/min | INTRAVENOUS | Status: AC
  Administered 2018-09-14: 25 ug/kg/min via INTRAVENOUS
  Filled 2018-09-14: qty 500

## 2018-09-14 MED ORDER — DEXMEDETOMIDINE HCL IN NACL 200 MCG/50ML IV SOLN
INTRAVENOUS | Status: DC | PRN
  Administered 2018-09-14: 0.7 ug/kg/h via INTRAVENOUS

## 2018-09-14 MED ORDER — FENTANYL CITRATE (PF) 100 MCG/2ML IJ SOLN: 25.0000 ug | INTRAMUSCULAR | Status: DC | PRN

## 2018-09-14 MED ORDER — DEXAMETHASONE SODIUM PHOSPHATE 10 MG/ML IJ SOLN
INTRAMUSCULAR | Status: AC
  Filled 2018-09-14: qty 1

## 2018-09-14 MED ORDER — DIPHENHYDRAMINE HCL 50 MG/ML IJ SOLN
25.0000 mg | Freq: Four times a day (QID) | INTRAMUSCULAR | Status: DC | PRN
  Administered 2018-09-16 – 2018-09-18 (×4): 25 mg via INTRAVENOUS
  Filled 2018-09-14 (×5): qty 1

## 2018-09-14 MED ORDER — ENOXAPARIN SODIUM 40 MG/0.4ML ~~LOC~~ SOLN
40.0000 mg | SUBCUTANEOUS | Status: DC
  Administered 2018-09-15 – 2018-09-16 (×2): 40 mg via SUBCUTANEOUS
  Filled 2018-09-14 (×2): qty 0.4

## 2018-09-14 MED ORDER — GABAPENTIN 300 MG PO CAPS
ORAL_CAPSULE | ORAL | Status: AC
  Administered 2018-09-14: 300 mg via ORAL
  Filled 2018-09-14: qty 1

## 2018-09-14 MED ORDER — BUPIVACAINE LIPOSOME 1.3 % IJ SUSP
20.0000 mL | INTRAMUSCULAR | Status: AC
  Administered 2018-09-14: 20 mL
  Filled 2018-09-14: qty 20

## 2018-09-14 MED ORDER — DIPHENHYDRAMINE HCL 12.5 MG/5ML PO ELIX: 12.5000 mg | ORAL_SOLUTION | Freq: Four times a day (QID) | ORAL | Status: DC | PRN

## 2018-09-14 MED ORDER — CIPROFLOXACIN IN D5W 400 MG/200ML IV SOLN
400.0000 mg | INTRAVENOUS | Status: AC
  Administered 2018-09-14: 400 mg via INTRAVENOUS

## 2018-09-14 MED ORDER — DULOXETINE HCL 30 MG PO CPEP
30.0000 mg | ORAL_CAPSULE | Freq: Two times a day (BID) | ORAL | Status: DC
  Administered 2018-09-14 – 2018-09-19 (×10): 30 mg via ORAL
  Filled 2018-09-14 (×10): qty 1

## 2018-09-14 MED ORDER — PROPOFOL 10 MG/ML IV BOLUS
INTRAVENOUS | Status: AC
  Filled 2018-09-14: qty 20

## 2018-09-14 SURGICAL SUPPLY — 48 items
ADH SKN CLS APL DERMABOND .7 (GAUZE/BANDAGES/DRESSINGS)
BINDER ABDOMINAL 12 ML 46-62 (SOFTGOODS) ×1 IMPLANT
BIOPATCH RED 1 DISK 7.0 (GAUZE/BANDAGES/DRESSINGS) ×1 IMPLANT
BLADE CLIPPER SURG (BLADE) IMPLANT
CANISTER SUCT 3000ML PPV (MISCELLANEOUS) ×2 IMPLANT
CHLORAPREP W/TINT 26ML (MISCELLANEOUS) ×3 IMPLANT
COVER SURGICAL LIGHT HANDLE (MISCELLANEOUS) ×2 IMPLANT
COVER WAND RF STERILE (DRAPES) ×1 IMPLANT
DERMABOND ADVANCED (GAUZE/BANDAGES/DRESSINGS)
DERMABOND ADVANCED .7 DNX12 (GAUZE/BANDAGES/DRESSINGS) IMPLANT
DRAIN CHANNEL 19F RND (DRAIN) ×1 IMPLANT
DRAPE LAPAROSCOPIC ABDOMINAL (DRAPES) ×2 IMPLANT
DRSG OPSITE POSTOP 4X8 (GAUZE/BANDAGES/DRESSINGS) ×1 IMPLANT
DRSG TEGADERM 4X4.75 (GAUZE/BANDAGES/DRESSINGS) ×1 IMPLANT
ELECT CAUTERY BLADE 6.4 (BLADE) ×1 IMPLANT
ELECT REM PT RETURN 9FT ADLT (ELECTROSURGICAL) ×2
ELECTRODE REM PT RTRN 9FT ADLT (ELECTROSURGICAL) ×1 IMPLANT
EVACUATOR SILICONE 100CC (DRAIN) ×1 IMPLANT
GAUZE SPONGE 4X4 12PLY STRL (GAUZE/BANDAGES/DRESSINGS) IMPLANT
GLOVE SURG SIGNA 7.5 PF LTX (GLOVE) ×2 IMPLANT
GOWN STRL REUS W/ TWL LRG LVL3 (GOWN DISPOSABLE) ×1 IMPLANT
GOWN STRL REUS W/ TWL XL LVL3 (GOWN DISPOSABLE) ×1 IMPLANT
GOWN STRL REUS W/TWL LRG LVL3 (GOWN DISPOSABLE) ×4
GOWN STRL REUS W/TWL XL LVL3 (GOWN DISPOSABLE) ×2
GRAFT MATRIX GENTRIX PLS 10X15 (Tissue) ×1 IMPLANT
KIT BASIN OR (CUSTOM PROCEDURE TRAY) ×2 IMPLANT
KIT TURNOVER KIT B (KITS) ×2 IMPLANT
MARKER SKIN DUAL TIP RULER LAB (MISCELLANEOUS) ×1 IMPLANT
NDL HYPO 25GX1X1/2 BEV (NEEDLE) ×1 IMPLANT
NEEDLE HYPO 25GX1X1/2 BEV (NEEDLE) ×2 IMPLANT
NS IRRIG 1000ML POUR BTL (IV SOLUTION) ×2 IMPLANT
PACK GENERAL/GYN (CUSTOM PROCEDURE TRAY) ×2 IMPLANT
PAD ARMBOARD 7.5X6 YLW CONV (MISCELLANEOUS) ×2 IMPLANT
PENCIL SMOKE EVACUATOR (MISCELLANEOUS) ×2 IMPLANT
STAPLER VISISTAT 35W (STAPLE) ×1 IMPLANT
SUT ETHILON 2 0 FS 18 (SUTURE) ×1 IMPLANT
SUT MNCRL AB 4-0 PS2 18 (SUTURE) ×1 IMPLANT
SUT NOVA 1 T20/GS 25DT (SUTURE) ×1 IMPLANT
SUT NOVA NAB DX-16 0-1 5-0 T12 (SUTURE) ×2 IMPLANT
SUT PDS AB 2-0 CT1 27 (SUTURE) ×4 IMPLANT
SUT PROLENE 1 CT (SUTURE) IMPLANT
SUT VIC AB 3-0 SH 27 (SUTURE) ×2
SUT VIC AB 3-0 SH 27X BRD (SUTURE) IMPLANT
SUT VIC AB 3-0 SH 27XBRD (SUTURE) IMPLANT
SYR CONTROL 10ML LL (SYRINGE) IMPLANT
TOWEL OR 17X24 6PK STRL BLUE (TOWEL DISPOSABLE) ×1 IMPLANT
TOWEL OR 17X26 10 PK STRL BLUE (TOWEL DISPOSABLE) ×1 IMPLANT
TRAY FOLEY MTR SLVR 14FR STAT (SET/KITS/TRAYS/PACK) ×1 IMPLANT

## 2018-09-14 NOTE — Anesthesia Postprocedure Evaluation (Signed)
Anesthesia Post Note  Patient: [name]  Procedure(s) Performed: INCISIONAL HERNIA REPAIR WITH MESH, POTENTIAL A-CELL XENOGRAFT MESH ERAS PATHWAY (N/A ) INSERTION OF MESH (N/A )     Patient location during evaluation: PACU Anesthesia Type: General Level of consciousness: sedated Pain management: pain level controlled Vital Signs Assessment: post-procedure vital signs reviewed and stable Respiratory status: spontaneous breathing and respiratory function stable Cardiovascular status: stable Postop Assessment: no apparent nausea or vomiting Anesthetic complications: no    Last Vitals:  Vitals:   09/14/18 1634 09/14/18 1646  BP: 96/68   Pulse: 71   Resp: 14   Temp:  36.5 C  SpO2: 100%     Last Pain:  Vitals:   09/14/18 1646  TempSrc:   PainSc: 0-No pain                 Israa Caban DANIEL

## 2018-09-14 NOTE — Transfer of Care (Signed)
Immediate Anesthesia Transfer of Care Note  Patient: [name]  Procedure(s) Performed: INCISIONAL HERNIA REPAIR WITH MESH, POTENTIAL A-CELL XENOGRAFT MESH ERAS PATHWAY (N/A ) INSERTION OF MESH (N/A )  Patient Location: PACU  Anesthesia Type:General  Level of Consciousness: sedated  Airway & Oxygen Therapy: Patient Spontanous Breathing and Patient connected to face mask oxygen  Post-op Assessment: Report given to RN and Post -op Vital signs reviewed and stable  Post vital signs: Reviewed and stable  Last Vitals:  Vitals Value Taken Time  BP 140/78 09/14/2018  3:21 PM  Temp 36.5 C 09/14/2018  3:21 PM  Pulse 71 09/14/2018  3:26 PM  Resp 19 09/14/2018  3:26 PM  SpO2 96 % 09/14/2018  3:26 PM  Vitals shown include unvalidated device data.  Last Pain:  Vitals:   09/14/18 1521  TempSrc:   PainSc: 0-No pain      Patients Stated Pain Goal: 3 (29/56/21 3086)  Complications: No apparent anesthesia complications.

## 2018-09-14 NOTE — Op Note (Signed)
[name] 09/14/2018   Pre-op Diagnosis: Incisional hernia     Post-op Diagnosis: same  Procedure(s): INCISIONAL HERNIA REPAIR WITH MESH, A-CELL GENTRIX SURGICAL MATRIX PLUS (10 CM X 15 CM) ONLAY  Surgeon(s): Coralie Keens, MD  Anesthesia: General  Staff:  Circulator: Hulan Fess, RN Relief Circulator: Laurette Schimke, RN Scrub Person: Deland Pretty, RN; Dollene Cleveland T Vendor Representative : August Luz Circulator Assistant: Gifford Shave, RN  Estimated Blood Loss: Minimal               Indications: This is a 38 year old morbidly obese female who has had multiple abdominal procedures.  She had a TAR retrorectus incisional hernia repair with mesh in 2017.  She developed a small bowel obstruction in 2018 requiring an exploratory laparotomy.  She has now developed a recurrent hernia containing intermittently incarcerating small bowel which is been symptomatic.  The decision was made to proceed with repair.  Findings: The patient was found to have a 5 to 6 cm fascial defect at the midline around the umbilicus where the mesh had pulled apart what had been previously sutured back together.  I was able to reapproximate the mesh and fascia and then was able to cover it with an onlay Gentrix Surgical Matrix  Procedure: The patient was brought to the operating room and identified as correct patient.  She is placed upon the operating table general anesthesia was induced.  Her abdomen was then prepped and draped in usual sterile fashion.  I performed an elliptical incision including her previous scar at the midline.  I excised the skin and underlying scar tissue at the surgical scar.  I then identified the fascia.  The patient had a fascial defect where the fascial and underlay mesh had pulled apart where had been sutured previously.  I removed some visible sutures.  It was a round defect measuring 5 to 6 cm in size.  I was able to free up the incarcerated bowel  circumferentially with Metzenbaum scissors around the hernia and reduced back into the abdominal cavity.  The patient had extensive adhesions surrounding this.  I was able to free up the undersurface several centimeters in all directions.  I was then able to reapproximate the fascia and the mesh closing the defect with interrupted and figure-of-eight #1 Novafil sutures.  This was the symptomatic hernia.  At this point I undermined the skin and expose the fascia circumferentially with the cautery.  Her plan is to eventually have bariatric surgery so I did not want to place another piece of synthetic Prolene mesh.  I elected to use a piece of Gentrix surgical matrix as an onlay to widely cover the fascia.  A 10 cm x 15 cm piece was brought to the field.  It was soaked the appropriate amount of time and saline.  I then placed as an onlay over the fascial closure.  I then sewed it in place with 2-0 PDS sutures circumferentially.  Wide coverage of the repair was achieved in all directions.  Prior to this, I anesthetized the fascia with Exparel.  I anesthetized the skin also with Exparel.  Once the mesh was in place, I placed a 60 [place] Blake drain over the top 3 separate skin incision.  The drain was sewn in place with a nylon suture.  We irrigated the wound thoroughly with saline.  I closed the subcutaneous tissue with a running 3-0 Vicryl suture.  I then closed skin with skin staples.  The drain  was placed to bulb suction.  Dressings were applied.  The patient tolerated the procedure well.  All the counts were correct at the end of the procedure.  The patient was then extubated in the operating room and taken in a stable condition to the recovery room.          Coralie Keens   Date: 09/14/2018  Time: 3:03 PM

## 2018-09-14 NOTE — Progress Notes (Signed)
Pt received from PACU with PCA MSO4.  Placed on O2 at 3 L presently.  Honeycomb dressing under abd binder.

## 2018-09-14 NOTE — Interval H&P Note (Signed)
History and Physical Interval Note:no change in H and P  09/14/2018 11:45 AM  [name]  has presented today for surgery, with the diagnosis of Incisional hernia  The various methods of treatment have been discussed with the patient and family. After consideration of risks, benefits and other options for treatment, the patient has consented to  Procedure(s): INCISIONAL HERNIA REPAIR WITH MESH, POTENTIAL A-CELL XENOGRAFT MESH ERAS PATHWAY (N/A) INSERTION OF MESH (N/A) as a surgical intervention .  The patient's history has been reviewed, patient examined, no change in status, stable for surgery.  I have reviewed the patient's chart and labs.  Questions were answered to the patient's satisfaction.     Coralie Keens

## 2018-09-15 ENCOUNTER — Encounter (HOSPITAL_COMMUNITY): Payer: Self-pay | Admitting: General Practice

## 2018-09-15 LAB — CBC
HCT: 35.2 % — ABNORMAL LOW (ref 36.0–46.0)
Hemoglobin: 11.7 g/dL — ABNORMAL LOW (ref 12.0–15.0)
MCH: 32.5 pg (ref 26.0–34.0)
MCHC: 33.2 g/dL (ref 30.0–36.0)
MCV: 97.8 fL (ref 80.0–100.0)
Platelets: 259 10*3/uL (ref 150–400)
RBC: 3.6 MIL/uL — ABNORMAL LOW (ref 3.87–5.11)
RDW: 14.7 % (ref 11.5–15.5)
WBC: 11.5 10*3/uL — ABNORMAL HIGH (ref 4.0–10.5)
nRBC: 0 % (ref 0.0–0.2)

## 2018-09-15 LAB — BASIC METABOLIC PANEL
Anion gap: 8 (ref 5–15)
BUN: 7 mg/dL (ref 6–20)
CO2: 25 mmol/L (ref 22–32)
CREATININE: 0.88 mg/dL (ref 0.44–1.00)
Calcium: 8.2 mg/dL — ABNORMAL LOW (ref 8.9–10.3)
Chloride: 103 mmol/L (ref 98–111)
GFR calc Af Amer: >60 mL/min (ref >60)
GFR calc non Af Amer: >60 mL/min (ref >60)
Glucose, Bld: 118 mg/dL — ABNORMAL HIGH (ref 70–99)
Potassium: 4.5 mmol/L (ref 3.5–5.1)
Sodium: 136 mmol/L (ref 135–145)

## 2018-09-15 MED ORDER — KETOROLAC TROMETHAMINE 30 MG/ML IJ SOLN
30.0000 mg | Freq: Four times a day (QID) | INTRAMUSCULAR | Status: AC
  Administered 2018-09-15 (×2): 30 mg via INTRAVENOUS
  Filled 2018-09-15 (×2): qty 1

## 2018-09-15 MED ORDER — HYDROXYZINE HCL 25 MG PO TABS
25.0000 mg | ORAL_TABLET | Freq: Three times a day (TID) | ORAL | Status: DC | PRN
  Administered 2018-09-15 – 2018-09-19 (×11): 25 mg via ORAL
  Filled 2018-09-15 (×11): qty 1

## 2018-09-15 MED ORDER — OXYCODONE HCL 5 MG PO TABS
5.0000 mg | ORAL_TABLET | ORAL | Status: DC | PRN
  Administered 2018-09-15 – 2018-09-19 (×7): 10 mg via ORAL
  Filled 2018-09-15 (×7): qty 2

## 2018-09-15 MED ORDER — HYDROMORPHONE HCL 1 MG/ML IJ SOLN
1.0000 mg | INTRAMUSCULAR | Status: DC | PRN
  Administered 2018-09-15 – 2018-09-19 (×17): 1 mg via INTRAVENOUS
  Filled 2018-09-15 (×19): qty 1

## 2018-09-15 MED ORDER — WHITE PETROLATUM EX OINT
TOPICAL_OINTMENT | CUTANEOUS | Status: AC
  Administered 2018-09-15: 1
  Filled 2018-09-15: qty 28.35

## 2018-09-15 NOTE — Plan of Care (Signed)
  Problem: Clinical Measurements: Goal: Ability to maintain clinical measurements within normal limits will improve Outcome: Progressing   

## 2018-09-15 NOTE — Progress Notes (Addendum)
Pt c/o abdominal pain(10/10 pain scale). States Morphine(PCA) does not seem to help her. Notfied on call Dr. Brantley Stage via text page.

## 2018-09-15 NOTE — Progress Notes (Signed)
Wasted 19 mls of Morphine with Hulan Amato RN

## 2018-09-15 NOTE — Progress Notes (Signed)
Wasted 19 mg of PCA MSO4 with Derald Macleod, RN.

## 2018-09-15 NOTE — Progress Notes (Signed)
1 Day Post-Op   Subjective/Chief Complaint: Stable over night Moderate pain No nausea   Objective: Vital signs in last 24 hours: Temp:  [97.4 F (36.3 C)-98 F (36.7 C)] 97.4 F (36.3 C) (01/09 8127) Pulse Rate:  [56-72] 65 (01/09 0642) Resp:  [14-24] 16 (01/09 0642) BP: (93-141)/(62-90) 141/90 (01/09 0642) SpO2:  [96 %-100 %] 100 % (01/09 0642) Weight:  [111.6 kg-111.8 kg] 111.6 kg (01/08 1700)    Intake/Output from previous day: 01/08 0701 - 01/09 0700 In: 1961.6 [I.V.:1711.6; IV Piggyback:250] Out: 893 [Urine:760; Drains:133] Intake/Output this shift: No intake/output data recorded.  Exam: Lungs clear Binder in place Dressing dry, abdomen soft Drain serosang  Lab Results:  Recent Labs    09/12/18 0844 09/15/18 0341  WBC 7.4 11.5*  HGB 11.9* 11.7*  HCT 36.4 35.2*  PLT 244 259   BMET Recent Labs    09/12/18 0844 09/15/18 0341  NA 137 136  K 3.9 4.5  CL 106 103  CO2 25 25  GLUCOSE 91 118*  BUN 8 7  CREATININE 0.82 0.88  CALCIUM 8.8* 8.2*   PT/INR Recent Labs    09/14/18 1056  LABPROT 12.5  INR 0.95   ABG No results for input(s): PHART, HCO3 in the last 72 hours.  Invalid input(s): PCO2, PO2  Studies/Results: No results found.  Anti-infectives: Anti-infectives (From admission, onward)   Start     Dose/Rate Route Frequency Ordered Stop   09/14/18 1030  ciprofloxacin (CIPRO) IVPB 400 mg     400 mg 200 mL/hr over 60 Minutes Intravenous On call to O.R. 09/14/18 1028 09/14/18 1330   09/14/18 1025  ciprofloxacin (CIPRO) 400 MG/200ML IVPB    Note to Pharmacy:  [facility]   : cabinet override      09/14/18 1025 09/14/18 1330      Assessment/Plan: s/p Procedure(s): INCISIONAL HERNIA REPAIR WITH MESH, POTENTIAL A-CELL XENOGRAFT MESH ERAS PATHWAY (N/A) INSERTION OF MESH (N/A)  D/c PCA Advance po Ambulate D/c foley  LOS: 1 day    [name] 09/15/2018

## 2018-09-16 MED ORDER — OXYCODONE HCL 5 MG PO TABS: 5.0000 mg | ORAL_TABLET | Freq: Four times a day (QID) | ORAL | 0 refills | Status: DC | PRN

## 2018-09-16 MED ORDER — RIVAROXABAN 20 MG PO TABS
20.0000 mg | ORAL_TABLET | Freq: Every day | ORAL | Status: DC
  Administered 2018-09-16 – 2018-09-19 (×4): 20 mg via ORAL
  Filled 2018-09-16 (×4): qty 1

## 2018-09-16 NOTE — Progress Notes (Signed)
2 Days Post-Op   Subjective/Chief Complaint: Having moderate pain overnight   Objective: Vital signs in last 24 hours: Temp:  [97.1 F (36.2 C)-98.3 F (36.8 C)] 97.1 F (36.2 C) (01/10 0534) Pulse Rate:  [61-65] 61 (01/10 0534) Resp:  [16-18] 16 (01/10 0534) BP: (105-114)/(66-71) 114/66 (01/10 0534) SpO2:  [95 %-100 %] 95 % (01/10 0534) Last BM Date: 09/14/18  Intake/Output from previous day: 01/09 0701 - 01/10 0700 In: 2569.3 [P.O.:1460; I.V.:1109.3] Out: 1010 [Urine:900; Drains:110] Intake/Output this shift: No intake/output data recorded.  Exam: Awake and alert Abdomen soft, obese, drain serosang  Lab Results:  Recent Labs    09/15/18 0341  WBC 11.5*  HGB 11.7*  HCT 35.2*  PLT 259   BMET Recent Labs    09/15/18 0341  NA 136  K 4.5  CL 103  CO2 25  GLUCOSE 118*  BUN 7  CREATININE 0.88  CALCIUM 8.2*   PT/INR Recent Labs    09/14/18 1056  LABPROT 12.5  INR 0.95   ABG No results for input(s): PHART, HCO3 in the last 72 hours.  Invalid input(s): PCO2, PO2  Studies/Results: No results found.  Anti-infectives: Anti-infectives (From admission, onward)   Start     Dose/Rate Route Frequency Ordered Stop   09/14/18 1030  ciprofloxacin (CIPRO) IVPB 400 mg     400 mg 200 mL/hr over 60 Minutes Intravenous On call to O.R. 09/14/18 1028 09/14/18 1330   09/14/18 1025  ciprofloxacin (CIPRO) 400 MG/200ML IVPB    Note to Pharmacy:  [facility]   : cabinet override      09/14/18 1025 09/14/18 1330      Assessment/Plan: s/p Procedure(s): INCISIONAL HERNIA REPAIR WITH MESH, POTENTIAL A-CELL XENOGRAFT MESH ERAS PATHWAY (N/A) INSERTION OF MESH (N/A)  Resume Xeralto Ambulate Pain control Home next 24 to 48 hours  LOS: 2 days    Coralie Keens 09/16/2018

## 2018-09-16 NOTE — Plan of Care (Signed)
  Problem: Clinical Measurements: Goal: Ability to maintain clinical measurements within normal limits will improve Outcome: Progressing   Problem: Education: Goal: Knowledge of General Education information will improve Description Including pain rating scale, medication(s)/side effects and non-pharmacologic comfort measures Outcome: Progressing   Problem: Health Behavior/Discharge Planning: Goal: Ability to manage health-related needs will improve Outcome: Progressing   Problem: Clinical Measurements: Goal: Ability to maintain clinical measurements within normal limits will improve Outcome: Progressing Goal: Will remain free from infection Outcome: Progressing   Problem: Activity: Goal: Risk for activity intolerance will decrease Outcome: Progressing   Problem: Nutrition: Goal: Adequate nutrition will be maintained Outcome: Progressing   Problem: Coping: Goal: Level of anxiety will decrease Outcome: Progressing   Problem: Elimination: Goal: Will not experience complications related to urinary retention Outcome: Progressing   Problem: Pain Managment: Goal: General experience of comfort will improve Outcome: Progressing   Problem: Safety: Goal: Ability to remain free from injury will improve Outcome: Progressing   Problem: Skin Integrity: Goal: Risk for impaired skin integrity will decrease Outcome: Progressing

## 2018-09-16 NOTE — Discharge Instructions (Signed)
**No Tylenol before 5pm today.  Post Anesthesia Home Care Instructions  Activity: Get plenty of rest for the remainder of the day. A responsible individual must stay with you for 24 hours following the procedure.  For the next 24 hours, DO NOT: -Drive a car -Paediatric nurse -Drink alcoholic beverages -Take any medication unless instructed by your physician -Make any legal decisions or sign important papers.  Meals: Start with liquid foods such as gelatin or soup. Progress to regular foods as tolerated. Avoid greasy, spicy, heavy foods. If nausea and/or vomiting occur, drink only clear liquids until the nausea and/or vomiting subsides. Call your physician if vomiting continues.  Special Instructions/Symptoms: Your throat may feel dry or sore from the anesthesia or the breathing tube placed in your throat during surgery. If this causes discomfort, gargle with warm salt water. The discomfort should disappear within 24 hours.  If you had a scopolamine patch placed behind your ear for the management of post- operative nausea and/or vomiting:  1. The medication in the patch is effective for 72 hours, after which it should be removed.  Wrap patch in a tissue and discard in the trash. Wash hands thoroughly with soap and water. 2. You may remove the patch earlier than 72 hours if you experience unpleasant side effects which may include dry mouth, dizziness or visual disturbances. 3. Avoid touching the patch. Wash your hands with soap and water after contact with the patch.    [facility], [place] [phone]  OPEN ABDOMINAL SURGERY: POST OP INSTRUCTIONS  Always review your discharge instruction sheet given to you by the facility where your surgery was performed.  IF YOU HAVE DISABILITY OR FAMILY LEAVE FORMS, YOU MUST BRING THEM TO THE OFFICE FOR PROCESSING.  PLEASE DO NOT GIVE THEM TO YOUR DOCTOR.  1. A prescription for pain medication may be given to you upon discharge.   Take your pain medication as prescribed, if needed.  If narcotic pain medicine is not needed, then you may take acetaminophen (Tylenol) or ibuprofen (Advil) as needed. 2. Take your usually prescribed medications unless otherwise directed. 3. If you need a refill on your pain medication, please contact your pharmacy. They will contact our office to request authorization.  Prescriptions will not be filled after 5pm or on week-ends. 4. You should follow a light diet the first few days after arrival home, such as soup and crackers, pudding, etc.unless your doctor has advised otherwise. A high-fiber, low fat diet can be resumed as tolerated.   Be sure to include lots of fluids daily. Most patients will experience some swelling and bruising on the chest and neck area.  Ice packs will help.  Swelling and bruising can take several days to resolve 5. Most patients will experience some swelling and bruising in the area of the incision. Ice pack will help. Swelling and bruising can take several days to resolve..  6. It is common to experience some constipation if taking pain medication after surgery.  Increasing fluid intake and taking a stool softener will usually help or prevent this problem from occurring.  A mild laxative (Milk of Magnesia or Miralax) should be taken according to package directions if there are no bowel movements after 48 hours. 7.  You may have steri-strips (small skin tapes) in place directly over the incision.  These strips should be left on the skin for 7-10 days.  If your surgeon used skin glue on the incision, you may shower  in 24 hours.  The glue will flake off over the next 2-3 weeks.  Any sutures or staples will be removed at the office during your follow-up visit. You may find that a light gauze bandage over your incision may keep your staples from being rubbed or pulled. You may shower and replace the bandage daily. 8. ACTIVITIES:  You may resume regular (light) daily activities beginning  the next day--such as daily self-care, walking, climbing stairs--gradually increasing activities as tolerated.  You may have sexual intercourse when it is comfortable.  Refrain from any heavy lifting or straining until approved by your doctor. a. You may drive when you no longer are taking prescription pain medication, you can comfortably wear a seatbelt, and you can safely maneuver your car and apply brakes b. Return to Work: ___________________________________ 49. You should see your doctor in the office for a follow-up appointment approximately two weeks after your surgery.  Make sure that you call for this appointment within a day or two after you arrive home to insure a convenient appointment time. OTHER INSTRUCTIONS:  _____________________________________________________________ _____________________________________________________________  WHEN TO CALL YOUR DOCTOR: 1. Fever over 101.0 2. Inability to urinate 3. Nausea and/or vomiting 4. Extreme swelling or bruising 5. Continued bleeding from incision. 6. Increased pain, redness, or drainage from the incision. 7. Difficulty swallowing or breathing 8. Muscle cramping or spasms. 9. Numbness or tingling in hands or feet or around lips.  The clinic staff is available to answer your questions during regular business hours.  Please dont hesitate to call and ask to speak to one of the nurses if you have concerns.  For further questions, please visit [email or web address]      Information on my medicine - XARELTO (rivaroxaban)  WHY WAS XARELTO PRESCRIBED FOR YOU? Xarelto was prescribed to treat blood clots that may have been found in the veins of your legs (deep vein thrombosis) or in your lungs (pulmonary embolism) and to reduce the risk of them occurring again.  What do you need to know about Xarelto? The dose is one 20 mg tablet taken ONCE A DAY with your evening meal.  DO NOT stop taking Xarelto without talking to  the health care provider who prescribed the medication.  Refill your prescription for 20 mg tablets before you run out.  After discharge, you should have regular check-up appointments with your healthcare provider that is prescribing your Xarelto.  In the future your dose may need to be changed if your kidney function changes by a significant amount.  What do you do if you miss a dose? If you are taking Xarelto TWICE DAILY and you miss a dose, take it as soon as you remember. You may take two 15 mg tablets (total 30 mg) at the same time then resume your regularly scheduled 15 mg twice daily the next day.  If you are taking Xarelto ONCE DAILY and you miss a dose, take it as soon as you remember on the same day then continue your regularly scheduled once daily regimen the next day. Do not take two doses of Xarelto at the same time.   Important Safety Information Xarelto is a blood thinner medicine that can cause bleeding. You should call your healthcare provider right away if you experience any of the following: ? Bleeding from an injury or your nose that does not stop. ? Unusual colored urine (red or dark brown) or unusual colored stools (red or black). ? Unusual bruising for unknown reasons. ? A serious fall  or if you hit your head (even if there is no bleeding).  Some medicines may interact with Xarelto and might increase your risk of bleeding while on Xarelto. To help avoid this, consult your healthcare provider or pharmacist prior to using any new prescription or non-prescription medications, including herbals, vitamins, non-steroidal anti-inflammatory drugs (NSAIDs) and supplements.  This website has more information on Xarelto: [email or web address]

## 2018-09-17 NOTE — Progress Notes (Signed)
3 Days Post-Op   Subjective/Chief Complaint: Complains of abd pain. Reports flatus but no bm   Objective: Vital signs in last 24 hours: Temp:  [96.9 F (36.1 C)-98.4 F (36.9 C)] 98.4 F (36.9 C) (01/11 0610) Pulse Rate:  [57-66] 66 (01/11 0610) Resp:  [16-20] 20 (01/11 0610) BP: (90-135)/(64-87) 135/87 (01/11 0610) SpO2:  [91 %-97 %] 91 % (01/11 0610) Last BM Date: 09/14/18  Intake/Output from previous day: 01/10 0701 - 01/11 0700 In: 474 [P.O.:474] Out: 1235 [Urine:950; Drains:285] Intake/Output this shift: No intake/output data recorded.  General appearance: alert and cooperative Resp: clear to auscultation bilaterally Cardio: regular rate and rhythm GI: soft, moderate tenderness. good bs. incision looks ok  Lab Results:  Recent Labs    09/15/18 0341  WBC 11.5*  HGB 11.7*  HCT 35.2*  PLT 259   BMET Recent Labs    09/15/18 0341  NA 136  K 4.5  CL 103  CO2 25  GLUCOSE 118*  BUN 7  CREATININE 0.88  CALCIUM 8.2*   PT/INR Recent Labs    09/14/18 1056  LABPROT 12.5  INR 0.95   ABG No results for input(s): PHART, HCO3 in the last 72 hours.  Invalid input(s): PCO2, PO2  Studies/Results: No results found.  Anti-infectives: Anti-infectives (From admission, onward)   Start     Dose/Rate Route Frequency Ordered Stop   09/14/18 1030  ciprofloxacin (CIPRO) IVPB 400 mg     400 mg 200 mL/hr over 60 Minutes Intravenous On call to O.R. 09/14/18 1028 09/14/18 1330   09/14/18 1025  ciprofloxacin (CIPRO) 400 MG/200ML IVPB    Note to Pharmacy:  [facility]   : cabinet override      09/14/18 1025 09/14/18 1330      Assessment/Plan: s/p Procedure(s): INCISIONAL HERNIA REPAIR WITH MESH, POTENTIAL A-CELL XENOGRAFT MESH ERAS PATHWAY (N/A) INSERTION OF MESH (N/A) Advance diet  Continue drain Pt feels like she needs another day. Plan for discharge tomorrow POD 3  LOS: 3 days    [name] 09/17/2018

## 2018-09-18 LAB — CBC WITH DIFFERENTIAL/PLATELET
Abs Immature Granulocytes: 0.08 10*3/uL — ABNORMAL HIGH (ref 0.00–0.07)
Basophils Absolute: 0 10*3/uL (ref 0.0–0.1)
Basophils Relative: 0 %
EOS ABS: 0.5 10*3/uL (ref 0.0–0.5)
Eosinophils Relative: 6 %
HCT: 34.5 % — ABNORMAL LOW (ref 36.0–46.0)
Hemoglobin: 11.3 g/dL — ABNORMAL LOW (ref 12.0–15.0)
Immature Granulocytes: 1 %
Lymphocytes Relative: 31 %
Lymphs Abs: 2.8 10*3/uL (ref 0.7–4.0)
MCH: 31.7 pg (ref 26.0–34.0)
MCHC: 32.8 g/dL (ref 30.0–36.0)
MCV: 96.9 fL (ref 80.0–100.0)
Monocytes Absolute: 1.2 10*3/uL — ABNORMAL HIGH (ref 0.1–1.0)
Monocytes Relative: 14 %
Neutro Abs: 4.3 10*3/uL (ref 1.7–7.7)
Neutrophils Relative %: 48 %
Platelets: 203 10*3/uL (ref 150–400)
RBC: 3.56 MIL/uL — ABNORMAL LOW (ref 3.87–5.11)
RDW: 14.7 % (ref 11.5–15.5)
WBC: 9 10*3/uL (ref 4.0–10.5)
nRBC: 0.6 % — ABNORMAL HIGH (ref 0.0–0.2)

## 2018-09-18 LAB — BASIC METABOLIC PANEL
ANION GAP: 9 (ref 5–15)
BUN: 7 mg/dL (ref 6–20)
CALCIUM: 8.9 mg/dL (ref 8.9–10.3)
CO2: 26 mmol/L (ref 22–32)
Chloride: 101 mmol/L (ref 98–111)
Creatinine, Ser: 0.93 mg/dL (ref 0.44–1.00)
GFR calc Af Amer: >60 mL/min (ref >60)
GFR calc non Af Amer: >60 mL/min (ref >60)
Glucose, Bld: 90 mg/dL (ref 70–99)
Potassium: 4 mmol/L (ref 3.5–5.1)
Sodium: 136 mmol/L (ref 135–145)

## 2018-09-18 NOTE — Progress Notes (Signed)
4 Days Post-Op   Subjective/Chief Complaint: Says she had a terrible day yesterday. Having a lot of pain   Objective: Vital signs in last 24 hours: Temp:  [97.7 F (36.5 C)-98.8 F (37.1 C)] 98.4 F (36.9 C) (01/12 0605) Pulse Rate:  [58-73] 67 (01/12 0605) Resp:  [17-18] 18 (01/12 0605) BP: (105-125)/(65-83) 121/82 (01/12 0605) SpO2:  [95 %-97 %] 95 % (01/12 0605) Last BM Date: 09/16/18  Intake/Output from previous day: 01/11 0701 - 01/12 0700 In: -  Out: 950 [Urine:700; Drains:250] Intake/Output this shift: No intake/output data recorded.  General appearance: alert and cooperative Resp: clear to auscultation bilaterally Cardio: regular rate and rhythm GI: soft, mild tenderness. incision ok. drain intact  Lab Results:  No results for input(s): WBC, HGB, HCT, PLT in the last 72 hours. BMET No results for input(s): NA, K, CL, CO2, GLUCOSE, BUN, CREATININE, CALCIUM in the last 72 hours. PT/INR No results for input(s): LABPROT, INR in the last 72 hours. ABG No results for input(s): PHART, HCO3 in the last 72 hours.  Invalid input(s): PCO2, PO2  Studies/Results: No results found.  Anti-infectives: Anti-infectives (From admission, onward)   Start     Dose/Rate Route Frequency Ordered Stop   09/14/18 1030  ciprofloxacin (CIPRO) IVPB 400 mg     400 mg 200 mL/hr over 60 Minutes Intravenous On call to O.R. 09/14/18 1028 09/14/18 1330   09/14/18 1025  ciprofloxacin (CIPRO) 400 MG/200ML IVPB    Note to Pharmacy:  [facility]   : cabinet override      09/14/18 1025 09/14/18 1330      Assessment/Plan: s/p Procedure(s): INCISIONAL HERNIA REPAIR WITH MESH, POTENTIAL A-CELL XENOGRAFT MESH ERAS PATHWAY (N/A) INSERTION OF MESH (N/A) Advance diet  Continue drain Continue pain control Will check lytes and wbc Ambulate Hopefully home soon  LOS: 4 days    Autumn Messing III 09/18/2018

## 2018-09-18 NOTE — Plan of Care (Signed)

## 2018-09-19 NOTE — Discharge Summary (Signed)
Physician Discharge Summary  Patient ID: [name] MRN: [id number] DOB/AGE: [birth year] 38 y.o.  Admit date: 09/14/2018 Discharge date: 09/19/2018  Admission Diagnoses:  Discharge Diagnoses:  Active Problems:   Incisional hernia   Discharged Condition: good  Hospital Course: uneventful post op recovery.  Kept in hospital post op for pain control and monitoring drain output.  Improved daily.  Discharged home on POD#5 tolerating a diet and oral pain meds  Consults: None  Significant Diagnostic Studies:   Treatments: surgery: incisional hernia repair with mesh  Discharge Exam: Blood pressure 132/80, pulse 60, temperature 98.4 F (36.9 C), temperature source Oral, resp. rate 18, height 5\' 1"  (1.549 m), weight 111.6 kg, SpO2 100 %. General appearance: alert, cooperative and no distress Resp: clear to auscultation bilaterally Incision/Wound: abdomen soft, drain serosang, incision clean  Disposition: Discharge disposition: 01-Home or Self Care        Allergies as of 09/19/2018      Reactions   Penicillins Anaphylaxis, Hives, Rash   PATIENT HAD A PCN REACTION WITH IMMEDIATE RASH, FACIAL/TONGUE/THROAT SWELLING, SOB, OR LIGHTHEADEDNESS WITH HYPOTENSION:   YES  Reaction causing SEVERE RASH INVOLVING MUCUS MEMBRANES or SKIN NECROSIS: #  #  #  YES  #  #  #   PATIENT HAS HAD A PCN REACTION THAT REQUIRED HOSPITALIZATION:  #  #  #  YES  #  #  #  Has patient had a PCN reaction occurring within the last 10 years: No   Pollen Extract    UNSPECIFIED REACTION       Medication List    TAKE these medications   divalproex 500 MG DR tablet Commonly known as:  DEPAKOTE Take 1,000 mg by mouth at bedtime.   DULoxetine 30 MG capsule Commonly known as:  CYMBALTA Take 30 mg by mouth 2 (two) times daily.   levothyroxine 112 MCG tablet Commonly known as:  SYNTHROID, LEVOTHROID Take 112 mcg by mouth daily before breakfast.   lisinopril 5 MG tablet Commonly known as:   PRINIVIL,ZESTRIL Take 5 mg by mouth daily.   metoprolol tartrate 50 MG tablet Commonly known as:  LOPRESSOR Take 50 mg by mouth 2 (two) times daily.   oxyCODONE 5 MG immediate release tablet Commonly known as:  Oxy IR/ROXICODONE Take 1 tablet (5 mg total) by mouth every 6 (six) hours as needed for moderate pain.   XARELTO 20 MG Tabs tablet Generic drug:  rivaroxaban Take 20 mg by mouth every evening.   zolpidem 10 MG tablet Commonly known as:  AMBIEN Take 10 mg by mouth at bedtime.      Follow-up Information    Coralie Keens, MD. Schedule an appointment as soon as possible for a visit on 09/23/2018.   Specialty:  General Surgery Why:  staple and drain removal Contact information: [address] [place] [address] [phone]           Signed: Coralie Keens 09/19/2018, 8:27 AM

## 2018-09-19 NOTE — Progress Notes (Signed)
Windy Fast to be D/C'd  per MD order. Discussed with the patient and all questions fully answered.  VSS, Skin clean, dry and intact without evidence of skin break down, no evidence of skin tears noted.  IV catheter discontinued intact. Site without signs and symptoms of complications. Dressing and pressure applied.  An After Visit Summary was printed and given to the patient. Patient received prescription.  D/c education completed with patient/family including follow up instructions, medication list, d/c activities limitations if indicated, with other d/c instructions as indicated by MD - patient able to verbalize understanding, all questions fully answered.   Patient instructed to return to ED, call 911, or call MD for any changes in condition.   Patient to be escorted via [place], and D/C home via private auto.

## 2018-09-19 NOTE — Progress Notes (Signed)
Reviewed AVS discharge instructions with patient/caregiver. Patient/caregiver verbalizes understanding of instructions received. AVS and graduated drainage cup received by patient/caregiver. If present, telemetry box removed and central cardiac monitoring department notified of discharge. Peripheral IV removed, site benign with tip intact. Patient stated ride will be here in 10-15 minutes for pickup.

## 2018-09-23 ENCOUNTER — Observation Stay (HOSPITAL_COMMUNITY)
Admission: EM | Admit: 2018-09-23 | Discharge: 2018-09-25 | Disposition: A | Discharge status: Home / Self Care | Payer: Medicaid Other | Attending: Surgery | Admitting: Surgery

## 2018-09-23 DIAGNOSIS — X58XXXA Exposure to other specified factors, initial encounter: Secondary | ICD-10-CM | POA: Insufficient documentation

## 2018-09-23 DIAGNOSIS — I10 Essential (primary) hypertension: Secondary | ICD-10-CM | POA: Insufficient documentation

## 2018-09-23 DIAGNOSIS — T8130XA Disruption of wound, unspecified, initial encounter: Secondary | ICD-10-CM | POA: Diagnosis not present

## 2018-09-23 DIAGNOSIS — Z6841 Body Mass Index (BMI) 40.0 and over, adult: Secondary | ICD-10-CM | POA: Diagnosis not present

## 2018-09-23 MED ORDER — METOPROLOL TARTRATE 50 MG PO TABS
50.0000 mg | ORAL_TABLET | Freq: Two times a day (BID) | ORAL | Status: DC
  Administered 2018-09-23 – 2018-09-24 (×3): 50 mg via ORAL
  Filled 2018-09-23 (×3): qty 1

## 2018-09-23 MED ORDER — LISINOPRIL 5 MG PO TABS
5.0000 mg | ORAL_TABLET | Freq: Every day | ORAL | Status: DC
  Administered 2018-09-24: 5 mg via ORAL
  Filled 2018-09-23: qty 1

## 2018-09-23 MED ORDER — ONDANSETRON HCL 4 MG/2ML IJ SOLN: 4.0000 mg | Freq: Four times a day (QID) | INTRAMUSCULAR | Status: DC | PRN

## 2018-09-23 MED ORDER — METOPROLOL TARTRATE 5 MG/5ML IV SOLN: 5.0000 mg | Freq: Four times a day (QID) | INTRAVENOUS | Status: DC | PRN

## 2018-09-23 MED ORDER — ZOLPIDEM TARTRATE 5 MG PO TABS: 5.0000 mg | ORAL_TABLET | Freq: Every evening | ORAL | Status: DC | PRN

## 2018-09-23 MED ORDER — MORPHINE SULFATE (PF) 4 MG/ML IV SOLN
4.0000 mg | Freq: Once | INTRAVENOUS | Status: AC
  Administered 2018-09-23: 4 mg via INTRAMUSCULAR
  Filled 2018-09-23: qty 1

## 2018-09-23 MED ORDER — INSULIN ASPART 100 UNIT/ML ~~LOC~~ SOLN
0.0000 [IU] | Freq: Three times a day (TID) | SUBCUTANEOUS | Status: DC
  Administered 2018-09-24: 2 [IU] via SUBCUTANEOUS

## 2018-09-23 MED ORDER — OXYCODONE HCL 5 MG PO TABS
5.0000 mg | ORAL_TABLET | ORAL | Status: DC | PRN
  Administered 2018-09-23 – 2018-09-25 (×6): 5 mg via ORAL
  Filled 2018-09-23 (×6): qty 1

## 2018-09-23 MED ORDER — DULOXETINE HCL 30 MG PO CPEP
30.0000 mg | ORAL_CAPSULE | Freq: Every day | ORAL | Status: DC
  Administered 2018-09-24 – 2018-09-25 (×2): 30 mg via ORAL
  Filled 2018-09-23 (×2): qty 1

## 2018-09-23 MED ORDER — ENOXAPARIN SODIUM 40 MG/0.4ML ~~LOC~~ SOLN
40.0000 mg | Freq: Every day | SUBCUTANEOUS | Status: DC
  Administered 2018-09-24: 40 mg via SUBCUTANEOUS
  Filled 2018-09-23: qty 0.4

## 2018-09-23 MED ORDER — ACETAMINOPHEN 325 MG PO TABS
650.0000 mg | ORAL_TABLET | Freq: Four times a day (QID) | ORAL | Status: DC | PRN
  Administered 2018-09-24 – 2018-09-25 (×3): 650 mg via ORAL
  Filled 2018-09-23 (×4): qty 2

## 2018-09-23 MED ORDER — ACETAMINOPHEN 650 MG RE SUPP: 650.0000 mg | Freq: Four times a day (QID) | RECTAL | Status: DC | PRN

## 2018-09-23 MED ORDER — LEVOTHYROXINE SODIUM 100 MCG PO TABS
100.0000 ug | ORAL_TABLET | Freq: Every day | ORAL | Status: DC
  Administered 2018-09-24 – 2018-09-25 (×2): 100 ug via ORAL
  Filled 2018-09-23 (×2): qty 1

## 2018-09-23 MED ORDER — ONDANSETRON 4 MG PO TBDP: 4.0000 mg | ORAL_TABLET | Freq: Four times a day (QID) | ORAL | Status: DC | PRN

## 2018-09-23 NOTE — H&P (Signed)
Reason for Consult: wound dehiscence Referring Physician: [name] is an 38 y.o. female.  HPI: 38 yo female female 2 weeks out from open incisional hernia repair with overlay biologic mesh. She was in the office today for staple removal. When she got home she noticed the wound was draining. In looking at the wound it had opened up so she came to the emergency room.  Past Medical History:  Diagnosis Date  . Complication of anesthesia    "difficulty waking up and will fight when woken up"  . Depression   . Gallstones   . Hernia, ventral   . History of blood clots   . History of pulmonary embolus (PE)   . Hypertension   . Hyperthyroidism    01/15/17- in the past  . Shortness of breath dyspnea     Past Surgical History:  Procedure Laterality Date  . BREAST SURGERY     cyst removed from right  . CESAREAN SECTION    . CHOLECYSTECTOMY  01/18/2017  . CHOLECYSTECTOMY N/A 01/18/2017   Procedure: LAPAROSCOPIC CHOLECYSTECTOMY;  Surgeon: Lauren Keens, MD;  Location: [facility];  Service: General;  Laterality: N/A;  . HERNIA REPAIR    . INCISIONAL HERNIA REPAIR  04/13/2016  . INCISIONAL HERNIA REPAIR N/A 04/13/2016   Procedure: INCISIONAL HERNIA REPAIR;  Surgeon: Lauren Keens, MD;  Location: [place];  Service: General;  Laterality: N/A;  . [place]  09/14/2018  . INCISIONAL HERNIA REPAIR N/A 09/14/2018   Procedure: INCISIONAL HERNIA REPAIR WITH MESH, POTENTIAL A-CELL XENOGRAFT MESH ERAS PATHWAY;  Surgeon: Lauren Keens, MD;  Location: [place];  Service: General;  Laterality: N/A;  . INSERTION OF MESH N/A 04/13/2016   Procedure: INSERTION OF MESH;  Surgeon: Lauren Keens, MD;  Location: [facility];  Service: General;  Laterality: N/A;  . INSERTION OF MESH N/A 09/14/2018   Procedure: INSERTION OF MESH;  Surgeon: Lauren Keens, MD;  Location: [place];  Service: General;  Laterality: N/A;  . LAPAROTOMY N/A 03/05/2017   Procedure: EXPLORATORY LAPAROTOMY;  Surgeon:  Lauren Bookbinder, MD;  Location: [facility];  Service: General;  Laterality: N/A;  . LYSIS OF ADHESION N/A 03/05/2017   Procedure: LYSIS OF ADHESIONs;  Surgeon: Lauren Bookbinder, MD;  Location: [facility];  Service: General;  Laterality: N/A;  . PILONIDAL CYST / SINUS EXCISION    . removal of sweat gland Bilateral    arms    Family History  Problem Relation Age of Onset  . Hypertension Other   . Hyperlipidemia Other     Social History:  reports that she has never smoked. She has never used smokeless tobacco. She reports current alcohol use. She reports that she does not use drugs.  Allergies:  Allergies  Allergen Reactions  . Penicillins Anaphylaxis, Hives and Rash    PATIENT HAD A PCN REACTION WITH IMMEDIATE RASH, FACIAL/TONGUE/THROAT SWELLING, SOB, OR LIGHTHEADEDNESS WITH HYPOTENSION:   YES  Reaction causing SEVERE RASH INVOLVING MUCUS MEMBRANES or SKIN NECROSIS: #  #  #  YES  #  #  #   PATIENT HAS HAD A PCN REACTION THAT REQUIRED HOSPITALIZATION:  #  #  #  YES  #  #  #  Has patient had a PCN reaction occurring within the last 10 years: No  . Pollen Extract     UNSPECIFIED REACTION     Medications: I have reviewed the patient's current medications.  No results found for this or any previous visit (from  the past 48 hour(s)).  No results found.  Review of Systems  Constitutional: Negative for chills and fever.  HENT: Negative for hearing loss.   Eyes: Negative for blurred vision and double vision.  Respiratory: Negative for cough and hemoptysis.   Cardiovascular: Negative for chest pain and palpitations.  Gastrointestinal: Positive for abdominal pain. Negative for nausea and vomiting.  Genitourinary: Negative for dysuria and urgency.  Musculoskeletal: Negative for myalgias and neck pain.  Skin: Negative for itching and rash.  Neurological: Negative for dizziness, tingling and headaches.  Endo/Heme/Allergies: Does not bruise/bleed easily.  Psychiatric/Behavioral: Negative for  depression and suicidal ideas.   Blood pressure 117/71, pulse 65, temperature 97.6 F (36.4 C), temperature source Oral, resp. rate 16, height 5\' 1"  (1.549 m), weight 111.6 kg, SpO2 98 %. Physical Exam  Vitals reviewed. Constitutional: She is oriented to person, place, and time. She appears well-developed and well-nourished.  HENT:  Head: Normocephalic and atraumatic.  Eyes: Pupils are equal, round, and reactive to light. Conjunctivae and EOM are normal.  Neck: Normal range of motion. Neck supple.  Cardiovascular: Normal rate and regular rhythm.  Respiratory: Effort normal and breath sounds normal.  GI: Soft. Bowel sounds are normal. She exhibits no distension. There is no abdominal tenderness.  5cm x 12cm open area in the lower abdomen, no visible fascia or mesh.  Musculoskeletal: Normal range of motion.  Neurological: She is alert and oriented to person, place, and time.  Skin: Skin is warm and dry.  Psychiatric: She has a normal mood and affect. Her behavior is normal.      Assessment/Plan: 38 yo female with obesity, diabetes s/p hernia repair -admit to obs -wound care -ssi -carb mod diet  [name] 09/23/2018, 10:55 PM

## 2018-09-23 NOTE — ED Provider Notes (Signed)
Emergency Department Provider Note   I have reviewed the triage vital signs and the nursing notes.   HISTORY  Chief Complaint Wound Dehiscence   HPI [name] is a 38 y.o. female with PMH of incisional hernia s/p repair with Dr. [name] on 09/14/18 presents to the emergency department with wound dehiscence.  The patient has had a relatively uncomplicated postoperative course.  She followed up with the surgery service this afternoon to have the staples removed.  She denies any heavy lifting or sudden pain.  She noticed this evening that she was having some increased drainage from the area and decided to investigate under the dressing.  She found the wound to be dehisced and presented to the emergency department.  She is experiencing focal pain in the area.  No foul-smelling drainage or fevers.  No radiation of symptoms or other modifying factors.  Past Medical History:  Diagnosis Date  . Complication of anesthesia    "difficulty waking up and will fight when woken up"  . Depression   . Gallstones   . Hernia, ventral   . History of blood clots   . History of pulmonary embolus (PE)   . Hypertension   . Hyperthyroidism    01/15/17- in the past  . Shortness of breath dyspnea     Patient Active Problem List   Diagnosis Date Noted  . Wound dehiscence 09/23/2018  . Pre-operative cardiovascular examination 08/09/2018  . Left upper quadrant pain 03/03/2017  . Generalized abdominal wall pain 03/03/2017  . Obesity, Class III, BMI 40-49.9 (morbid obesity) ([place]) 03/03/2017  . Small bowel obstruction due to adhesions ([place]) 02/24/2017  . Essential hypertension 02/24/2017  . History of pulmonary embolism 02/24/2017  . Hypothyroidism 02/24/2017  . S/P laparoscopic cholecystectomy 01/18/2017  . Post op infection 05/03/2016  . Incisional hernia 04/13/2016    Past Surgical History:  Procedure Laterality Date  . BREAST SURGERY     cyst removed from right  . CESAREAN SECTION      . CHOLECYSTECTOMY  01/18/2017  . CHOLECYSTECTOMY N/A 01/18/2017   Procedure: LAPAROSCOPIC CHOLECYSTECTOMY;  Surgeon: Coralie Keens, MD;  Location: [facility];  Service: General;  Laterality: N/A;  . HERNIA REPAIR    . INCISIONAL HERNIA REPAIR  04/13/2016  . INCISIONAL HERNIA REPAIR N/A 04/13/2016   Procedure: INCISIONAL HERNIA REPAIR;  Surgeon: Coralie Keens, MD;  Location: [facility];  Service: General;  Laterality: N/A;  . [place]  09/14/2018  . INCISIONAL HERNIA REPAIR N/A 09/14/2018   Procedure: INCISIONAL HERNIA REPAIR WITH MESH, POTENTIAL A-CELL XENOGRAFT MESH ERAS PATHWAY;  Surgeon: Coralie Keens, MD;  Location: [facility];  Service: General;  Laterality: N/A;  . INSERTION OF MESH N/A 04/13/2016   Procedure: INSERTION OF MESH;  Surgeon: Coralie Keens, MD;  Location: [facility];  Service: General;  Laterality: N/A;  . INSERTION OF MESH N/A 09/14/2018   Procedure: INSERTION OF MESH;  Surgeon: Coralie Keens, MD;  Location: [facility];  Service: General;  Laterality: N/A;  . LAPAROTOMY N/A 03/05/2017   Procedure: EXPLORATORY LAPAROTOMY;  Surgeon: Rolm Bookbinder, MD;  Location: [facility];  Service: General;  Laterality: N/A;  . LYSIS OF ADHESION N/A 03/05/2017   Procedure: LYSIS OF ADHESIONs;  Surgeon: Rolm Bookbinder, MD;  Location: [facility];  Service: General;  Laterality: N/A;  . PILONIDAL CYST / SINUS EXCISION    . removal of sweat gland Bilateral    arms    Allergies Penicillins and Pollen extract  Family History  Problem Relation Age of Onset  . Hypertension Other   . Hyperlipidemia Other     Social History Social History   Tobacco Use  . Smoking status: Never Smoker  . Smokeless tobacco: Never Used  Substance Use Topics  . Alcohol use: Yes    Comment: occasionally  . Drug use: No    Review of Systems  Constitutional: No fever/chills Eyes: No visual changes. ENT: No sore throat. Cardiovascular: Denies chest pain. Respiratory: Denies shortness of  breath. Gastrointestinal: Positive abdominal pain.  No nausea, no vomiting.  No diarrhea.  No constipation. Genitourinary: Negative for dysuria. Musculoskeletal: Negative for back pain. Skin: Surgical wound opened.  Neurological: Negative for headaches, focal weakness or numbness.  10-point ROS otherwise negative.  ____________________________________________   PHYSICAL EXAM:  VITAL SIGNS: ED Triage Vitals  Enc Vitals Group     BP 09/23/18 2030 129/86     Pulse Rate 09/23/18 2030 66     Resp 09/23/18 2035 16     Temp 09/23/18 2035 97.6 F (36.4 C)     Temp Source 09/23/18 2035 Oral     SpO2 09/23/18 2030 100 %     Weight 09/23/18 2037 245 lb 15.8 oz (111.6 kg)     Height 09/23/18 2037 5\' 1"  (1.549 m)     Pain Score 09/23/18 2037 7   Constitutional: Alert and oriented. Well appearing and in no acute distress. Eyes: Conjunctivae are normal.  Head: Atraumatic. Nose: No congestion/rhinnorhea. Mouth/Throat: Mucous membranes are moist.   Neck: No stridor.   Cardiovascular: Normal rate, regular rhythm. Good peripheral circulation. Grossly normal heart sounds.   Respiratory: Normal respiratory effort.  No retractions. Lungs CTAB. Gastrointestinal: Soft and nontender. No distention.  Musculoskeletal: No lower extremity tenderness nor edema. No gross deformities of extremities. Neurologic:  Normal speech and language. No gross focal neurologic deficits are appreciated.  Skin:  Skin is warm and dry. Approximately 15 cm right incisional hernia scar with dehiscence.      ____________________________________________   LABS (all labs ordered are listed, but only abnormal results are displayed)  Labs Reviewed  CBC - Abnormal; Notable for the following components:      Result Value   RBC 3.70 (*)    Hemoglobin 11.9 (*)    HCT 35.4 (*)    All other components within normal limits  CBC  CREATININE, SERUM  GLUCOSE, CAPILLARY  HIV ANTIBODY (ROUTINE TESTING W REFLEX)    ____________________________________________   PROCEDURES  Procedure(s) performed:   Procedures  None ____________________________________________   INITIAL IMPRESSION / ASSESSMENT AND PLAN / ED COURSE  Pertinent labs & imaging results that were available during my care of the patient were reviewed by me and considered in my medical decision making (see chart for details).  Patient presents to the emergency department with wound dehiscence.  The image is pictured above.  I reached out to the general surgeon on-call who will come to evaluate the wound.  Morphine given for pain.  No labs or imaging at this time.   Discussed patient's case with Surgery, Dr. Kieth Brightly to request admission. Patient and family (if present) updated with plan. Care transferred to Surgery service.  I reviewed all nursing notes, vitals, pertinent old records, EKGs, labs, imaging (as available).  ____________________________________________  FINAL CLINICAL IMPRESSION(S) / ED DIAGNOSES  Final diagnoses:  Wound dehiscence     MEDICATIONS GIVEN DURING THIS VISIT:  Medications  enoxaparin (LOVENOX) injection 40 mg (has no administration in time range)  acetaminophen (TYLENOL)  tablet 650 mg (has no administration in time range)    Or  acetaminophen (TYLENOL) suppository 650 mg (has no administration in time range)  oxyCODONE (Oxy IR/ROXICODONE) immediate release tablet 5 mg (5 mg Oral Given 09/24/18 0841)  ondansetron (ZOFRAN-ODT) disintegrating tablet 4 mg (has no administration in time range)    Or  ondansetron (ZOFRAN) injection 4 mg (has no administration in time range)  metoprolol tartrate (LOPRESSOR) injection 5 mg (has no administration in time range)  metoprolol tartrate (LOPRESSOR) tablet 50 mg (50 mg Oral Given 09/23/18 2320)  zolpidem (AMBIEN) tablet 5 mg (has no administration in time range)  lisinopril (PRINIVIL,ZESTRIL) tablet 5 mg (has no administration in time range)  levothyroxine  (SYNTHROID, LEVOTHROID) tablet 100 mcg (100 mcg Oral Given 09/24/18 0534)  DULoxetine (CYMBALTA) DR capsule 30 mg (has no administration in time range)  insulin aspart (novoLOG) injection 0-15 Units (0 Units Subcutaneous Not Given 09/24/18 0842)  ibuprofen (ADVIL,MOTRIN) tablet 600 mg (600 mg Oral Given 09/24/18 0608)  morphine 4 MG/ML injection 4 mg (4 mg Intramuscular Given 09/23/18 2046)    Note:  This document was prepared using Dragon voice recognition software and may include unintentional dictation errors.  Nanda Quinton, MD Emergency Medicine    Melvern Ramone, [facility], MD 09/24/18 [phone]

## 2018-09-23 NOTE — ED Notes (Signed)
Wound packed with wet gauze and covered with abd pad.

## 2018-09-23 NOTE — ED Triage Notes (Signed)
Pt reports having hernia surgery on 09/14/18, reports they removed her stitches today and wound was not open but it was when she got home.

## 2018-09-24 LAB — CBC
HCT: 35.4 % — ABNORMAL LOW (ref 36.0–46.0)
Hemoglobin: 11.9 g/dL — ABNORMAL LOW (ref 12.0–15.0)
MCH: 32.2 pg (ref 26.0–34.0)
MCHC: 33.6 g/dL (ref 30.0–36.0)
MCV: 95.7 fL (ref 80.0–100.0)
Platelets: 357 10*3/uL (ref 150–400)
RBC: 3.7 MIL/uL — ABNORMAL LOW (ref 3.87–5.11)
RDW: 14.7 % (ref 11.5–15.5)
WBC: 6.4 10*3/uL (ref 4.0–10.5)
nRBC: 0 % (ref 0.0–0.2)

## 2018-09-24 LAB — HIV ANTIBODY (ROUTINE TESTING W REFLEX): HIV Screen 4th Generation wRfx: NONREACTIVE

## 2018-09-24 LAB — GLUCOSE, CAPILLARY
GLUCOSE-CAPILLARY: 127 mg/dL — AB (ref 70–99)
Glucose-Capillary: 92 mg/dL (ref 70–99)
Glucose-Capillary: 116 mg/dL — ABNORMAL HIGH (ref 70–99)
Glucose-Capillary: 111 mg/dL — ABNORMAL HIGH (ref 70–99)

## 2018-09-24 MED ORDER — IBUPROFEN 600 MG PO TABS
600.0000 mg | ORAL_TABLET | Freq: Four times a day (QID) | ORAL | Status: DC | PRN
  Administered 2018-09-24 – 2018-09-25 (×5): 600 mg via ORAL
  Filled 2018-09-24 (×5): qty 1

## 2018-09-24 NOTE — Progress Notes (Signed)
2300 received patient from ED. A&O x4, pain 7/10. Ambulated to bed. Will continue to monitor.

## 2018-09-24 NOTE — Progress Notes (Signed)
Patient ID: [name], female   DOB: 07-25-1981, 38 y.o.   MRN: [id number] [facility] Surgery Progress Note:   * No surgery found *  Subjective: Mental status is clear;   Objective: Vital signs in last 24 hours: Temp:  [97.6 F (36.4 C)-97.9 F (36.6 C)] 97.6 F (36.4 C) (01/18 0540) Pulse Rate:  [55-70] 55 (01/18 0540) Resp:  [16-19] 19 (01/18 0540) BP: (117-150)/(71-86) 124/74 (01/18 0540) SpO2:  [97 %-100 %] 99 % (01/18 0540) Weight:  [111.6 kg] 111.6 kg (01/17 2037)  Intake/Output from previous day: 01/17 0701 - 01/18 0700 In: 120 [P.O.:120] Out: -  Intake/Output this shift: Total I/O In: 120 [P.O.:120] Out: -   Physical Exam: Work of breathing is not labored.  Obese and lying on side;  Open wound is covered.  Will need assistance at home with dressing changes  Lab Results:  Results for orders placed or performed during the hospital encounter of 09/23/18 (from the past 48 hour(s))  CBC     Status: None   Collection Time: 09/23/18 10:30 PM  Result Value Ref Range   WBC 6.6 4.0 - 10.5 K/uL   RBC 3.96 3.87 - 5.11 MIL/uL   Hemoglobin 12.4 12.0 - 15.0 g/dL   HCT 38.9 36.0 - 46.0 %   MCV 98.2 80.0 - 100.0 fL   MCH 31.3 26.0 - 34.0 pg   MCHC 31.9 30.0 - 36.0 g/dL   RDW 14.6 11.5 - 15.5 %   Platelets 329 150 - 400 K/uL   nRBC 0.0 0.0 - 0.2 %    Comment: Performed at [facility], [place] [address] [place], [place] [address]  Creatinine, serum     Status: None   Collection Time: 09/23/18 10:30 PM  Result Value Ref Range   Creatinine, Ser 0.97 0.44 - 1.00 mg/dL   GFR calc non Af Amer >60 >60 mL/min   GFR calc Af Amer >60 >60 mL/min    Comment: Performed at [facility] [address], [place], [place] [address]  CBC     Status: Abnormal   Collection Time: 09/24/18  3:18 AM  Result Value Ref Range   WBC 6.4 4.0 - 10.5 K/uL   RBC 3.70 (L) 3.87 - 5.11 MIL/uL   Hemoglobin 11.9 (L) 12.0 - 15.0 g/dL   HCT 35.4 (L) 36.0 - 46.0 %   MCV 95.7 80.0 - 100.0  fL   MCH 32.2 26.0 - 34.0 pg   MCHC 33.6 30.0 - 36.0 g/dL   RDW 14.7 11.5 - 15.5 %   Platelets 357 150 - 400 K/uL   nRBC 0.0 0.0 - 0.2 %    Comment: Performed at [facility], [place] [address] [place], [place] [address]  Glucose, capillary     Status: None   Collection Time: 09/24/18  7:49 AM  Result Value Ref Range   Glucose-Capillary 92 70 - 99 mg/dL    Radiology/Results: No results found.  Anti-infectives: Anti-infectives (From admission, onward)   None      Assessment/Plan: Problem List: Patient Active Problem List   Diagnosis Date Noted  . Wound dehiscence 09/23/2018  . Pre-operative cardiovascular examination 08/09/2018  . Left upper quadrant pain 03/03/2017  . Generalized abdominal wall pain 03/03/2017  . Obesity, Class III, BMI 40-49.9 (morbid obesity) ([place]) 03/03/2017  . Small bowel obstruction due to adhesions ([place]) 02/24/2017  . Essential hypertension 02/24/2017  . History of pulmonary embolism 02/24/2017  . Hypothyroidism 02/24/2017  .  S/P laparoscopic cholecystectomy 01/18/2017  . Post op infection 05/03/2016  . Incisional hernia 04/13/2016    Could possilbly go home Saturday or Sunday if able to arrange home health nurse to perform dressing changes at home.   * No surgery found *    LOS: 1 day   Matt B. Hassell Done, MD, [facility] [phone] beeper [phone]  09/24/2018 9:45 AM

## 2018-09-25 ENCOUNTER — Observation Stay (HOSPITAL_COMMUNITY): Payer: Medicaid Other

## 2018-09-25 ENCOUNTER — Encounter (HOSPITAL_COMMUNITY): Payer: Self-pay | Admitting: Radiology

## 2018-09-25 DIAGNOSIS — I10 Essential (primary) hypertension: Secondary | ICD-10-CM | POA: Diagnosis not present

## 2018-09-25 DIAGNOSIS — Z6841 Body Mass Index (BMI) 40.0 and over, adult: Secondary | ICD-10-CM | POA: Diagnosis not present

## 2018-09-25 DIAGNOSIS — T8130XA Disruption of wound, unspecified, initial encounter: Secondary | ICD-10-CM | POA: Diagnosis not present

## 2018-09-25 LAB — GLUCOSE, CAPILLARY: Glucose-Capillary: 93 mg/dL (ref 70–99)

## 2018-09-25 LAB — CBC
HCT: 35.1 % — ABNORMAL LOW (ref 36.0–46.0)
Hemoglobin: 11.3 g/dL — ABNORMAL LOW (ref 12.0–15.0)
MCH: 31.6 pg (ref 26.0–34.0)
MCHC: 32.2 g/dL (ref 30.0–36.0)
MCV: 98 fL (ref 80.0–100.0)
Platelets: 355 10*3/uL (ref 150–400)
RBC: 3.58 MIL/uL — ABNORMAL LOW (ref 3.87–5.11)
RDW: 14.6 % (ref 11.5–15.5)
WBC: 6.8 10*3/uL (ref 4.0–10.5)
nRBC: 0 % (ref 0.0–0.2)

## 2018-09-25 MED ORDER — IPRATROPIUM-ALBUTEROL 0.5-2.5 (3) MG/3ML IN SOLN
3.0000 mL | Freq: Four times a day (QID) | RESPIRATORY_TRACT | Status: DC
  Administered 2018-09-25: 3 mL via RESPIRATORY_TRACT
  Filled 2018-09-25: qty 3

## 2018-09-25 MED ORDER — HYDROCODONE-ACETAMINOPHEN 5-325 MG PO TABS: 1.0000 | ORAL_TABLET | Freq: Four times a day (QID) | ORAL | 0 refills | Status: DC | PRN

## 2018-09-25 MED ORDER — IOPAMIDOL (ISOVUE-370) INJECTION 76%
INTRAVENOUS | Status: AC
  Administered 2018-09-25: 80 mL
  Filled 2018-09-25: qty 100

## 2018-09-25 MED ORDER — RIVAROXABAN 20 MG PO TABS
20.0000 mg | ORAL_TABLET | Freq: Every day | ORAL | Status: DC
  Administered 2018-09-25: 20 mg via ORAL
  Filled 2018-09-25: qty 1

## 2018-09-25 NOTE — Discharge Instructions (Signed)

## 2018-09-25 NOTE — Progress Notes (Signed)
Called for dyspnea, CT negative, restarted xarelto, discontinued enoxaparin, order PRN duoneb. Team to see in next few hours

## 2018-09-25 NOTE — Care Management Note (Signed)
Case Management Note  Patient Details  Name: [name] MRN: [id number] Date of Birth: 06-30-1981  Subjective/Objective:        Pt to return home alone to do wet-to-dry dressings BID or QD.              Action/Plan: Referral called to patient's first choice, [facility], who could not accept. Jermaine with  [facility] accepted referral.  Expected Discharge Date:  09/25/18               Expected Discharge Plan:  [place]  In-House Referral:  NA  Discharge planning Services  CM Consult  Post Acute Care Choice:  Home Health Choice offered to:  Patient  DME Arranged:  N/A DME Agency:     HH Arranged:  RN [place] Agency:  [facility]  Status of Service:  Completed, signed off  If discussed at [place] of Stay Meetings, dates discussed:    Additional Comments:  Claudie Leach, RN 09/25/2018, 11:25 AM

## 2018-09-25 NOTE — Discharge Summary (Signed)
Physician Discharge Summary  Patient ID: [name] MRN: [id number] DOB/AGE: [birth year] 38 y.o.  PCP: Berkley Harvey, NP  Admit date: 09/23/2018 Discharge date: 09/25/2018  Admission Diagnoses:  Superficial wound dehiscence in an obese patient  Discharge Diagnoses:  same  Active Problems:   Wound dehiscence   Surgery:  none  Discharged Condition: improved and stable  Hospital Course:   Was admitted Friday night after her staples were removed at the [place] office.  Her abdominal wound opened up and she presented to the ER where she was admitted.  Wet to dry dressing were prescribed.  Attempts to arrange home health were unsuccessful.  The patient had some anxiety and chest pain and with her history of DVT a CTA was arranged by Dr. Dema Severin.  This was negative for PE.  She is ready for discharg on this Sunday morning and wants to go home.  Wet to dry saline until office can arrange VAC dressing at home Consults: case management  Significant Diagnostic Studies: CTA negative for PE    Discharge Exam: Blood pressure (!) 104/59, pulse (!) 52, temperature 97.7 F (36.5 C), temperature source Oral, resp. rate 16, height 5\' 1"  (1.549 m), weight 111.6 kg, SpO2 100 %. Incision is open about 1 " across.    Disposition: Discharge disposition: 01-Home or Self Care       Discharge Instructions    Diet - low sodium heart healthy   Complete by:  As directed    Discharge instructions   Complete by:  As directed    Go home with wet to dry saline dressings.  Will get [facility] nurses to arrange home health care to manage wound including placement of VAC   Increase activity slowly   Complete by:  As directed      Allergies as of 09/25/2018      Reactions   Penicillins Anaphylaxis, Hives, Rash   PATIENT HAD A PCN REACTION WITH IMMEDIATE RASH, FACIAL/TONGUE/THROAT SWELLING, SOB, OR LIGHTHEADEDNESS WITH HYPOTENSION:   YES  Reaction causing SEVERE RASH INVOLVING MUCUS MEMBRANES or SKIN NECROSIS: #   #  #  YES  #  #  #   PATIENT HAS HAD A PCN REACTION THAT REQUIRED HOSPITALIZATION:  #  #  #  YES  #  #  #  Has patient had a PCN reaction occurring within the last 10 years: No   Pollen Extract    UNSPECIFIED REACTION       Medication List    TAKE these medications   divalproex 500 MG DR tablet Commonly known as:  DEPAKOTE Take 1,000 mg by mouth at bedtime.   DULoxetine 30 MG capsule Commonly known as:  CYMBALTA Take 30 mg by mouth 2 (two) times daily.   HYDROcodone-acetaminophen 5-325 MG tablet Commonly known as:  NORCO/VICODIN Take 1 tablet by mouth every 6 (six) hours as needed for moderate pain.   levothyroxine 112 MCG tablet Commonly known as:  SYNTHROID, LEVOTHROID Take 112 mcg by mouth daily before breakfast.   lisinopril 5 MG tablet Commonly known as:  PRINIVIL,ZESTRIL Take 5 mg by mouth daily.   metoprolol tartrate 50 MG tablet Commonly known as:  LOPRESSOR Take 50 mg by mouth 2 (two) times daily.   oxyCODONE 5 MG immediate release tablet Commonly known as:  Oxy IR/ROXICODONE Take 1 tablet (5 mg total) by mouth every 6 (six) hours as needed for moderate pain.   XARELTO 20 MG Tabs tablet Generic drug:  rivaroxaban Take 20 mg  by mouth every evening.   zolpidem 10 MG tablet Commonly known as:  AMBIEN Take 10 mg by mouth at bedtime.        Signed: Pedro Earls 09/25/2018, 9:49 AM

## 2018-09-25 NOTE — Progress Notes (Addendum)
Patient back to 6N from CT. Still complaining of chest pain and SOB on 2L, O2 sat 100%. Vital signs stable. Dr. Kieth Brightly paged to be notified. Awaiting orders.  0829 Administered Xarelto and Douneb as ordered. Patient states her chest pain has improved.This morning she states she felt chest tightness but not now. Will continue to monitor.

## 2018-09-25 NOTE — Progress Notes (Signed)
Patient discharged to home. Verbalizes understanding of all discharge instructions including incision care, discharge medications, and follow up MD visits. Patient will be followed by home health for dressing changes but states she is comfortable changing her dressing herself. Plenty of supplies provided to patient for dressing change. Patient transported by [employer].

## 2018-11-03 ENCOUNTER — Emergency Department (HOSPITAL_COMMUNITY)
Admission: EM | Admit: 2018-11-03 | Discharge: 2018-11-04 | Disposition: A | Discharge status: Home / Self Care | Payer: Medicaid Other | Attending: Emergency Medicine | Admitting: Emergency Medicine

## 2018-11-03 ENCOUNTER — Other Ambulatory Visit: Payer: Self-pay

## 2018-11-03 DIAGNOSIS — Z79899 Other long term (current) drug therapy: Secondary | ICD-10-CM | POA: Insufficient documentation

## 2018-11-03 DIAGNOSIS — Z86711 Personal history of pulmonary embolism: Secondary | ICD-10-CM | POA: Diagnosis not present

## 2018-11-03 DIAGNOSIS — L03311 Cellulitis of abdominal wall: Secondary | ICD-10-CM | POA: Diagnosis not present

## 2018-11-03 DIAGNOSIS — I10 Essential (primary) hypertension: Secondary | ICD-10-CM | POA: Diagnosis not present

## 2018-11-03 DIAGNOSIS — L7632 Postprocedural hematoma of skin and subcutaneous tissue following other procedure: Secondary | ICD-10-CM | POA: Diagnosis not present

## 2018-11-03 DIAGNOSIS — R1084 Generalized abdominal pain: Secondary | ICD-10-CM

## 2018-11-03 DIAGNOSIS — S301XXD Contusion of abdominal wall, subsequent encounter: Secondary | ICD-10-CM

## 2018-11-03 DIAGNOSIS — E669 Obesity, unspecified: Secondary | ICD-10-CM | POA: Diagnosis not present

## 2018-11-03 DIAGNOSIS — Z6841 Body Mass Index (BMI) 40.0 and over, adult: Secondary | ICD-10-CM | POA: Insufficient documentation

## 2018-11-03 LAB — COMPREHENSIVE METABOLIC PANEL
ALT: 16 U/L (ref 0–44)
AST: 27 U/L (ref 15–41)
Albumin: 3.8 g/dL (ref 3.5–5.0)
Alkaline Phosphatase: 69 U/L (ref 38–126)
Anion gap: 12 (ref 5–15)
BUN: 6 mg/dL (ref 6–20)
CO2: 21 mmol/L — ABNORMAL LOW (ref 22–32)
Calcium: 8.9 mg/dL (ref 8.9–10.3)
Chloride: 105 mmol/L (ref 98–111)
Creatinine, Ser: 1.03 mg/dL — ABNORMAL HIGH (ref 0.44–1.00)
GFR calc Af Amer: >60 mL/min (ref >60)
GFR calc non Af Amer: >60 mL/min (ref >60)
Glucose, Bld: 98 mg/dL (ref 70–99)
POTASSIUM: 3.7 mmol/L (ref 3.5–5.1)
Sodium: 138 mmol/L (ref 135–145)
Total Bilirubin: 0.5 mg/dL (ref 0.3–1.2)
Total Protein: 7.9 g/dL (ref 6.5–8.1)

## 2018-11-03 LAB — CBC
HCT: 38.8 % (ref 36.0–46.0)
HEMOGLOBIN: 12.6 g/dL (ref 12.0–15.0)
MCH: 32.7 pg (ref 26.0–34.0)
MCHC: 32.5 g/dL (ref 30.0–36.0)
MCV: 100.8 fL — ABNORMAL HIGH (ref 80.0–100.0)
NRBC: 0 % (ref 0.0–0.2)
Platelets: 393 10*3/uL (ref 150–400)
RBC: 3.85 MIL/uL — ABNORMAL LOW (ref 3.87–5.11)
RDW: 13.3 % (ref 11.5–15.5)
WBC: 9.6 10*3/uL (ref 4.0–10.5)

## 2018-11-03 LAB — I-STAT BETA HCG BLOOD, ED (MC, WL, AP ONLY): I-stat hCG, quantitative: 25.7 m[IU]/mL — ABNORMAL HIGH (ref <5)

## 2018-11-03 LAB — LIPASE, BLOOD: Lipase: 21 U/L (ref 11–51)

## 2018-11-03 MED ORDER — HYDROMORPHONE HCL 1 MG/ML IJ SOLN
0.5000 mg | Freq: Once | INTRAMUSCULAR | Status: AC
  Administered 2018-11-03: 0.5 mg via INTRAVENOUS
  Filled 2018-11-03: qty 1

## 2018-11-03 MED ORDER — ACETAMINOPHEN 500 MG PO TABS
1000.0000 mg | ORAL_TABLET | Freq: Once | ORAL | Status: AC
  Administered 2018-11-03: 1000 mg via ORAL
  Filled 2018-11-03: qty 2

## 2018-11-03 MED ORDER — SODIUM CHLORIDE 0.9% FLUSH: 3.0000 mL | Freq: Once | INTRAVENOUS | Status: DC

## 2018-11-03 MED ORDER — SODIUM CHLORIDE 0.9 % IV BOLUS
1000.0000 mL | Freq: Once | INTRAVENOUS | Status: AC
  Administered 2018-11-04: 1000 mL via INTRAVENOUS

## 2018-11-03 MED ORDER — ONDANSETRON HCL 4 MG/2ML IJ SOLN
4.0000 mg | Freq: Once | INTRAMUSCULAR | Status: AC
  Administered 2018-11-03: 4 mg via INTRAVENOUS
  Filled 2018-11-03: qty 2

## 2018-11-03 MED ORDER — SODIUM CHLORIDE 0.9 % IV BOLUS
1000.0000 mL | Freq: Once | INTRAVENOUS | Status: AC
  Administered 2018-11-03: 1000 mL via INTRAVENOUS

## 2018-11-03 NOTE — ED Triage Notes (Signed)
Pt here for persistent abdominal pain and fluid retention post surgery of a hernia repair. Pt states she's been seen many times since the January surgery and had fluid drawn off her abdomen. Went to PA today who was unable to draw any fluid off.  Complains of lower central abdominal pain.

## 2018-11-04 ENCOUNTER — Emergency Department (HOSPITAL_COMMUNITY): Payer: Medicaid Other

## 2018-11-04 LAB — URINALYSIS, ROUTINE W REFLEX MICROSCOPIC
Bilirubin Urine: NEGATIVE
Glucose, UA: NEGATIVE mg/dL
Hgb urine dipstick: NEGATIVE
Ketones, ur: NEGATIVE mg/dL
Leukocytes,Ua: NEGATIVE
Nitrite: NEGATIVE
PH: 6 (ref 5.0–8.0)
Protein, ur: NEGATIVE mg/dL
SPECIFIC GRAVITY, URINE: 1.013 (ref 1.005–1.030)

## 2018-11-04 LAB — POC URINE PREG, ED: Preg Test, Ur: NEGATIVE

## 2018-11-04 MED ORDER — IOHEXOL 300 MG/ML  SOLN
100.0000 mL | Freq: Once | INTRAMUSCULAR | Status: AC | PRN
  Administered 2018-11-04: 100 mL via INTRAVENOUS

## 2018-11-04 MED ORDER — CLINDAMYCIN HCL 150 MG PO CAPS: 300.0000 mg | ORAL_CAPSULE | Freq: Three times a day (TID) | ORAL | 0 refills | Status: DC

## 2018-11-04 MED ORDER — ONDANSETRON HCL 4 MG/2ML IJ SOLN
4.0000 mg | Freq: Once | INTRAMUSCULAR | Status: AC
  Administered 2018-11-04: 4 mg via INTRAVENOUS
  Filled 2018-11-04: qty 2

## 2018-11-04 MED ORDER — ONDANSETRON 4 MG PO TBDP: 4.0000 mg | ORAL_TABLET | Freq: Three times a day (TID) | ORAL | 0 refills | Status: AC | PRN

## 2018-11-04 MED ORDER — CLINDAMYCIN PHOSPHATE 600 MG/50ML IV SOLN
600.0000 mg | Freq: Once | INTRAVENOUS | Status: AC
  Administered 2018-11-04: 600 mg via INTRAVENOUS
  Filled 2018-11-04: qty 50

## 2018-11-04 MED ORDER — TRAMADOL HCL 50 MG PO TABS
50.0000 mg | ORAL_TABLET | Freq: Once | ORAL | Status: AC
  Administered 2018-11-04: 50 mg via ORAL
  Filled 2018-11-04: qty 1

## 2018-11-04 MED ORDER — ACETAMINOPHEN 325 MG PO TABS
650.0000 mg | ORAL_TABLET | Freq: Once | ORAL | Status: AC
  Administered 2018-11-04: 650 mg via ORAL
  Filled 2018-11-04: qty 2

## 2018-11-04 NOTE — ED Notes (Signed)
Patient verbalizes understanding of discharge instructions. Opportunity for questioning and answers were provided. Armband removed by staff, pt discharged from ED via wheelchair.  

## 2018-11-04 NOTE — ED Provider Notes (Signed)
[facility] EMERGENCY DEPARTMENT Provider Note   CSN: [id number] Arrival date & time: 11/03/18  1524    History   Chief Complaint Chief Complaint  Patient presents with  . Abdominal Pain    HPI [name] is a 38 y.o. female.     38 yo F with a cc of abdominal pain.  Patient had a hernia repair with mesh done on January 8 of this year.  Since then she had dehiscence of her wound and has had multiple visits to her general surgeon.  This was done by Dr. [name].  She states she has been seeing him about every week or so and he has been pulling fluid off of her abdomen.  She states that he sticks a needle into her abdominal wall and then pull some fluid off.  She actually saw him yesterday and he was only able to pull a small amount of fluid off and told her that she may need to come into the hospital to have a drain placed.  She was seen again today and since then her pain is gotten much worse.  She denies nausea or vomiting denies cough congestion or fever denies diarrhea.  Denies any urinary symptoms.  The history is provided by the patient.  Abdominal Pain  Pain location:  Generalized Pain quality: aching and sharp   Pain radiates to:  Does not radiate Pain severity:  Mild Onset quality:  Sudden Duration:  2 days Timing:  Constant Progression:  Worsening Chronicity:  New Relieved by:  Nothing Worsened by:  Nothing Ineffective treatments:  None tried Associated symptoms: no chest pain, no chills, no dysuria, no fever, no nausea, no shortness of breath and no vomiting     Past Medical History:  Diagnosis Date  . Complication of anesthesia    "difficulty waking up and will fight when woken up"  . Depression   . Gallstones   . Hernia, ventral   . History of blood clots   . History of pulmonary embolus (PE)   . Hypertension   . Hyperthyroidism    01/15/17- in the past  . Shortness of breath dyspnea     Patient Active Problem List   Diagnosis  Date Noted  . Wound dehiscence 09/23/2018  . Pre-operative cardiovascular examination 08/09/2018  . Left upper quadrant pain 03/03/2017  . Generalized abdominal wall pain 03/03/2017  . Obesity, Class III, BMI 40-49.9 (morbid obesity) ([place]) 03/03/2017  . Small bowel obstruction due to adhesions ([place]) 02/24/2017  . Essential hypertension 02/24/2017  . History of pulmonary embolism 02/24/2017  . Hypothyroidism 02/24/2017  . S/P laparoscopic cholecystectomy 01/18/2017  . Post op infection 05/03/2016  . Incisional hernia 04/13/2016    Past Surgical History:  Procedure Laterality Date  . BREAST SURGERY     cyst removed from right  . CESAREAN SECTION    . CHOLECYSTECTOMY  01/18/2017  . CHOLECYSTECTOMY N/A 01/18/2017   Procedure: LAPAROSCOPIC CHOLECYSTECTOMY;  Surgeon: Coralie Keens, MD;  Location: [facility];  Service: General;  Laterality: N/A;  . HERNIA REPAIR    . INCISIONAL HERNIA REPAIR  04/13/2016  . INCISIONAL HERNIA REPAIR N/A 04/13/2016   Procedure: INCISIONAL HERNIA REPAIR;  Surgeon: Coralie Keens, MD;  Location: [facility];  Service: General;  Laterality: N/A;  . [place]  09/14/2018  . INCISIONAL HERNIA REPAIR N/A 09/14/2018   Procedure: INCISIONAL HERNIA REPAIR WITH MESH, POTENTIAL A-CELL XENOGRAFT MESH ERAS PATHWAY;  Surgeon: Coralie Keens, MD;  Location: [facility];  Service: General;  Laterality: N/A;  . INSERTION OF MESH N/A 04/13/2016   Procedure: INSERTION OF MESH;  Surgeon: Coralie Keens, MD;  Location: [facility];  Service: General;  Laterality: N/A;  . INSERTION OF MESH N/A 09/14/2018   Procedure: INSERTION OF MESH;  Surgeon: Coralie Keens, MD;  Location: [facility];  Service: General;  Laterality: N/A;  . LAPAROTOMY N/A 03/05/2017   Procedure: EXPLORATORY LAPAROTOMY;  Surgeon: Rolm Bookbinder, MD;  Location: [facility];  Service: General;  Laterality: N/A;  . LYSIS OF ADHESION N/A 03/05/2017   Procedure: LYSIS OF ADHESIONs;  Surgeon: Rolm Bookbinder, MD;  Location:  [facility];  Service: General;  Laterality: N/A;  . PILONIDAL CYST / SINUS EXCISION    . removal of sweat gland Bilateral    arms     OB History   No obstetric history on file.      Home Medications    Prior to Admission medications   Medication Sig Start Date End Date Taking? Authorizing Provider  divalproex (DEPAKOTE) 500 MG DR tablet Take 1,000 mg by mouth at bedtime.  06/28/18   [provider]  DULoxetine (CYMBALTA) 30 MG capsule Take 30 mg by mouth 2 (two) times daily. 06/28/18   [provider]  HYDROcodone-acetaminophen (NORCO/VICODIN) 5-325 MG tablet Take 1 tablet by mouth every 6 (six) hours as needed for moderate pain. 09/25/18   Johnathan Hausen, MD  levothyroxine (SYNTHROID, LEVOTHROID) 112 MCG tablet Take 112 mcg by mouth daily before breakfast.  07/15/17   [provider]  lisinopril (PRINIVIL,ZESTRIL) 5 MG tablet Take 5 mg by mouth daily. 11/11/15   [provider]  metoprolol (LOPRESSOR) 50 MG tablet Take 50 mg by mouth 2 (two) times daily. 09/12/15   [provider]  oxyCODONE (OXY IR/ROXICODONE) 5 MG immediate release tablet Take 1 tablet (5 mg total) by mouth every 6 (six) hours as needed for moderate pain. 09/16/18   Coralie Keens, MD  Rivaroxaban (XARELTO) 20 MG TABS Take 20 mg by mouth every evening.     [provider]  zolpidem (AMBIEN) 10 MG tablet Take 10 mg by mouth at bedtime. 06/28/18   [provider]    Family History Family History  Problem Relation Age of Onset  . Hypertension Other   . Hyperlipidemia Other     Social History Social History   Tobacco Use  . Smoking status: Never Smoker  . Smokeless tobacco: Never Used  Substance Use Topics  . Alcohol use: Yes    Comment: occasionally  . Drug use: No     Allergies   Penicillins and Pollen extract   Review of Systems Review of Systems  Constitutional: Negative for chills and fever.  HENT: Negative for congestion and rhinorrhea.    Eyes: Negative for redness and visual disturbance.  Respiratory: Negative for shortness of breath and wheezing.   Cardiovascular: Negative for chest pain and palpitations.  Gastrointestinal: Positive for abdominal pain. Negative for nausea and vomiting.  Genitourinary: Negative for dysuria and urgency.  Musculoskeletal: Negative for arthralgias and myalgias.  Skin: Negative for pallor and wound.  Neurological: Negative for dizziness and headaches.     Physical Exam Updated Vital Signs BP (!) 158/81   Pulse (!) 105   Temp (!) 100.4 F (38 C) (Oral)   Resp 18   Ht 5\' 1"  (1.549 m)   Wt 109.3 kg   SpO2 100%   BMI 45.54 kg/m   Physical Exam Vitals signs and nursing note reviewed.  Constitutional:      General: She is not in acute distress.    Appearance: She is well-developed. She is obese. She is not diaphoretic.  HENT:     Head: Normocephalic and atraumatic.  Eyes:     Pupils: Pupils are equal, round, and reactive to light.  Neck:     Musculoskeletal: Normal range of motion and neck supple.  Cardiovascular:     Rate and Rhythm: Regular rhythm. Tachycardia present.     Heart sounds: No murmur. No friction rub. No gallop.   Pulmonary:     Effort: Pulmonary effort is normal.     Breath sounds: No wheezing or rales.  Abdominal:     General: There is no distension.     Palpations: Abdomen is soft.     Tenderness: There is abdominal tenderness (diffuse).     Comments: Midline abdominal scar, wound is well healed and no longer open, shallow base with mild granulation tissue.  No erythema fluctuance or discharge.  Musculoskeletal:        General: No tenderness.  Skin:    General: Skin is warm and dry.  Neurological:     Mental Status: She is alert and oriented to person, place, and time.  Psychiatric:        Behavior: Behavior normal.      ED Treatments / Results  Labs (all labs ordered are listed, but only abnormal results are displayed) Labs Reviewed    COMPREHENSIVE METABOLIC PANEL - Abnormal; Notable for the following components:      Result Value   CO2 21 (*)    Creatinine, Ser 1.03 (*)    All other components within normal limits  CBC - Abnormal; Notable for the following components:   RBC 3.85 (*)    MCV 100.8 (*)    All other components within normal limits  I-STAT BETA HCG BLOOD, ED ([facility], [facility], [facility] ONLY) - Abnormal; Notable for the following components:   I-stat hCG, quantitative 25.7 (*)    All other components within normal limits  LIPASE, BLOOD  URINALYSIS, ROUTINE W REFLEX MICROSCOPIC  POC URINE PREG, ED    EKG None  Radiology No results found.  Procedures Procedures (including critical care time)  Medications Ordered in ED Medications  sodium chloride flush (NS) 0.9 % injection 3 mL (3 mLs Intravenous Not Given 11/03/18 2027)  acetaminophen (TYLENOL) tablet 1,000 mg (1,000 mg Oral Given 11/03/18 2106)  HYDROmorphone (DILAUDID) injection 0.5 mg (0.5 mg Intravenous Given 11/03/18 2106)  ondansetron (ZOFRAN) injection 4 mg (4 mg Intravenous Given 11/03/18 2106)  sodium chloride 0.9 % bolus 1,000 mL (0 mLs Intravenous Stopped 11/03/18 2314)  sodium chloride 0.9 % bolus 1,000 mL (1,000 mLs Intravenous New Bag/Given 11/04/18 0007)     Initial Impression / Assessment and Plan / ED Course  I have reviewed the triage vital signs and the nursing notes.  Pertinent labs & imaging results that were available during my care of the patient were reviewed by me and considered in my medical decision making (see chart for details).        38 yo F with a chief complaint of diffuse abdominal pain.  This is been going on since she had a hernia repair done about a month ago.  She feels that there is some fluid there.  She is noted to be febrile here.  I-STAT hCG with very trivial elevation, she denies any possibility of being pregnant.  Will obtain a CT scan of the abdomen  pelvis with contrast to further evaluate her abdominal tenderness.   Likely will need to discuss with general surgery as she is recently postop and saw them in the office today and yesterday. Turned over to Dr. Leonette Monarch, please see his note for further details of care.   The patients results and plan were reviewed and discussed.   Any x-rays performed were independently reviewed by myself.   Differential diagnosis were considered with the presenting HPI.  Medications  sodium chloride flush (NS) 0.9 % injection 3 mL (3 mLs Intravenous Not Given 11/03/18 2027)  acetaminophen (TYLENOL) tablet 1,000 mg (1,000 mg Oral Given 11/03/18 2106)  HYDROmorphone (DILAUDID) injection 0.5 mg (0.5 mg Intravenous Given 11/03/18 2106)  ondansetron (ZOFRAN) injection 4 mg (4 mg Intravenous Given 11/03/18 2106)  sodium chloride 0.9 % bolus 1,000 mL (0 mLs Intravenous Stopped 11/03/18 2314)  sodium chloride 0.9 % bolus 1,000 mL (1,000 mLs Intravenous New Bag/Given 11/04/18 0007)    Vitals:   11/03/18 2034 11/03/18 2112 11/03/18 2115 11/03/18 2145  BP: (!) 141/91  138/80 (!) 158/81  Pulse: (!) 118   (!) 105  Resp: (!) 26  (!) 24 18  Temp: (!) 100.4 F (38 C)     TempSrc: Oral     SpO2: 98%   100%  Weight:  109.3 kg    Height:  5\' 1"  (1.549 m)      Final diagnoses:  Generalized abdominal pain      Final Clinical Impressions(s) / ED Diagnoses   Final diagnoses:  Generalized abdominal pain    ED Discharge Orders    None       Deno Etienne, DO 11/04/18 0010

## 2018-11-04 NOTE — ED Provider Notes (Signed)
I assumed care of this patient from Dr. Tyrone Nine at 0000.  Please see their note for further details of Hx, PE.  Briefly patient is a 38 y.o. female with a history of postoperative seroma that is been intermittently drained by surgery, presented with abdominal pain after needle aspiration of the seroma attempt.  She was found to be febrile.  Labs reassuring without leukocytosis.  Plan is to follow-up on CT scan and discussed with surgery  CT scan revealed abdominal wall cellulitis.  No other intra-abdominal inflammatory/infectious process.  Case discussed with Dr. Georgette Dover, who recommended outpatient management with antibiotics and close follow-up with Dr. [name].  Given first dose of clindamycin here in the emergency department.  Patient is able to tolerate oral intake.  The patient appears reasonably screened and/or stabilized for discharge and I doubt any other medical condition or other [facility] requiring further screening, evaluation, or treatment in the ED at this time prior to discharge.  The patient is safe for discharge with strict return precautions.  Disposition: Discharge  Condition: Good  I have discussed the results, Dx and Tx plan with the patient who expressed understanding and agree(s) with the plan. Discharge instructions discussed at great length. The patient was given strict return precautions who verbalized understanding of the instructions. No further questions at time of discharge.    ED Discharge Orders         Ordered    clindamycin (CLEOCIN) 150 MG capsule  3 times daily     11/04/18 0451           Follow Up: Coralie Keens, MD [place] [address] [phone]  Schedule an appointment as soon as possible for a visit today For close follow up to assess for abdominal wall skin infection      Leonor Darnell, Grayce Sessions, MD 11/04/18 [phone]

## 2018-11-04 NOTE — ED Notes (Signed)
Pt assisted with bedpan. Trembling when this RN assisted, states she feels like she is developing a fever and that her abdomen is painful again. Temp 99.6 orally; medication requested from MD.

## 2018-11-08 ENCOUNTER — Other Ambulatory Visit: Payer: Self-pay

## 2018-11-08 ENCOUNTER — Inpatient Hospital Stay (HOSPITAL_COMMUNITY)
Admission: AD | Admit: 2018-11-08 | Discharge: 2018-11-15 | DRG: 920 | Disposition: A | Discharge status: Home / Self Care | Payer: Medicaid Other | Attending: Surgery | Admitting: Surgery

## 2018-11-08 ENCOUNTER — Other Ambulatory Visit: Payer: Self-pay | Admitting: Surgery

## 2018-11-08 DIAGNOSIS — Z9049 Acquired absence of other specified parts of digestive tract: Secondary | ICD-10-CM

## 2018-11-08 DIAGNOSIS — Z86711 Personal history of pulmonary embolism: Secondary | ICD-10-CM | POA: Diagnosis not present

## 2018-11-08 DIAGNOSIS — L7634 Postprocedural seroma of skin and subcutaneous tissue following other procedure: Secondary | ICD-10-CM | POA: Diagnosis present

## 2018-11-08 DIAGNOSIS — R109 Unspecified abdominal pain: Secondary | ICD-10-CM | POA: Diagnosis present

## 2018-11-08 DIAGNOSIS — Z6841 Body Mass Index (BMI) 40.0 and over, adult: Secondary | ICD-10-CM

## 2018-11-08 DIAGNOSIS — B958 Unspecified staphylococcus as the cause of diseases classified elsewhere: Secondary | ICD-10-CM | POA: Diagnosis present

## 2018-11-08 DIAGNOSIS — Z880 Allergy status to penicillin: Secondary | ICD-10-CM | POA: Diagnosis not present

## 2018-11-08 DIAGNOSIS — S301XXA Contusion of abdominal wall, initial encounter: Secondary | ICD-10-CM

## 2018-11-08 DIAGNOSIS — Z79890 Hormone replacement therapy (postmenopausal): Secondary | ICD-10-CM | POA: Diagnosis not present

## 2018-11-08 DIAGNOSIS — Z7901 Long term (current) use of anticoagulants: Secondary | ICD-10-CM

## 2018-11-08 DIAGNOSIS — L03311 Cellulitis of abdominal wall: Secondary | ICD-10-CM | POA: Diagnosis present

## 2018-11-08 DIAGNOSIS — Z8249 Family history of ischemic heart disease and other diseases of the circulatory system: Secondary | ICD-10-CM | POA: Diagnosis not present

## 2018-11-08 DIAGNOSIS — Z79891 Long term (current) use of opiate analgesic: Secondary | ICD-10-CM

## 2018-11-08 DIAGNOSIS — Y838 Other surgical procedures as the cause of abnormal reaction of the patient, or of later complication, without mention of misadventure at the time of the procedure: Secondary | ICD-10-CM | POA: Diagnosis present

## 2018-11-08 DIAGNOSIS — Z86718 Personal history of other venous thrombosis and embolism: Secondary | ICD-10-CM

## 2018-11-08 DIAGNOSIS — Z79899 Other long term (current) drug therapy: Secondary | ICD-10-CM | POA: Diagnosis not present

## 2018-11-08 DIAGNOSIS — S301XXS Contusion of abdominal wall, sequela: Secondary | ICD-10-CM

## 2018-11-08 DIAGNOSIS — Z8349 Family history of other endocrine, nutritional and metabolic diseases: Secondary | ICD-10-CM | POA: Diagnosis not present

## 2018-11-08 LAB — COMPREHENSIVE METABOLIC PANEL
ALT: 20 U/L (ref 0–44)
AST: 20 U/L (ref 15–41)
Albumin: 2.7 g/dL — ABNORMAL LOW (ref 3.5–5.0)
Alkaline Phosphatase: 76 U/L (ref 38–126)
Anion gap: 11 (ref 5–15)
BUN: 5 mg/dL — AB (ref 6–20)
CALCIUM: 8.7 mg/dL — AB (ref 8.9–10.3)
CO2: 26 mmol/L (ref 22–32)
Chloride: 98 mmol/L (ref 98–111)
Creatinine, Ser: 0.94 mg/dL (ref 0.44–1.00)
GFR calc Af Amer: >60 mL/min (ref >60)
GFR calc non Af Amer: >60 mL/min (ref >60)
Glucose, Bld: 117 mg/dL — ABNORMAL HIGH (ref 70–99)
Potassium: 3.2 mmol/L — ABNORMAL LOW (ref 3.5–5.1)
Sodium: 135 mmol/L (ref 135–145)
Total Bilirubin: 0.6 mg/dL (ref 0.3–1.2)
Total Protein: 7 g/dL (ref 6.5–8.1)

## 2018-11-08 LAB — CBC WITH DIFFERENTIAL/PLATELET
Abs Immature Granulocytes: 0.11 10*3/uL — ABNORMAL HIGH (ref 0.00–0.07)
Basophils Absolute: 0 10*3/uL (ref 0.0–0.1)
Basophils Relative: 0 %
Eosinophils Absolute: 0.3 10*3/uL (ref 0.0–0.5)
Eosinophils Relative: 2 %
HCT: 31.3 % — ABNORMAL LOW (ref 36.0–46.0)
Hemoglobin: 10.1 g/dL — ABNORMAL LOW (ref 12.0–15.0)
Immature Granulocytes: 1 %
Lymphocytes Relative: 10 %
Lymphs Abs: 1.4 10*3/uL (ref 0.7–4.0)
MCH: 31.4 pg (ref 26.0–34.0)
MCHC: 32.3 g/dL (ref 30.0–36.0)
MCV: 97.2 fL (ref 80.0–100.0)
Monocytes Absolute: 1 10*3/uL (ref 0.1–1.0)
Monocytes Relative: 6 %
Neutro Abs: 12.3 10*3/uL — ABNORMAL HIGH (ref 1.7–7.7)
Neutrophils Relative %: 81 %
Platelets: 349 10*3/uL (ref 150–400)
RBC: 3.22 MIL/uL — AB (ref 3.87–5.11)
RDW: 13.2 % (ref 11.5–15.5)
WBC: 15.1 10*3/uL — ABNORMAL HIGH (ref 4.0–10.5)
nRBC: 0 % (ref 0.0–0.2)

## 2018-11-08 LAB — PROTIME-INR
INR: 1.2 (ref 0.8–1.2)
Prothrombin Time: 14.9 seconds (ref 11.4–15.2)

## 2018-11-08 LAB — APTT: aPTT: 34 seconds (ref 24–36)

## 2018-11-08 MED ORDER — POTASSIUM CHLORIDE IN NACL 20-0.9 MEQ/L-% IV SOLN
INTRAVENOUS | Status: DC
  Administered 2018-11-08 – 2018-11-10 (×4): via INTRAVENOUS
  Administered 2018-11-11: 1 mL via INTRAVENOUS
  Administered 2018-11-13: 10:00:00 via INTRAVENOUS
  Filled 2018-11-08 (×7): qty 1000

## 2018-11-08 MED ORDER — ACETAMINOPHEN 650 MG RE SUPP: 650.0000 mg | Freq: Four times a day (QID) | RECTAL | Status: DC | PRN

## 2018-11-08 MED ORDER — HYDROMORPHONE HCL 2 MG/ML IJ SOLN: 1.0000 mg | INTRAMUSCULAR | Status: AC | PRN

## 2018-11-08 MED ORDER — OXYCODONE HCL 5 MG PO TABS: 5.0000 mg | ORAL_TABLET | ORAL | Status: DC | PRN

## 2018-11-08 MED ORDER — ONDANSETRON 4 MG PO TBDP: 4.0000 mg | ORAL_TABLET | Freq: Four times a day (QID) | ORAL | Status: DC | PRN

## 2018-11-08 MED ORDER — ONDANSETRON HCL 4 MG/2ML IJ SOLN
4.0000 mg | Freq: Four times a day (QID) | INTRAMUSCULAR | Status: DC | PRN
  Administered 2018-11-08 – 2018-11-12 (×10): 4 mg via INTRAVENOUS
  Filled 2018-11-08 (×11): qty 2

## 2018-11-08 MED ORDER — ACETAMINOPHEN 325 MG PO TABS: 650.0000 mg | ORAL_TABLET | Freq: Four times a day (QID) | ORAL | Status: DC | PRN

## 2018-11-08 MED ORDER — POTASSIUM CHLORIDE IN NACL 20-0.9 MEQ/L-% IV SOLN: INTRAVENOUS | Status: AC

## 2018-11-08 MED ORDER — ACETAMINOPHEN 325 MG PO TABS: 650.0000 mg | ORAL_TABLET | Freq: Four times a day (QID) | ORAL | Status: AC | PRN

## 2018-11-08 MED ORDER — OXYCODONE HCL 5 MG PO TABS
5.0000 mg | ORAL_TABLET | ORAL | Status: DC | PRN
  Administered 2018-11-08 – 2018-11-15 (×9): 10 mg via ORAL
  Filled 2018-11-08 (×10): qty 2

## 2018-11-08 MED ORDER — ONDANSETRON 4 MG PO TBDP
4.0000 mg | ORAL_TABLET | Freq: Four times a day (QID) | ORAL | Status: DC | PRN
  Administered 2018-11-08: 4 mg via ORAL
  Filled 2018-11-08: qty 1

## 2018-11-08 MED ORDER — ACETAMINOPHEN 650 MG RE SUPP: 650.0000 mg | Freq: Four times a day (QID) | RECTAL | Status: AC | PRN

## 2018-11-08 MED ORDER — MORPHINE SULFATE (PF) 2 MG/ML IV SOLN
1.0000 mg | INTRAVENOUS | Status: DC | PRN
  Administered 2018-11-08 – 2018-11-09 (×5): 2 mg via INTRAVENOUS
  Administered 2018-11-09 (×2): 4 mg via INTRAVENOUS
  Administered 2018-11-09 – 2018-11-10 (×2): 2 mg via INTRAVENOUS
  Filled 2018-11-08 (×2): qty 2
  Filled 2018-11-08 (×4): qty 1
  Filled 2018-11-08: qty 2
  Filled 2018-11-08: qty 1
  Filled 2018-11-08 (×2): qty 2

## 2018-11-08 MED ORDER — SODIUM CHLORIDE 0.9 % IV SOLN: 4.0000 mg | Freq: Four times a day (QID) | INTRAVENOUS | Status: DC | PRN

## 2018-11-08 NOTE — H&P (Signed)
[name] is an 38 y.o. female.   Chief Complaint: abdominal pain HPI: [name] is s/p incisional hernia repair with A-cell biologic mesh on 09/14/2018.  She had been doing well.  Her drain unfortunately fell out at home.  I have intermittently had to aspirate seroma fluid.  She now presents with abdominal wall cellulitis and pain with fever.  Past Medical History:  Diagnosis Date  . Complication of anesthesia    "difficulty waking up and will fight when woken up"  . Depression   . Gallstones   . Hernia, ventral   . History of blood clots   . History of pulmonary embolus (PE)   . Hypertension   . Hyperthyroidism    01/15/17- in the past  . Shortness of breath dyspnea     Past Surgical History:  Procedure Laterality Date  . BREAST SURGERY     cyst removed from right  . CESAREAN SECTION    . CHOLECYSTECTOMY  01/18/2017  . CHOLECYSTECTOMY N/A 01/18/2017   Procedure: LAPAROSCOPIC CHOLECYSTECTOMY;  Surgeon: Coralie Keens, MD;  Location: [facility];  Service: General;  Laterality: N/A;  . HERNIA REPAIR    . INCISIONAL HERNIA REPAIR  04/13/2016  . INCISIONAL HERNIA REPAIR N/A 04/13/2016   Procedure: INCISIONAL HERNIA REPAIR;  Surgeon: Coralie Keens, MD;  Location: [facility];  Service: General;  Laterality: N/A;  . [place]  09/14/2018  . INCISIONAL HERNIA REPAIR N/A 09/14/2018   Procedure: INCISIONAL HERNIA REPAIR WITH MESH, POTENTIAL A-CELL XENOGRAFT MESH ERAS PATHWAY;  Surgeon: Coralie Keens, MD;  Location: [facility];  Service: General;  Laterality: N/A;  . INSERTION OF MESH N/A 04/13/2016   Procedure: INSERTION OF MESH;  Surgeon: Coralie Keens, MD;  Location: [facility];  Service: General;  Laterality: N/A;  . INSERTION OF MESH N/A 09/14/2018   Procedure: INSERTION OF MESH;  Surgeon: Coralie Keens, MD;  Location: [facility];  Service: General;  Laterality: N/A;  . LAPAROTOMY N/A 03/05/2017   Procedure: EXPLORATORY LAPAROTOMY;  Surgeon: Rolm Bookbinder, MD;  Location: [facility];  Service: General;  Laterality: N/A;  . LYSIS OF ADHESION N/A 03/05/2017   Procedure: LYSIS OF ADHESIONs;  Surgeon: Rolm Bookbinder, MD;  Location: [facility];  Service: General;  Laterality: N/A;  . PILONIDAL CYST / SINUS EXCISION    . removal of sweat gland Bilateral    arms    Family History  Problem Relation Age of Onset  . Hypertension Other   . Hyperlipidemia Other    Social History:  reports that she has never smoked. She has never used smokeless tobacco. She reports current alcohol use. She reports that she does not use drugs.  Allergies:  Allergies  Allergen Reactions  . Penicillins Anaphylaxis, Hives and Rash    PATIENT HAD A PCN REACTION WITH IMMEDIATE RASH, FACIAL/TONGUE/THROAT SWELLING, SOB, OR LIGHTHEADEDNESS WITH HYPOTENSION:   YES  Reaction causing SEVERE RASH INVOLVING MUCUS MEMBRANES or SKIN NECROSIS: #  #  #  YES  #  #  #   PATIENT HAS HAD A PCN REACTION THAT REQUIRED HOSPITALIZATION:  #  #  #  YES  #  #  #  Has patient had a PCN reaction occurring within the last 10 years: No  . Pollen Extract Hives and Itching    All-over hives    Medications Prior to Admission  Medication Sig Dispense Refill  . ARIPiprazole (ABILIFY) 5 MG tablet Take 5 mg by mouth daily.    . clindamycin (CLEOCIN)  150 MG capsule Take 2 capsules (300 mg total) by mouth 3 (three) times daily for 7 days. 42 capsule 0  . DULoxetine (CYMBALTA) 30 MG capsule Take 30 mg by mouth 2 (two) times daily.  0  . ibuprofen (ADVIL,MOTRIN) 200 MG tablet Take 400 mg by mouth every 6 (six) hours as needed (for pain).    Marland Kitchen levothyroxine (SYNTHROID, LEVOTHROID) 112 MCG tablet Take 112 mcg by mouth daily before breakfast.     . lisinopril (PRINIVIL,ZESTRIL) 5 MG tablet Take 5 mg by mouth daily.    Marland Kitchen loratadine (CLARITIN) 10 MG tablet Take 10 mg by mouth daily as needed for allergies.    . metoprolol (LOPRESSOR) 50 MG tablet Take 50 mg by mouth 2 (two) times daily.    . Rivaroxaban (XARELTO) 20 MG TABS Take 20 mg  by mouth every evening.     . traMADol (ULTRAM) 50 MG tablet Take 50 mg by mouth every 6 (six) hours as needed for pain.    Marland Kitchen zolpidem (AMBIEN) 10 MG tablet Take 10 mg by mouth at bedtime as needed for sleep.   0  . HYDROcodone-acetaminophen (NORCO/VICODIN) 5-325 MG tablet Take 1 tablet by mouth every 6 (six) hours as needed for moderate pain. (Patient not taking: Reported on 11/04/2018) 20 tablet 0  . oxyCODONE (OXY IR/ROXICODONE) 5 MG immediate release tablet Take 1 tablet (5 mg total) by mouth every 6 (six) hours as needed for moderate pain. (Patient not taking: Reported on 11/04/2018) 30 tablet 0    Results for orders placed or performed during the hospital encounter of 11/08/18 (from the past 48 hour(s))  APTT     Status: None   Collection Time: 11/08/18  6:59 PM  Result Value Ref Range   aPTT 34 24 - 36 seconds    Comment: Performed at [facility], [place] [address], [place], [place] [address]  Protime-INR     Status: None   Collection Time: 11/08/18  6:59 PM  Result Value Ref Range   Prothrombin Time 14.9 11.4 - 15.2 seconds   INR 1.2 0.8 - 1.2    Comment: (NOTE) INR goal varies based on device and disease states. Performed at [facility], [place] [address], [place], [place] [address]   Comprehensive metabolic panel     Status: Abnormal   Collection Time: 11/08/18  6:59 PM  Result Value Ref Range   Sodium 135 135 - 145 mmol/L   Potassium 3.2 (L) 3.5 - 5.1 mmol/L   Chloride 98 98 - 111 mmol/L   CO2 26 22 - 32 mmol/L   Glucose, Bld 117 (H) 70 - 99 mg/dL   BUN 5 (L) 6 - 20 mg/dL   Creatinine, Ser 0.94 0.44 - 1.00 mg/dL   Calcium 8.7 (L) 8.9 - 10.3 mg/dL   Total Protein 7.0 6.5 - 8.1 g/dL   Albumin 2.7 (L) 3.5 - 5.0 g/dL   AST 20 15 - 41 U/L   ALT 20 0 - 44 U/L   Alkaline Phosphatase 76 38 - 126 U/L   Total Bilirubin 0.6 0.3 - 1.2 mg/dL   GFR calc non Af Amer >60 >60 mL/min   GFR calc Af Amer >60 >60 mL/min   Anion gap 11 5 - 15    Comment: Performed at [facility], [place] [address] [place], [place] [address]  CBC WITH DIFFERENTIAL     Status: Abnormal   Collection Time: 11/08/18  6:59 PM  Result  Value Ref Range   WBC 15.1 (H) 4.0 - 10.5 K/uL   RBC 3.22 (L) 3.87 - 5.11 MIL/uL   Hemoglobin 10.1 (L) 12.0 - 15.0 g/dL   HCT 31.3 (L) 36.0 - 46.0 %   MCV 97.2 80.0 - 100.0 fL   MCH 31.4 26.0 - 34.0 pg   MCHC 32.3 30.0 - 36.0 g/dL   RDW 13.2 11.5 - 15.5 %   Platelets 349 150 - 400 K/uL   nRBC 0.0 0.0 - 0.2 %   Neutrophils Relative % 81 %   Neutro Abs 12.3 (H) 1.7 - 7.7 K/uL   Lymphocytes Relative 10 %   Lymphs Abs 1.4 0.7 - 4.0 K/uL   Monocytes Relative 6 %   Monocytes Absolute 1.0 0.1 - 1.0 K/uL   Eosinophils Relative 2 %   Eosinophils Absolute 0.3 0.0 - 0.5 K/uL   Basophils Relative 0 %   Basophils Absolute 0.0 0.0 - 0.1 K/uL   Immature Granulocytes 1 %   Abs Immature Granulocytes 0.11 (H) 0.00 - 0.07 K/uL    Comment: Performed at [facility], [address] [place], [place] [address]   No results found.  Review of Systems  All other systems reviewed and are negative.   Blood pressure 109/73, pulse 88, temperature 99.3 F (37.4 C), temperature source Oral, resp. rate 19, height 5\' 1"  (1.549 m), weight 109.3 kg, SpO2 96 %. Physical Exam  Constitutional: She is oriented to person, place, and time. She appears well-developed and well-nourished.  Morbidly obese  HENT:  Head: Normocephalic and atraumatic.  Right Ear: External ear normal.  Left Ear: External ear normal.  Mouth/Throat: No oropharyngeal exudate.  Eyes: Pupils are equal, round, and reactive to light. Right eye exhibits no discharge. Left eye exhibits no discharge. No scleral icterus.  Neck: Normal range of motion. No tracheal deviation present.  Cardiovascular: Normal rate, regular rhythm and normal heart sounds.  Respiratory: Effort normal and breath sounds normal.  GI: Soft. There is abdominal tenderness.  Obese with well healing incision with granulation  tissue but surrounding induration and erythema  Musculoskeletal: Normal range of motion.  Neurological: She is alert and oriented to person, place, and time.  Skin: She is not diaphoretic. There is erythema.  Psychiatric: Her behavior is normal. Judgment normal.     Assessment/Plan Infected abdominal wall post op seroma  Admit for IV antibiotics Consult Interventional Radiology for drain placement Given mesh is biologic, will not have to remove in the OR.  This should improve with antibiotics and drain placement. She is in agreement with the admission  Coralie Keens, MD 11/08/2018, 10:04 PM

## 2018-11-09 ENCOUNTER — Inpatient Hospital Stay (HOSPITAL_COMMUNITY): Payer: Medicaid Other

## 2018-11-09 ENCOUNTER — Encounter (HOSPITAL_COMMUNITY): Payer: Self-pay | Admitting: Diagnostic Radiology

## 2018-11-09 MED ORDER — FENTANYL CITRATE (PF) 100 MCG/2ML IJ SOLN
INTRAMUSCULAR | Status: AC
  Filled 2018-11-09: qty 2

## 2018-11-09 MED ORDER — LIDOCAINE HCL 1 % IJ SOLN
INTRAMUSCULAR | Status: AC
  Filled 2018-11-09: qty 20

## 2018-11-09 MED ORDER — SODIUM CHLORIDE 0.9% FLUSH
5.0000 mL | Freq: Three times a day (TID) | INTRAVENOUS | Status: DC
  Administered 2018-11-09 – 2018-11-15 (×16): 5 mL

## 2018-11-09 MED ORDER — MIDAZOLAM HCL 2 MG/2ML IJ SOLN
INTRAMUSCULAR | Status: AC | PRN
  Administered 2018-11-09: 1 mg via INTRAVENOUS

## 2018-11-09 MED ORDER — SODIUM CHLORIDE 0.9 % IV SOLN
INTRAVENOUS | Status: AC | PRN
  Administered 2018-11-09: 10 mL/h via INTRAVENOUS

## 2018-11-09 MED ORDER — VANCOMYCIN HCL 10 G IV SOLR
1500.0000 mg | INTRAVENOUS | Status: DC
  Administered 2018-11-10 – 2018-11-12 (×3): 1500 mg via INTRAVENOUS
  Filled 2018-11-09 (×4): qty 1500

## 2018-11-09 MED ORDER — IOPAMIDOL (ISOVUE-300) INJECTION 61%
INTRAVENOUS | Status: AC
  Filled 2018-11-09: qty 50

## 2018-11-09 MED ORDER — MIDAZOLAM HCL 2 MG/2ML IJ SOLN
INTRAMUSCULAR | Status: AC
  Filled 2018-11-09: qty 2

## 2018-11-09 MED ORDER — FENTANYL CITRATE (PF) 100 MCG/2ML IJ SOLN
INTRAMUSCULAR | Status: AC | PRN
  Administered 2018-11-09: 50 ug via INTRAVENOUS

## 2018-11-09 MED ORDER — VANCOMYCIN HCL 10 G IV SOLR
2000.0000 mg | Freq: Once | INTRAVENOUS | Status: AC
  Administered 2018-11-09: 2000 mg via INTRAVENOUS
  Filled 2018-11-09: qty 2000

## 2018-11-09 NOTE — Progress Notes (Signed)
   Subjective/Chief Complaint: Feels about the same today   Objective: Vital signs in last 24 hours: Temp:  [98.6 F (37 C)-99.3 F (37.4 C)] 98.6 F (37 C) (03/04 0512) Pulse Rate:  [78-96] 78 (03/04 0512) Resp:  [18-20] 18 (03/04 0512) BP: (97-109)/(62-77) 97/67 (03/04 0512) SpO2:  [94 %-99 %] 94 % (03/04 0512) Weight:  [109.3 kg] 109.3 kg (03/03 2108) Last BM Date: 11/08/18  Intake/Output from previous day: 03/03 0701 - 03/04 0700 In: 360 [P.O.:360] Out: -  Intake/Output this shift: Total I/O In: 120 [P.O.:120] Out: -   Exam: Looks more comfortable Abdomen soft, morbidly obese, mildly tender Cellulitis and induration the same  Lab Results:  Recent Labs    11/08/18 1859  WBC 15.1*  HGB 10.1*  HCT 31.3*  PLT 349   BMET Recent Labs    11/08/18 1859  NA 135  K 3.2*  CL 98  CO2 26  GLUCOSE 117*  BUN 5*  CREATININE 0.94  CALCIUM 8.7*   PT/INR Recent Labs    11/08/18 1859  LABPROT 14.9  INR 1.2   ABG No results for input(s): PHART, HCO3 in the last 72 hours.  Invalid input(s): PCO2, PO2  Studies/Results: No results found.  Anti-infectives: Anti-infectives (From admission, onward)   None      Assessment/Plan: Infected abdominal wall seroma s/p incisional hernia repair with biologic mesh  IR consult today to consider drain placement in the seroma.  PT/INR is normal  Continue IV antibiotics Resume diet after procedure Will not have to remove the mesh as it is biologic  LOS: 1 day    Coralie Keens 11/09/2018

## 2018-11-09 NOTE — Procedures (Signed)
Interventional Radiology Procedure:   Indications: Post operative seroma with infection  Procedure: US guided drainage of abdominal wall seroma  Findings: 350 ml of cloudy yellow fluid removed.   12 Fr drain in place  Complications: None     EBL: None  Plan: Send fluid for culture.     Lydell Moga R. Anselm Pancoast, MD  Pager: [phone]

## 2018-11-09 NOTE — Consult Note (Signed)
[facility] Nurse wound consult note Reason for Consult:Midline surgical wound, healing. A cell biologic mesh in place.  Abdominal wall cellulitis.  Pain in RUQ today, she reports.  Wound type:surgical Pressure Injury POA: NA Measurement: 12 cm x 2 cm x 0.2 cm  Wound XKG:YJEH and moist granulating Drainage (amount, consistency, odor) minimal serosanguinous Periwound:intact.  Round, firm pendulous abdomen Dressing procedure/placement/frequency: Cleanse abdominal wound with NS and pat gently dry.  Apply NS moist 4x4 and cover with dry dressing daily.  Will not follow at this time.  Please re-consult if needed.  Domenic Moras MSN, RN, FNP-BC CWON Wound, Ostomy, Continence Nurse Pager [phone]

## 2018-11-09 NOTE — Progress Notes (Signed)
Pharmacy Antibiotic Note  [name] is a 38 y.o. female admitted on 11/08/2018 with abdominal wall cellulitis.  Pharmacy has been consulted for vancomycin dosing. Patient has a h/o incisional hernia repair with biologic mesh. WBC elevated at 15.1. SCr 0.94.   Plan: -Vancomycin 2 gm IV once, then start Vancomycin 1500 mg IV Q 24 hrs. Goal AUC 400-550. Expected AUC: 493 SCr used: 0.94   Height: 5\' 1"  (154.9 cm) Weight: 241 lb (109.3 kg) IBW/kg (Calculated) : 47.8  Temp (24hrs), Avg:98.9 F (37.2 C), Min:98.6 F (37 C), Max:99.3 F (37.4 C)  Recent Labs  Lab 11/03/18 1542 11/08/18 1859  WBC 9.6 15.1*  CREATININE 1.03* 0.94    Estimated Creatinine Clearance: 93.7 mL/min (by C-G formula based on SCr of 0.94 mg/dL).    Allergies  Allergen Reactions  . Penicillins Anaphylaxis, Hives and Rash    PATIENT HAD A PCN REACTION WITH IMMEDIATE RASH, FACIAL/TONGUE/THROAT SWELLING, SOB, OR LIGHTHEADEDNESS WITH HYPOTENSION:   YES  Reaction causing SEVERE RASH INVOLVING MUCUS MEMBRANES or SKIN NECROSIS: #  #  #  YES  #  #  #   PATIENT HAS HAD A PCN REACTION THAT REQUIRED HOSPITALIZATION:  #  #  #  YES  #  #  #  Has patient had a PCN reaction occurring within the last 10 years: No  . Pollen Extract Hives and Itching    All-over hives      Thank you for allowing pharmacy to be a part of this patient's care.  Albertina Parr, PharmD., BCPS Clinical Pharmacist Clinical phone for 11/09/18 until 3:30pm: [phone] If after 3:30pm, please refer to [facility] for unit-specific pharmacist

## 2018-11-09 NOTE — Consult Note (Signed)
Chief Complaint: Patient was seen in consultation today for abdominal wall seroma aspiration and drainage.  Referring Physician(s): Dr. Coralie Keens  Supervising Physician: Markus Daft  Patient Status: [facility] - In-pt  History of Present Illness: [name] is a 38 y.o. female with a past medical history significant for depression, hyperthyroidism, history of DVT/PE and incisional hernia repair with biologic mesh placement 09/14/18 by Dr. [name] who presented to surgery clinic with complaints of abdominal pain and fever, her abdominal drain had also fallen out. She was admitted to [facility] by general surgery for IV antibiotics and replacement of abdominal drain in IR.  Patient reports she has had several abdominal surgeries due to a hernia and that after her most recent surgery the incision "just busted open" and started to leak. She reports nausea without vomiting and back pain which is chronic. She is aware of need for replacement of drain and is agreeable to proceed.   Past Medical History:  Diagnosis Date  . Complication of anesthesia    "difficulty waking up and will fight when woken up"  . Depression   . Gallstones   . Hernia, ventral   . History of blood clots   . History of pulmonary embolus (PE)   . Hypertension   . Hyperthyroidism    01/15/17- in the past  . Shortness of breath dyspnea     Past Surgical History:  Procedure Laterality Date  . BREAST SURGERY     cyst removed from right  . CESAREAN SECTION    . CHOLECYSTECTOMY  01/18/2017  . CHOLECYSTECTOMY N/A 01/18/2017   Procedure: LAPAROSCOPIC CHOLECYSTECTOMY;  Surgeon: Coralie Keens, MD;  Location: [place];  Service: General;  Laterality: N/A;  . HERNIA REPAIR    . INCISIONAL HERNIA REPAIR  04/13/2016  . INCISIONAL HERNIA REPAIR N/A 04/13/2016   Procedure: INCISIONAL HERNIA REPAIR;  Surgeon: Coralie Keens, MD;  Location: [place];  Service: General;  Laterality: N/A;  . [place]  09/14/2018  .  INCISIONAL HERNIA REPAIR N/A 09/14/2018   Procedure: INCISIONAL HERNIA REPAIR WITH MESH, POTENTIAL A-CELL XENOGRAFT MESH ERAS PATHWAY;  Surgeon: Coralie Keens, MD;  Location: [place];  Service: General;  Laterality: N/A;  . INSERTION OF MESH N/A 04/13/2016   Procedure: INSERTION OF MESH;  Surgeon: Coralie Keens, MD;  Location: [place];  Service: General;  Laterality: N/A;  . INSERTION OF MESH N/A 09/14/2018   Procedure: INSERTION OF MESH;  Surgeon: Coralie Keens, MD;  Location: [place];  Service: General;  Laterality: N/A;  . LAPAROTOMY N/A 03/05/2017   Procedure: EXPLORATORY LAPAROTOMY;  Surgeon: Rolm Bookbinder, MD;  Location: [place];  Service: General;  Laterality: N/A;  . LYSIS OF ADHESION N/A 03/05/2017   Procedure: LYSIS OF ADHESIONs;  Surgeon: Rolm Bookbinder, MD;  Location: [place];  Service: General;  Laterality: N/A;  . PILONIDAL CYST / SINUS EXCISION    . removal of sweat gland Bilateral    arms    Allergies: Penicillins and Pollen extract  Medications: Prior to Admission medications   Medication Sig Start Date End Date Taking? Authorizing Provider  ARIPiprazole (ABILIFY) 5 MG tablet Take 5 mg by mouth daily.   Yes [provider]  clindamycin (CLEOCIN) 150 MG capsule Take 2 capsules (300 mg total) by mouth 3 (three) times daily for 7 days. 11/04/18 11/11/18 Yes Cardama, Grayce Sessions, MD  DULoxetine (CYMBALTA) 30 MG capsule Take 30 mg by mouth 2 (two) times daily. 06/28/18  Yes [provider]  ibuprofen (ADVIL,MOTRIN) 200 MG tablet Take 400 mg by mouth every 6 (six) hours as needed (for pain).   Yes [provider]  levothyroxine (SYNTHROID, LEVOTHROID) 112 MCG tablet Take 112 mcg by mouth daily before breakfast.  07/15/17  Yes [provider]  lisinopril (PRINIVIL,ZESTRIL) 5 MG tablet Take 5 mg by mouth daily. 11/11/15  Yes [provider]  loratadine (CLARITIN) 10 MG tablet Take 10 mg by mouth daily as needed for allergies.   Yes  [provider]  metoprolol (LOPRESSOR) 50 MG tablet Take 50 mg by mouth 2 (two) times daily. 09/12/15  Yes [provider]  Rivaroxaban (XARELTO) 20 MG TABS Take 20 mg by mouth every evening.    Yes [provider]  traMADol (ULTRAM) 50 MG tablet Take 50 mg by mouth every 6 (six) hours as needed for pain. 10/21/18  Yes [provider]  zolpidem (AMBIEN) 10 MG tablet Take 10 mg by mouth at bedtime as needed for sleep.  06/28/18  Yes [provider]  HYDROcodone-acetaminophen (NORCO/VICODIN) 5-325 MG tablet Take 1 tablet by mouth every 6 (six) hours as needed for moderate pain. Patient not taking: Reported on 11/04/2018 09/25/18   Johnathan Hausen, MD  oxyCODONE (OXY IR/ROXICODONE) 5 MG immediate release tablet Take 1 tablet (5 mg total) by mouth every 6 (six) hours as needed for moderate pain. Patient not taking: Reported on 11/04/2018 09/16/18   Coralie Keens, MD     Family History  Problem Relation Age of Onset  . Hypertension Other   . Hyperlipidemia Other     Social History   Socioeconomic History  . Marital status: Married    Spouse name: Not on file  . Number of children: Not on file  . Years of education: Not on file  . Highest education level: Not on file  Occupational History  . Not on file  Social Needs  . Financial resource strain: Not on file  . Food insecurity:    Worry: Not on file    Inability: Not on file  . Transportation needs:    Medical: Not on file    Non-medical: Not on file  Tobacco Use  . Smoking status: Never Smoker  . Smokeless tobacco: Never Used  Substance and Sexual Activity  . Alcohol use: Yes    Comment: occasionally  . Drug use: No  . Sexual activity: Not on file  Lifestyle  . Physical activity:    Days per week: Not on file    Minutes per session: Not on file  . Stress: Not on file  Relationships  . Social connections:    Talks on phone: Not on file    Gets together: Not on file    Attends  religious service: Not on file    Active member of club or organization: Not on file    Attends meetings of clubs or organizations: Not on file    Relationship status: Not on file  Other Topics Concern  . Not on file  Social History Narrative  . Not on file     Review of Systems: A 12 point ROS discussed and pertinent positives are indicated in the HPI above.  All other systems are negative.  Review of Systems  Constitutional: Positive for appetite change, chills, fatigue and fever.  Respiratory: Negative for cough and shortness of breath.   Cardiovascular: Negative for chest pain.  Gastrointestinal: Positive for abdominal pain. Negative for blood in stool, constipation, diarrhea, nausea and vomiting.  Musculoskeletal:  Positive for back pain.  Skin: Positive for wound (abdominal from previous surgery).  Neurological: Negative for dizziness, syncope and headaches.  Psychiatric/Behavioral: Negative for confusion.    Vital Signs: BP 97/67 (BP Location: Right Arm)   Pulse 78   Temp 98.6 F (37 C) (Oral)   Resp 18   Ht 5\' 1"  (1.549 m)   Wt 241 lb (109.3 kg)   SpO2 94%   BMI 45.54 kg/m   Physical Exam Vitals signs and nursing note reviewed.  Constitutional:      General: She is not in acute distress.    Appearance: She is obese.  HENT:     Head: Normocephalic.  Cardiovascular:     Rate and Rhythm: Normal rate and regular rhythm.  Pulmonary:     Effort: Pulmonary effort is normal.     Breath sounds: Normal breath sounds.  Abdominal:     Palpations: Abdomen is soft.     Tenderness: There is abdominal tenderness (Right side).     Comments: (+) abdominal wound - erythematous, very tender to palpation.  Skin:    General: Skin is warm and dry.  Neurological:     Mental Status: She is alert and oriented to person, place, and time.  Psychiatric:        Mood and Affect: Mood normal.        Behavior: Behavior normal.        Thought Content: Thought content normal.         Judgment: Judgment normal.      MD Evaluation Airway: WNL Heart: WNL Abdomen: WNL Chest/ Lungs: WNL ASA  Classification: 2 Mallampati/Airway Score: One   Imaging: Ct Abdomen Pelvis W Contrast  Result Date: 11/04/2018 CLINICAL DATA:  Persistent abdominal pain and fluid retention after hernia repair. EXAM: CT ABDOMEN AND PELVIS WITH CONTRAST TECHNIQUE: Multidetector CT imaging of the abdomen and pelvis was performed using the standard protocol following bolus administration of intravenous contrast. CONTRAST:  115mL OMNIPAQUE IOHEXOL 300 MG/ML  SOLN COMPARISON:  07/18/2018 FINDINGS: Lower chest: Lung bases are clear. Hepatobiliary: No focal liver abnormality is seen. Status post cholecystectomy. No biliary dilatation. Pancreas: Unremarkable. No pancreatic ductal dilatation or surrounding inflammatory changes. Spleen: Normal in size without focal abnormality. Adrenals/Urinary Tract: No adrenal gland nodules. Ectopic location of the right kidney. Nephrograms are symmetrical. No hydronephrosis or hydroureter. Bladder is decompressed. Stomach/Bowel: Stomach, small bowel, and colon are mostly decompressed. Scattered stool in the colon. No wall thickening or inflammatory changes. Appendix is normal. Vascular/Lymphatic: No significant vascular findings are present. No enlarged abdominal or pelvic lymph nodes. Reproductive: Uterus and bilateral adnexa are unremarkable. Other: No free air or free fluid in the abdomen. Interval postoperative changes with repair of a previously demonstrated ventral abdominal wall hernia. Postoperative scarring in the anterior abdominal wall. In the anterior abdominal wall, there is a circumscribed thick-walled fluid collection measuring 6.3 x 8.9 cm in diameter. This likely represents a postoperative fluid collection. Wall thickening suggests possibility of infection. No gas is demonstrated. There is infiltration in the subcutaneous fat around this lesion with associated skin  thickening, likely cellulitis. No free air or free fluid in the abdomen. No residual or recurrent hernia is suggested. Musculoskeletal: No acute or significant osseous findings. IMPRESSION: Postoperative changes with repair of ventral abdominal wall hernia. Nearly 9 cm diameter thick-walled fluid collection in the anterior abdominal wall likely representing a postoperative fluid collection. Wall thickening suggests infection. Probable surrounding soft tissue cellulitis. Electronically Signed   By: Gwyndolyn Saxon  Gerilyn Nestle M.D.   On: 11/04/2018 00:41    Labs:  CBC: Recent Labs    09/24/18 0318 09/25/18 0254 11/03/18 1542 11/08/18 1859  WBC 6.4 6.8 9.6 15.1*  HGB 11.9* 11.3* 12.6 10.1*  HCT 35.4* 35.1* 38.8 31.3*  PLT 357 355 393 349    COAGS: Recent Labs    09/14/18 1056 11/08/18 1859  INR 0.95 1.2  APTT  --  34    BMP: Recent Labs    09/15/18 0341 09/18/18 0849 11/03/18 1542 11/08/18 1859  NA 136 136 138 135  K 4.5 4.0 3.7 3.2*  CL 103 101 105 98  CO2 25 26 21* 26  GLUCOSE 118* 90 98 117*  BUN 7 7 6  5*  CALCIUM 8.2* 8.9 8.9 8.7*  CREATININE 0.88 0.93 1.03* 0.94  GFRNONAA >60 >60 >60 >60  GFRAA >60 >60 >60 >60    LIVER FUNCTION TESTS: Recent Labs    07/05/18 1210 07/18/18 1614 11/03/18 1542 11/08/18 1859  BILITOT 0.7 0.6 0.5 0.6  AST 32 23 27 20   ALT 12 12 16 20   ALKPHOS 56 56 69 76  PROT 6.6 6.3* 7.9 7.0  ALBUMIN 3.4* 3.4* 3.8 2.7*    TUMOR MARKERS: No results for input(s): AFPTM, CEA, CA199, CHROMGRNA in the last 8760 hours.  Assessment and Plan:  38 y/o F s/p incisional hernia repair with biologic mesh 09/14/18 by Dr. [name] admitted by surgery to IV antibiotics due to abdominal wall cellulitis. She previously had an abdominal wall drain placed in IR in 2017 for the same issue and had recently had this drain replaced by surgery, unfortunately this most recent drain has fallen out and request has been made to IR for image guided replacement of this drain  with moderate sedation. Patient reviewed by Dr. Anselm Pancoast who is agreeable to drain placement today.   Patient has been NPO since midnight, she denies taking blood thinning medications at home - per chart was previously on Xarelto for DVT/PE in January of this year, not currently receiving Xarelto while admitted per [facility]. Afebrile - not currently on IV antibiotics per MAR, WBC 15.1, hgb 10.1, plt 349, INR 1.2.  Risks and benefits discussed with the patient including bleeding, infection, damage to adjacent structures, bowel perforation/fistula connection, and sepsis.  All of the patient's questions were answered, patient is agreeable to proceed.  Consent signed and in chart.  Thank you for this interesting consult.  I greatly enjoyed meeting [name] and look forward to participating in their care.  A copy of this report was sent to the requesting provider on this date.  Electronically Signed: Joaquim Nam, PA-C 11/09/2018, 9:01 AM   I spent a total of 40 Minutes  in face to face in clinical consultation, greater than 50% of which was counseling/coordinating care for abdominal wall seroma aspiration/drain placement.

## 2018-11-10 LAB — BASIC METABOLIC PANEL
Anion gap: 8 (ref 5–15)
BUN: 6 mg/dL (ref 6–20)
CO2: 25 mmol/L (ref 22–32)
Calcium: 8 mg/dL — ABNORMAL LOW (ref 8.9–10.3)
Chloride: 104 mmol/L (ref 98–111)
Creatinine, Ser: 1.06 mg/dL — ABNORMAL HIGH (ref 0.44–1.00)
GFR calc Af Amer: >60 mL/min (ref >60)
GFR calc non Af Amer: >60 mL/min (ref >60)
Glucose, Bld: 109 mg/dL — ABNORMAL HIGH (ref 70–99)
POTASSIUM: 3.9 mmol/L (ref 3.5–5.1)
SODIUM: 137 mmol/L (ref 135–145)

## 2018-11-10 LAB — CBC
HCT: 29 % — ABNORMAL LOW (ref 36.0–46.0)
Hemoglobin: 9.2 g/dL — ABNORMAL LOW (ref 12.0–15.0)
MCH: 31.7 pg (ref 26.0–34.0)
MCHC: 31.7 g/dL (ref 30.0–36.0)
MCV: 100 fL (ref 80.0–100.0)
Platelets: 322 10*3/uL (ref 150–400)
RBC: 2.9 MIL/uL — ABNORMAL LOW (ref 3.87–5.11)
RDW: 13.2 % (ref 11.5–15.5)
WBC: 10.4 10*3/uL (ref 4.0–10.5)
nRBC: 0.2 % (ref 0.0–0.2)

## 2018-11-10 MED ORDER — HYDROMORPHONE HCL 1 MG/ML IJ SOLN
1.0000 mg | INTRAMUSCULAR | Status: DC | PRN
  Administered 2018-11-10 – 2018-11-15 (×40): 1 mg via INTRAVENOUS
  Filled 2018-11-10 (×41): qty 1

## 2018-11-10 NOTE — Progress Notes (Signed)
Referring Physician(s): Dr [name]  Supervising Physician: [name]  Patient Status:  [name] - In-pt  Chief Complaint:  abd seroma drain placed in IR 3/4  Subjective:  Up in chair No complaints Feels less pain OP great; dark yellow fluid   Allergies: Penicillins and Pollen extract  Medications: Prior to Admission medications   Medication Sig Start Date End Date Taking? Authorizing Provider  ARIPiprazole (ABILIFY) 5 MG tablet Take 5 mg by mouth daily.   Yes [provider]  clindamycin (CLEOCIN) 150 MG capsule Take 2 capsules (300 mg total) by mouth 3 (three) times daily for 7 days. 11/04/18 11/11/18 Yes Cardama, Grayce Sessions, MD  DULoxetine (CYMBALTA) 30 MG capsule Take 30 mg by mouth 2 (two) times daily. 06/28/18  Yes [provider]  ibuprofen (ADVIL,MOTRIN) 200 MG tablet Take 400 mg by mouth every 6 (six) hours as needed (for pain).   Yes [provider]  levothyroxine (SYNTHROID, LEVOTHROID) 112 MCG tablet Take 112 mcg by mouth daily before breakfast.  07/15/17  Yes [provider]  lisinopril (PRINIVIL,ZESTRIL) 5 MG tablet Take 5 mg by mouth daily. 11/11/15  Yes [provider]  loratadine (CLARITIN) 10 MG tablet Take 10 mg by mouth daily as needed for allergies.   Yes [provider]  metoprolol (LOPRESSOR) 50 MG tablet Take 50 mg by mouth 2 (two) times daily. 09/12/15  Yes [provider]  Rivaroxaban (XARELTO) 20 MG TABS Take 20 mg by mouth every evening.    Yes [provider]  traMADol (ULTRAM) 50 MG tablet Take 50 mg by mouth every 6 (six) hours as needed for pain. 10/21/18  Yes [provider]  zolpidem (AMBIEN) 10 MG tablet Take 10 mg by mouth at bedtime as needed for sleep.  06/28/18  Yes [provider]  HYDROcodone-acetaminophen (NORCO/VICODIN) 5-325 MG tablet Take 1 tablet by mouth every 6 (six) hours as needed for moderate pain. Patient not taking: Reported on 11/04/2018  09/25/18   Lauren Hausen, MD  oxyCODONE (OXY IR/ROXICODONE) 5 MG immediate release tablet Take 1 tablet (5 mg total) by mouth every 6 (six) hours as needed for moderate pain. Patient not taking: Reported on 11/04/2018 09/16/18   Lauren Keens, MD     Vital Signs: BP 107/75 (BP Location: Right Arm)   Pulse 77   Temp 98.6 F (37 C) (Oral)   Resp 18   Ht 5\' 1"  (1.549 m)   Wt 241 lb (109.3 kg)   SpO2 100%   BMI 45.54 kg/m   Physical Exam Vitals signs reviewed.  Skin:    General: Skin is warm and dry.     Comments: Site is clean and dry NT no bleeding OP dark yellow: 220 cc today Cx pending  Neurological:     Mental Status: She is alert.     Imaging: Ir US Guide Bx Asp/drain  Result Date: 11/09/2018 INDICATION: 38 year old with cellulitis and suspect an infected abdominal wall seroma. Plan for image guided drainage. EXAM: ULTRASOUND-GUIDED DRAIN PLACEMENT IN ABDOMINAL WALL FLUID COLLECTION MEDICATIONS: The patient is currently admitted to the hospital and receiving intravenous antibiotics. The antibiotics were administered within an appropriate time frame prior to the initiation of the procedure. ANESTHESIA/SEDATION: Fentanyl 50 mcg IV; Versed 1.0 mg IV Moderate Sedation Time:  15 minutes The patient was continuously monitored during the procedure by the interventional radiology nurse under my direct supervision. COMPLICATIONS: None immediate. PROCEDURE: Informed written consent was obtained from the patient after a  thorough discussion of the procedural risks, benefits and alternatives. All questions were addressed. Maximal Sterile Barrier Technique was utilized including caps, mask, sterile gowns, sterile gloves, sterile drape, hand hygiene and skin antiseptic. A timeout was performed prior to the initiation of the procedure. Large abdominal wall fluid collection was identified with ultrasound. The left side of the abdomen was prepped and draped in sterile fashion. Skin was  anesthetized with 1% lidocaine. 18 gauge trocar needle was directed in the fluid collection with ultrasound guidance. Stiff Amplatz wire was advanced into the collection and the tract was dilated to accommodate a 12 [place] drain. 350 mL of cloudy yellow fluid was removed. A sample was sent for culture. Catheter was sutured to the skin and attached to a suction bulb. FINDINGS: Large midline abdominal wall fluid collection. Echogenic debris in the dependent aspect of the collection. 350 mL of cloudy yellow fluid was removed. The collection was decompressed at the end of the procedure. IMPRESSION: Successful placement of a drain in the abdominal wall fluid collection with ultrasound guidance. Fluid was sent for culture. Electronically Signed   By: [name] M.D.   On: 11/09/2018 15:32    Labs:  CBC: Recent Labs    09/25/18 0254 11/03/18 1542 11/08/18 1859 11/10/18 0154  WBC 6.8 9.6 15.1* 10.4  HGB 11.3* 12.6 10.1* 9.2*  HCT 35.1* 38.8 31.3* 29.0*  PLT 355 393 349 322    COAGS: Recent Labs    09/14/18 1056 11/08/18 1859  INR 0.95 1.2  APTT  --  34    BMP: Recent Labs    09/18/18 0849 11/03/18 1542 11/08/18 1859 11/10/18 0154  NA 136 138 135 137  K 4.0 3.7 3.2* 3.9  CL 101 105 98 104  CO2 26 21* 26 25  GLUCOSE 90 98 117* 109*  BUN 7 6 5* 6  CALCIUM 8.9 8.9 8.7* 8.0*  CREATININE 0.93 1.03* 0.94 1.06*  GFRNONAA >60 >60 >60 >60  GFRAA >60 >60 >60 >60    LIVER FUNCTION TESTS: Recent Labs    07/05/18 1210 07/18/18 1614 11/03/18 1542 11/08/18 1859  BILITOT 0.7 0.6 0.5 0.6  AST 32 23 27 20   ALT 12 12 16 20   ALKPHOS 56 56 69 76  PROT 6.6 6.3* 7.9 7.0  ALBUMIN 3.4* 3.4* 3.8 2.7*    Assessment and Plan:  abdominal seroma drain intact Will follow Plans per [facility]  Electronically Signed: Lavonia Drafts, PA-C 11/10/2018, 11:35 AM   I spent a total of 15 Minutes at the the patient's bedside AND on the patient's hospital floor or unit, greater than 50% of which was  counseling/coordinating care for abd seroma drain

## 2018-11-10 NOTE — Progress Notes (Signed)
Subjective/Chief Complaint: She reports feeling better, having less pain after drain placement   Objective: Vital signs in last 24 hours: Temp:  [98.6 F (37 C)-98.8 F (37.1 C)] 98.6 F (37 C) (03/05 0523) Pulse Rate:  [75-85] 77 (03/05 0523) Resp:  [17-21] 18 (03/05 0523) BP: (102-113)/(64-75) 107/75 (03/05 0523) SpO2:  [97 %-100 %] 100 % (03/05 0523) Last BM Date: 11/08/18  Intake/Output from previous day: 03/04 0701 - 03/05 0700 In: 3456.2 [P.O.:600; I.V.:2382.9; IV Piggyback:473.4] Out: 220 [Drains:220] Intake/Output this shift: No intake/output data recorded.  Exam: Abdominal wall still tender with some edema but much less erythema. Drain serous without purulence  Lab Results:  Recent Labs    11/08/18 1859 11/10/18 0154  WBC 15.1* 10.4  HGB 10.1* 9.2*  HCT 31.3* 29.0*  PLT 349 322   BMET Recent Labs    11/08/18 1859 11/10/18 0154  NA 135 137  K 3.2* 3.9  CL 98 104  CO2 26 25  GLUCOSE 117* 109*  BUN 5* 6  CREATININE 0.94 1.06*  CALCIUM 8.7* 8.0*   PT/INR Recent Labs    11/08/18 1859  LABPROT 14.9  INR 1.2   ABG No results for input(s): PHART, HCO3 in the last 72 hours.  Invalid input(s): PCO2, PO2  Studies/Results: Ir US Guide Bx Asp/drain  Result Date: 11/09/2018 INDICATION: 38 year old with cellulitis and suspect an infected abdominal wall seroma. Plan for image guided drainage. EXAM: ULTRASOUND-GUIDED DRAIN PLACEMENT IN ABDOMINAL WALL FLUID COLLECTION MEDICATIONS: The patient is currently admitted to the hospital and receiving intravenous antibiotics. The antibiotics were administered within an appropriate time frame prior to the initiation of the procedure. ANESTHESIA/SEDATION: Fentanyl 50 mcg IV; Versed 1.0 mg IV Moderate Sedation Time:  15 minutes The patient was continuously monitored during the procedure by the interventional radiology nurse under my direct supervision. COMPLICATIONS: None immediate. PROCEDURE: Informed written  consent was obtained from the patient after a thorough discussion of the procedural risks, benefits and alternatives. All questions were addressed. Maximal Sterile Barrier Technique was utilized including caps, mask, sterile gowns, sterile gloves, sterile drape, hand hygiene and skin antiseptic. A timeout was performed prior to the initiation of the procedure. Large abdominal wall fluid collection was identified with ultrasound. The left side of the abdomen was prepped and draped in sterile fashion. Skin was anesthetized with 1% lidocaine. 18 gauge trocar needle was directed in the fluid collection with ultrasound guidance. Stiff Amplatz wire was advanced into the collection and the tract was dilated to accommodate a 12 [place] drain. 350 mL of cloudy yellow fluid was removed. A sample was sent for culture. Catheter was sutured to the skin and attached to a suction bulb. FINDINGS: Large midline abdominal wall fluid collection. Echogenic debris in the dependent aspect of the collection. 350 mL of cloudy yellow fluid was removed. The collection was decompressed at the end of the procedure. IMPRESSION: Successful placement of a drain in the abdominal wall fluid collection with ultrasound guidance. Fluid was sent for culture. Electronically Signed   By: Markus Daft M.D.   On: 11/09/2018 15:32    Anti-infectives: Anti-infectives (From admission, onward)   Start     Dose/Rate Route Frequency Ordered Stop   11/10/18 1300  vancomycin (VANCOCIN) 1,500 mg in sodium chloride 0.9 % 500 mL IVPB     1,500 mg 250 mL/hr over 120 Minutes Intravenous Every 24 hours 11/09/18 1218     11/09/18 1230  vancomycin (VANCOCIN) 2,000 mg in sodium chloride 0.9 % 500  mL IVPB     2,000 mg 250 mL/hr over 120 Minutes Intravenous  Once 11/09/18 1209 11/09/18 1501      Assessment/Plan: Abdominal wall cellulitis with possible infected seroma  WBC now normal Continue pain control, IV antibiotics Decrease IVF  LOS: 2 days     Coralie Keens 11/10/2018

## 2018-11-11 MED ORDER — POLYETHYLENE GLYCOL 3350 17 G PO PACK
17.0000 g | PACK | Freq: Every day | ORAL | Status: DC
  Administered 2018-11-11 – 2018-11-15 (×5): 17 g via ORAL
  Filled 2018-11-11 (×5): qty 1

## 2018-11-11 MED ORDER — DULOXETINE HCL 30 MG PO CPEP
30.0000 mg | ORAL_CAPSULE | Freq: Two times a day (BID) | ORAL | Status: DC
  Administered 2018-11-11 – 2018-11-15 (×9): 30 mg via ORAL
  Filled 2018-11-11 (×9): qty 1

## 2018-11-11 MED ORDER — DIPHENHYDRAMINE HCL 25 MG PO CAPS
25.0000 mg | ORAL_CAPSULE | Freq: Four times a day (QID) | ORAL | Status: DC | PRN
  Administered 2018-11-11 – 2018-11-14 (×8): 25 mg via ORAL
  Filled 2018-11-11 (×8): qty 1

## 2018-11-11 MED ORDER — LISINOPRIL 5 MG PO TABS
5.0000 mg | ORAL_TABLET | Freq: Every day | ORAL | Status: DC
  Administered 2018-11-11 – 2018-11-15 (×5): 5 mg via ORAL
  Filled 2018-11-11 (×5): qty 1

## 2018-11-11 MED ORDER — PROMETHAZINE HCL 25 MG/ML IJ SOLN
12.5000 mg | Freq: Four times a day (QID) | INTRAMUSCULAR | Status: DC | PRN
  Administered 2018-11-11 – 2018-11-13 (×8): 12.5 mg via INTRAVENOUS
  Administered 2018-11-14: 12 mg via INTRAVENOUS
  Administered 2018-11-14 – 2018-11-15 (×4): 12.5 mg via INTRAVENOUS
  Filled 2018-11-11 (×14): qty 1

## 2018-11-11 MED ORDER — RIVAROXABAN 20 MG PO TABS
20.0000 mg | ORAL_TABLET | Freq: Every day | ORAL | Status: DC
  Administered 2018-11-11 – 2018-11-14 (×4): 20 mg via ORAL
  Filled 2018-11-11 (×4): qty 1

## 2018-11-11 MED ORDER — METOPROLOL TARTRATE 50 MG PO TABS
50.0000 mg | ORAL_TABLET | Freq: Two times a day (BID) | ORAL | Status: DC
  Administered 2018-11-11 – 2018-11-15 (×8): 50 mg via ORAL
  Filled 2018-11-11 (×9): qty 1

## 2018-11-11 NOTE — Progress Notes (Signed)
Subjective/Chief Complaint: Complains of itching and some nausea this morning Pain slowly improving   Objective: Vital signs in last 24 hours: Temp:  [97.8 F (36.6 C)-98.4 F (36.9 C)] 98.4 F (36.9 C) (03/05 2141) Pulse Rate:  [62-71] 71 (03/05 2141) Resp:  [18] 18 (03/05 2141) BP: (114-149)/(85-95) 130/86 (03/05 2200) SpO2:  [98 %-100 %] 100 % (03/05 2141) Last BM Date: 11/08/18  Intake/Output from previous day: 03/05 0701 - 03/06 0700 In: 1807.6 [P.O.:910; I.V.:887.6] Out: 110 [Drains:110] Intake/Output this shift: No intake/output data recorded.  Exam: Awake and alert Abdomen morbidly obese with edema, erythema almost resolved  Lab Results:  Recent Labs    11/08/18 1859 11/10/18 0154  WBC 15.1* 10.4  HGB 10.1* 9.2*  HCT 31.3* 29.0*  PLT 349 322   BMET Recent Labs    11/08/18 1859 11/10/18 0154  NA 135 137  K 3.2* 3.9  CL 98 104  CO2 26 25  GLUCOSE 117* 109*  BUN 5* 6  CREATININE 0.94 1.06*  CALCIUM 8.7* 8.0*   PT/INR Recent Labs    11/08/18 1859  LABPROT 14.9  INR 1.2   ABG No results for input(s): PHART, HCO3 in the last 72 hours.  Invalid input(s): PCO2, PO2  Studies/Results: Ir US Guide Bx Asp/drain  Result Date: 11/09/2018 INDICATION: 38 year old with cellulitis and suspect an infected abdominal wall seroma. Plan for image guided drainage. EXAM: ULTRASOUND-GUIDED DRAIN PLACEMENT IN ABDOMINAL WALL FLUID COLLECTION MEDICATIONS: The patient is currently admitted to the hospital and receiving intravenous antibiotics. The antibiotics were administered within an appropriate time frame prior to the initiation of the procedure. ANESTHESIA/SEDATION: Fentanyl 50 mcg IV; Versed 1.0 mg IV Moderate Sedation Time:  15 minutes The patient was continuously monitored during the procedure by the interventional radiology nurse under my direct supervision. COMPLICATIONS: None immediate. PROCEDURE: Informed written consent was obtained from the patient  after a thorough discussion of the procedural risks, benefits and alternatives. All questions were addressed. Maximal Sterile Barrier Technique was utilized including caps, mask, sterile gowns, sterile gloves, sterile drape, hand hygiene and skin antiseptic. A timeout was performed prior to the initiation of the procedure. Large abdominal wall fluid collection was identified with ultrasound. The left side of the abdomen was prepped and draped in sterile fashion. Skin was anesthetized with 1% lidocaine. 18 gauge trocar needle was directed in the fluid collection with ultrasound guidance. Stiff Amplatz wire was advanced into the collection and the tract was dilated to accommodate a 12 [place] drain. 350 mL of cloudy yellow fluid was removed. A sample was sent for culture. Catheter was sutured to the skin and attached to a suction bulb. FINDINGS: Large midline abdominal wall fluid collection. Echogenic debris in the dependent aspect of the collection. 350 mL of cloudy yellow fluid was removed. The collection was decompressed at the end of the procedure. IMPRESSION: Successful placement of a drain in the abdominal wall fluid collection with ultrasound guidance. Fluid was sent for culture. Electronically Signed   By: Markus Daft M.D.   On: 11/09/2018 15:32    Anti-infectives: Anti-infectives (From admission, onward)   Start     Dose/Rate Route Frequency Ordered Stop   11/10/18 1300  vancomycin (VANCOCIN) 1,500 mg in sodium chloride 0.9 % 500 mL IVPB     1,500 mg 250 mL/hr over 120 Minutes Intravenous Every 24 hours 11/09/18 1218     11/09/18 1230  vancomycin (VANCOCIN) 2,000 mg in sodium chloride 0.9 % 500 mL IVPB  2,000 mg 250 mL/hr over 120 Minutes Intravenous  Once 11/09/18 1209 11/09/18 1501      Assessment/Plan: Infected abdominal wall seroma  Cultures show gram +cocci.  Continue IV antibiotics through the weekend Continue drain.  Will eventually go home with the drain Saline lock IV Resume  home meds including anticoag meds  LOS: 3 days    Coralie Keens 11/11/2018

## 2018-11-12 NOTE — Progress Notes (Signed)
Pt. complaining of nausea unrelieved by Zofran. Pt. reports having vomited 4 times tonight. Episodes of emesis have been unwitnessed. Paged MD and received orders for PRN phenergan. Will administer dose and continue to monitor pt.

## 2018-11-12 NOTE — Plan of Care (Signed)

## 2018-11-12 NOTE — Progress Notes (Signed)
Patient ID: [name], female   DOB: November 18, 1980, 38 y.o.   MRN: [id number] [facility] Progress Note:   * No surgery found *  Subjective: Mental status is alert. Objective: Vital signs in last 24 hours: Temp:  [97.7 F (36.5 C)-98.2 F (36.8 C)] 98.2 F (36.8 C) (03/07 0457) Pulse Rate:  [45-98] 68 (03/07 0541) Resp:  [16-18] 16 (03/07 0457) BP: (108-150)/(59-95) 126/95 (03/07 0457) SpO2:  [100 %] 100 % (03/07 0457)  Intake/Output from previous day: 03/06 0701 - 03/07 0700 In: 2445.8 [P.O.:714; I.V.:726.8; IV Piggyback:1000] Out: 50 [Drains:50] Intake/Output this shift: No intake/output data recorded.  Physical Exam: Work of breathing is normal.  Drain in place with pink opalescent fluid in bulb.  She has been up walking a lot.    Lab Results:  No results found for this or any previous visit (from the past 48 hour(s)).  Radiology/Results: No results found.  Anti-infectives: Anti-infectives (From admission, onward)   Start     Dose/Rate Route Frequency Ordered Stop   11/10/18 1300  vancomycin (VANCOCIN) 1,500 mg in sodium chloride 0.9 % 500 mL IVPB     1,500 mg 250 mL/hr over 120 Minutes Intravenous Every 24 hours 11/09/18 1218     11/09/18 1230  vancomycin (VANCOCIN) 2,000 mg in sodium chloride 0.9 % 500 mL IVPB     2,000 mg 250 mL/hr over 120 Minutes Intravenous  Once 11/09/18 1209 11/09/18 1501      Assessment/Plan: Problem List: Patient Active Problem List   Diagnosis Date Noted  . Abdominal wall seroma 11/08/2018  . Wound dehiscence 09/23/2018  . Pre-operative cardiovascular examination 08/09/2018  . Left upper quadrant pain 03/03/2017  . Generalized abdominal wall pain 03/03/2017  . Obesity, Class III, BMI 40-49.9 (morbid obesity) ([place]) 03/03/2017  . Small bowel obstruction due to adhesions ([place]) 02/24/2017  . Essential hypertension 02/24/2017  . History of pulmonary embolism 02/24/2017  . Hypothyroidism 02/24/2017  . S/P laparoscopic  cholecystectomy 01/18/2017  . Post op infection 05/03/2016  . Incisional hernia 04/13/2016    She has dilaudid ordered which helps the most and she complains of nausea with oxycodone.  Will leave exisiting orders in place since dilaudid dose seem sufficient.   * No surgery found *    LOS: 4 days   Matt B. Hassell Done, MD, [facility] [phone] beeper [phone]  11/12/2018 9:59 AM

## 2018-11-13 LAB — CREATININE, SERUM
Creatinine, Ser: 1.01 mg/dL — ABNORMAL HIGH (ref 0.44–1.00)
GFR calc Af Amer: >60 mL/min (ref >60)
GFR calc non Af Amer: >60 mL/min (ref >60)

## 2018-11-13 MED ORDER — CLINDAMYCIN HCL 300 MG PO CAPS
300.0000 mg | ORAL_CAPSULE | Freq: Four times a day (QID) | ORAL | Status: DC
  Administered 2018-11-13 – 2018-11-15 (×8): 300 mg via ORAL
  Filled 2018-11-13 (×8): qty 1

## 2018-11-13 NOTE — Progress Notes (Signed)
Patient ID: [name], female   DOB: 25-Sep-1980, 38 y.o.   MRN: [id number] [facility] Progress Note:   * No surgery found *  Subjective: Mental status is more cheerful;  Getting up and around the unit pretty well. Objective: Vital signs in last 24 hours: Temp:  [97.8 F (36.6 C)-98.5 F (36.9 C)] 98.2 F (36.8 C) (03/08 0553) Pulse Rate:  [50-60] 54 (03/08 0553) Resp:  [16-17] 16 (03/08 0553) BP: (126-152)/(86-89) 126/89 (03/08 0553) SpO2:  [99 %-100 %] 99 % (03/08 0553)  Intake/Output from previous day: 03/07 0701 - 03/08 0700 In: 235 [P.O.:230] Out: 55 [Drains:55] Intake/Output this shift: No intake/output data recorded.  Physical Exam: Work of breathing is OK.  Drain in place  Lab Results:  Results for orders placed or performed during the hospital encounter of 11/08/18 (from the past 48 hour(s))  Creatinine, serum     Status: Abnormal   Collection Time: 11/13/18  5:05 AM  Result Value Ref Range   Creatinine, Ser 1.01 (H) 0.44 - 1.00 mg/dL   GFR calc non Af Amer >60 >60 mL/min   GFR calc Af Amer >60 >60 mL/min    Comment: Performed at [facility] [address], [place], [place] [address]    Radiology/Results: No results found.  Anti-infectives: Anti-infectives (From admission, onward)   Start     Dose/Rate Route Frequency Ordered Stop   11/10/18 1300  vancomycin (VANCOCIN) 1,500 mg in sodium chloride 0.9 % 500 mL IVPB     1,500 mg 250 mL/hr over 120 Minutes Intravenous Every 24 hours 11/09/18 1218     11/09/18 1230  vancomycin (VANCOCIN) 2,000 mg in sodium chloride 0.9 % 500 mL IVPB     2,000 mg 250 mL/hr over 120 Minutes Intravenous  Once 11/09/18 1209 11/09/18 1501      Assessment/Plan: Problem List: Patient Active Problem List   Diagnosis Date Noted  . Abdominal wall seroma 11/08/2018  . Wound dehiscence 09/23/2018  . Pre-operative cardiovascular examination 08/09/2018  . Left upper quadrant pain 03/03/2017  . Generalized  abdominal wall pain 03/03/2017  . Obesity, Class III, BMI 40-49.9 (morbid obesity) ([place]) 03/03/2017  . Small bowel obstruction due to adhesions ([place]) 02/24/2017  . Essential hypertension 02/24/2017  . History of pulmonary embolism 02/24/2017  . Hypothyroidism 02/24/2017  . S/P laparoscopic cholecystectomy 01/18/2017  . Post op infection 05/03/2016  . Incisional hernia 04/13/2016    Stable today.  Outpatient management plans per Dr. [name].   * No surgery found *    LOS: 5 days   Matt B. Hassell Done, MD, [facility] [phone] beeper [phone]  11/13/2018 9:09 AM

## 2018-11-13 NOTE — Plan of Care (Signed)

## 2018-11-13 NOTE — Progress Notes (Signed)
Referring Physician(s): Dr. Coralie Keens  Supervising Physician: Daryll Brod  Patient Status:  [facility] - In-pt  Chief Complaint: Follow up abdominal wall seroma drain placed 3/4 by Dr. Anselm Pancoast  Subjective:  Patient sitting on her bed eating cereal, reports her pain is a little better today and that if her pain is well controlled tomorrow she might be able to go home. Concerned that the drain is not putting out as much and that her abdominal distention has no improved.   Allergies: Penicillins and Pollen extract  Medications: Prior to Admission medications   Medication Sig Start Date End Date Taking? Authorizing Provider  ARIPiprazole (ABILIFY) 5 MG tablet Take 5 mg by mouth daily.   Yes [provider]  DULoxetine (CYMBALTA) 30 MG capsule Take 30 mg by mouth 2 (two) times daily. 06/28/18  Yes [provider]  ibuprofen (ADVIL,MOTRIN) 200 MG tablet Take 400 mg by mouth every 6 (six) hours as needed (for pain).   Yes [provider]  levothyroxine (SYNTHROID, LEVOTHROID) 112 MCG tablet Take 112 mcg by mouth daily before breakfast.  07/15/17  Yes [provider]  lisinopril (PRINIVIL,ZESTRIL) 5 MG tablet Take 5 mg by mouth daily. 11/11/15  Yes [provider]  loratadine (CLARITIN) 10 MG tablet Take 10 mg by mouth daily as needed for allergies.   Yes [provider]  metoprolol (LOPRESSOR) 50 MG tablet Take 50 mg by mouth 2 (two) times daily. 09/12/15  Yes [provider]  Rivaroxaban (XARELTO) 20 MG TABS Take 20 mg by mouth every evening.    Yes [provider]  traMADol (ULTRAM) 50 MG tablet Take 50 mg by mouth every 6 (six) hours as needed for pain. 10/21/18  Yes [provider]  zolpidem (AMBIEN) 10 MG tablet Take 10 mg by mouth at bedtime as needed for sleep.  06/28/18  Yes [provider]  HYDROcodone-acetaminophen (NORCO/VICODIN) 5-325 MG tablet Take 1 tablet by mouth every 6 (six) hours as needed  for moderate pain. Patient not taking: Reported on 11/04/2018 09/25/18   Johnathan Hausen, MD  oxyCODONE (OXY IR/ROXICODONE) 5 MG immediate release tablet Take 1 tablet (5 mg total) by mouth every 6 (six) hours as needed for moderate pain. Patient not taking: Reported on 11/04/2018 09/16/18   Coralie Keens, MD     Vital Signs: BP 126/89 (BP Location: Left Arm)   Pulse (!) 54   Temp 98.2 F (36.8 C) (Oral)   Resp 16   Ht 5\' 1"  (1.549 m)   Wt 241 lb (109.3 kg)   SpO2 99%   BMI 45.54 kg/m   Physical Exam Vitals signs reviewed.  Constitutional:      General: She is not in acute distress.    Appearance: She is obese.  HENT:     Head: Normocephalic.  Cardiovascular:     Rate and Rhythm: Normal rate.  Pulmonary:     Effort: Pulmonary effort is normal.  Abdominal:     Palpations: Abdomen is soft.     Tenderness: There is abdominal tenderness (at drain insertion and midline wound).     Comments: (+) midline wound. (+) abdominal drain to JP with serosanguineous OP. Insertion site clean, dry, dressed appropriately without erythema, edema, drainage or active bleeding.    Skin:    General: Skin is warm and dry.  Neurological:     Mental Status: She is alert. Mental status is at baseline.     Imaging: Ir US Guide Bx Asp/drain  Result Date: 11/09/2018 INDICATION: 38 year old with cellulitis and suspect an infected abdominal wall seroma. Plan for image guided drainage. EXAM: ULTRASOUND-GUIDED DRAIN PLACEMENT IN ABDOMINAL WALL FLUID COLLECTION MEDICATIONS: The patient is currently admitted to the hospital and receiving intravenous antibiotics. The antibiotics were administered within an appropriate time frame prior to the initiation of the procedure. ANESTHESIA/SEDATION: Fentanyl 50 mcg IV; Versed 1.0 mg IV Moderate Sedation Time:  15 minutes The patient was continuously monitored during the procedure by the interventional radiology nurse under my direct supervision. COMPLICATIONS: None  immediate. PROCEDURE: Informed written consent was obtained from the patient after a thorough discussion of the procedural risks, benefits and alternatives. All questions were addressed. Maximal Sterile Barrier Technique was utilized including caps, mask, sterile gowns, sterile gloves, sterile drape, hand hygiene and skin antiseptic. A timeout was performed prior to the initiation of the procedure. Large abdominal wall fluid collection was identified with ultrasound. The left side of the abdomen was prepped and draped in sterile fashion. Skin was anesthetized with 1% lidocaine. 18 gauge trocar needle was directed in the fluid collection with ultrasound guidance. Stiff Amplatz wire was advanced into the collection and the tract was dilated to accommodate a 12 [place] drain. 350 mL of cloudy yellow fluid was removed. A sample was sent for culture. Catheter was sutured to the skin and attached to a suction bulb. FINDINGS: Large midline abdominal wall fluid collection. Echogenic debris in the dependent aspect of the collection. 350 mL of cloudy yellow fluid was removed. The collection was decompressed at the end of the procedure. IMPRESSION: Successful placement of a drain in the abdominal wall fluid collection with ultrasound guidance. Fluid was sent for culture. Electronically Signed   By: Markus Daft M.D.   On: 11/09/2018 15:32    Labs:  CBC: Recent Labs    09/25/18 0254 11/03/18 1542 11/08/18 1859 11/10/18 0154  WBC 6.8 9.6 15.1* 10.4  HGB 11.3* 12.6 10.1* 9.2*  HCT 35.1* 38.8 31.3* 29.0*  PLT 355 393 349 322    COAGS: Recent Labs    09/14/18 1056 11/08/18 1859  INR 0.95 1.2  APTT  --  34    BMP: Recent Labs    09/18/18 0849 11/03/18 1542 11/08/18 1859 11/10/18 0154 11/13/18 0505  NA 136 138 135 137  --   K 4.0 3.7 3.2* 3.9  --   CL 101 105 98 104  --   CO2 26 21* 26 25  --   GLUCOSE 90 98 117* 109*  --   BUN 7 6 5* 6  --   CALCIUM 8.9 8.9 8.7* 8.0*  --   CREATININE 0.93 1.03*  0.94 1.06* 1.01*  GFRNONAA >60 >60 >60 >60 >60  GFRAA >60 >60 >60 >60 >60    LIVER FUNCTION TESTS: Recent Labs    07/05/18 1210 07/18/18 1614 11/03/18 1542 11/08/18 1859  BILITOT 0.7 0.6 0.5 0.6  AST 32 23 27 20   ALT 12 12 16 20   ALKPHOS 56 56 69 76  PROT 6.6 6.3* 7.9 7.0  ALBUMIN 3.4* 3.4* 3.8 2.7*    Assessment and Plan:  38 y/o F s/p abdominal wall seroma drain placed 3/4 by Dr. Anselm Pancoast - OP 55 cc in last 24H, more serous appearing on exam today. Cx from aspirate (+) staph aureus and strep - currently on clindamycin per primary team. Per patient possibly d/c upcoming - orders placed today for outpatient follow up in IR clinic (approximately 10-14 days from placement). Patient will need to  flush QD with 5 cc NS and record OP once daily.  IR will continue to follow while inpatient. Continue TID flushes with 3-5 cc NS, record drain output daily  Please call with questions or concerns.  Electronically Signed: Joaquim Nam, PA-C 11/13/2018, 11:01 AM   I spent a total of 15 Minutes at the the patient's bedside AND on the patient's hospital floor or unit, greater than 50% of which was counseling/coordinating care for abdominal drain follow up.

## 2018-11-13 NOTE — Plan of Care (Signed)
  Problem: Elimination: ?Goal: Will not experience complications related to bowel motility ?Outcome: Not Progressing ?  ?Problem: Pain Managment: ?Goal: General experience of comfort will improve ?Outcome: Not Progressing ?  ?

## 2018-11-14 ENCOUNTER — Encounter (HOSPITAL_COMMUNITY): Payer: Self-pay

## 2018-11-14 LAB — AEROBIC/ANAEROBIC CULTURE W GRAM STAIN (SURGICAL/DEEP WOUND)

## 2018-11-14 MED ORDER — BISACODYL 10 MG RE SUPP
10.0000 mg | Freq: Once | RECTAL | Status: AC
  Administered 2018-11-14: 10 mg via RECTAL
  Filled 2018-11-14: qty 1

## 2018-11-14 NOTE — Progress Notes (Signed)
   Subjective/Chief Complaint: Nausea and episode of emesis over the weekend IV out On oral antibiotics   Objective: Vital signs in last 24 hours: Temp:  [97.4 F (36.3 C)-97.8 F (36.6 C)] 97.4 F (36.3 C) (03/09 0528) Pulse Rate:  [57-66] 66 (03/09 0528) Resp:  [17-18] 18 (03/09 0528) BP: (108-137)/(68-100) 108/68 (03/09 0528) SpO2:  [96 %-99 %] 96 % (03/09 0528) Last BM Date: 11/08/18  Intake/Output from previous day: 03/08 0701 - 03/09 0700 In: 15 [I.V.:10] Out: 20 [Drains:20] Intake/Output this shift: Total I/O In: 240 [P.O.:240] Out: -   Exam: Abdomen morbidly obese, drain serosang Less edema of the abdominal wall and induration Erythema almost resolved  Lab Results:  No results for input(s): WBC, HGB, HCT, PLT in the last 72 hours. BMET Recent Labs    11/13/18 0505  CREATININE 1.01*   PT/INR No results for input(s): LABPROT, INR in the last 72 hours. ABG No results for input(s): PHART, HCO3 in the last 72 hours.  Invalid input(s): PCO2, PO2  Studies/Results: No results found.  Anti-infectives: Anti-infectives (From admission, onward)   Start     Dose/Rate Route Frequency Ordered Stop   11/13/18 1300  clindamycin (CLEOCIN) capsule 300 mg     300 mg Oral Every 6 hours 11/13/18 1017     11/10/18 1300  vancomycin (VANCOCIN) 1,500 mg in sodium chloride 0.9 % 500 mL IVPB  Status:  Discontinued     1,500 mg 250 mL/hr over 120 Minutes Intravenous Every 24 hours 11/09/18 1218 11/13/18 1017   11/09/18 1230  vancomycin (VANCOCIN) 2,000 mg in sodium chloride 0.9 % 500 mL IVPB     2,000 mg 250 mL/hr over 120 Minutes Intravenous  Once 11/09/18 1209 11/09/18 1501      Assessment/Plan: Abdominal wall cellulitis with infected seroma  Will hold on restarting an IV Try suppository for BM Hopefully home tomorrow if pain, nausea improved. Will go home with the drain  Coralie Keens 11/14/2018

## 2018-11-15 MED ORDER — CLINDAMYCIN HCL 150 MG PO CAPS: 300.0000 mg | ORAL_CAPSULE | Freq: Three times a day (TID) | ORAL | 0 refills | Status: AC

## 2018-11-15 MED ORDER — TRAMADOL HCL 50 MG PO TABS: 50.0000 mg | ORAL_TABLET | Freq: Four times a day (QID) | ORAL | 0 refills | Status: DC | PRN

## 2018-11-15 NOTE — Discharge Summary (Signed)
Physician Discharge Summary  Patient ID: [name] MRN: [id number] DOB/AGE: October 09, 1980 38 y.o.  Admit date: 11/08/2018 Discharge date: 11/15/2018  Admission Diagnoses:  Discharge Diagnoses:  Active Problems:   Abdominal wall seroma abdominal wall cellulitis  Discharged Condition: good  Hospital Course: The patient was admitted with abdominal wall cellulitis and infection of a seroma in the abdominal wall.  She was placed on IV antibiotics.  Interventional radiology was asked to see the patient and placed a drain in the seroma.  Cultures were positive for staph.  She improved on antibiotics which were eventually changed to oral antibiotics.  The erythema and edema of her abdominal wall improved.  The decision was then made to discharge her to home with the drain in place  Consults: Interventional radiology  Significant Diagnostic Studies:   Treatments: IV antibiotics  Discharge Exam: Blood pressure 105/64, pulse (!) 58, temperature 98.4 F (36.9 C), temperature source Oral, resp. rate 18, height 5\' 1"  (1.549 m), weight 109.3 kg, SpO2 98 %. General appearance: alert, cooperative and no distress Incision/Wound: abdomen soft, morbidly obese. Drain serosang. Erythema and edema improved  Disposition: Discharge disposition: 01-Home or Self Care        Allergies as of 11/15/2018      Reactions   Penicillins Anaphylaxis, Hives, Rash   PATIENT HAD A PCN REACTION WITH IMMEDIATE RASH, FACIAL/TONGUE/THROAT SWELLING, SOB, OR LIGHTHEADEDNESS WITH HYPOTENSION:   YES  Reaction causing SEVERE RASH INVOLVING MUCUS MEMBRANES or SKIN NECROSIS: #  #  #  YES  #  #  #   PATIENT HAS HAD A PCN REACTION THAT REQUIRED HOSPITALIZATION:  #  #  #  YES  #  #  #  Has patient had a PCN reaction occurring within the last 10 years: No   Pollen Extract Hives, Itching   All-over hives      Medication List    STOP taking these medications   HYDROcodone-acetaminophen 5-325 MG tablet Commonly known  as:  NORCO/VICODIN   oxyCODONE 5 MG immediate release tablet Commonly known as:  Oxy IR/ROXICODONE     TAKE these medications   ARIPiprazole 5 MG tablet Commonly known as:  ABILIFY Take 5 mg by mouth daily.   clindamycin 150 MG capsule Commonly known as:  CLEOCIN Take 2 capsules (300 mg total) by mouth 3 (three) times daily for 10 days.   DULoxetine 30 MG capsule Commonly known as:  CYMBALTA Take 30 mg by mouth 2 (two) times daily.   ibuprofen 200 MG tablet Commonly known as:  ADVIL,MOTRIN Take 400 mg by mouth every 6 (six) hours as needed (for pain).   levothyroxine 112 MCG tablet Commonly known as:  SYNTHROID, LEVOTHROID Take 112 mcg by mouth daily before breakfast.   lisinopril 5 MG tablet Commonly known as:  PRINIVIL,ZESTRIL Take 5 mg by mouth daily.   loratadine 10 MG tablet Commonly known as:  CLARITIN Take 10 mg by mouth daily as needed for allergies.   metoprolol tartrate 50 MG tablet Commonly known as:  LOPRESSOR Take 50 mg by mouth 2 (two) times daily.   traMADol 50 MG tablet Commonly known as:  ULTRAM Take 50 mg by mouth every 6 (six) hours as needed for pain. What changed:  Another medication with the same name was added. Make sure you understand how and when to take each.   traMADol 50 MG tablet Commonly known as:  ULTRAM Take 1 tablet (50 mg total) by mouth every 6 (six) hours as needed for  moderate pain or severe pain. What changed:  You were already taking a medication with the same name, and this prescription was added. Make sure you understand how and when to take each.   Xarelto 20 MG Tabs tablet Generic drug:  rivaroxaban Take 20 mg by mouth every evening.   zolpidem 10 MG tablet Commonly known as:  AMBIEN Take 10 mg by mouth at bedtime as needed for sleep.      Follow-up Information    Coralie Keens, MD. Schedule an appointment as soon as possible for a visit on 11/18/2018.   Specialty:  General Surgery Contact information: [place] [address] [phone]           Signed: Coralie Keens 11/15/2018, 7:56 AM

## 2018-11-15 NOTE — Discharge Instructions (Signed)
Call office for appointment time this Friday [phone]  Record drain output daily  Continue current wound care

## 2018-11-15 NOTE — Progress Notes (Signed)
Patient ID: [name], female   DOB: Sep 09, 1980, 38 y.o.   MRN: [id number]   Feeling much better Wants to go home No fever abd pain, tenderness, erythema less  Plan: D/c today with drain F/u this friday

## 2018-11-17 ENCOUNTER — Other Ambulatory Visit: Payer: Self-pay | Admitting: Surgery

## 2018-11-17 DIAGNOSIS — S301XXA Contusion of abdominal wall, initial encounter: Secondary | ICD-10-CM

## 2018-11-23 ENCOUNTER — Other Ambulatory Visit: Payer: Medicaid Other

## 2018-11-24 ENCOUNTER — Other Ambulatory Visit: Payer: Medicaid Other

## 2018-11-30 ENCOUNTER — Other Ambulatory Visit: Payer: Self-pay

## 2018-11-30 ENCOUNTER — Ambulatory Visit
Admission: RE | Admit: 2018-11-30 | Discharge: 2018-11-30 | Disposition: A | Discharge status: Home / Self Care | Payer: Medicaid Other | Source: Ambulatory Visit | Attending: Surgery | Admitting: Surgery

## 2018-11-30 ENCOUNTER — Encounter: Payer: Self-pay | Admitting: *Deleted

## 2018-11-30 DIAGNOSIS — S301XXA Contusion of abdominal wall, initial encounter: Secondary | ICD-10-CM

## 2018-11-30 HISTORY — PX: IR RADIOLOGIST EVAL & MGMT: IMG5224

## 2018-11-30 MED ORDER — IOPAMIDOL (ISOVUE-300) INJECTION 61%
100.0000 mL | Freq: Once | INTRAVENOUS | Status: AC | PRN
  Administered 2018-11-30: 100 mL via INTRAVENOUS

## 2018-11-30 NOTE — Progress Notes (Addendum)
Referring Physician(s): Blackman,Douglas  Chief Complaint: The patient is seen in follow up today s/p abdominal seroma drain placement 3/4   History of present illness:  Previous incisional hernia with mesh placed 09/14/18 with Dr [name]. Drain had come out of collection-- developed abd pain and fever. Was referred to IR for new drain placement Placed 11/09/18  Now here today for CT and evaluation.  Pt states was flushing and doing well until "drain popped out Monday" Finished Clindamycin yesterday To see Dr [name] tomorrow  Denies fever/chills Denies pain   Past Medical History:  Diagnosis Date  . Complication of anesthesia    "difficulty waking up and will fight when woken up"  . Depression   . Gallstones   . Hernia, ventral   . History of blood clots   . History of pulmonary embolus (PE)   . Hypertension   . Hyperthyroidism    01/15/17- in the past  . Shortness of breath dyspnea     Past Surgical History:  Procedure Laterality Date  . BREAST SURGERY     cyst removed from right  . CESAREAN SECTION    . CHOLECYSTECTOMY  01/18/2017  . CHOLECYSTECTOMY N/A 01/18/2017   Procedure: LAPAROSCOPIC CHOLECYSTECTOMY;  Surgeon: Coralie Keens, MD;  Location: [place];  Service: General;  Laterality: N/A;  . HERNIA REPAIR    . INCISIONAL HERNIA REPAIR  04/13/2016  . INCISIONAL HERNIA REPAIR N/A 04/13/2016   Procedure: INCISIONAL HERNIA REPAIR;  Surgeon: Coralie Keens, MD;  Location: [place];  Service: General;  Laterality: N/A;  . [place]  09/14/2018  . INCISIONAL HERNIA REPAIR N/A 09/14/2018   Procedure: INCISIONAL HERNIA REPAIR WITH MESH, POTENTIAL A-CELL XENOGRAFT MESH ERAS PATHWAY;  Surgeon: Coralie Keens, MD;  Location: [place];  Service: General;  Laterality: N/A;  . INSERTION OF MESH N/A 04/13/2016   Procedure: INSERTION OF MESH;  Surgeon: Coralie Keens, MD;  Location: [place];  Service: General;  Laterality: N/A;  . INSERTION OF MESH N/A 09/14/2018   Procedure: INSERTION OF MESH;  Surgeon: Coralie Keens, MD;  Location: [place];  Service: General;  Laterality: N/A;  . LAPAROTOMY N/A 03/05/2017   Procedure: EXPLORATORY LAPAROTOMY;  Surgeon: Rolm Bookbinder, MD;  Location: [place];  Service: General;  Laterality: N/A;  . LYSIS OF ADHESION N/A 03/05/2017   Procedure: LYSIS OF ADHESIONs;  Surgeon: Rolm Bookbinder, MD;  Location: [place];  Service: General;  Laterality: N/A;  . PILONIDAL CYST / SINUS EXCISION    . removal of sweat gland Bilateral    arms    Allergies: Penicillins and Pollen extract  Medications: Prior to Admission medications   Medication Sig Start Date End Date Taking? Authorizing Provider  ARIPiprazole (ABILIFY) 5 MG tablet Take 5 mg by mouth daily.    [provider]  DULoxetine (CYMBALTA) 30 MG capsule Take 30 mg by mouth 2 (two) times daily. 06/28/18   [provider]  ibuprofen (ADVIL,MOTRIN) 200 MG tablet Take 400 mg by mouth every 6 (six) hours as needed (for pain).    [provider]  levothyroxine (SYNTHROID, LEVOTHROID) 112 MCG tablet Take 112 mcg by mouth daily before breakfast.  07/15/17   [provider]  lisinopril (PRINIVIL,ZESTRIL) 5 MG tablet Take 5 mg by mouth daily. 11/11/15   [provider]  loratadine (CLARITIN) 10 MG tablet Take 10 mg by mouth daily as needed for allergies.    [provider]  metoprolol (LOPRESSOR) 50 MG tablet Take 50 mg by mouth  2 (two) times daily. 09/12/15   [provider]  Rivaroxaban (XARELTO) 20 MG TABS Take 20 mg by mouth every evening.     [provider]  traMADol (ULTRAM) 50 MG tablet Take 50 mg by mouth every 6 (six) hours as needed for pain. 10/21/18   [provider]  traMADol (ULTRAM) 50 MG tablet Take 1 tablet (50 mg total) by mouth every 6 (six) hours as needed for moderate pain or severe pain. 11/15/18   Coralie Keens, MD  zolpidem (AMBIEN) 10 MG tablet Take 10 mg by mouth at bedtime as  needed for sleep.  06/28/18   [provider]     Family History  Problem Relation Age of Onset  . Hypertension Other   . Hyperlipidemia Other     Social History   Socioeconomic History  . Marital status: Married    Spouse name: Not on file  . Number of children: Not on file  . Years of education: Not on file  . Highest education level: Not on file  Occupational History  . Not on file  Social Needs  . Financial resource strain: Not on file  . Food insecurity:    Worry: Not on file    Inability: Not on file  . Transportation needs:    Medical: Not on file    Non-medical: Not on file  Tobacco Use  . Smoking status: Never Smoker  . Smokeless tobacco: Never Used  Substance and Sexual Activity  . Alcohol use: Yes    Comment: occasionally  . Drug use: No  . Sexual activity: Not on file  Lifestyle  . Physical activity:    Days per week: Not on file    Minutes per session: Not on file  . Stress: Not on file  Relationships  . Social connections:    Talks on phone: Not on file    Gets together: Not on file    Attends religious service: Not on file    Active member of club or organization: Not on file    Attends meetings of clubs or organizations: Not on file    Relationship status: Not on file  Other Topics Concern  . Not on file  Social History Narrative  . Not on file     Vital Signs: There were no vitals taken for this visit.  Physical Exam Vitals signs reviewed.  Skin:    General: Skin is warm and dry.     Comments: Site is clean and dry NT Drain is out  Neurological:     Mental Status: She is alert.   CT shows persistent seroma collection per Dr Pascal Lux   Imaging: No results found.  Labs:  CBC: Recent Labs    09/25/18 0254 11/03/18 1542 11/08/18 1859 11/10/18 0154  WBC 6.8 9.6 15.1* 10.4  HGB 11.3* 12.6 10.1* 9.2*  HCT 35.1* 38.8 31.3* 29.0*  PLT 355 393 349 322    COAGS: Recent Labs    09/14/18 1056 11/08/18 1859  INR  0.95 1.2  APTT  --  34    BMP: Recent Labs    09/18/18 0849 11/03/18 1542 11/08/18 1859 11/10/18 0154 11/13/18 0505  NA 136 138 135 137  --   K 4.0 3.7 3.2* 3.9  --   CL 101 105 98 104  --   CO2 26 21* 26 25  --   GLUCOSE 90 98 117* 109*  --   BUN 7 6 5* 6  --  CALCIUM 8.9 8.9 8.7* 8.0*  --   CREATININE 0.93 1.03* 0.94 1.06* 1.01*  GFRNONAA >60 >60 >60 >60 >60  GFRAA >60 >60 >60 >60 >60    LIVER FUNCTION TESTS: Recent Labs    07/05/18 1210 07/18/18 1614 11/03/18 1542 11/08/18 1859  BILITOT 0.7 0.6 0.5 0.6  AST 32 23 27 20   ALT 12 12 16 20   ALKPHOS 56 56 69 76  PROT 6.6 6.3* 7.9 7.0  ALBUMIN 3.4* 3.4* 3.8 2.7*    Assessment:  Abdominal seroma placed 11/09/18 in IR Came out at home per pt 3/22 CT today reveals persistent collection Has appt with Dr [name] tomorrow Dr Pascal Lux spoke to Dr [name] Plan for him to see her in office-- she is agreeable  Signed: Lavonia Drafts, PA-C 11/30/2018, 1:10 PM   Please refer to Dr. Pascal Lux attestation of this note for management and plan.

## 2018-12-02 ENCOUNTER — Encounter (HOSPITAL_COMMUNITY): Payer: Self-pay | Admitting: Emergency Medicine

## 2018-12-02 ENCOUNTER — Inpatient Hospital Stay (HOSPITAL_COMMUNITY)
Admission: EM | Admit: 2018-12-02 | Discharge: 2018-12-05 | DRG: 908 | Disposition: A | Discharge status: Home / Self Care | Payer: Medicaid Other | Attending: Surgery | Admitting: Surgery

## 2018-12-02 ENCOUNTER — Other Ambulatory Visit: Payer: Self-pay

## 2018-12-02 ENCOUNTER — Emergency Department (HOSPITAL_COMMUNITY): Payer: Medicaid Other

## 2018-12-02 DIAGNOSIS — E039 Hypothyroidism, unspecified: Secondary | ICD-10-CM | POA: Diagnosis present

## 2018-12-02 DIAGNOSIS — R109 Unspecified abdominal pain: Secondary | ICD-10-CM

## 2018-12-02 DIAGNOSIS — Z880 Allergy status to penicillin: Secondary | ICD-10-CM | POA: Diagnosis not present

## 2018-12-02 DIAGNOSIS — Z86718 Personal history of other venous thrombosis and embolism: Secondary | ICD-10-CM | POA: Diagnosis not present

## 2018-12-02 DIAGNOSIS — Z79899 Other long term (current) drug therapy: Secondary | ICD-10-CM | POA: Diagnosis not present

## 2018-12-02 DIAGNOSIS — Z6841 Body Mass Index (BMI) 40.0 and over, adult: Secondary | ICD-10-CM

## 2018-12-02 DIAGNOSIS — L7634 Postprocedural seroma of skin and subcutaneous tissue following other procedure: Principal | ICD-10-CM | POA: Diagnosis present

## 2018-12-02 DIAGNOSIS — Z79890 Hormone replacement therapy (postmenopausal): Secondary | ICD-10-CM | POA: Diagnosis not present

## 2018-12-02 DIAGNOSIS — Z86711 Personal history of pulmonary embolism: Secondary | ICD-10-CM | POA: Diagnosis not present

## 2018-12-02 DIAGNOSIS — I10 Essential (primary) hypertension: Secondary | ICD-10-CM | POA: Diagnosis present

## 2018-12-02 DIAGNOSIS — Z8249 Family history of ischemic heart disease and other diseases of the circulatory system: Secondary | ICD-10-CM

## 2018-12-02 DIAGNOSIS — S301XXA Contusion of abdominal wall, initial encounter: Secondary | ICD-10-CM | POA: Diagnosis present

## 2018-12-02 DIAGNOSIS — Z91048 Other nonmedicinal substance allergy status: Secondary | ICD-10-CM

## 2018-12-02 DIAGNOSIS — Z87892 Personal history of anaphylaxis: Secondary | ICD-10-CM

## 2018-12-02 DIAGNOSIS — Z9049 Acquired absence of other specified parts of digestive tract: Secondary | ICD-10-CM | POA: Diagnosis not present

## 2018-12-02 DIAGNOSIS — Y838 Other surgical procedures as the cause of abnormal reaction of the patient, or of later complication, without mention of misadventure at the time of the procedure: Secondary | ICD-10-CM | POA: Diagnosis present

## 2018-12-02 LAB — CBC WITH DIFFERENTIAL/PLATELET
ABS IMMATURE GRANULOCYTES: 0 10*3/uL (ref 0.00–0.07)
Basophils Absolute: 0 10*3/uL (ref 0.0–0.1)
Basophils Relative: 0 %
Eosinophils Absolute: 0 10*3/uL (ref 0.0–0.5)
Eosinophils Relative: 0 %
HCT: 34.1 % — ABNORMAL LOW (ref 36.0–46.0)
Hemoglobin: 11 g/dL — ABNORMAL LOW (ref 12.0–15.0)
Lymphocytes Relative: 34 %
Lymphs Abs: 1.5 10*3/uL (ref 0.7–4.0)
MCH: 32.3 pg (ref 26.0–34.0)
MCHC: 32.3 g/dL (ref 30.0–36.0)
MCV: 100 fL (ref 80.0–100.0)
MONOS PCT: 8 %
Monocytes Absolute: 0.4 10*3/uL (ref 0.1–1.0)
Neutro Abs: 2.6 10*3/uL (ref 1.7–7.7)
Neutrophils Relative %: 58 %
Platelets: 269 10*3/uL (ref 150–400)
RBC: 3.41 MIL/uL — ABNORMAL LOW (ref 3.87–5.11)
RDW: 13.4 % (ref 11.5–15.5)
WBC: 4.5 10*3/uL (ref 4.0–10.5)
nRBC: 0 % (ref 0.0–0.2)
nRBC: 0 /100 WBC

## 2018-12-02 LAB — COMPREHENSIVE METABOLIC PANEL
ALT: 9 U/L (ref 0–44)
AST: 17 U/L (ref 15–41)
Albumin: 3.3 g/dL — ABNORMAL LOW (ref 3.5–5.0)
Alkaline Phosphatase: 59 U/L (ref 38–126)
Anion gap: 7 (ref 5–15)
BUN: 9 mg/dL (ref 6–20)
CHLORIDE: 103 mmol/L (ref 98–111)
CO2: 28 mmol/L (ref 22–32)
CREATININE: 1.01 mg/dL — AB (ref 0.44–1.00)
Calcium: 9.2 mg/dL (ref 8.9–10.3)
GFR calc Af Amer: >60 mL/min (ref >60)
GFR calc non Af Amer: >60 mL/min (ref >60)
Glucose, Bld: 89 mg/dL (ref 70–99)
Potassium: 4.1 mmol/L (ref 3.5–5.1)
Sodium: 138 mmol/L (ref 135–145)
Total Bilirubin: 0.5 mg/dL (ref 0.3–1.2)
Total Protein: 6.9 g/dL (ref 6.5–8.1)

## 2018-12-02 LAB — LIPASE, BLOOD: Lipase: 16 U/L (ref 11–51)

## 2018-12-02 MED ORDER — LEVOTHYROXINE SODIUM 112 MCG PO TABS
112.0000 ug | ORAL_TABLET | Freq: Every day | ORAL | Status: DC
  Administered 2018-12-03 – 2018-12-05 (×2): 112 ug via ORAL
  Filled 2018-12-02 (×4): qty 1

## 2018-12-02 MED ORDER — IOHEXOL 300 MG/ML  SOLN
100.0000 mL | Freq: Once | INTRAMUSCULAR | Status: AC | PRN
  Administered 2018-12-02: 100 mL via INTRAVENOUS

## 2018-12-02 MED ORDER — FENTANYL CITRATE (PF) 100 MCG/2ML IJ SOLN: 50.0000 ug | INTRAMUSCULAR | Status: DC | PRN

## 2018-12-02 MED ORDER — HYDRALAZINE HCL 20 MG/ML IJ SOLN: 10.0000 mg | INTRAMUSCULAR | Status: DC | PRN

## 2018-12-02 MED ORDER — DEXTROSE-NACL 5-0.9 % IV SOLN: INTRAVENOUS | Status: DC

## 2018-12-02 MED ORDER — LORATADINE 10 MG PO TABS
10.0000 mg | ORAL_TABLET | Freq: Every day | ORAL | Status: DC | PRN
  Administered 2018-12-05: 10 mg via ORAL
  Filled 2018-12-02: qty 1

## 2018-12-02 MED ORDER — DEXTROSE-NACL 5-0.9 % IV SOLN
INTRAVENOUS | Status: DC
  Administered 2018-12-02 – 2018-12-03 (×2): via INTRAVENOUS

## 2018-12-02 MED ORDER — OXYCODONE HCL 5 MG PO TABS
5.0000 mg | ORAL_TABLET | ORAL | Status: DC | PRN
  Administered 2018-12-02 – 2018-12-05 (×9): 10 mg via ORAL
  Filled 2018-12-02 (×9): qty 2

## 2018-12-02 MED ORDER — TRAMADOL HCL 50 MG PO TABS
50.0000 mg | ORAL_TABLET | Freq: Four times a day (QID) | ORAL | Status: DC | PRN
  Administered 2018-12-02: 50 mg via ORAL
  Filled 2018-12-02: qty 1

## 2018-12-02 MED ORDER — ARIPIPRAZOLE 10 MG PO TABS
5.0000 mg | ORAL_TABLET | Freq: Every day | ORAL | Status: DC
  Administered 2018-12-02 – 2018-12-04 (×3): 5 mg via ORAL
  Filled 2018-12-02 (×3): qty 1

## 2018-12-02 MED ORDER — LISINOPRIL 5 MG PO TABS
5.0000 mg | ORAL_TABLET | Freq: Every day | ORAL | Status: DC
  Administered 2018-12-02 – 2018-12-05 (×4): 5 mg via ORAL
  Filled 2018-12-02 (×4): qty 1

## 2018-12-02 MED ORDER — FENTANYL CITRATE (PF) 100 MCG/2ML IJ SOLN
50.0000 ug | Freq: Once | INTRAMUSCULAR | Status: AC
  Administered 2018-12-02: 50 ug via INTRAVENOUS
  Filled 2018-12-02: qty 2

## 2018-12-02 MED ORDER — RIVAROXABAN 20 MG PO TABS
20.0000 mg | ORAL_TABLET | Freq: Every evening | ORAL | Status: DC
  Administered 2018-12-02: 20 mg via ORAL
  Filled 2018-12-02: qty 1

## 2018-12-02 MED ORDER — ZOLPIDEM TARTRATE 5 MG PO TABS
5.0000 mg | ORAL_TABLET | Freq: Every evening | ORAL | Status: DC | PRN
  Administered 2018-12-03: 5 mg via ORAL
  Filled 2018-12-02: qty 1

## 2018-12-02 MED ORDER — ONDANSETRON HCL 4 MG/2ML IJ SOLN
4.0000 mg | Freq: Four times a day (QID) | INTRAMUSCULAR | Status: DC | PRN
  Administered 2018-12-02 – 2018-12-04 (×2): 4 mg via INTRAVENOUS
  Filled 2018-12-02: qty 2

## 2018-12-02 MED ORDER — METOPROLOL TARTRATE 50 MG PO TABS
50.0000 mg | ORAL_TABLET | Freq: Two times a day (BID) | ORAL | Status: DC
  Administered 2018-12-02 – 2018-12-05 (×5): 50 mg via ORAL
  Filled 2018-12-02 (×6): qty 1

## 2018-12-02 MED ORDER — OXYCODONE HCL 5 MG PO TABS: 5.0000 mg | ORAL_TABLET | ORAL | Status: DC | PRN

## 2018-12-02 MED ORDER — DULOXETINE HCL 30 MG PO CPEP
30.0000 mg | ORAL_CAPSULE | Freq: Two times a day (BID) | ORAL | Status: DC
  Administered 2018-12-02 – 2018-12-05 (×6): 30 mg via ORAL
  Filled 2018-12-02 (×6): qty 1

## 2018-12-02 MED ORDER — ONDANSETRON 4 MG PO TBDP: 4.0000 mg | ORAL_TABLET | Freq: Four times a day (QID) | ORAL | Status: DC | PRN

## 2018-12-02 NOTE — ED Provider Notes (Signed)
[facility] EMERGENCY DEPARTMENT Provider Note   CSN: [id number] Arrival date & time: 12/02/18  1455    History   Chief Complaint Chief Complaint  Patient presents with  . Post-op Follow-up    HPI [name] is a 38 y.o. female.     Patient presents with persistent abdominal wall pain to the right of the umbilicus.  She is status post 09/14/2018 ventral hernia repair; complicated with a wound dehiscence on 09/18/2018; and abdominal wall seroma on 11/08/2018.  CT scan on 11/30/2018 reveals a fluid collection in the anterior subcutaneous tissue with inflammation.  She was seen by Dr. [name] yesterday in the office and he attempted to aspirate this area.  He stated to the patient if she was worse today, go to the emergency department.  She feels her symptoms are worse.  Severity is moderate.  Palpation makes symptoms worse.  Review of systems positive for questionable fever with chills.       Past Medical History:  Diagnosis Date  . Complication of anesthesia    "difficulty waking up and will fight when woken up"  . Depression   . Gallstones   . Hernia, ventral   . History of blood clots   . History of pulmonary embolus (PE)   . Hypertension   . Hyperthyroidism    01/15/17- in the past  . Shortness of breath dyspnea     Patient Active Problem List   Diagnosis Date Noted  . Abdominal wall seroma 11/08/2018  . Wound dehiscence 09/23/2018  . Pre-operative cardiovascular examination 08/09/2018  . Left upper quadrant pain 03/03/2017  . Generalized abdominal wall pain 03/03/2017  . Obesity, Class III, BMI 40-49.9 (morbid obesity) ([place]) 03/03/2017  . Small bowel obstruction due to adhesions ([place]) 02/24/2017  . Essential hypertension 02/24/2017  . History of pulmonary embolism 02/24/2017  . Hypothyroidism 02/24/2017  . S/P laparoscopic cholecystectomy 01/18/2017  . Post op infection 05/03/2016  . Incisional hernia 04/13/2016    Past Surgical History:   Procedure Laterality Date  . BREAST SURGERY     cyst removed from right  . CESAREAN SECTION    . CHOLECYSTECTOMY  01/18/2017  . CHOLECYSTECTOMY N/A 01/18/2017   Procedure: LAPAROSCOPIC CHOLECYSTECTOMY;  Surgeon: Coralie Keens, MD;  Location: [place];  Service: General;  Laterality: N/A;  . HERNIA REPAIR    . INCISIONAL HERNIA REPAIR  04/13/2016  . INCISIONAL HERNIA REPAIR N/A 04/13/2016   Procedure: INCISIONAL HERNIA REPAIR;  Surgeon: Coralie Keens, MD;  Location: [place];  Service: General;  Laterality: N/A;  . [place]  09/14/2018  . INCISIONAL HERNIA REPAIR N/A 09/14/2018   Procedure: INCISIONAL HERNIA REPAIR WITH MESH, POTENTIAL A-CELL XENOGRAFT MESH ERAS PATHWAY;  Surgeon: Coralie Keens, MD;  Location: [place];  Service: General;  Laterality: N/A;  . INSERTION OF MESH N/A 04/13/2016   Procedure: INSERTION OF MESH;  Surgeon: Coralie Keens, MD;  Location: [place];  Service: General;  Laterality: N/A;  . INSERTION OF MESH N/A 09/14/2018   Procedure: INSERTION OF MESH;  Surgeon: Coralie Keens, MD;  Location: [place];  Service: General;  Laterality: N/A;  . IR RADIOLOGIST EVAL & MGMT  11/30/2018  . LAPAROTOMY N/A 03/05/2017   Procedure: EXPLORATORY LAPAROTOMY;  Surgeon: Rolm Bookbinder, MD;  Location: [facility];  Service: General;  Laterality: N/A;  . LYSIS OF ADHESION N/A 03/05/2017   Procedure: LYSIS OF ADHESIONs;  Surgeon: Rolm Bookbinder, MD;  Location: [place];  Service: General;  Laterality: N/A;  .  PILONIDAL CYST / SINUS EXCISION    . removal of sweat gland Bilateral    arms     OB History   No obstetric history on file.      Home Medications    Prior to Admission medications   Medication Sig Start Date End Date Taking? Authorizing Provider  acetaminophen (TYLENOL) 500 MG tablet Take 500 mg by mouth every 6 (six) hours as needed for mild pain.   Yes [provider]  ARIPiprazole (ABILIFY) 5 MG tablet Take 5 mg by mouth daily.   Yes [provider]  DULoxetine (CYMBALTA) 30 MG capsule Take 30 mg by mouth 2 (two) times daily. 06/28/18  Yes [provider]  levothyroxine (SYNTHROID, LEVOTHROID) 112 MCG tablet Take 112 mcg by mouth daily before breakfast.  07/15/17  Yes [provider]  lisinopril (PRINIVIL,ZESTRIL) 5 MG tablet Take 5 mg by mouth daily. 11/11/15  Yes [provider]  loratadine (CLARITIN) 10 MG tablet Take 10 mg by mouth daily as needed for allergies.   Yes [provider]  metoprolol (LOPRESSOR) 50 MG tablet Take 50 mg by mouth 2 (two) times daily. 09/12/15  Yes [provider]  Rivaroxaban (XARELTO) 20 MG TABS Take 20 mg by mouth every evening.    Yes [provider]  traMADol (ULTRAM) 50 MG tablet Take 1 tablet (50 mg total) by mouth every 6 (six) hours as needed for moderate pain or severe pain. 11/15/18  Yes Coralie Keens, MD  zolpidem (AMBIEN) 10 MG tablet Take 10 mg by mouth at bedtime as needed for sleep.  06/28/18  Yes [provider]    Family History Family History  Problem Relation Age of Onset  . Hypertension Other   . Hyperlipidemia Other     Social History Social History   Tobacco Use  . Smoking status: Never Smoker  . Smokeless tobacco: Never Used  Substance Use Topics  . Alcohol use: Yes    Comment: occasionally  . Drug use: No     Allergies   Penicillins and Pollen extract   Review of Systems Review of Systems  All other systems reviewed and are negative.    Physical Exam Updated Vital Signs BP (!) 148/91   Pulse 70   Temp 98.2 F (36.8 C)   Resp 18   Ht 5\' 1"  (1.549 m)   Wt 108.9 kg   LMP 09/02/2018   SpO2 100%   BMI 45.35 kg/m   Physical Exam Vitals signs and nursing note reviewed.  Constitutional:      Appearance: She is well-developed.     Comments: Elevated bmi;  nad  HENT:     Head: Normocephalic and atraumatic.  Eyes:     Conjunctiva/sclera: Conjunctivae normal.  Neck:      Musculoskeletal: Neck supple.  Cardiovascular:     Rate and Rhythm: Normal rate and regular rhythm.  Pulmonary:     Effort: Pulmonary effort is normal.     Breath sounds: Normal breath sounds.  Abdominal:     General: Bowel sounds are normal.     Palpations: Abdomen is soft.     Comments: Healing vertical midline scar with induration especially in the superior aspect of the scar.  Tenderness to the right of the umbilicus.  No obvious erythema.  Musculoskeletal: Normal range of motion.  Skin:    General: Skin is warm and dry.  Neurological:     Mental Status: She is alert and oriented to person, place,  and time.  Psychiatric:        Behavior: Behavior normal.      ED Treatments / Results  Labs (all labs ordered are listed, but only abnormal results are displayed) Labs Reviewed - No data to display  EKG None  Radiology No results found.  Procedures Procedures (including critical care time)  Medications Ordered in ED Medications - No data to display   Initial Impression / Assessment and Plan / ED Course  I have reviewed the triage vital signs and the nursing notes.  Pertinent labs & imaging results that were available during my care of the patient were reviewed by me and considered in my medical decision making (see chart for details).        Patient with complications from a ventral hernia repair on January 8.  Will consult general surgery for advice.  Final Clinical Impressions(s) / ED Diagnoses   Final diagnoses:  Abdominal wall pain    ED Discharge Orders    None       Nat Christen, MD 12/02/18 [phone]

## 2018-12-02 NOTE — H&P (Signed)
[name] is an 38 y.o. female.   Chief Complaint: Abdominal pain HPI: Patient presents emergency room chief complaint of abdominal pain.  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 Coralie Keens.  He has seen her in follow-up as an outpatient and she is actually had previous admissions over the last 4 weeks for percutaneous drainage.  She had a CT scan 2 days ago which showed a fluid collection which is anterior to her abdominal wall.  This was aspirated yesterday but her pain worsened prompting her to come the emergency room.  She has no nausea vomiting complains of pain at the operative site.  CT scan shows a seroma with small air bubbles.  Her white count is normal but her pain is 5 out of 10 constant and worse than it was yesterday.  Past Medical History:  Diagnosis Date  . Complication of anesthesia    "difficulty waking up and will fight when woken up"  . Depression   . Gallstones   . Hernia, ventral   . History of blood clots   . History of pulmonary embolus (PE)   . Hypertension   . Hyperthyroidism    01/15/17- in the past  . Shortness of breath dyspnea     Past Surgical History:  Procedure Laterality Date  . BREAST SURGERY     cyst removed from right  . CESAREAN SECTION    . CHOLECYSTECTOMY  01/18/2017  . CHOLECYSTECTOMY N/A 01/18/2017   Procedure: LAPAROSCOPIC CHOLECYSTECTOMY;  Surgeon: Coralie Keens, MD;  Location: [place];  Service: General;  Laterality: N/A;  . HERNIA REPAIR    . INCISIONAL HERNIA REPAIR  04/13/2016  . INCISIONAL HERNIA REPAIR N/A 04/13/2016   Procedure: INCISIONAL HERNIA REPAIR;  Surgeon: Coralie Keens, MD;  Location: [facility];  Service: General;  Laterality: N/A;  . [place]  09/14/2018  . INCISIONAL HERNIA REPAIR N/A 09/14/2018   Procedure: INCISIONAL HERNIA REPAIR WITH MESH, POTENTIAL A-CELL XENOGRAFT MESH ERAS PATHWAY;  Surgeon: Coralie Keens, MD;   Location: [place];  Service: General;  Laterality: N/A;  . INSERTION OF MESH N/A 04/13/2016   Procedure: INSERTION OF MESH;  Surgeon: Coralie Keens, MD;  Location: [place];  Service: General;  Laterality: N/A;  . INSERTION OF MESH N/A 09/14/2018   Procedure: INSERTION OF MESH;  Surgeon: Coralie Keens, MD;  Location: [facility];  Service: General;  Laterality: N/A;  . IR RADIOLOGIST EVAL & MGMT  11/30/2018  . LAPAROTOMY N/A 03/05/2017   Procedure: EXPLORATORY LAPAROTOMY;  Surgeon: Rolm Bookbinder, MD;  Location: [place];  Service: General;  Laterality: N/A;  . LYSIS OF ADHESION N/A 03/05/2017   Procedure: LYSIS OF ADHESIONs;  Surgeon: Rolm Bookbinder, MD;  Location: [facility];  Service: General;  Laterality: N/A;  . PILONIDAL CYST / SINUS EXCISION    . removal of sweat gland Bilateral    arms    Family History  Problem Relation Age of Onset  . Hypertension Other   . Hyperlipidemia Other    Social History:  reports that she has never smoked. She has never used smokeless tobacco. She reports current alcohol use. She reports that she does not use drugs.  Allergies:  Allergies  Allergen Reactions  . Penicillins Anaphylaxis, Hives and Rash    PATIENT HAD A PCN REACTION WITH IMMEDIATE RASH, FACIAL/TONGUE/THROAT SWELLING, SOB, OR LIGHTHEADEDNESS WITH HYPOTENSION:   YES  Reaction causing SEVERE RASH INVOLVING MUCUS  MEMBRANES or SKIN NECROSIS: #  #  #  YES  #  #  #   PATIENT HAS HAD A PCN REACTION THAT REQUIRED HOSPITALIZATION:  #  #  #  YES  #  #  #  Has patient had a PCN reaction occurring within the last 10 years: No  . Pollen Extract Hives and Itching    All-over hives    (Not in a hospital admission)   Results for orders placed or performed during the hospital encounter of 12/02/18 (from the past 48 hour(s))  CBC with Differential     Status: Abnormal   Collection Time: 12/02/18  4:58 PM  Result Value Ref Range   WBC 4.5 4.0 - 10.5 K/uL   RBC 3.41 (L) 3.87 - 5.11 MIL/uL   Hemoglobin 11.0  (L) 12.0 - 15.0 g/dL   HCT 34.1 (L) 36.0 - 46.0 %   MCV 100.0 80.0 - 100.0 fL   MCH 32.3 26.0 - 34.0 pg   MCHC 32.3 30.0 - 36.0 g/dL   RDW 13.4 11.5 - 15.5 %   Platelets 269 150 - 400 K/uL   nRBC 0.0 0.0 - 0.2 %   Neutrophils Relative % 58 %   Neutro Abs 2.6 1.7 - 7.7 K/uL   Lymphocytes Relative 34 %   Lymphs Abs 1.5 0.7 - 4.0 K/uL   Monocytes Relative 8 %   Monocytes Absolute 0.4 0.1 - 1.0 K/uL   Eosinophils Relative 0 %   Eosinophils Absolute 0.0 0.0 - 0.5 K/uL   Basophils Relative 0 %   Basophils Absolute 0.0 0.0 - 0.1 K/uL   nRBC 0 0 /100 WBC   Abs Immature Granulocytes 0.00 0.00 - 0.07 K/uL    Comment: Performed at [facility], [address], [place], [place] [address]  Comprehensive metabolic panel     Status: Abnormal   Collection Time: 12/02/18  4:58 PM  Result Value Ref Range   Sodium 138 135 - 145 mmol/L   Potassium 4.1 3.5 - 5.1 mmol/L   Chloride 103 98 - 111 mmol/L   CO2 28 22 - 32 mmol/L   Glucose, Bld 89 70 - 99 mg/dL   BUN 9 6 - 20 mg/dL   Creatinine, Ser 1.01 (H) 0.44 - 1.00 mg/dL   Calcium 9.2 8.9 - 10.3 mg/dL   Total Protein 6.9 6.5 - 8.1 g/dL   Albumin 3.3 (L) 3.5 - 5.0 g/dL   AST 17 15 - 41 U/L   ALT 9 0 - 44 U/L   Alkaline Phosphatase 59 38 - 126 U/L   Total Bilirubin 0.5 0.3 - 1.2 mg/dL   GFR calc non Af Amer >60 >60 mL/min   GFR calc Af Amer >60 >60 mL/min   Anion gap 7 5 - 15    Comment: Performed at [facility] [address], [place], [place] [address]  Lipase, blood     Status: None   Collection Time: 12/02/18  4:58 PM  Result Value Ref Range   Lipase 16 11 - 51 U/L    Comment: Performed at [facility] [address] [place], [place] [address]   Ct Abdomen Pelvis W Contrast  Result Date: 12/02/2018 CLINICAL DATA:  Erythema and swelling along hernia repair site, postoperative by 2 weeks EXAM: CT ABDOMEN AND PELVIS WITH CONTRAST TECHNIQUE: Multidetector CT imaging of the abdomen and pelvis was performed using the  standard protocol following bolus administration of intravenous contrast. CONTRAST:  11mL OMNIPAQUE IOHEXOL 300 MG/ML  SOLN COMPARISON:  11/30/2018 FINDINGS: Lower chest: Mild cardiomegaly. Hepatobiliary: Cholecystectomy. The common bile duct measures about 9 mm in diameter, stable and likely a physiologic response to prior cholecystectomy. Liver parenchyma unremarkable. Pancreas: Unremarkable Spleen: Unremarkable Adrenals/Urinary Tract: Unremarkable Stomach/Bowel: Laxity along the anterior abdominal wall with a small right lower quadrant hernia containing adipose tissue and small bowel. Vascular/Lymphatic: Left inguinal lymph node 1.4 cm in short axis on image 80/3, formerly the same. Left external iliac node 1.0 cm in short axis on image 73/2, likewise stable. Right external iliac node 1.2 cm in short axis on image 73/2, stable. Reproductive: Unremarkable Other: A thick wall subcutaneous collection of gas and fluid measuring 8.6 by 6.0 by 7.4 cm (volume = 200 cm^3) is present, and when I measure in the same fashion this was previously 9.8 by 7.7 by 6.8 cm (volume = 270 cm^3), and has thus reduced in volume by about 25%. Surrounding inflammatory stranding extending inferiorly along the pannus, similar to prior. Musculoskeletal: Unremarkable IMPRESSION: 1. About 25% reduction in volume of the thick-walled anterior subcutaneous collection of gas and fluid favoring abscess. Surrounding inflammatory stranding especially extending inferiorly along the pannus compatible with mild panniculitis. 2. There is a small right lower quadrant hernia, below the vertical level of the abscess, containing adipose tissue and a small amount of small bowel. Lax anterior abdominal wall. 3. Mild pelvic adenopathy, likely reactive in this setting. Electronically Signed   By: Van Clines M.D.   On: 12/02/2018 19:37    Review of Systems  All other systems reviewed and are negative.   Blood pressure (!) 148/101, pulse 67,  temperature 98.2 F (36.8 C), resp. rate 18, height 5\' 1"  (1.549 m), weight 108.9 kg, last menstrual period 09/02/2018, SpO2 98 %. Physical Exam  Constitutional: She appears well-developed.  HENT:  Head: Normocephalic.  Eyes: Pupils are equal, round, and reactive to light.  Neck: Normal range of motion.  Cardiovascular: Normal rate and regular rhythm.  Respiratory: Effort normal and breath sounds normal.  GI: Soft. Bowel sounds are normal.    Musculoskeletal: Normal range of motion.  Neurological: She is alert.  Skin: Skin is warm and dry.  Psychiatric: She has a normal mood and affect.     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  Mesh  Will admit for observation.  May require drainage over the weekend.  Dr. [name] aware.  Turner Daniels, MD 12/02/2018, 8:18 PM

## 2018-12-02 NOTE — ED Notes (Signed)
Up to the br  Meal given

## 2018-12-02 NOTE — ED Triage Notes (Signed)
Pt has pain with redness swelling to her 2 week post op hernia repair surgical site. Pt reports "I have a fever but I don't have a thermometer." Denies cough or shortness of breath, denies known. exposure to covid 19 postitive pt.

## 2018-12-02 NOTE — ED Notes (Signed)
insuccessful attempt to call report

## 2018-12-02 NOTE — ED Notes (Signed)
Report called to rn on 1n

## 2018-12-02 NOTE — ED Notes (Signed)
Patient transported to CT 

## 2018-12-03 ENCOUNTER — Encounter (HOSPITAL_COMMUNITY): Payer: Self-pay

## 2018-12-03 LAB — SURGICAL PCR SCREEN
MRSA, PCR: NEGATIVE
Staphylococcus aureus: NEGATIVE

## 2018-12-03 MED ORDER — DIPHENHYDRAMINE HCL 50 MG/ML IJ SOLN
25.0000 mg | Freq: Four times a day (QID) | INTRAMUSCULAR | Status: DC | PRN
  Administered 2018-12-03 – 2018-12-05 (×5): 25 mg via INTRAVENOUS
  Filled 2018-12-03 (×5): qty 1

## 2018-12-03 MED ORDER — RIVAROXABAN 20 MG PO TABS
20.0000 mg | ORAL_TABLET | Freq: Every day | ORAL | Status: DC
  Administered 2018-12-03 – 2018-12-04 (×2): 20 mg via ORAL
  Filled 2018-12-03 (×2): qty 1

## 2018-12-03 NOTE — Plan of Care (Signed)
  Problem: Clinical Measurements: Goal: Ability to maintain clinical measurements within normal limits will improve Outcome: Not Progressing Goal: Diagnostic test results will improve Outcome: Not Progressing   

## 2018-12-04 ENCOUNTER — Inpatient Hospital Stay (HOSPITAL_COMMUNITY): Payer: Medicaid Other | Admitting: Certified Registered Nurse Anesthetist

## 2018-12-04 ENCOUNTER — Encounter (HOSPITAL_COMMUNITY)
Admission: EM | Disposition: A | Discharge status: Home / Self Care | Payer: Self-pay | Source: Home / Self Care | Attending: Surgery

## 2018-12-04 ENCOUNTER — Encounter (HOSPITAL_COMMUNITY): Payer: Self-pay | Admitting: Critical Care Medicine

## 2018-12-04 HISTORY — PX: INCISION AND DRAINAGE ABSCESS: SHX5864

## 2018-12-04 SURGERY — INCISION AND DRAINAGE, ABSCESS
Anesthesia: General | Site: Abdomen

## 2018-12-04 MED ORDER — MORPHINE SULFATE (PF) 2 MG/ML IV SOLN
1.0000 mg | INTRAVENOUS | Status: DC | PRN
  Administered 2018-12-04: 2 mg via INTRAVENOUS
  Filled 2018-12-04: qty 1

## 2018-12-04 MED ORDER — ACETAMINOPHEN 500 MG PO TABS
ORAL_TABLET | ORAL | Status: AC
  Filled 2018-12-04: qty 2

## 2018-12-04 MED ORDER — BUPIVACAINE HCL (PF) 0.25 % IJ SOLN
INTRAMUSCULAR | Status: AC
  Filled 2018-12-04: qty 30

## 2018-12-04 MED ORDER — FENTANYL CITRATE (PF) 100 MCG/2ML IJ SOLN: 25.0000 ug | INTRAMUSCULAR | Status: DC | PRN

## 2018-12-04 MED ORDER — LACTATED RINGERS IV SOLN
INTRAVENOUS | Status: DC | PRN
  Administered 2018-12-04: 08:00:00 via INTRAVENOUS

## 2018-12-04 MED ORDER — PROPOFOL 10 MG/ML IV BOLUS
INTRAVENOUS | Status: AC
  Filled 2018-12-04: qty 20

## 2018-12-04 MED ORDER — HYDROMORPHONE HCL 1 MG/ML IJ SOLN
1.0000 mg | INTRAMUSCULAR | Status: DC | PRN
  Administered 2018-12-04 – 2018-12-05 (×6): 1 mg via INTRAVENOUS
  Filled 2018-12-04 (×6): qty 1

## 2018-12-04 MED ORDER — FENTANYL CITRATE (PF) 250 MCG/5ML IJ SOLN
INTRAMUSCULAR | Status: AC
  Filled 2018-12-04: qty 5

## 2018-12-04 MED ORDER — MIDAZOLAM HCL 5 MG/5ML IJ SOLN
INTRAMUSCULAR | Status: DC | PRN
  Administered 2018-12-04: 2 mg via INTRAVENOUS

## 2018-12-04 MED ORDER — PROPOFOL 10 MG/ML IV BOLUS
INTRAVENOUS | Status: DC | PRN
  Administered 2018-12-04: 180 mg via INTRAVENOUS
  Administered 2018-12-04: 20 mg via INTRAVENOUS

## 2018-12-04 MED ORDER — 0.9 % SODIUM CHLORIDE (POUR BTL) OPTIME
TOPICAL | Status: DC | PRN
  Administered 2018-12-04: 1000 mL

## 2018-12-04 MED ORDER — PROMETHAZINE HCL 25 MG/ML IJ SOLN: 6.2500 mg | INTRAMUSCULAR | Status: DC | PRN

## 2018-12-04 MED ORDER — FENTANYL CITRATE (PF) 250 MCG/5ML IJ SOLN
INTRAMUSCULAR | Status: DC | PRN
  Administered 2018-12-04 (×2): 50 ug via INTRAVENOUS

## 2018-12-04 MED ORDER — ACETAMINOPHEN 500 MG PO TABS
1000.0000 mg | ORAL_TABLET | Freq: Once | ORAL | Status: AC
  Administered 2018-12-04: 1000 mg via ORAL
  Filled 2018-12-04: qty 2

## 2018-12-04 MED ORDER — LIDOCAINE 2% (20 MG/ML) 5 ML SYRINGE
INTRAMUSCULAR | Status: DC | PRN
  Administered 2018-12-04: 100 mg via INTRAVENOUS

## 2018-12-04 MED ORDER — DEXMEDETOMIDINE HCL IN NACL 200 MCG/50ML IV SOLN
INTRAVENOUS | Status: AC
  Filled 2018-12-04: qty 50

## 2018-12-04 MED ORDER — DIPHENHYDRAMINE HCL 50 MG/ML IJ SOLN
INTRAMUSCULAR | Status: DC | PRN
  Administered 2018-12-04: 12.5 mg via INTRAVENOUS

## 2018-12-04 MED ORDER — DEXMEDETOMIDINE HCL 200 MCG/2ML IV SOLN
INTRAVENOUS | Status: DC | PRN
  Administered 2018-12-04 (×2): 4 ug via INTRAVENOUS

## 2018-12-04 MED ORDER — SUCCINYLCHOLINE CHLORIDE 200 MG/10ML IV SOSY
PREFILLED_SYRINGE | INTRAVENOUS | Status: DC | PRN
  Administered 2018-12-04: 120 mg via INTRAVENOUS

## 2018-12-04 MED ORDER — BUPIVACAINE-EPINEPHRINE 0.25% -1:200000 IJ SOLN
INTRAMUSCULAR | Status: DC | PRN
  Administered 2018-12-04: 10 mL

## 2018-12-04 MED ORDER — ACETAMINOPHEN 325 MG PO TABS
ORAL_TABLET | ORAL | Status: DC | PRN
  Administered 2018-12-04: 1000 mg via ORAL

## 2018-12-04 MED ORDER — MIDAZOLAM HCL 2 MG/2ML IJ SOLN
INTRAMUSCULAR | Status: AC
  Filled 2018-12-04: qty 2

## 2018-12-04 MED ORDER — DEXAMETHASONE SODIUM PHOSPHATE 10 MG/ML IJ SOLN
INTRAMUSCULAR | Status: DC | PRN
  Administered 2018-12-04: 4 mg via INTRAVENOUS

## 2018-12-04 SURGICAL SUPPLY — 26 items
BNDG GAUZE ELAST 4 BULKY (GAUZE/BANDAGES/DRESSINGS) ×4 IMPLANT
CANISTER SUCT 3000ML PPV (MISCELLANEOUS) ×3 IMPLANT
COVER SURGICAL LIGHT HANDLE (MISCELLANEOUS) ×3 IMPLANT
COVER WAND RF STERILE (DRAPES) ×1 IMPLANT
DRAPE LAPAROSCOPIC ABDOMINAL (DRAPES) ×3 IMPLANT
ELECT REM PT RETURN 9FT ADLT (ELECTROSURGICAL) ×3
ELECTRODE REM PT RTRN 9FT ADLT (ELECTROSURGICAL) ×1 IMPLANT
GAUZE SPONGE 4X4 12PLY STRL (GAUZE/BANDAGES/DRESSINGS) ×2 IMPLANT
GLOVE SURG SIGNA 7.5 PF LTX (GLOVE) ×3 IMPLANT
GOWN STRL REUS W/ TWL LRG LVL3 (GOWN DISPOSABLE) ×1 IMPLANT
GOWN STRL REUS W/ TWL XL LVL3 (GOWN DISPOSABLE) ×1 IMPLANT
GOWN STRL REUS W/TWL LRG LVL3 (GOWN DISPOSABLE) ×3
GOWN STRL REUS W/TWL XL LVL3 (GOWN DISPOSABLE) ×3
KIT BASIN OR (CUSTOM PROCEDURE TRAY) ×3 IMPLANT
KIT TURNOVER KIT B (KITS) ×3 IMPLANT
NS IRRIG 1000ML POUR BTL (IV SOLUTION) ×3 IMPLANT
PACK GENERAL/GYN (CUSTOM PROCEDURE TRAY) ×3 IMPLANT
PAD ABD 7.5X8 STRL (GAUZE/BANDAGES/DRESSINGS) ×2 IMPLANT
PAD ARMBOARD 7.5X6 YLW CONV (MISCELLANEOUS) ×6 IMPLANT
PENCIL SMOKE EVACUATOR (MISCELLANEOUS) ×1 IMPLANT
SPECIMEN JAR SMALL (MISCELLANEOUS) IMPLANT
SWAB COLLECTION DEVICE MRSA (MISCELLANEOUS) IMPLANT
SWAB CULTURE ESWAB REG 1ML (MISCELLANEOUS) IMPLANT
TAPE CLOTH SURG 6X10 WHT LF (GAUZE/BANDAGES/DRESSINGS) ×2 IMPLANT
TOWEL OR 17X24 6PK STRL BLUE (TOWEL DISPOSABLE) ×3 IMPLANT
TOWEL OR 17X26 10 PK STRL BLUE (TOWEL DISPOSABLE) ×3 IMPLANT

## 2018-12-04 NOTE — Transfer of Care (Signed)
Immediate Anesthesia Transfer of Care Note  Patient: [name]  Procedure(s) Performed: OPEN/INCISION AND DRAINAGE SEROMA (N/A Abdomen)  Patient Location: PACU  Anesthesia Type:General  Level of Consciousness: awake and alert   Airway & Oxygen Therapy: Patient Spontanous Breathing and Patient connected to nasal cannula oxygen  Post-op Assessment: Report given to RN and Post -op Vital signs reviewed and stable  Post vital signs: Reviewed and stable  Last Vitals:  Vitals Value Taken Time  BP 131/88 12/04/2018  8:40 AM  Temp 36.7 C 12/04/2018  8:40 AM  Pulse 62 12/04/2018  8:43 AM  Resp 24 12/04/2018  8:43 AM  SpO2 97 % 12/04/2018  8:43 AM  Vitals shown include unvalidated device data.  Last Pain:  Vitals:   12/04/18 7078  TempSrc: Oral  PainSc:       Patients Stated Pain Goal: 3 (67/54/49 2010)  Complications: No apparent anesthesia complications

## 2018-12-04 NOTE — Op Note (Signed)
Procedure Note  [name] 12/02/2018 - 12/04/2018   Pre-op Diagnosis: CHRONIC ABD WALL SERMOA     Post-op Diagnosis: same  Procedure(s): INCISION AND DRAINAGE ABDOMINAL WALL SEROMA  Surgeon(s): Coralie Keens, MD  Anesthesia: General  Staff:  Circulator: Lenora Boys, RN Scrub Person: Dollene Cleveland T Circulator Assistant: Natalia Leatherwood, RN  Estimated Blood Loss: Minimal               Indications: This is a 38 year old female who is status post a ventral hernia repair with mesh back in January.  She had a drain placed postoperatively but she pulled this out at home secondary to her large pannus.  IR then placed a drain and a similar thing occurred.  Because of her discomfort from the seroma the decision was made to proceed with an open drainage.  Procedure: The patient was brought to the operating room and identified as the correct patient.  She was placed upon on the operating room table and general anesthesia was induced.  Her abdomen was then prepped and draped in usual sterile fashion.  I anesthetized the skin around the midline incision with Marcaine.  I then made an incision with a scalpel.  She had fairly thickened indurated skin above the seroma cavity.  I entered the seroma cavity and there was only clear fluid.  There was some free-floating pieces of the biologic mesh reaction which were moved.  I thoroughly irrigated the cavity with saline.  I then packed to the small cavity with a wet-to-dry saline soaked Kerlix.  Dry gauze and tape were then applied.  The patient tolerated the procedure well.  All the counts were correct at the end of the procedure.  The patient was then extubated in the operating room and taken in a stable condition to the recovery room.          Coralie Keens   Date: 12/04/2018  Time: 8:26 AM

## 2018-12-04 NOTE — Anesthesia Postprocedure Evaluation (Signed)
Anesthesia Post Note  Patient: [name]  Procedure(s) Performed: OPEN/INCISION AND DRAINAGE SEROMA (N/A Abdomen)     Patient location during evaluation: PACU Anesthesia Type: General Level of consciousness: awake and alert Pain management: pain level controlled Vital Signs Assessment: post-procedure vital signs reviewed and stable Respiratory status: spontaneous breathing, nonlabored ventilation and respiratory function stable Cardiovascular status: blood pressure returned to baseline and stable Postop Assessment: no apparent nausea or vomiting Anesthetic complications: no    Last Vitals:  Vitals:   12/04/18 0936 12/04/18 0955  BP:  122/83  Pulse: 66 (!) 58  Resp: 20 16  Temp: 36.6 C   SpO2: 94% 93%    Last Pain:  Vitals:   12/04/18 1052  TempSrc:   PainSc: 0-No pain                 Catalina Gravel

## 2018-12-04 NOTE — Anesthesia Preprocedure Evaluation (Signed)
Anesthesia Evaluation  Patient identified by MRN, date of birth, ID band Patient awake    Reviewed: Allergy & Precautions, NPO status , Patient's Chart, lab work & pertinent test results, reviewed documented beta blocker date and time   History of Anesthesia Complications (+) PROLONGED EMERGENCE, Emergence Delirium and history of anesthetic complications  Airway Mallampati: III  TM Distance: >3 FB Neck ROM: Full    Dental  (+) Teeth Intact, Dental Advisory Given   Pulmonary neg pulmonary ROS,  H/o PE   Pulmonary exam normal breath sounds clear to auscultation       Cardiovascular hypertension, Pt. on home beta blockers and Pt. on medications Normal cardiovascular exam Rhythm:Regular Rate:Normal     Neuro/Psych PSYCHIATRIC DISORDERS Depression negative neurological ROS     GI/Hepatic negative GI ROS, Neg liver ROS, S/p multiple abdominal surgeries +abdominal seroma   Endo/Other  Hypothyroidism Morbid obesity  Renal/GU negative Renal ROS     Musculoskeletal negative musculoskeletal ROS (+)   Abdominal   Peds  Hematology  (+) Blood dyscrasia (xarelto), anemia ,   Anesthesia Other Findings Day of surgery medications reviewed with the patient.  Reproductive/Obstetrics                             Anesthesia Physical Anesthesia Plan  ASA: III  Anesthesia Plan: General   Post-op Pain Management:    Induction: Intravenous  PONV Risk Score and Plan: 4 or greater and Midazolam, Dexamethasone, Ondansetron and Diphenhydramine  Airway Management Planned: Oral ETT  Additional Equipment:   Intra-op Plan:   Post-operative Plan: Extubation in OR  Informed Consent: I have reviewed the patients History and Physical, chart, labs and discussed the procedure including the risks, benefits and alternatives for the proposed anesthesia with the patient or authorized representative who has indicated  his/her understanding and acceptance.     Dental advisory given  Plan Discussed with: CRNA  Anesthesia Plan Comments:         Anesthesia Quick Evaluation

## 2018-12-04 NOTE — Anesthesia Procedure Notes (Signed)
Procedure Name: Intubation Date/Time: 12/04/2018 8:05 AM Performed by: Wilburn Cornelia, CRNA Pre-anesthesia Checklist: Patient identified, Emergency Drugs available, Suction available, Patient being monitored and Timeout performed Patient Re-evaluated:Patient Re-evaluated prior to induction Oxygen Delivery Method: Circle system utilized Preoxygenation: Pre-oxygenation with 100% oxygen Induction Type: IV induction and Rapid sequence Ventilation: Mask ventilation without difficulty Laryngoscope Size: Mac and 3 Grade View: Grade I Tube type: Oral Tube size: 7.0 mm Number of attempts: 1 Airway Equipment and Method: Stylet Placement Confirmation: ETT inserted through vocal cords under direct vision,  positive ETCO2,  CO2 detector and breath sounds checked- equal and bilateral Secured at: 21 cm Tube secured with: Tape Dental Injury: Teeth and Oropharynx as per pre-operative assessment

## 2018-12-04 NOTE — Progress Notes (Signed)
Patient ID: [name], female   DOB: 03-27-1981, 38 y.o.   MRN: [id number]   Pre Procedure note for inpatients:   [name] has been scheduled for Procedure(s): OPEN/INCISION AND DRAINAGE SEROMA (N/A) today. The various methods of treatment have been discussed with the patient. After consideration of the risks, benefits and treatment options the patient has consented to the planned procedure.   The patient has been seen and labs reviewed. There are no changes in the patient's condition to prevent proceeding with the planned procedure today.  Recent labs:  Lab Results  Component Value Date   WBC 4.5 12/02/2018   HGB 11.0 (L) 12/02/2018   HCT 34.1 (L) 12/02/2018   PLT 269 12/02/2018   GLUCOSE 89 12/02/2018   ALT 9 12/02/2018   AST 17 12/02/2018   NA 138 12/02/2018   K 4.1 12/02/2018   CL 103 12/02/2018   CREATININE 1.01 (H) 12/02/2018   BUN 9 12/02/2018   CO2 28 12/02/2018   TSH 11.412 (H) 02/27/2017   INR 1.2 11/08/2018    Coralie Keens, MD 12/04/2018 6:53 AM

## 2018-12-05 ENCOUNTER — Encounter (HOSPITAL_COMMUNITY): Payer: Self-pay | Admitting: Surgery

## 2018-12-05 MED ORDER — TRAMADOL HCL 50 MG PO TABS: 50.0000 mg | ORAL_TABLET | Freq: Four times a day (QID) | ORAL | 0 refills | Status: DC | PRN

## 2018-12-05 NOTE — Discharge Summary (Signed)
Physician Discharge Summary  Patient ID: [name] MRN: [id number] DOB/AGE: 10/05/1980 38 y.o.  Admit date: 12/02/2018 Discharge date: 12/05/2018  Admission Diagnoses:  Discharge Diagnoses:  Active Problems:   Abdominal wall seroma   Discharged Condition: good  Hospital Course: admitted with symptomatic abdominal wall seroma.  Had drains both post op and by IR that pulled out at home given her large pannus.  Admitted to have seroma drained. Tolerated surgery well.  No evidence of ongoing infection.  Discharged home doing well on POD#1  Consults: None  Significant Diagnostic Studies:   Treatments: surgery: incision and drainage of abdominal wall seroma  Discharge Exam: Blood pressure 135/89, pulse 63, temperature 98.2 F (36.8 C), temperature source Oral, resp. rate 18, height 5\' 1"  (1.549 m), weight 108.9 kg, last menstrual period 09/02/2018, SpO2 99 %. General appearance: alert, cooperative and no distress Incision/Wound:abdomen soft, non-tender, no cellulitis, wound clean  Disposition: Discharge disposition: 01-Home or Self Care        Allergies as of 12/05/2018      Reactions   Penicillins Anaphylaxis, Hives, Rash   PATIENT HAD A PCN REACTION WITH IMMEDIATE RASH, FACIAL/TONGUE/THROAT SWELLING, SOB, OR LIGHTHEADEDNESS WITH HYPOTENSION:   YES  Reaction causing SEVERE RASH INVOLVING MUCUS MEMBRANES or SKIN NECROSIS: #  #  #  YES  #  #  #   PATIENT HAS HAD A PCN REACTION THAT REQUIRED HOSPITALIZATION:  #  #  #  YES  #  #  #  Has patient had a PCN reaction occurring within the last 10 years: No   Pollen Extract Hives, Itching   All-over hives      Medication List    TAKE these medications   acetaminophen 500 MG tablet Commonly known as:  TYLENOL Take 500 mg by mouth every 6 (six) hours as needed for mild pain.   ARIPiprazole 5 MG tablet Commonly known as:  ABILIFY Take 5 mg by mouth daily.   DULoxetine 30 MG capsule Commonly known as:  CYMBALTA Take 30  mg by mouth 2 (two) times daily.   levothyroxine 112 MCG tablet Commonly known as:  SYNTHROID, LEVOTHROID Take 112 mcg by mouth daily before breakfast.   lisinopril 5 MG tablet Commonly known as:  PRINIVIL,ZESTRIL Take 5 mg by mouth daily.   loratadine 10 MG tablet Commonly known as:  CLARITIN Take 10 mg by mouth daily as needed for allergies.   metoprolol tartrate 50 MG tablet Commonly known as:  LOPRESSOR Take 50 mg by mouth 2 (two) times daily.   traMADol 50 MG tablet Commonly known as:  ULTRAM Take 1 tablet (50 mg total) by mouth every 6 (six) hours as needed for moderate pain or severe pain.   Xarelto 20 MG Tabs tablet Generic drug:  rivaroxaban Take 20 mg by mouth every evening.   zolpidem 10 MG tablet Commonly known as:  AMBIEN Take 10 mg by mouth at bedtime as needed for sleep.      Follow-up Information    Coralie Keens, MD. Schedule an appointment as soon as possible for a visit on 12/20/2018.   Specialty:  General Surgery Contact information: [address] [place] [address] [phone]           Signed: Coralie Keens 12/05/2018, 7:39 AM

## 2018-12-05 NOTE — Progress Notes (Signed)
Patient ID: [name], female   DOB: February 09, 1981, 38 y.o.   MRN: [id number]   Doing well No complaints Tolerating po  Dressing changed by me at bedside. Tolerated well.  Wound clean  Plan: Discharge home today

## 2018-12-05 NOTE — Discharge Instructions (Signed)
Wet to dry saline dressing changes with guaze twice daily

## 2018-12-05 NOTE — Progress Notes (Signed)
Discharged pt to home, alert, oriented, and stable. Instructions given and explained. Concerns were addressed accordingly.

## 2019-02-05 ENCOUNTER — Encounter (HOSPITAL_COMMUNITY): Payer: Self-pay | Admitting: Emergency Medicine

## 2019-02-05 ENCOUNTER — Other Ambulatory Visit: Payer: Self-pay

## 2019-02-05 ENCOUNTER — Emergency Department (HOSPITAL_COMMUNITY)
Admission: EM | Admit: 2019-02-05 | Discharge: 2019-02-05 | Disposition: A | Discharge status: Home / Self Care | Payer: Medicaid Other | Attending: Emergency Medicine | Admitting: Emergency Medicine

## 2019-02-05 DIAGNOSIS — E039 Hypothyroidism, unspecified: Secondary | ICD-10-CM | POA: Insufficient documentation

## 2019-02-05 DIAGNOSIS — Z7901 Long term (current) use of anticoagulants: Secondary | ICD-10-CM | POA: Diagnosis not present

## 2019-02-05 DIAGNOSIS — Z79899 Other long term (current) drug therapy: Secondary | ICD-10-CM | POA: Insufficient documentation

## 2019-02-05 DIAGNOSIS — K0889 Other specified disorders of teeth and supporting structures: Secondary | ICD-10-CM

## 2019-02-05 DIAGNOSIS — I10 Essential (primary) hypertension: Secondary | ICD-10-CM | POA: Insufficient documentation

## 2019-02-05 DIAGNOSIS — K029 Dental caries, unspecified: Secondary | ICD-10-CM | POA: Diagnosis not present

## 2019-02-05 MED ORDER — CLINDAMYCIN HCL 300 MG PO CAPS: 300.0000 mg | ORAL_CAPSULE | Freq: Four times a day (QID) | ORAL | 0 refills | Status: DC

## 2019-02-05 MED ORDER — MELOXICAM 7.5 MG PO TABS
15.0000 mg | ORAL_TABLET | ORAL | Status: AC
  Administered 2019-02-05: 15 mg via ORAL
  Filled 2019-02-05: qty 2

## 2019-02-05 MED ORDER — LIDOCAINE VISCOUS HCL 2 % MT SOLN
15.0000 mL | Freq: Once | OROMUCOSAL | Status: AC
  Administered 2019-02-05: 05:00:00 15 mL via OROMUCOSAL
  Filled 2019-02-05: qty 15

## 2019-02-05 MED ORDER — CLINDAMYCIN HCL 150 MG PO CAPS
300.0000 mg | ORAL_CAPSULE | Freq: Once | ORAL | Status: AC
  Administered 2019-02-05: 300 mg via ORAL
  Filled 2019-02-05: qty 2

## 2019-02-05 MED ORDER — CHLORHEXIDINE GLUCONATE 0.12 % MT SOLN: 15.0000 mL | Freq: Two times a day (BID) | OROMUCOSAL | 0 refills | Status: DC

## 2019-02-05 MED ORDER — MELOXICAM 15 MG PO TABS: 15.0000 mg | ORAL_TABLET | Freq: Every day | ORAL | 0 refills | Status: DC

## 2019-02-05 NOTE — ED Triage Notes (Signed)
Pt reports she had 4 teeth pulled on Wednesday, she is out of her pain meds and OTC aren't helping.

## 2019-02-05 NOTE — ED Provider Notes (Signed)
[facility] EMERGENCY DEPARTMENT Provider Note   CSN: [id number] Arrival date & time: 02/05/19  0205    History   Chief Complaint Chief Complaint  Patient presents with  . Dental Pain    HPI [name] is a 38 y.o. female.     The history is provided by the patient.  Dental Pain  Location:  Upper and lower Upper teeth location:  2/RU 2nd molar and 3/RU 1st molar Lower teeth location:  31/RL 2nd molar and 30/RL 1st molar Quality:  Aching Severity:  Severe Timing:  Constant Progression:  Unchanged Chronicity:  New Context: not abscess   Previous work-up:  Dental exam and filled cavity Relieved by:  Nothing Associated symptoms: no facial swelling and no fever   Risk factors: no alcohol problem     Past Medical History:  Diagnosis Date  . Complication of anesthesia    "difficulty waking up and will fight when woken up"  . Depression   . Gallstones   . Hernia, ventral   . History of blood clots   . History of pulmonary embolus (PE)   . Hypertension   . Hyperthyroidism    01/15/17- in the past  . Shortness of breath dyspnea     Patient Active Problem List   Diagnosis Date Noted  . Abdominal wall seroma 11/08/2018  . Wound dehiscence 09/23/2018  . Pre-operative cardiovascular examination 08/09/2018  . Left upper quadrant pain 03/03/2017  . Generalized abdominal wall pain 03/03/2017  . Obesity, Class III, BMI 40-49.9 (morbid obesity) ([place]) 03/03/2017  . Small bowel obstruction due to adhesions ([place]) 02/24/2017  . Essential hypertension 02/24/2017  . History of pulmonary embolism 02/24/2017  . Hypothyroidism 02/24/2017  . S/P laparoscopic cholecystectomy 01/18/2017  . Post op infection 05/03/2016  . Incisional hernia 04/13/2016    Past Surgical History:  Procedure Laterality Date  . BREAST SURGERY     cyst removed from right  . CESAREAN SECTION    . CHOLECYSTECTOMY  01/18/2017  . CHOLECYSTECTOMY N/A 01/18/2017   Procedure:  LAPAROSCOPIC CHOLECYSTECTOMY;  Surgeon: Coralie Keens, MD;  Location: [facility];  Service: General;  Laterality: N/A;  . HERNIA REPAIR    . INCISION AND DRAINAGE ABSCESS N/A 12/04/2018   Procedure: OPEN/INCISION AND DRAINAGE SEROMA;  Surgeon: Coralie Keens, MD;  Location: [facility];  Service: General;  Laterality: N/A;  . INCISIONAL HERNIA REPAIR  04/13/2016  . INCISIONAL HERNIA REPAIR N/A 04/13/2016   Procedure: INCISIONAL HERNIA REPAIR;  Surgeon: Coralie Keens, MD;  Location: [facility];  Service: General;  Laterality: N/A;  . [place]  09/14/2018  . INCISIONAL HERNIA REPAIR N/A 09/14/2018   Procedure: INCISIONAL HERNIA REPAIR WITH MESH, POTENTIAL A-CELL XENOGRAFT MESH ERAS PATHWAY;  Surgeon: Coralie Keens, MD;  Location: [place] [facility];  Service: General;  Laterality: N/A;  . INSERTION OF MESH N/A 04/13/2016   Procedure: INSERTION OF MESH;  Surgeon: Coralie Keens, MD;  Location: [facility];  Service: General;  Laterality: N/A;  . INSERTION OF MESH N/A 09/14/2018   Procedure: INSERTION OF MESH;  Surgeon: Coralie Keens, MD;  Location: [facility];  Service: General;  Laterality: N/A;  . IR RADIOLOGIST EVAL & MGMT  11/30/2018  . LAPAROTOMY N/A 03/05/2017   Procedure: EXPLORATORY LAPAROTOMY;  Surgeon: Rolm Bookbinder, MD;  Location: [facility];  Service: General;  Laterality: N/A;  . LYSIS OF ADHESION N/A 03/05/2017   Procedure: LYSIS OF ADHESIONs;  Surgeon: Rolm Bookbinder, MD;  Location: [facility];  Service: General;  Laterality: N/A;  . PILONIDAL CYST / SINUS EXCISION    . removal of sweat gland Bilateral    arms     OB History   No obstetric history on file.      Home Medications    Prior to Admission medications   Medication Sig Start Date End Date Taking? Authorizing Provider  acetaminophen (TYLENOL) 500 MG tablet Take 500 mg by mouth every 6 (six) hours as needed for mild pain.    [provider]  ARIPiprazole (ABILIFY) 5 MG tablet Take 5 mg by mouth daily.    [provider]  DULoxetine (CYMBALTA) 30 MG capsule Take 30 mg by mouth 2 (two) times daily. 06/28/18   [provider]  levothyroxine (SYNTHROID, LEVOTHROID) 112 MCG tablet Take 112 mcg by mouth daily before breakfast.  07/15/17   [provider]  lisinopril (PRINIVIL,ZESTRIL) 5 MG tablet Take 5 mg by mouth daily. 11/11/15   [provider]  loratadine (CLARITIN) 10 MG tablet Take 10 mg by mouth daily as needed for allergies.    [provider]  metoprolol (LOPRESSOR) 50 MG tablet Take 50 mg by mouth 2 (two) times daily. 09/12/15   [provider]  Rivaroxaban (XARELTO) 20 MG TABS Take 20 mg by mouth every evening.     [provider]  traMADol (ULTRAM) 50 MG tablet Take 1 tablet (50 mg total) by mouth every 6 (six) hours as needed for moderate pain or severe pain. 12/05/18   Coralie Keens, MD  zolpidem (AMBIEN) 10 MG tablet Take 10 mg by mouth at bedtime as needed for sleep.  06/28/18   [provider]    Family History Family History  Problem Relation Age of Onset  . Hypertension Other   . Hyperlipidemia Other     Social History Social History   Tobacco Use  . Smoking status: Never Smoker  . Smokeless tobacco: Never Used  Substance Use Topics  . Alcohol use: Yes    Comment: occasionally  . Drug use: No     Allergies   Penicillins and Pollen extract   Review of Systems Review of Systems  Constitutional: Negative for fever.  HENT: Positive for dental problem. Negative for facial swelling.   Respiratory: Negative for cough and shortness of breath.   All other systems reviewed and are negative.    Physical Exam Updated Vital Signs BP (!) 167/108 (BP Location: Right Arm)   Pulse 92   Temp 98.2 F (36.8 C) (Oral)   Resp 20   LMP 01/05/2019   SpO2 100%   Physical Exam   ED Treatments / Results  Labs (all labs ordered are listed, but only abnormal results are displayed) Labs Reviewed - No data to  display  EKG None  Radiology No results found.  Procedures Procedures (including critical care time)  Medications Ordered in ED Medications  clindamycin (CLEOCIN) capsule 300 mg (has no administration in time range)  meloxicam (MOBIC) tablet 15 mg (has no administration in time range)  lidocaine (XYLOCAINE) 2 % viscous mouth solution 15 mL (has no administration in time range)    Patient's overdose score is 450, she is getting narcotics from multiple sources.  She will be getting an RX for clindamycin and mobic.  I have politely explained to the patient that we do not refill narcotics.    Final Clinical Impressions(s) / ED Diagnoses  Return for intractable cough, coughing up blood,fevers >100.4 unrelieved by medication, shortness of breath, intractable vomiting,  chest pain, shortness of breath, weakness,numbness, changes in speech, facial asymmetry,abdominal pain, passing out,Inability to tolerate liquids or food, cough, altered mental status or any concerns. No signs of systemic illness or infection. The patient is nontoxic-appearing on exam and vital signs are within normal limits.   I have reviewed the triage vital signs and the nursing notes. Pertinent labs &imaging results that were available during my care of the patient were reviewed by me and considered in my medical decision making (see chart for details).  After history, exam, and medical workup I feel the patient has been appropriately medically screened and is safe for discharge home. Pertinent diagnoses were discussed with the patient. Patient was given return precautions    Kaidence Sant, MD 02/05/19 [phone]

## 2019-09-02 ENCOUNTER — Emergency Department (HOSPITAL_COMMUNITY)
Admission: EM | Admit: 2019-09-02 | Discharge: 2019-09-02 | Disposition: A | Discharge status: Home / Self Care | Payer: Medicaid Other | Attending: Emergency Medicine | Admitting: Emergency Medicine

## 2019-09-02 ENCOUNTER — Encounter (HOSPITAL_COMMUNITY): Payer: Self-pay

## 2019-09-02 ENCOUNTER — Other Ambulatory Visit: Payer: Self-pay

## 2019-09-02 DIAGNOSIS — E039 Hypothyroidism, unspecified: Secondary | ICD-10-CM | POA: Diagnosis not present

## 2019-09-02 DIAGNOSIS — M79602 Pain in left arm: Secondary | ICD-10-CM

## 2019-09-02 DIAGNOSIS — M5410 Radiculopathy, site unspecified: Secondary | ICD-10-CM | POA: Insufficient documentation

## 2019-09-02 DIAGNOSIS — Z7901 Long term (current) use of anticoagulants: Secondary | ICD-10-CM | POA: Insufficient documentation

## 2019-09-02 DIAGNOSIS — Z79899 Other long term (current) drug therapy: Secondary | ICD-10-CM | POA: Insufficient documentation

## 2019-09-02 DIAGNOSIS — I10 Essential (primary) hypertension: Secondary | ICD-10-CM | POA: Diagnosis not present

## 2019-09-02 DIAGNOSIS — Z791 Long term (current) use of non-steroidal anti-inflammatories (NSAID): Secondary | ICD-10-CM | POA: Insufficient documentation

## 2019-09-02 MED ORDER — PREDNISONE 20 MG PO TABS
60.0000 mg | ORAL_TABLET | Freq: Once | ORAL | Status: AC
  Administered 2019-09-02: 60 mg via ORAL
  Filled 2019-09-02: qty 3

## 2019-09-02 MED ORDER — GABAPENTIN 300 MG PO CAPS: 300.0000 mg | ORAL_CAPSULE | Freq: Three times a day (TID) | ORAL | 0 refills | Status: DC

## 2019-09-02 MED ORDER — PREDNISONE 10 MG PO TABS: 40.0000 mg | ORAL_TABLET | Freq: Every day | ORAL | 0 refills | Status: AC

## 2019-09-02 MED ORDER — OXYCODONE-ACETAMINOPHEN 5-325 MG PO TABS
1.0000 | ORAL_TABLET | Freq: Once | ORAL | Status: AC
  Administered 2019-09-02: 1 via ORAL
  Filled 2019-09-02: qty 1

## 2019-09-02 NOTE — ED Provider Notes (Signed)
[facility] EMERGENCY DEPARTMENT Provider Note   CSN: [id number] Arrival date & time: 09/02/19  1621     History No chief complaint on file.   [name] is a 38 y.o. female.  Patient is a 38 year old female with past medical history of brachial plexus dysfunction presenting to the emergency department for left arm pain.  Patient reports that she was diagnosed several years ago with a brachial plastics dysfunction.  She had medical management and physical therapy and reports that it improves.  Reports that gradually over the past several weeks she has been having a recurrence of the same symptoms.  Reports pain, paresthesias, burning sensation down her left arm.  Worse with movement.  Denies new injury or trauma.  Denies any chest pain, shortness of breath, new extremity weakness, headaches.  Reports that she has not been able to follow-up with her primary care doctor due to Covid.  Has reportedly tried tizanidine as well as Robaxin from urgent care without any relief.        Past Medical History:  Diagnosis Date  . Complication of anesthesia    "difficulty waking up and will fight when woken up"  . Depression   . Gallstones   . Hernia, ventral   . History of blood clots   . History of pulmonary embolus (PE)   . Hypertension   . Hyperthyroidism    01/15/17- in the past  . Shortness of breath dyspnea     Patient Active Problem List   Diagnosis Date Noted  . Abdominal wall seroma 11/08/2018  . Wound dehiscence 09/23/2018  . Pre-operative cardiovascular examination 08/09/2018  . Left upper quadrant pain 03/03/2017  . Generalized abdominal wall pain 03/03/2017  . Obesity, Class III, BMI 40-49.9 (morbid obesity) ([place]) 03/03/2017  . Small bowel obstruction due to adhesions ([place]) 02/24/2017  . Essential hypertension 02/24/2017  . History of pulmonary embolism 02/24/2017  . Hypothyroidism 02/24/2017  . S/P laparoscopic cholecystectomy 01/18/2017  . Post  op infection 05/03/2016  . Incisional hernia 04/13/2016    Past Surgical History:  Procedure Laterality Date  . BREAST SURGERY     cyst removed from right  . CESAREAN SECTION    . CHOLECYSTECTOMY  01/18/2017  . CHOLECYSTECTOMY N/A 01/18/2017   Procedure: LAPAROSCOPIC CHOLECYSTECTOMY;  Surgeon: Coralie Keens, MD;  Location: [name];  Service: General;  Laterality: N/A;  . HERNIA REPAIR    . INCISION AND DRAINAGE ABSCESS N/A 12/04/2018   Procedure: OPEN/INCISION AND DRAINAGE SEROMA;  Surgeon: Coralie Keens, MD;  Location: [facility];  Service: General;  Laterality: N/A;  . INCISIONAL HERNIA REPAIR  04/13/2016  . INCISIONAL HERNIA REPAIR N/A 04/13/2016   Procedure: INCISIONAL HERNIA REPAIR;  Surgeon: Coralie Keens, MD;  Location: [facility];  Service: General;  Laterality: N/A;  . [place]  09/14/2018  . INCISIONAL HERNIA REPAIR N/A 09/14/2018   Procedure: INCISIONAL HERNIA REPAIR WITH MESH, POTENTIAL A-CELL XENOGRAFT MESH ERAS PATHWAY;  Surgeon: Coralie Keens, MD;  Location: [facility];  Service: General;  Laterality: N/A;  . INSERTION OF MESH N/A 04/13/2016   Procedure: INSERTION OF MESH;  Surgeon: Coralie Keens, MD;  Location: [facility];  Service: General;  Laterality: N/A;  . INSERTION OF MESH N/A 09/14/2018   Procedure: INSERTION OF MESH;  Surgeon: Coralie Keens, MD;  Location: [facility];  Service: General;  Laterality: N/A;  . IR RADIOLOGIST EVAL & MGMT  11/30/2018  . LAPAROTOMY N/A 03/05/2017   Procedure: EXPLORATORY LAPAROTOMY;  Surgeon:  Rolm Bookbinder, MD;  Location: [facility];  Service: General;  Laterality: N/A;  . LYSIS OF ADHESION N/A 03/05/2017   Procedure: LYSIS OF ADHESIONs;  Surgeon: Rolm Bookbinder, MD;  Location: [facility];  Service: General;  Laterality: N/A;  . PILONIDAL CYST / SINUS EXCISION    . removal of sweat gland Bilateral    arms     OB History   No obstetric history on file.     Family History  Problem Relation Age of Onset  . Hypertension Other     . Hyperlipidemia Other     Social History   Tobacco Use  . Smoking status: Never Smoker  . Smokeless tobacco: Never Used  Substance Use Topics  . Alcohol use: Yes    Comment: occasionally  . Drug use: No    Home Medications Prior to Admission medications   Medication Sig Start Date End Date Taking? Authorizing Provider  acetaminophen (TYLENOL) 500 MG tablet Take 500 mg by mouth every 6 (six) hours as needed for mild pain.    [provider]  ARIPiprazole (ABILIFY) 5 MG tablet Take 5 mg by mouth daily.    [provider]  chlorhexidine (PERIDEX) 0.12 % solution Use as directed 15 mLs in the mouth or throat 2 (two) times daily. 02/05/19   Palumbo, April, MD  clindamycin (CLEOCIN) 300 MG capsule Take 1 capsule (300 mg total) by mouth 4 (four) times daily. X 7 days 02/05/19   Palumbo, April, MD  DULoxetine (CYMBALTA) 30 MG capsule Take 30 mg by mouth 2 (two) times daily. 06/28/18   [provider]  gabapentin (NEURONTIN) 300 MG capsule Take 1 capsule (300 mg total) by mouth 3 (three) times daily for 14 days. 09/02/19 09/16/19  Alveria Apley, PA-C  levothyroxine (SYNTHROID, LEVOTHROID) 112 MCG tablet Take 112 mcg by mouth daily before breakfast.  07/15/17   [provider]  lisinopril (PRINIVIL,ZESTRIL) 5 MG tablet Take 5 mg by mouth daily. 11/11/15   [provider]  loratadine (CLARITIN) 10 MG tablet Take 10 mg by mouth daily as needed for allergies.    [provider]  meloxicam (MOBIC) 15 MG tablet Take 1 tablet (15 mg total) by mouth daily. 02/05/19   Palumbo, April, MD  metoprolol (LOPRESSOR) 50 MG tablet Take 50 mg by mouth 2 (two) times daily. 09/12/15   [provider]  predniSONE (DELTASONE) 10 MG tablet Take 4 tablets (40 mg total) by mouth daily for 5 days. 09/02/19 09/07/19  Alveria Apley, PA-C  Rivaroxaban (XARELTO) 20 MG TABS Take 20 mg by mouth every evening.     [provider]  traMADol (ULTRAM) 50 MG  tablet Take 1 tablet (50 mg total) by mouth every 6 (six) hours as needed for moderate pain or severe pain. 12/05/18   Coralie Keens, MD  zolpidem (AMBIEN) 10 MG tablet Take 10 mg by mouth at bedtime as needed for sleep.  06/28/18   [provider]    Allergies    Penicillins and Pollen extract  Review of Systems   Review of Systems  Constitutional: Negative for chills and fever.  Respiratory: Negative for cough and shortness of breath.   Musculoskeletal: Positive for arthralgias and neck pain. Negative for neck stiffness.  Skin: Negative for rash and wound.  Neurological: Positive for numbness. Negative for dizziness, tremors, weakness and light-headedness.    Physical Exam Updated Vital Signs BP (!) 188/122   Pulse 75   Temp 98.7 F (37.1 C) (  Oral)   Resp 16   SpO2 98%   Physical Exam Vitals and nursing note reviewed.  Constitutional:      Appearance: Normal appearance.  HENT:     Head: Normocephalic.  Eyes:     Conjunctiva/sclera: Conjunctivae normal.  Pulmonary:     Effort: Pulmonary effort is normal.  Musculoskeletal:     Comments: Patient has pain with abduction of the left shoulder as well as diffuse tenderness in the trapezius muscle.  She has subjective decreased sensation in the entire left arm not in any specific dermatome.  She has global 4 out of 5 weakness of the entire left arm, likely secondary due to pain.  Normal range of motion of the neck.  Normal pulses and capillary refill.  Skin:    General: Skin is warm and dry.     Capillary Refill: Capillary refill takes less than 2 seconds.  Neurological:     Mental Status: She is alert.     Cranial Nerves: No cranial nerve deficit.     Sensory: Sensory deficit present.     Motor: Weakness present.     Gait: Gait normal.  Psychiatric:        Mood and Affect: Mood normal.     ED Results / Procedures / Treatments   Labs (all labs ordered are listed, but only abnormal results are  displayed) Labs Reviewed - No data to display  EKG None  Radiology No results found.  Procedures Procedures (including critical care time)  Medications Ordered in ED Medications  oxyCODONE-acetaminophen (PERCOCET/ROXICET) 5-325 MG per tablet 1 tablet (has no administration in time range)  predniSONE (DELTASONE) tablet 60 mg (has no administration in time range)    ED Course  I have reviewed the triage vital signs and the nursing notes.  Pertinent labs & imaging results that were available during my care of the patient were reviewed by me and considered in my medical decision making (see chart for details).    MDM Rules/Calculators/A&P                      Based on review of vitals, medical screening exam, lab work and/or imaging, there does not appear to be an acute, emergent etiology for the patient's symptoms. Counseled pt on good return precautions and encouraged both PCP and ED follow-up as needed.  Prior to discharge, I also discussed incidental imaging findings with patient in detail and advised appropriate, recommended follow-up in detail.  Clinical Impression: 1. Left arm pain   2. Radicular pain     Disposition: Discharge  Prior to providing a prescription for a controlled substance, I independently reviewed the patient's recent prescription history on the [place]. The patient had no recent or regular prescriptions and was deemed appropriate for a brief, less than 3 day prescription of narcotic for acute analgesia.  This note was prepared with assistance of Systems analyst. Occasional wrong-word or sound-a-like substitutions may have occurred due to the inherent limitations of voice recognition software.  Final Clinical Impression(s) / ED Diagnoses Final diagnoses:  Left arm pain  Radicular pain    Rx / DC Orders ED Discharge Orders         Ordered    predniSONE (DELTASONE) 10 MG tablet  Daily      09/02/19 1854    gabapentin (NEURONTIN) 300 MG capsule  3 times daily     09/02/19 1854  Alveria Apley, PA-C 09/02/19 1855    Lucrezia Starch, MD 09/03/19 1434

## 2019-09-02 NOTE — ED Triage Notes (Signed)
Patient complains of brachial plexus issue for years and reports chronic pain with same. States increased pain and cant get in to see her MS\D

## 2019-09-02 NOTE — Discharge Instructions (Signed)
You have been prescribed a medication that specifically helps with inflammation and nerve pain.  You have also been referred to orthopedic specialist for further treatment and work-up.  Please start by taking the gabapentin once before bedtime.  You can increase this to every 8 hours if needed for pain.  The gabapentin may make you sleepy or drowsy so do not drive with this medication. Based on review of vitals, medical screening exam, lab work and/or imaging, there does not appear to be an acute, emergent etiology for the patient's symptoms. Counseled pt on good return precautions and encouraged both PCP and ED follow-up as needed.  Prior to discharge, I also discussed incidental imaging findings with patient in detail and advised appropriate, recommended follow-up in detail.  Clinical Impression: 1. Left arm pain   2. Radicular pain     Disposition: Discharge  Prior to providing a prescription for a controlled substance, I independently reviewed the patient's recent prescription history on the [place]. The patient had no recent or regular prescriptions and was deemed appropriate for a brief, less than 3 day prescription of narcotic for acute analgesia.  This note was prepared with assistance of Systems analyst. Occasional wrong-word or sound-a-like substitutions may have occurred due to the inherent limitations of voice recognition software.

## 2020-01-11 ENCOUNTER — Encounter: Payer: Medicaid Other | Attending: Nurse Practitioner | Admitting: Dietician

## 2020-01-22 IMAGING — RF DG UGI W/ HIGH DENSITY W/KUB
5 series · 14 of 16 positions shown · non-contrast
Comparison: Abdominal and pelvic CT scan March 23, 2018

CLINICAL DATA: Pre bariatric screening.

EXAM:
UPPER GI SERIES WITH KUB
TECHNIQUE: After obtaining a scout radiograph a routine upper GI series was
performed using the an and high density barium. Effervescent
crystals and a barium tablet were administered.
FLUOROSCOPY TIME:  Fluoroscopy Time:  1 minutes, 12 seconds
Radiation Exposure Index (if provided by the fluoroscopic device):
383 mGy
Number of Acquired Spot Images: 9

[Series 1: one shot · 0.14mm/px · 1 of 1 slices shown (1 of 3)]
[im 1/1]
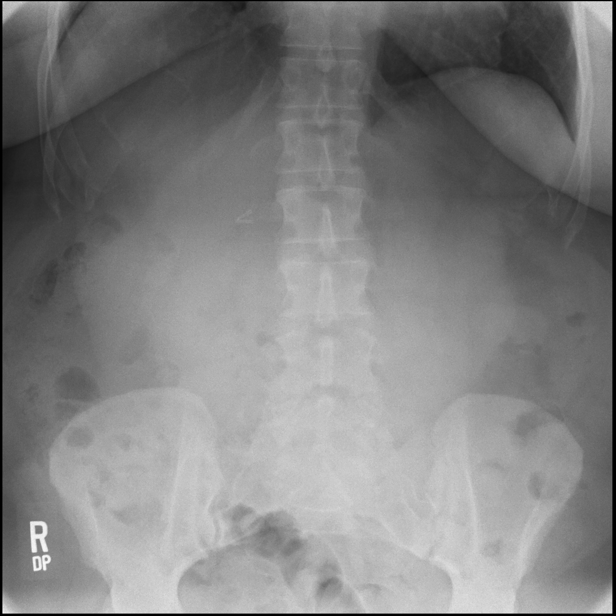

[Series 2: sequence · 3 of 77 frames shown (1 of 2)]
[frame 12/77]
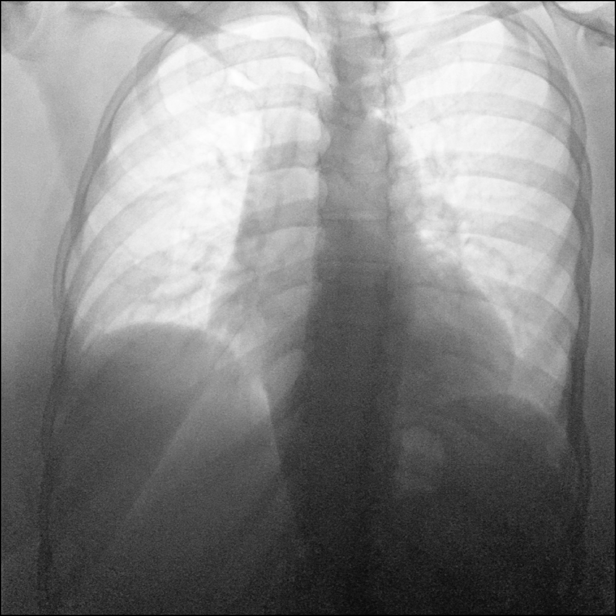
[frame 39/77]
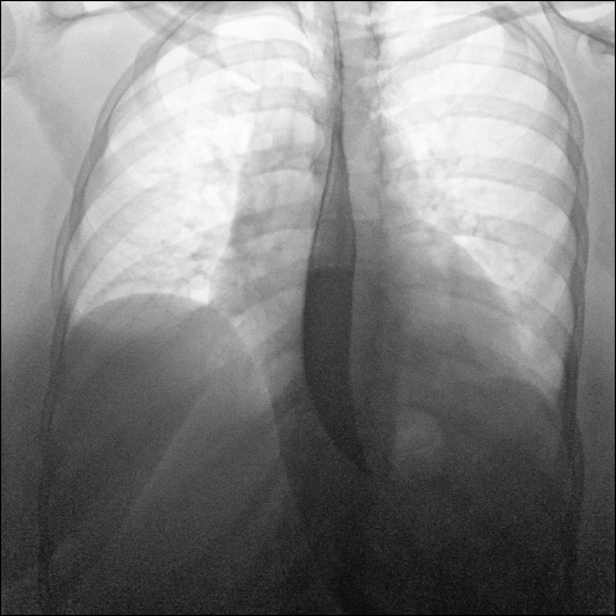
[frame 66/77]
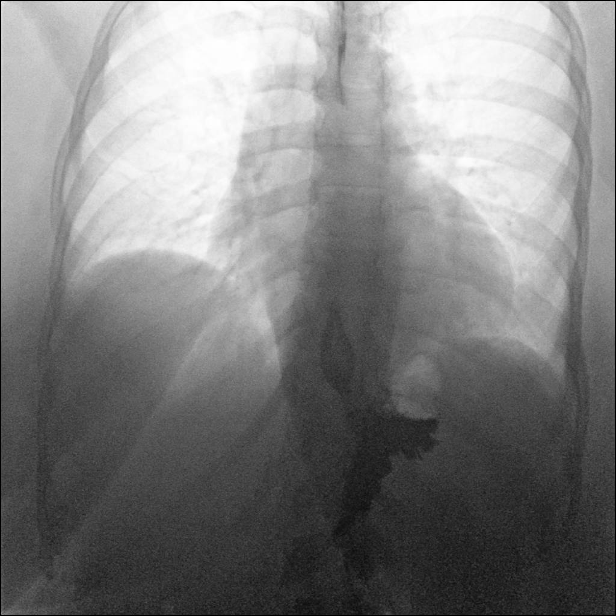

[Series 3: one shot · 6 of 6 slices shown (2 of 3)]
[im 1/6]
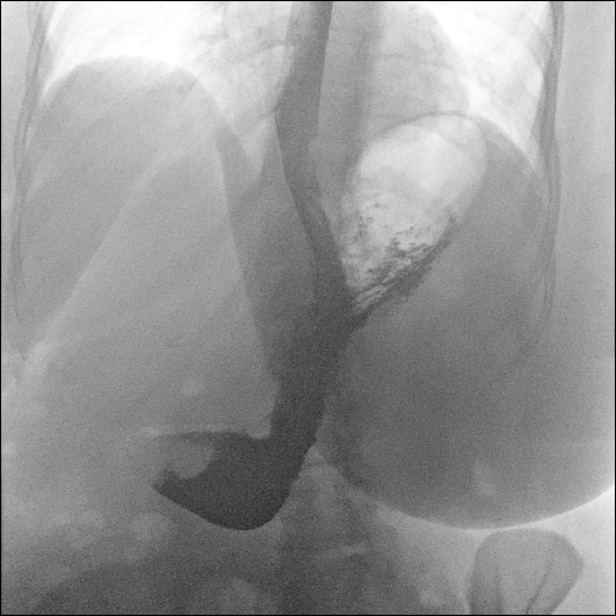
[im 2/6]
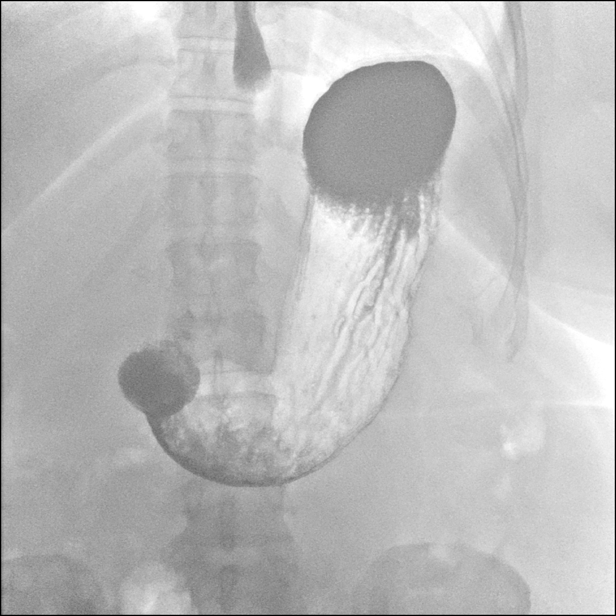
[im 3/6]
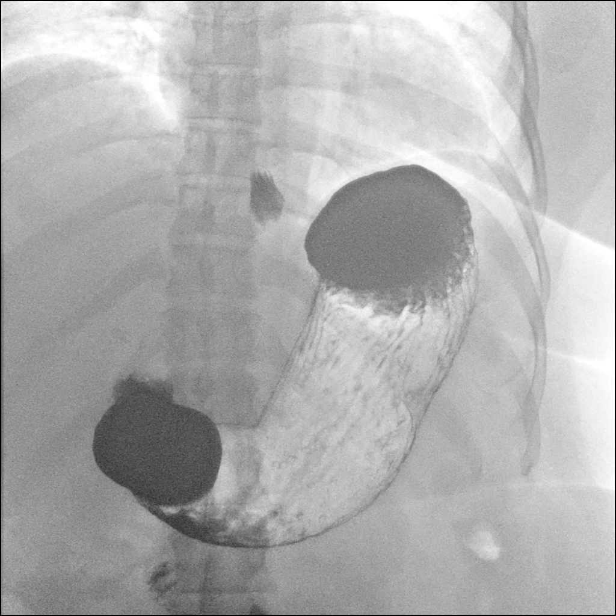
[im 4/6]
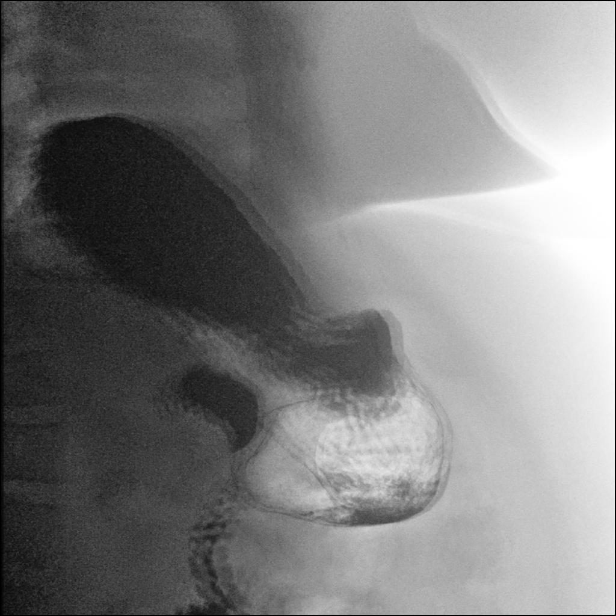
[im 5/6]
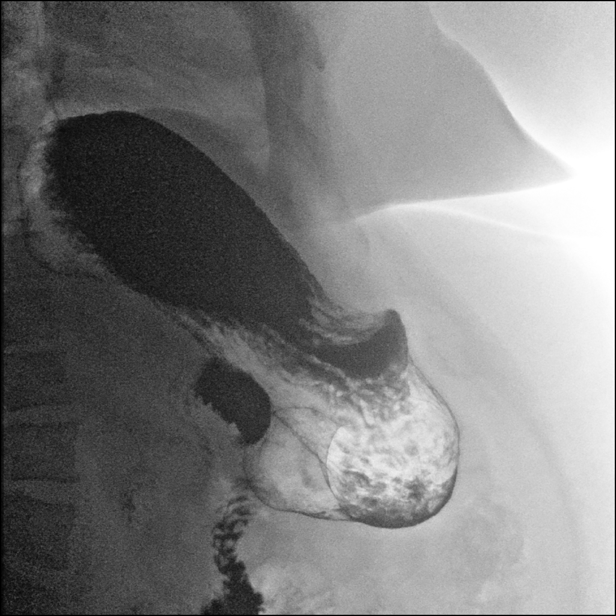
[im 6/6]
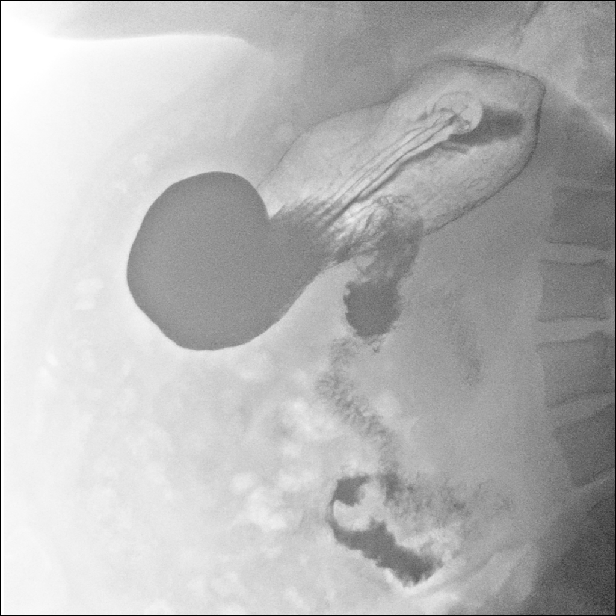

[Series 4: sequence · 3 of 39 frames shown (2 of 2)]
[frame 6/39]
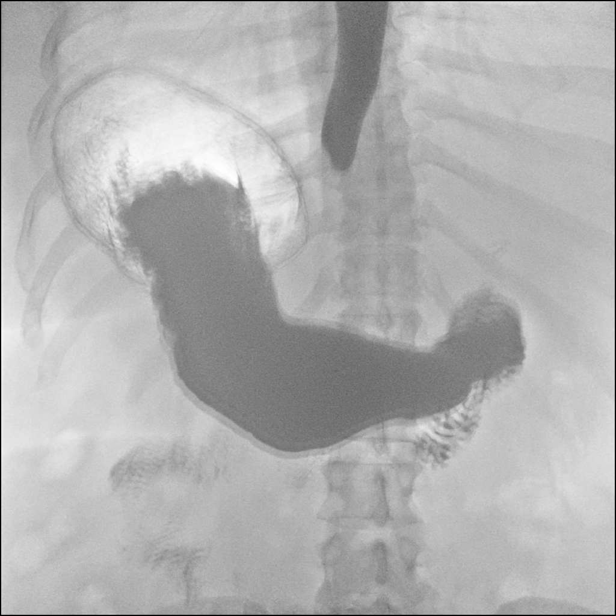
[frame 20/39]
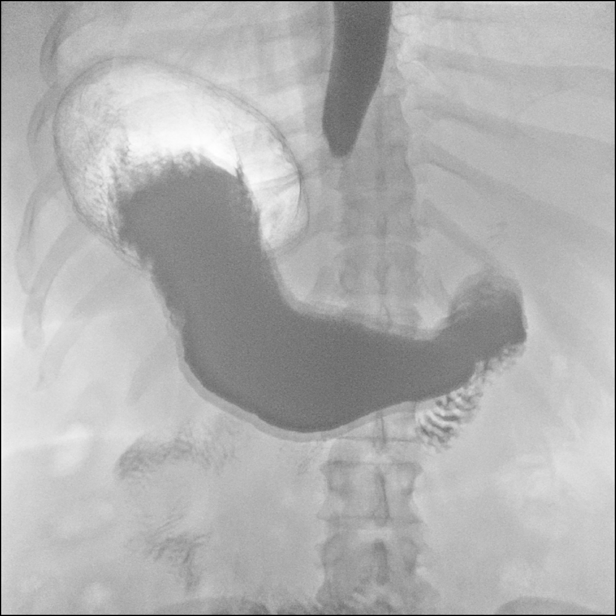
[frame 34/39]
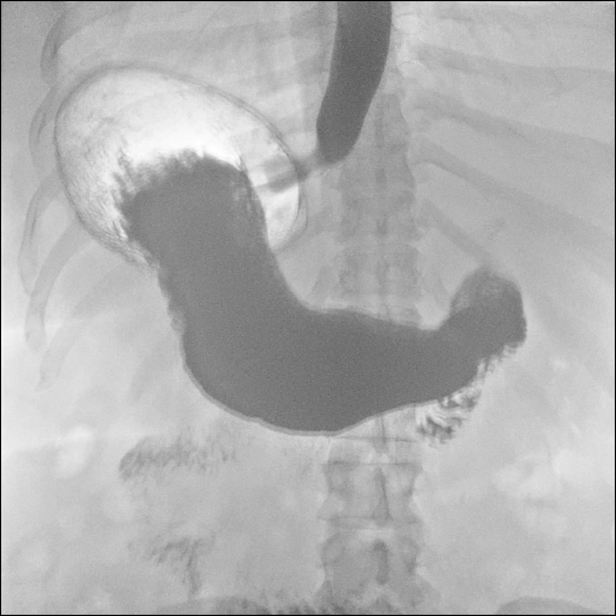

[Series 5: one shot · 1 of 1 slices shown (3 of 3)]
[im 1/1]
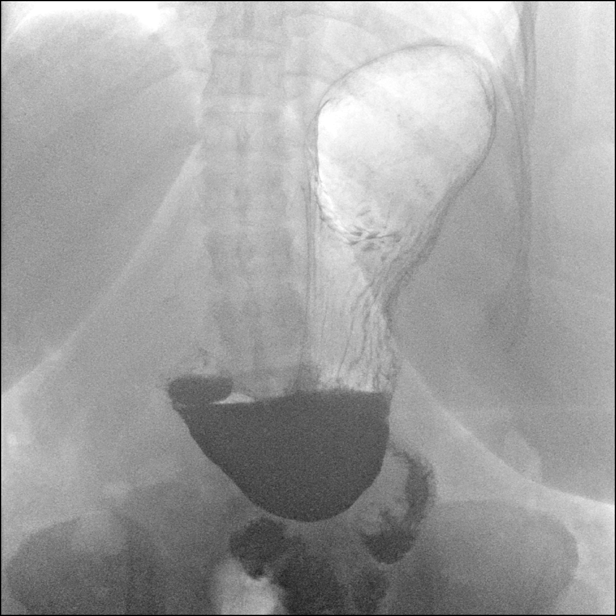

[14 of 16 positions shown; findings below may reference images not displayed]

FINDINGS: The scout radiograph reveals a normal bowel gas pattern. There
surgical clips in the gallbladder fossa. The patient ingested thick
and thin barium and the gas-forming crystals without difficulty. The
thoracic esophagus distended well and motility appeared normal.
There was no hiatal hernia nor gastroesophageal reflux. The stomach
distended well. The mucosal fold pattern was normal. Gastric
emptying was prompt. The duodenal bulb and C sweep were normal in
position and appearance. The barium tablet passed promptly from the
mouth to the stomach.
IMPRESSION: Normal upper GI series.

## 2020-04-15 ENCOUNTER — Ambulatory Visit: Payer: Medicaid Other | Admitting: Registered"

## 2020-08-31 ENCOUNTER — Other Ambulatory Visit: Payer: Self-pay

## 2020-08-31 ENCOUNTER — Emergency Department (HOSPITAL_COMMUNITY): Payer: Medicaid Other

## 2020-08-31 ENCOUNTER — Emergency Department (HOSPITAL_COMMUNITY)
Admission: EM | Admit: 2020-08-31 | Discharge: 2020-08-31 | Disposition: A | Discharge status: Home / Self Care | Payer: Medicaid Other | Attending: Emergency Medicine | Admitting: Emergency Medicine

## 2020-08-31 DIAGNOSIS — Z79899 Other long term (current) drug therapy: Secondary | ICD-10-CM | POA: Diagnosis not present

## 2020-08-31 DIAGNOSIS — Z7901 Long term (current) use of anticoagulants: Secondary | ICD-10-CM | POA: Diagnosis not present

## 2020-08-31 DIAGNOSIS — R0602 Shortness of breath: Secondary | ICD-10-CM | POA: Diagnosis not present

## 2020-08-31 DIAGNOSIS — E039 Hypothyroidism, unspecified: Secondary | ICD-10-CM | POA: Diagnosis not present

## 2020-08-31 DIAGNOSIS — R079 Chest pain, unspecified: Secondary | ICD-10-CM | POA: Diagnosis present

## 2020-08-31 DIAGNOSIS — I10 Essential (primary) hypertension: Secondary | ICD-10-CM

## 2020-08-31 DIAGNOSIS — R0789 Other chest pain: Secondary | ICD-10-CM | POA: Diagnosis not present

## 2020-08-31 DIAGNOSIS — Z20822 Contact with and (suspected) exposure to covid-19: Secondary | ICD-10-CM | POA: Insufficient documentation

## 2020-08-31 LAB — TROPONIN I (HIGH SENSITIVITY)
Troponin I (High Sensitivity): 3 ng/L (ref <18)
Troponin I (High Sensitivity): 3 ng/L (ref <18)

## 2020-08-31 LAB — COMPREHENSIVE METABOLIC PANEL
ALT: 24 U/L (ref 0–44)
AST: 22 U/L (ref 15–41)
Albumin: 4.1 g/dL (ref 3.5–5.0)
Alkaline Phosphatase: 81 U/L (ref 38–126)
Anion gap: 12 (ref 5–15)
BUN: 7 mg/dL (ref 6–20)
CO2: 23 mmol/L (ref 22–32)
Calcium: 9.3 mg/dL (ref 8.9–10.3)
Chloride: 102 mmol/L (ref 98–111)
Creatinine, Ser: 0.74 mg/dL (ref 0.44–1.00)
GFR, Estimated: >60 mL/min (ref >60)
Glucose, Bld: 144 mg/dL — ABNORMAL HIGH (ref 70–99)
Potassium: 3.9 mmol/L (ref 3.5–5.1)
Sodium: 137 mmol/L (ref 135–145)
Total Bilirubin: 0.8 mg/dL (ref 0.3–1.2)
Total Protein: 7.1 g/dL (ref 6.5–8.1)

## 2020-08-31 LAB — CBC WITH DIFFERENTIAL/PLATELET
Abs Immature Granulocytes: 0.07 10*3/uL (ref 0.00–0.07)
Basophils Absolute: 0.1 10*3/uL (ref 0.0–0.1)
Basophils Relative: 1 %
Eosinophils Absolute: 0.2 10*3/uL (ref 0.0–0.5)
Eosinophils Relative: 3 %
HCT: 35.8 % — ABNORMAL LOW (ref 36.0–46.0)
Hemoglobin: 12.4 g/dL (ref 12.0–15.0)
Immature Granulocytes: 1 %
Lymphocytes Relative: 33 %
Lymphs Abs: 2.6 10*3/uL (ref 0.7–4.0)
MCH: 31.4 pg (ref 26.0–34.0)
MCHC: 34.6 g/dL (ref 30.0–36.0)
MCV: 90.6 fL (ref 80.0–100.0)
Monocytes Absolute: 0.5 10*3/uL (ref 0.1–1.0)
Monocytes Relative: 6 %
Neutro Abs: 4.5 10*3/uL (ref 1.7–7.7)
Neutrophils Relative %: 56 %
Platelets: 383 10*3/uL (ref 150–400)
RBC: 3.95 MIL/uL (ref 3.87–5.11)
RDW: 12.4 % (ref 11.5–15.5)
WBC: 8 10*3/uL (ref 4.0–10.5)
nRBC: 0 % (ref 0.0–0.2)

## 2020-08-31 LAB — POC SARS CORONAVIRUS 2 AG -  ED: SARS Coronavirus 2 Ag: NEGATIVE

## 2020-08-31 LAB — PROTIME-INR
INR: 0.9 (ref 0.8–1.2)
Prothrombin Time: 12 seconds (ref 11.4–15.2)

## 2020-08-31 MED ORDER — IOHEXOL 350 MG/ML SOLN
75.0000 mL | Freq: Once | INTRAVENOUS | Status: AC | PRN
  Administered 2020-08-31: 10:00:00 75 mL via INTRAVENOUS

## 2020-08-31 MED ORDER — SODIUM CHLORIDE 0.9 % IV BOLUS
1000.0000 mL | Freq: Once | INTRAVENOUS | Status: AC
  Administered 2020-08-31: 09:00:00 1000 mL via INTRAVENOUS

## 2020-08-31 MED ORDER — METOCLOPRAMIDE HCL 5 MG/ML IJ SOLN
10.0000 mg | Freq: Once | INTRAMUSCULAR | Status: AC
  Administered 2020-08-31: 10 mg via INTRAVENOUS
  Filled 2020-08-31: qty 2

## 2020-08-31 MED ORDER — DIPHENHYDRAMINE HCL 50 MG/ML IJ SOLN
25.0000 mg | Freq: Once | INTRAMUSCULAR | Status: AC
  Administered 2020-08-31: 25 mg via INTRAVENOUS
  Filled 2020-08-31: qty 1

## 2020-08-31 NOTE — ED Triage Notes (Signed)
Pt from home by EMS; reports high BP with chest pressure; says was supposed to start new BP med, but has not picked it up yet and does not know the name of it.

## 2020-08-31 NOTE — Discharge Instructions (Addendum)
Your laboratory results are within normal limits today.  Please pick up your prescription for your new blood pressure medication and begin taking this immediately.  You may follow-up with your primary care physician as needed.

## 2020-08-31 NOTE — ED Notes (Signed)
Patient transported to X-ray 

## 2020-08-31 NOTE — ED Notes (Signed)
Patient transported to CT 
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
## 2020-09-26 ENCOUNTER — Other Ambulatory Visit: Payer: Self-pay | Admitting: Surgical Oncology

## 2021-04-15 ENCOUNTER — Other Ambulatory Visit: Payer: Self-pay | Admitting: Surgical Oncology

## 2021-04-17 ENCOUNTER — Other Ambulatory Visit: Payer: Self-pay

## 2021-04-22 ENCOUNTER — Ambulatory Visit
Admission: RE | Admit: 2021-04-22 | Discharge: 2021-04-22 | Disposition: A | Discharge status: Home / Self Care | Payer: Medicaid Other | Source: Ambulatory Visit | Attending: Surgical Oncology | Admitting: Surgical Oncology

## 2021-04-22 ENCOUNTER — Other Ambulatory Visit: Payer: Self-pay

## 2021-04-22 ENCOUNTER — Other Ambulatory Visit: Payer: Self-pay | Admitting: Surgical Oncology

## 2021-06-17 ENCOUNTER — Ambulatory Visit: Payer: Medicaid Other | Admitting: Obstetrics & Gynecology

## 2021-06-22 ENCOUNTER — Emergency Department (HOSPITAL_COMMUNITY)
Admission: EM | Admit: 2021-06-22 | Discharge: 2021-06-22 | Disposition: A | Discharge status: Home / Self Care | Payer: Medicaid Other | Attending: Emergency Medicine | Admitting: Emergency Medicine

## 2021-06-22 ENCOUNTER — Emergency Department (HOSPITAL_COMMUNITY): Payer: Medicaid Other

## 2021-06-22 ENCOUNTER — Encounter (HOSPITAL_COMMUNITY): Payer: Self-pay | Admitting: *Deleted

## 2021-06-22 DIAGNOSIS — Z5321 Procedure and treatment not carried out due to patient leaving prior to being seen by health care provider: Secondary | ICD-10-CM | POA: Diagnosis not present

## 2021-06-22 DIAGNOSIS — R079 Chest pain, unspecified: Secondary | ICD-10-CM | POA: Insufficient documentation

## 2021-06-22 DIAGNOSIS — F410 Panic disorder [episodic paroxysmal anxiety] without agoraphobia: Secondary | ICD-10-CM | POA: Insufficient documentation

## 2021-06-22 HISTORY — DX: Bipolar disorder, unspecified: F31.9

## 2021-06-22 LAB — CBC WITH DIFFERENTIAL/PLATELET
Abs Immature Granulocytes: 0.04 10*3/uL (ref 0.00–0.07)
Basophils Absolute: 0.1 10*3/uL (ref 0.0–0.1)
Basophils Relative: 1 %
Eosinophils Absolute: 0.2 10*3/uL (ref 0.0–0.5)
Eosinophils Relative: 2 %
HCT: 38 % (ref 36.0–46.0)
Hemoglobin: 12.1 g/dL (ref 12.0–15.0)
Immature Granulocytes: 0 %
Lymphocytes Relative: 38 %
Lymphs Abs: 3.5 10*3/uL (ref 0.7–4.0)
MCH: 30.6 pg (ref 26.0–34.0)
MCHC: 31.8 g/dL (ref 30.0–36.0)
MCV: 96 fL (ref 80.0–100.0)
Monocytes Absolute: 0.5 10*3/uL (ref 0.1–1.0)
Monocytes Relative: 6 %
Neutro Abs: 4.9 10*3/uL (ref 1.7–7.7)
Neutrophils Relative %: 53 %
Platelets: 440 10*3/uL — ABNORMAL HIGH (ref 150–400)
RBC: 3.96 MIL/uL (ref 3.87–5.11)
RDW: 13.2 % (ref 11.5–15.5)
WBC: 9.2 10*3/uL (ref 4.0–10.5)
nRBC: 0 % (ref 0.0–0.2)

## 2021-06-22 LAB — BASIC METABOLIC PANEL
Anion gap: 14 (ref 5–15)
BUN: 9 mg/dL (ref 6–20)
CO2: 19 mmol/L — ABNORMAL LOW (ref 22–32)
Calcium: 9.5 mg/dL (ref 8.9–10.3)
Chloride: 106 mmol/L (ref 98–111)
Creatinine, Ser: 1.09 mg/dL — ABNORMAL HIGH (ref 0.44–1.00)
GFR, Estimated: >60 mL/min (ref >60)
Glucose, Bld: 158 mg/dL — ABNORMAL HIGH (ref 70–99)
Potassium: 3.3 mmol/L — ABNORMAL LOW (ref 3.5–5.1)
Sodium: 139 mmol/L (ref 135–145)

## 2021-06-22 LAB — I-STAT BETA HCG BLOOD, ED (MC, WL, AP ONLY): I-stat hCG, quantitative: <5 m[IU]/mL (ref <5)

## 2021-06-22 LAB — TROPONIN I (HIGH SENSITIVITY): Troponin I (High Sensitivity): <2 ng/L (ref <18)

## 2021-06-22 NOTE — ED Notes (Signed)
PT left with daughter
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
## 2021-07-01 ENCOUNTER — Ambulatory Visit (HOSPITAL_COMMUNITY)
Admission: EM | Admit: 2021-07-01 | Discharge: 2021-07-01 | Disposition: A | Discharge status: Home / Self Care | Payer: Medicaid Other | Attending: Family Medicine | Admitting: Family Medicine

## 2021-07-01 ENCOUNTER — Ambulatory Visit (HOSPITAL_COMMUNITY): Payer: Medicaid Other | Admitting: Clinical

## 2021-07-01 DIAGNOSIS — Z760 Encounter for issue of repeat prescription: Secondary | ICD-10-CM | POA: Insufficient documentation

## 2021-07-01 DIAGNOSIS — Z733 Stress, not elsewhere classified: Secondary | ICD-10-CM | POA: Insufficient documentation

## 2021-07-01 DIAGNOSIS — F4322 Adjustment disorder with anxiety: Secondary | ICD-10-CM | POA: Insufficient documentation

## 2021-07-01 DIAGNOSIS — Z592 Discord with neighbors, lodgers and landlord: Secondary | ICD-10-CM | POA: Insufficient documentation

## 2021-07-01 DIAGNOSIS — Z79899 Other long term (current) drug therapy: Secondary | ICD-10-CM | POA: Insufficient documentation

## 2021-07-01 DIAGNOSIS — F319 Bipolar disorder, unspecified: Secondary | ICD-10-CM | POA: Insufficient documentation

## 2021-07-01 MED ORDER — BUSPIRONE HCL 10 MG PO TABS: 10.0000 mg | ORAL_TABLET | Freq: Two times a day (BID) | ORAL | 0 refills | Status: AC

## 2021-07-01 MED ORDER — HYDROXYZINE PAMOATE 50 MG PO CAPS: 50.0000 mg | ORAL_CAPSULE | Freq: Three times a day (TID) | ORAL | 0 refills | Status: AC | PRN

## 2021-07-01 NOTE — ED Notes (Signed)
Pt was given her d/c papers and resources phone number on housing

## 2021-07-01 NOTE — Discharge Instructions (Addendum)
Start Buspirone 10 mg twice daily for anxiety.-medication takes at least 2 weeks to begin to reduce anxiety and 4-6 for  maximal effectiveness.   Increased hydroxyzine 50 mg three times daily instead of twice daily -this should help improve sleep and help decrease anxiety.  Continue other medications as prescribed.     Behavioral health outpatient can manage and adjust medications once you establish care.   Therapy Walk-in Hours  Monday-Wednesday: 8 AM until slots are full  Friday: 1 PM to 5 PM  For Monday to Wednesday, it is recommended that patients arrive between 7:30 AM and 7:45 AM because patients will be seen in the order of arrival.  For Friday, we ask that patients arrive between 12 PM to 12:30 PM.  Go to the second floor on arrival and check in.  Availability is limited; therefore, patients may not be seen on the same day  Medication management walk-ins:  Monday to Friday: 8 AM to 11 AM.  It is recommended that patients arrive by 7:30 AM to 7:45 AM because patients will be seen in the order of arrival.  Go to the second floor on arrival and check in.  Availability is limited; therefore, patients may not be seen on the same day.

## 2021-07-01 NOTE — Progress Notes (Signed)
Patient presents to the [facility] seeking help for her anxiety.  Patient states that she is under a lot of stress and having panic attacks.  Patient has multiple psychosocial stressors: Patient is being evicted from rental home, has four dependent children and two grown children. 8 people living in a 2 bedroom house, conflict with neighbor and is stressed at work.  Patient is having panic attacks as the result of her stress.  She states that she is diagnosed with bipolar disorder.  States that she was seeing a therapist in the past, but states that she was seeing her virtually and she felt like she was distracted and she states that she was not getting much from the sessions.  She sees Dr. Eldridge Abrahams (PCP) who prescribes her medications.  Patient denies SI/HI/Psychosis, no SA issues. Patient is routine.

## 2021-07-02 ENCOUNTER — Telehealth (HOSPITAL_COMMUNITY): Payer: Self-pay | Admitting: Nurse Practitioner
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
## 2021-07-02 NOTE — BH Assessment (Signed)
Care Management - Follow Up [facility] Discharges   Writer attempted to make contact with patient today and was unsuccessful.  Writer left a HIPPA compliant voice message.   Per chart review, patient has a follow up appt with Gara Kroner, LCSW on 08-12-21.

## 2021-07-09 ENCOUNTER — Ambulatory Visit (HOSPITAL_COMMUNITY): Payer: Medicaid Other | Admitting: Student in an Organized Health Care Education/Training Program

## 2021-08-12 ENCOUNTER — Emergency Department (HOSPITAL_COMMUNITY)
Admission: EM | Admit: 2021-08-12 | Discharge: 2021-08-12 | Disposition: A | Discharge status: Home / Self Care | Payer: Medicaid Other | Attending: Emergency Medicine | Admitting: Emergency Medicine

## 2021-08-12 ENCOUNTER — Ambulatory Visit (INDEPENDENT_AMBULATORY_CARE_PROVIDER_SITE_OTHER): Payer: Medicaid Other | Admitting: Clinical

## 2021-08-12 DIAGNOSIS — F319 Bipolar disorder, unspecified: Secondary | ICD-10-CM | POA: Diagnosis not present

## 2021-08-12 DIAGNOSIS — I10 Essential (primary) hypertension: Secondary | ICD-10-CM | POA: Diagnosis not present

## 2021-08-12 DIAGNOSIS — Z7901 Long term (current) use of anticoagulants: Secondary | ICD-10-CM | POA: Insufficient documentation

## 2021-08-12 DIAGNOSIS — K0889 Other specified disorders of teeth and supporting structures: Secondary | ICD-10-CM | POA: Insufficient documentation

## 2021-08-12 DIAGNOSIS — Z79899 Other long term (current) drug therapy: Secondary | ICD-10-CM | POA: Diagnosis not present

## 2021-08-12 DIAGNOSIS — E039 Hypothyroidism, unspecified: Secondary | ICD-10-CM | POA: Diagnosis not present

## 2021-08-12 MED ORDER — HYDROCODONE-ACETAMINOPHEN 5-325 MG PO TABS
1.0000 | ORAL_TABLET | Freq: Four times a day (QID) | ORAL | 0 refills | Status: AC | PRN
Start: 2021-08-12 — End: ?

## 2021-08-12 NOTE — Discharge Instructions (Signed)
Please follow up with your oral surgeon tomorrow.   Take tylenol 1000mg  every 6 hours for pain. If you take one of the tablets of norco I prescribed then only take 500mg  of tylenol.  Drink plenty of water.

## 2021-08-12 NOTE — ED Provider Notes (Signed)
[facility] EMERGENCY DEPARTMENT Provider Note   CSN: [id number] Arrival date & time: 08/12/21  9935     History   Chief Complaint Chief Complaint  Patient presents with   Dental Pain    HPI [name] is a 40 y.o. female.  Patient presents to the emergency department with a dental complaint. Symptoms began . The patient has tried to alleviate pain with ibuprofen/tylenol.  Pain rated as severe, characterized as throbbing in nature and located right mouth. Patient denies fever, night sweats, chills, difficulty swallowing or opening mouth, SOB, nuchal rigidity or decreased ROM of neck.  Patient does not have a dentist and requests a resource guide at discharge.  Patient states that she had a tooth extracted approximately 1 week ago.  She states that she ran out of the pain medicine that she was prescribed and states that she has had severe pain.  She thinks that she may have dry socket she has had this in the past.  She is following up with her dentist tomorrow but came to the ER for pain.    HPI  Past Medical History:  Diagnosis Date   Bipolar 1 disorder ([place])    Complication of anesthesia    "difficulty waking up and will fight when woken up"   Depression    Gallstones    Hernia, ventral    History of blood clots    History of pulmonary embolus (PE)    Hypertension    Hyperthyroidism    01/15/17- in the past   Shortness of breath dyspnea     Patient Active Problem List   Diagnosis Date Noted   Abdominal wall seroma 11/08/2018   Wound dehiscence 09/23/2018   Pre-operative cardiovascular examination 08/09/2018   Left upper quadrant pain 03/03/2017   Generalized abdominal wall pain 03/03/2017   Obesity, Class III, BMI 40-49.9 (morbid obesity) ([place]) 03/03/2017   Small bowel obstruction due to adhesions ([place]) 02/24/2017   Essential hypertension 02/24/2017   History of pulmonary embolism 02/24/2017   Hypothyroidism 02/24/2017   S/P laparoscopic  cholecystectomy 01/18/2017   Post op infection 05/03/2016   Incisional hernia 04/13/2016    Past Surgical History:  Procedure Laterality Date   BREAST SURGERY     cyst removed from right   CESAREAN SECTION     CHOLECYSTECTOMY  01/18/2017   CHOLECYSTECTOMY N/A 01/18/2017   Procedure: LAPAROSCOPIC CHOLECYSTECTOMY;  Surgeon: Coralie Keens, MD;  Location: [facility];  Service: General;  Laterality: N/A;   HERNIA REPAIR     INCISION AND DRAINAGE ABSCESS N/A 12/04/2018   Procedure: OPEN/INCISION AND DRAINAGE SEROMA;  Surgeon: Coralie Keens, MD;  Location: [facility];  Service: General;  Laterality: N/A;   [place]  04/13/2016   INCISIONAL HERNIA REPAIR N/A 04/13/2016   Procedure: INCISIONAL HERNIA REPAIR;  Surgeon: Coralie Keens, MD;  Location: [facility];  Service: General;  Laterality: N/A;   [place]  09/14/2018   INCISIONAL HERNIA REPAIR N/A 09/14/2018   Procedure: [place], POTENTIAL A-CELL XENOGRAFT [place];  Surgeon: Coralie Keens, MD;  Location: [facility];  Service: General;  Laterality: N/A;   INSERTION OF MESH N/A 04/13/2016   Procedure: INSERTION OF MESH;  Surgeon: Coralie Keens, MD;  Location: [facility];  Service: General;  Laterality: N/A;   INSERTION OF MESH N/A 09/14/2018   Procedure: INSERTION OF MESH;  Surgeon: Coralie Keens, MD;  Location: [facility];  Service: General;  Laterality: N/A;  IR RADIOLOGIST EVAL & MGMT  11/30/2018   LAPAROTOMY N/A 03/05/2017   Procedure: EXPLORATORY LAPAROTOMY;  Surgeon: Rolm Bookbinder, MD;  Location: [facility];  Service: General;  Laterality: N/A;   LYSIS OF ADHESION N/A 03/05/2017   Procedure: LYSIS OF ADHESIONs;  Surgeon: Rolm Bookbinder, MD;  Location: [facility];  Service: General;  Laterality: N/A;   PILONIDAL CYST / SINUS EXCISION     removal of sweat gland Bilateral    arms     OB History   No obstetric history on file.      Home Medications    Prior to Admission medications    Medication Sig Start Date End Date Taking? Authorizing Provider  HYDROcodone-acetaminophen (NORCO/VICODIN) 5-325 MG tablet Take 1 tablet by mouth every 6 (six) hours as needed for severe pain. 08/12/21  Yes Adama Ferber S, PA  busPIRone (BUSPAR) 10 MG tablet Take 1 tablet (10 mg total) by mouth 2 (two) times daily. 07/01/21   Scot Jun, FNP  doxepin (SINEQUAN) 25 MG capsule Take 25 mg by mouth at bedtime. 08/06/20   [provider]  hydrOXYzine (VISTARIL) 50 MG capsule Take 1 capsule (50 mg total) by mouth 3 (three) times daily as needed for anxiety. 07/01/21   Scot Jun, FNP  lamoTRIgine (LAMICTAL) 150 MG tablet Take 150 mg by mouth at bedtime. 07/23/20   [provider]  levothyroxine (SYNTHROID, LEVOTHROID) 112 MCG tablet Take 112 mcg by mouth daily before breakfast.  07/15/17   [provider]  lisinopril-hydrochlorothiazide (ZESTORETIC) 10-12.5 MG tablet Take 1 tablet by mouth daily. 08/29/20   [provider]  metFORMIN (GLUCOPHAGE) 500 MG tablet Take 500 mg by mouth daily with breakfast. 07/15/20   [provider]  metoprolol (LOPRESSOR) 50 MG tablet Take 50 mg by mouth 2 (two) times daily. 09/12/15   [provider]  rivaroxaban (XARELTO) 20 MG TABS tablet Take 20 mg by mouth every evening.     [provider]  sertraline (ZOLOFT) 100 MG tablet Take 1 tablet by mouth at bedtime. 02/13/20   [provider]  traZODone (DESYREL) 50 MG tablet Take 1 tablet by mouth at bedtime. 03/31/20   [provider]  zolpidem (AMBIEN) 10 MG tablet Take 10 mg by mouth at bedtime as needed for sleep.  06/28/18   [provider]    Family History Family History  Problem Relation Age of Onset   Hypertension Other    Hyperlipidemia Other     Social History Social History   Tobacco Use   Smoking status: Never   Smokeless tobacco: Never  Vaping Use   Vaping Use: Never used  Substance Use Topics    Alcohol use: Yes    Comment: occasionally   Drug use: No     Allergies   Penicillins and Pollen extract   Review of Systems Denies fevers, chills, difficulty swallowing or eating, changes in voice, pain under tongue, nausea, vomiting, lightheadedness or dizziness. No trismus   Physical Exam Updated Vital Signs BP (!) 161/102 (BP Location: Left Arm)   Pulse 68   Temp 98.5 F (36.9 C) (Oral)   Resp 15   LMP 07/21/2021   SpO2 100%   Physical Exam Physical Exam  Constitutional: Pt appears well-developed and well-nourished.  HENT:  Head: Normocephalic.  Right Ear: Tympanic membrane, external ear and ear canal normal.  Left Ear: Tympanic membrane, external ear and ear canal normal.  Nose: Nose normal. Right sinus exhibits no maxillary sinus tenderness  and no frontal sinus tenderness. Left sinus exhibits no maxillary sinus tenderness and no frontal sinus tenderness.  Mouth/Throat: Uvula is midline, oropharynx is clear and moist and mucous membranes are normal. No oral lesions. No uvula swelling or lacerations. No oropharyngeal exudate, posterior oropharyngeal edema, posterior oropharyngeal erythema or tonsillar abscesses.  Poor dentition No gingival swelling, fluctuance or induration No gross abscess  No sublingual edema, tenderness to palpation, or sign of Ludwig's angina, or deep space infection Pain at tooth extraction site. No erythema or purulence. No palpable abscess. No discharge. Uvula midline.  Eyes: Conjunctivae are normal. Pupils are equal, round, and reactive to light. Right eye exhibits no discharge. Left eye exhibits no discharge.  Neck: Normal range of motion. Neck supple.  No stridor Handling secretions without difficulty No nuchal rigidity No cervical lymphadenopathy Cardiovascular: Normal rate, regular rhythm and normal heart sounds.   Pulmonary/Chest: Effort normal. No respiratory distress.  Equal chest rise  Abdominal: Soft. Bowel sounds are normal. Pt  exhibits no distension. There is no tenderness.  Lymphadenopathy: Pt has no cervical adenopathy.  Neurological: Pt is alert and oriented x 4  Skin: Skin is warm and dry.  Psychiatric: Pt has a normal mood and affect.  Nursing note and vitals reviewed.   ED Treatments / Results  Labs (all labs ordered are listed, but only abnormal results are displayed) Labs Reviewed - No data to display  EKG    Radiology No results found.  Procedures Procedures (including critical care time)  Medications Ordered in ED Medications - No data to display   Initial Impression / Assessment and Plan / ED Course  I have reviewed the triage vital signs and the nursing notes.  Pertinent labs & imaging results that were available during my care of the patient were reviewed by me and considered in my medical decision making (see chart for details).        Patient with dentalgia.  No abscess requiring immediate incision and drainage.  Exam not concerning for Ludwig's angina or pharyngeal abscess.  Will treat with several tablets of Norco. Pt instructed to follow-up with dentist.  Discussed return precautions. Pt safe for discharge.  Patient understands why this is such a short course.  There is no evidence of infection and she is following up with her dentist tomorrow.  No trismus or evidence of deep space infection.  Recommend Orajel Tylenol ibuprofen at home and Norco for breakthrough pain.  Final Clinical Impressions(s) / ED Diagnoses   Final diagnoses:  Pain, dental    ED Discharge Orders          Ordered    HYDROcodone-acetaminophen (NORCO/VICODIN) 5-325 MG tablet  Every 6 hours PRN        08/12/21 1018              Pati Gallo [place], [place] 08/15/21 0820    Wyvonnia Dusky, MD 08/15/21 [phone]

## 2021-08-12 NOTE — ED Triage Notes (Signed)
Pt. Stated, I had a tooth distracted last Wednesday and I need something for pain , the dentist could not see me til tomorrow, but I couldn't wait.
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
## 2021-10-05 ENCOUNTER — Encounter (HOSPITAL_COMMUNITY): Payer: Self-pay

## 2021-10-05 ENCOUNTER — Other Ambulatory Visit: Payer: Self-pay

## 2021-10-05 ENCOUNTER — Emergency Department (HOSPITAL_COMMUNITY)
Admission: EM | Admit: 2021-10-05 | Discharge: 2021-10-06 | Disposition: A | Discharge status: Home / Self Care | Payer: Medicaid Other | Attending: Emergency Medicine | Admitting: Emergency Medicine

## 2021-10-05 DIAGNOSIS — N760 Acute vaginitis: Secondary | ICD-10-CM | POA: Insufficient documentation

## 2021-10-05 DIAGNOSIS — K6289 Other specified diseases of anus and rectum: Secondary | ICD-10-CM | POA: Diagnosis not present

## 2021-10-05 DIAGNOSIS — Z7901 Long term (current) use of anticoagulants: Secondary | ICD-10-CM | POA: Diagnosis not present

## 2021-10-05 DIAGNOSIS — B9689 Other specified bacterial agents as the cause of diseases classified elsewhere: Secondary | ICD-10-CM | POA: Insufficient documentation

## 2021-10-05 DIAGNOSIS — R102 Pelvic and perineal pain: Secondary | ICD-10-CM

## 2021-10-05 DIAGNOSIS — Z7984 Long term (current) use of oral hypoglycemic drugs: Secondary | ICD-10-CM | POA: Diagnosis not present

## 2021-10-05 DIAGNOSIS — E119 Type 2 diabetes mellitus without complications: Secondary | ICD-10-CM | POA: Insufficient documentation

## 2021-10-05 LAB — URINALYSIS, ROUTINE W REFLEX MICROSCOPIC
Bilirubin Urine: NEGATIVE
Glucose, UA: NEGATIVE mg/dL
Hgb urine dipstick: NEGATIVE
Ketones, ur: NEGATIVE mg/dL
Leukocytes,Ua: NEGATIVE
Nitrite: NEGATIVE
Protein, ur: NEGATIVE mg/dL
Specific Gravity, Urine: 1.02 (ref 1.005–1.030)
pH: 7 (ref 5.0–8.0)

## 2021-10-05 LAB — POC URINE PREG, ED: Preg Test, Ur: NEGATIVE

## 2021-10-05 NOTE — ED Triage Notes (Signed)
Pt presents to the ED from home with complaints of vaginal and rectal pain. Pt states she had intercourse and has had problems since then. Denies bleeding.

## 2021-10-06 LAB — WET PREP, GENITAL
Sperm: NONE SEEN
Trich, Wet Prep: NONE SEEN
WBC, Wet Prep HPF POC: <10 (ref <10)
Yeast Wet Prep HPF POC: NONE SEEN

## 2021-10-06 MED ORDER — METRONIDAZOLE 500 MG PO TABS: 500.0000 mg | ORAL_TABLET | Freq: Two times a day (BID) | ORAL | 0 refills | Status: AC

## 2021-10-06 NOTE — ED Provider Notes (Signed)
[facility] EMERGENCY DEPARTMENT Provider Note   CSN: [id number] Arrival date & time: 10/05/21  2016     History  Chief Complaint  Patient presents with   Vaginal Pain    [name] is a 41 y.o. female with a history of herpes simplex and diabetes mellitus type 2 presenting today with vaginal and rectal tenderness over the last 4 days after intercourse.  Patient states she engaged in "rough sex" that involved her vagina and anus.  Tried yeast infection cream without relief.  Denies bleeding, fever, shortness of breath, dysuria, abdominal pain, or pruritus.  Endorses minor discomfort with bowel movements, but is still able to pass stool including one while she was waiting in the triage.  Denies blood in her stool.  Endorses urinary urgency but states this is somewhat normal.  No known history of abnormal Pap smears.  Recent was within the last 1 to 2 years.  History of bipolar disorder type 1.  The history is provided by the patient and medical records.      Home Medications Prior to Admission medications   Medication Sig Start Date End Date Taking? Authorizing Provider  metroNIDAZOLE (FLAGYL) 500 MG tablet Take 1 tablet (500 mg total) by mouth 2 (two) times daily. 10/06/21  Yes Prince Rome, PA-C  busPIRone (BUSPAR) 10 MG tablet Take 1 tablet (10 mg total) by mouth 2 (two) times daily. 07/01/21   Scot Jun, FNP  doxepin (SINEQUAN) 25 MG capsule Take 25 mg by mouth at bedtime. 08/06/20   [provider]  HYDROcodone-acetaminophen (NORCO/VICODIN) 5-325 MG tablet Take 1 tablet by mouth every 6 (six) hours as needed for severe pain. 08/12/21   Tedd Sias, PA  hydrOXYzine (VISTARIL) 50 MG capsule Take 1 capsule (50 mg total) by mouth 3 (three) times daily as needed for anxiety. 07/01/21   Scot Jun, FNP  lamoTRIgine (LAMICTAL) 150 MG tablet Take 150 mg by mouth at bedtime. 07/23/20   [provider]  levothyroxine  (SYNTHROID, LEVOTHROID) 112 MCG tablet Take 112 mcg by mouth daily before breakfast.  07/15/17   [provider]  lisinopril-hydrochlorothiazide (ZESTORETIC) 10-12.5 MG tablet Take 1 tablet by mouth daily. 08/29/20   [provider]  metFORMIN (GLUCOPHAGE) 500 MG tablet Take 500 mg by mouth daily with breakfast. 07/15/20   [provider]  metoprolol (LOPRESSOR) 50 MG tablet Take 50 mg by mouth 2 (two) times daily. 09/12/15   [provider]  rivaroxaban (XARELTO) 20 MG TABS tablet Take 20 mg by mouth every evening.     [provider]  sertraline (ZOLOFT) 100 MG tablet Take 1 tablet by mouth at bedtime. 02/13/20   [provider]  traZODone (DESYREL) 50 MG tablet Take 1 tablet by mouth at bedtime. 03/31/20   [provider]  zolpidem (AMBIEN) 10 MG tablet Take 10 mg by mouth at bedtime as needed for sleep.  06/28/18   [provider]      Allergies    Penicillins and Pollen extract    Review of Systems   Review of Systems  Constitutional:  Negative for chills, diaphoresis and fever.  HENT:  Negative for congestion, ear pain, rhinorrhea and sore throat.   Eyes:  Negative for pain and visual disturbance.  Respiratory:  Negative for cough and shortness of breath.   Cardiovascular:  Negative for chest pain and palpitations.  Gastrointestinal:  Negative for abdominal pain, constipation, diarrhea, nausea and vomiting.  Genitourinary:  Positive for vaginal discharge and vaginal pain. Negative for decreased urine volume, difficulty urinating, dysuria, flank pain, genital sores, hematuria, pelvic pain and vaginal bleeding.  Musculoskeletal:  Negative for arthralgias, back pain, neck pain and neck stiffness.  Skin:  Negative for color change and rash.  Neurological:  Negative for dizziness, seizures, syncope, light-headedness, numbness and headaches.  All other systems reviewed and are negative.  Physical Exam Updated Vital Signs BP  (!) 141/70    Pulse 84    Temp 98.8 F (37.1 C) (Oral)    Resp 18    Ht 5\' 1"  (1.549 m)    Wt 103 kg    LMP 09/04/2021    SpO2 98%    BMI 42.89 kg/m  Physical Exam Vitals and nursing note reviewed. Exam conducted with a chaperone present.  Constitutional:      General: She is not in acute distress.    Appearance: Normal appearance. She is well-developed. She is obese. She is not ill-appearing or diaphoretic.  HENT:     Head: Normocephalic and atraumatic.  Eyes:     Conjunctiva/sclera: Conjunctivae normal.  Cardiovascular:     Rate and Rhythm: Normal rate and regular rhythm.     Pulses: Normal pulses.          Radial pulses are 2+ on the right side and 2+ on the left side.       Posterior tibial pulses are 2+ on the right side and 2+ on the left side.     Heart sounds: No murmur heard. Pulmonary:     Effort: Pulmonary effort is normal. No respiratory distress.     Breath sounds: Normal breath sounds.  Abdominal:     General: Abdomen is protuberant. A surgical scar is present. Bowel sounds are normal.     Palpations: Abdomen is soft.     Tenderness: There is no abdominal tenderness. There is no guarding. Negative signs include Murphy's sign and McBurney's sign.  Genitourinary:    General: Normal vulva.     Exam position: Lithotomy position.     Pubic Area: No rash.      Vagina: No signs of injury and foreign body. Vaginal discharge and tenderness present. No erythema, bleeding, lesions or prolapsed vaginal walls.     Cervix: No lesion, erythema, cervical bleeding or eversion.     Uterus: Not tender.      Rectum: Tenderness present. No anal fissure, external hemorrhoid or internal hemorrhoid. Normal anal tone.     Comments:  Labia without rash, tenderness, lesion, swelling, or injury bilaterally. Inspection of rectum and vagina without any signs of trauma, bleeding, lesion, erythema, swelling, obvious injury, or foreign body Mild tenderness of vagina elicited with speculum Mild  tenderness of rectum elicited with gloved finger Mild amount of malodorous white discharge noted in the vaginal canal, but not at the cervix Musculoskeletal:        General: No swelling.     Cervical back: Neck supple. No rigidity.  Skin:    General: Skin is warm and dry.     Capillary Refill: Capillary refill takes less than 2 seconds.  Neurological:     Mental Status: She is alert.  Psychiatric:        Mood and Affect: Mood normal.        Behavior: Behavior is cooperative.    ED Results / Procedures / Treatments   Labs (all labs ordered are listed, but only abnormal results are displayed) Labs Reviewed  WET PREP, GENITAL -  Abnormal; Notable for the following components:      Result Value   Clue Cells Wet Prep HPF POC PRESENT (*)    All other components within normal limits  URINALYSIS, ROUTINE W REFLEX MICROSCOPIC  POC URINE PREG, ED    EKG None  Radiology No results found.  Procedures Procedures    Medications Ordered in ED Medications - No data to display  ED Course/ Medical Decision Making/ A&P                           Medical Decision Making Amount and/or Complexity of Data Reviewed Labs: ordered.  Risk Prescription drug management.   41 year old female presents to the ED for concern of vaginal and rectal pain after intercourse.  This involves an extensive number of treatment options, and is a complaint that carries with it a high risk of complications and morbidity.  The differential diagnosis includes pelvic inflammatory disease, infection such as bacterial vaginosis or Candida or Trichomonas, lacerations or skin trauma.  Comorbidities that complicate the patient evaluation include bipolar disorder, diabetes mellitus type 2, herpes simplex virus, history of pulmonary embolism  Additional history obtained from internal/external records available via epic  I ordered, and personally interpreted labs.  The pertinent results include: Urinalysis was  unremarkable.  Normal appearance and no blood detected, correlates with vaginal canal negative for trauma on physical exam.  Pregnancy unlikely due to negative beta hCG.  Wet prep was positive for clue cells, likely due to bacterial vaginosis which matches the discharge and presentation noted on physical exam.  Strawberry cervix not noted on physical exam and trichomonas wet prep negative, unlikely a trichomonal infection.  Patient has a history of herpes simplex outbreaks, but no evidence on physical exam.  I considered ordering pelvic ultrasound to evaluate for other causes such as pelvic inflammatory disease, but patient was very mildly tender on physical exam and cervix was not erythematous.  Patient also had no abdominal pain in the pelvic area or lower left or lower right quadrant.  Very unlikely the patient has PID or TOA.  ED Course: Patient engaged in rough intercourse 4 days ago.  Again, pregnancy test negative.  No pelvic tenderness or adnexal tenderness.  No inflammation or erythema at the cervix, or discharge vacating the cervix.  Unlikely PID at this time.  No previous abnormal Pap smear.  Physical exam, patient presentation, and history do not support a TOA.  No evidence of a herpes simplex outbreak on exam and if so this would likely be an incidental finding.  Unlikely yeast infection due to history and discharge appearance on exam and negative lab results.  No evidence of vaginal or rectal hemorrhage or hematochezia on physical exam.  I believe the patient is likely tender due to the activity itself and bacterial vaginosis.  No obvious evidence of trauma, tenderness likely to dissipate with antibiotics for the bacterial vaginosis and with rest.  Will recommend alternating OTC Tylenol and ibuprofen for outpatient pain relief.  Feel comfortable that the patient still able to pass bowel movements and has no dysuria or other significant urinary symptoms.  Encourage patient to follow-up with primary  care in the next 3 to 5 days for reassessment.  Dispostion: I discussed the patient and their case with my attending, Dr. Dayna Barker, who agreed with the proposed treatment course.  After consideration of the diagnostic results and the patients response to treatment, I feel that the patent would benefit  from discharge home at this time with Flagyl for her BV and pain management of alternating Tylenol and ibuprofen outpatient.  Discussed course of treatment thoroughly with the patient and she demonstrated understanding.  Patient in agreement and has no further questions.        Final Clinical Impression(s) / ED Diagnoses Final diagnoses:  BV (bacterial vaginosis)  Pain, rectal  Vaginal pain    Rx / DC Orders ED Discharge Orders          Ordered    metroNIDAZOLE (FLAGYL) 500 MG tablet  2 times daily        10/06/21 0123              Prince Rome, PA-C 76/14/70 0913    Mesner, Corene Cornea, MD 10/07/21 [phone]

## 2021-10-06 NOTE — Discharge Instructions (Addendum)
An antibiotic treatment has been provided for you by the name of Flagyl.  Take this twice a day until all the pills are gone.  Stop taking this medication if you develop a rash or trouble breathing.  Manage pain by alternating Tylenol and ibuprofen.  Follow-up with your primary care in the next week for continued medical management and reassessment.  Return to the ED for new or worsening symptoms including but not limited to increase in vaginal or rectal pain, inability to pass stool, fever, new pelvic pain, abnormal bleeding from the vagina or rectum.

## 2021-10-06 NOTE — ED Provider Notes (Signed)
I provided a substantive portion of the care of this patient.  I personally performed the entirety of the history, exam, and medical decision making for this encounter.  41 year old female who had run off anal and vaginal intercourse couple days ago with persistent swelling feeling around her vagina and in her rectum.  No bleeding.  No urinary or defecation problems.  She comes in today because she expected symptoms to be better.  She states she does have a little bit of increased urgency but no dysuria, frequency or malodorous urine.  PA Elmo Putt performed the pelvic exam did not see any lacerations no cervicitis no obvious blood on rectal exam.  I suspect that the patient is just has muscular and soft tissue soreness from the traumatic rough intercourse.  She does have clue cells so we will treat for bacterial vaginosis however I think that is unlikely related to her symptoms.  She We will follow-up with OB if not improving about 5 to 7 days.      Merrily Pew, MD 10/07/21 [phone]

## 2021-10-07 ENCOUNTER — Ambulatory Visit (HOSPITAL_COMMUNITY): Payer: Medicaid Other | Admitting: Clinical

## 2021-10-07 ENCOUNTER — Encounter (HOSPITAL_COMMUNITY): Payer: Self-pay

## 2021-10-07 ENCOUNTER — Telehealth (HOSPITAL_COMMUNITY): Payer: Self-pay | Admitting: Clinical

## 2021-10-07 NOTE — Telephone Encounter (Signed)
Therapist attempted to send client a link through video visit via [place] with clients number listed on file.  Client did not respond to the link.  Therapist followed up with a phone call to the client.  The clients phone indicated a call could not be completed.  Therapist was unable to leave a voice mail.

## 2021-10-28 ENCOUNTER — Telehealth (HOSPITAL_COMMUNITY): Payer: Self-pay | Admitting: Clinical

## 2021-10-28 ENCOUNTER — Ambulatory Visit (HOSPITAL_COMMUNITY): Payer: Medicaid Other | Admitting: Clinical

## 2021-10-28 ENCOUNTER — Encounter (HOSPITAL_COMMUNITY): Payer: Self-pay

## 2021-10-28 NOTE — Telephone Encounter (Signed)
Therapist at the client a link for the scheduled virtual therapy appointment.  Client did not respond to text message link.  Therapist followed up with a telephone call to the client.  Client did not answer the phone and no voicemail option was available for the therapist to leave a message.

## 2021-11-25 ENCOUNTER — Emergency Department (HOSPITAL_COMMUNITY)
Admission: EM | Admit: 2021-11-25 | Discharge: 2021-11-25 | Disposition: A | Discharge status: Home / Self Care | Payer: Medicaid Other | Attending: Emergency Medicine | Admitting: Emergency Medicine

## 2021-11-25 ENCOUNTER — Other Ambulatory Visit: Payer: Self-pay

## 2021-11-25 DIAGNOSIS — M542 Cervicalgia: Secondary | ICD-10-CM | POA: Diagnosis not present

## 2021-11-25 DIAGNOSIS — M791 Myalgia, unspecified site: Secondary | ICD-10-CM | POA: Diagnosis present

## 2021-11-25 DIAGNOSIS — M25551 Pain in right hip: Secondary | ICD-10-CM | POA: Insufficient documentation

## 2021-11-25 DIAGNOSIS — M25512 Pain in left shoulder: Secondary | ICD-10-CM | POA: Insufficient documentation

## 2021-11-25 DIAGNOSIS — M545 Low back pain, unspecified: Secondary | ICD-10-CM | POA: Insufficient documentation

## 2021-11-25 DIAGNOSIS — M25552 Pain in left hip: Secondary | ICD-10-CM | POA: Diagnosis not present

## 2021-11-25 DIAGNOSIS — E876 Hypokalemia: Secondary | ICD-10-CM | POA: Diagnosis not present

## 2021-11-25 DIAGNOSIS — Z7901 Long term (current) use of anticoagulants: Secondary | ICD-10-CM | POA: Insufficient documentation

## 2021-11-25 LAB — CBC WITH DIFFERENTIAL/PLATELET
Abs Immature Granulocytes: 0.03 10*3/uL (ref 0.00–0.07)
Basophils Absolute: 0.1 10*3/uL (ref 0.0–0.1)
Basophils Relative: 1 %
Eosinophils Absolute: 0.1 10*3/uL (ref 0.0–0.5)
Eosinophils Relative: 1 %
HCT: 35.7 % — ABNORMAL LOW (ref 36.0–46.0)
Hemoglobin: 11.7 g/dL — ABNORMAL LOW (ref 12.0–15.0)
Immature Granulocytes: 0 %
Lymphocytes Relative: 35 %
Lymphs Abs: 2.5 10*3/uL (ref 0.7–4.0)
MCH: 29.5 pg (ref 26.0–34.0)
MCHC: 32.8 g/dL (ref 30.0–36.0)
MCV: 89.9 fL (ref 80.0–100.0)
Monocytes Absolute: 0.4 10*3/uL (ref 0.1–1.0)
Monocytes Relative: 6 %
Neutro Abs: 4.1 10*3/uL (ref 1.7–7.7)
Neutrophils Relative %: 57 %
Platelets: 401 10*3/uL — ABNORMAL HIGH (ref 150–400)
RBC: 3.97 MIL/uL (ref 3.87–5.11)
RDW: 14.2 % (ref 11.5–15.5)
WBC: 7.1 10*3/uL (ref 4.0–10.5)
nRBC: 0 % (ref 0.0–0.2)

## 2021-11-25 LAB — I-STAT BETA HCG BLOOD, ED (MC, WL, AP ONLY): I-stat hCG, quantitative: <5 m[IU]/mL (ref <5)

## 2021-11-25 LAB — URINALYSIS, ROUTINE W REFLEX MICROSCOPIC
Bilirubin Urine: NEGATIVE
Glucose, UA: NEGATIVE mg/dL
Hgb urine dipstick: NEGATIVE
Ketones, ur: NEGATIVE mg/dL
Leukocytes,Ua: NEGATIVE
Nitrite: NEGATIVE
Protein, ur: NEGATIVE mg/dL
Specific Gravity, Urine: 1.024 (ref 1.005–1.030)
pH: 5 (ref 5.0–8.0)

## 2021-11-25 LAB — BASIC METABOLIC PANEL
Anion gap: 12 (ref 5–15)
BUN: 9 mg/dL (ref 6–20)
CO2: 21 mmol/L — ABNORMAL LOW (ref 22–32)
Calcium: 9.7 mg/dL (ref 8.9–10.3)
Chloride: 106 mmol/L (ref 98–111)
Creatinine, Ser: 0.99 mg/dL (ref 0.44–1.00)
GFR, Estimated: >60 mL/min (ref >60)
Glucose, Bld: 98 mg/dL (ref 70–99)
Potassium: 3.2 mmol/L — ABNORMAL LOW (ref 3.5–5.1)
Sodium: 139 mmol/L (ref 135–145)

## 2021-11-25 LAB — CK: Total CK: 116 U/L (ref 38–234)

## 2021-11-25 MED ORDER — SODIUM CHLORIDE 0.9 % IV BOLUS
500.0000 mL | Freq: Once | INTRAVENOUS | Status: AC
  Administered 2021-11-25: 500 mL via INTRAVENOUS

## 2021-11-25 MED ORDER — POTASSIUM CHLORIDE CRYS ER 20 MEQ PO TBCR
40.0000 meq | EXTENDED_RELEASE_TABLET | Freq: Once | ORAL | Status: AC
  Administered 2021-11-25: 40 meq via ORAL
  Filled 2021-11-25: qty 2

## 2021-11-25 MED ORDER — DIPHENHYDRAMINE HCL 25 MG PO CAPS
25.0000 mg | ORAL_CAPSULE | Freq: Once | ORAL | Status: AC
  Administered 2021-11-25: 25 mg via ORAL
  Filled 2021-11-25: qty 1

## 2021-11-25 MED ORDER — MORPHINE SULFATE (PF) 4 MG/ML IV SOLN
4.0000 mg | Freq: Once | INTRAVENOUS | Status: AC
Start: 2021-11-25 — End: 2021-11-25
  Administered 2021-11-25: 4 mg via INTRAVENOUS
  Filled 2021-11-25: qty 1

## 2021-11-25 NOTE — ED Notes (Signed)
No answer

## 2021-11-25 NOTE — Discharge Instructions (Signed)
Your labs showed a low potassium which we have supplemented here.  Follow-up with primary care provider within the week to have repeat levels checked.  This could be the cause of your pain. ? ?Take your home pain medicine ? ?Return for new or worsening symptoms ?

## 2021-11-25 NOTE — ED Notes (Signed)
Called for patient 2 times no answer. ?

## 2021-11-25 NOTE — ED Provider Notes (Signed)
?[facility] ?Provider Note ? ? ?CSN: [id number] ?Arrival date & time: 11/25/21  1100 ? ?  ? ?History ? ?Chief Complaint  ?Patient presents with  ? Back Pain  ? Shoulder Pain  ? Hypertension  ? ? ?[name] is a 41 y.o. female history of chronic neck, back pain takes tramadol, here for evaluation of myalgias.  Patient states she has pain from her neck to her lower back as well as her bilateral hips and left shoulder.  Feels like her typical pain however worse.  No recent injury or trauma.  She is ambulatory without difficulty.  No headache, numbness, weakness, chest pain, shortness of breath, abdominal pain, diarrhea, dysuria.  She has been ambulatory PTA.  No bowel or bladder incontinence, saddle paresthesia, history of IV drug use.  No cough, congestion, rhinorrhea, sick contacts. ? ?HPI ? ?  ? ?Home Medications ?Prior to Admission medications   ?Medication Sig Start Date End Date Taking? Authorizing Provider  ?busPIRone (BUSPAR) 10 MG tablet Take 1 tablet (10 mg total) by mouth 2 (two) times daily. 07/01/21   Scot Jun, FNP  ?doxepin (SINEQUAN) 25 MG capsule Take 25 mg by mouth at bedtime. 08/06/20   [provider]  ?HYDROcodone-acetaminophen (NORCO/VICODIN) 5-325 MG tablet Take 1 tablet by mouth every 6 (six) hours as needed for severe pain. 08/12/21   Tedd Sias, PA  ?hydrOXYzine (VISTARIL) 50 MG capsule Take 1 capsule (50 mg total) by mouth 3 (three) times daily as needed for anxiety. 07/01/21   Scot Jun, FNP  ?lamoTRIgine (LAMICTAL) 150 MG tablet Take 150 mg by mouth at bedtime. 07/23/20   [provider]  ?levothyroxine (SYNTHROID, LEVOTHROID) 112 MCG tablet Take 112 mcg by mouth daily before breakfast.  07/15/17   [provider]  ?lisinopril-hydrochlorothiazide (ZESTORETIC) 10-12.5 MG tablet Take 1 tablet by mouth daily. 08/29/20   [provider]  ?metFORMIN (GLUCOPHAGE) 500 MG tablet Take 500 mg by  mouth daily with breakfast. 07/15/20   [provider]  ?metoprolol (LOPRESSOR) 50 MG tablet Take 50 mg by mouth 2 (two) times daily. 09/12/15   [provider]  ?metroNIDAZOLE (FLAGYL) 500 MG tablet Take 1 tablet (500 mg total) by mouth 2 (two) times daily. 12/15/79   Prince Rome, PA-C  ?rivaroxaban (XARELTO) 20 MG TABS tablet Take 20 mg by mouth every evening.     [provider]  ?sertraline (ZOLOFT) 100 MG tablet Take 1 tablet by mouth at bedtime. 02/13/20   [provider]  ?traZODone (DESYREL) 50 MG tablet Take 1 tablet by mouth at bedtime. 03/31/20   [provider]  ?zolpidem (AMBIEN) 10 MG tablet Take 10 mg by mouth at bedtime as needed for sleep.  06/28/18   [provider]  ?   ? ?Allergies    ?Penicillins and Pollen extract   ? ?Review of Systems   ?Review of Systems  ?Constitutional: Negative.   ?HENT: Negative.    ?Respiratory: Negative.    ?Cardiovascular: Negative.   ?Gastrointestinal: Negative.   ?Genitourinary: Negative.   ?Musculoskeletal:  Positive for arthralgias, back pain, myalgias, neck pain and neck stiffness. Negative for gait problem and joint swelling.  ?Skin: Negative.   ?Neurological: Negative.   ?All other systems reviewed and are negative. ? ?Physical Exam ?Updated Vital Signs ?BP (!) 164/91   Pulse 64   Temp 98.2 ?F (36.8 ?C)   Resp 18   SpO2 98%  ?Physical Exam ?Vitals and  nursing note reviewed.  ?Constitutional:   ?   General: She is not in acute distress. ?   Appearance: She is well-developed. She is not ill-appearing, toxic-appearing or diaphoretic.  ?   Comments: Standing at bedside, making the bed, eating  ?HENT:  ?   Head: Normocephalic and atraumatic.  ?   Nose: Nose normal.  ?   Mouth/Throat:  ?   Mouth: Mucous membranes are moist.  ?Eyes:  ?   Pupils: Pupils are equal, round, and reactive to light.  ?Neck:  ?   Comments: No midline cervical tenderness, full range of motion without difficulty ?Cardiovascular:  ?    Rate and Rhythm: Normal rate.  ?   Pulses: Normal pulses.  ?   Heart sounds: Normal heart sounds.  ?Pulmonary:  ?   Effort: Pulmonary effort is normal. No respiratory distress.  ?   Breath sounds: Normal breath sounds.  ?   Comments: Clear bilaterally, speaks in full sentences without difficulty ?Abdominal:  ?   General: Bowel sounds are normal. There is no distension.  ?   Palpations: Abdomen is soft.  ?   Tenderness: There is no abdominal tenderness. There is right CVA tenderness. There is no left CVA tenderness or guarding.  ?   Comments: Soft, nontender, negative CVA tap bilaterally.  ?Musculoskeletal:     ?   General: Normal range of motion.  ?   Cervical back: Normal range of motion.  ?   Comments: Diffuse tenderness to bilateral paraspinal region, bilateral shoulder, trapezius, bilateral piriformis.  No midline C/T/L tenderness.  Full range of motion at all joints.  No overlying erythema, warmth to joints.  Compartment soft.  Negative straight leg raise bilaterally  ?Skin: ?   General: Skin is warm and dry.  ?   Capillary Refill: Capillary refill takes less than 2 seconds.  ?   Comments: No edema, erythema or warmth  ?Neurological:  ?   General: No focal deficit present.  ?   Mental Status: She is alert and oriented to person, place, and time.  ?   Comments: Cranial nerves II through XII grossly intact ?Ambulatory ?Equal strength ?Intact sensation  ?Psychiatric:     ?   Mood and Affect: Mood normal.  ? ? ?ED Results / Procedures / Treatments   ?Labs ?(all labs ordered are listed, but only abnormal results are displayed) ?Labs Reviewed  ?CBC WITH DIFFERENTIAL/PLATELET - Abnormal; Notable for the following components:  ?    Result Value  ? Hemoglobin 11.7 (*)   ? HCT 35.7 (*)   ? Platelets 401 (*)   ? All other components within normal limits  ?BASIC METABOLIC PANEL - Abnormal; Notable for the following components:  ? Potassium 3.2 (*)   ? CO2 21 (*)   ? All other components within normal limits  ?URINALYSIS,  ROUTINE W REFLEX MICROSCOPIC - Abnormal; Notable for the following components:  ? APPearance HAZY (*)   ? All other components within normal limits  ?CK  ?I-STAT BETA HCG BLOOD, ED ([facility], [facility], [facility] ONLY)  ? ? ?EKG ?None ? ?Radiology ?No results found. ? ?Procedures ?Procedures  ? ? ?Medications Ordered in ED ?Medications  ?sodium chloride 0.9 % bolus 500 mL (0 mLs Intravenous Stopped 11/25/21 2029)  ?morphine (PF) 4 MG/ML injection 4 mg (4 mg Intravenous Given 11/25/21 1814)  ?potassium chloride SA (KLOR-CON M) CR tablet 40 mEq (40 mEq Oral Given 11/25/21 2029)  ?diphenhydrAMINE (BENADRYL) capsule 25 mg (25 mg Oral  Given 11/25/21 2029)  ? ? ?ED Course/ Medical Decision Making/ A&P ?  ? ?41 year old here for evaluation of myalgias and arthralgias.  No recent injury or trauma.  She is nonfocal neuro exam without deficits.  She has full range of motion to all her major joints without any overlying erythema, warmth.  Low suspicion for septic arthritis.  Denies chest pain, abdominal pain, heart and lungs clear.  Abdomen soft, nontender.  Patient is afebrile, no bowel or bladder incontinence, saddle paresthesia, low suspicion for acute neurosurgical emergency such as cauda equina, discitis, osteomyelitis, transverse myelitis, psoas abscess.  No urinary complaints, negative CVA tap, denies chance of pregnancy.  Suspect acute on chronic arthritic pain which she already takes tramadol for however we will plan on checking basic labs she is has a history of hypokalemia, ensure no electrolyte abnormality causing her myalgia. ? ?Labs personally viewed and interpreted: ?UA negative for infection ?Pregnancy test negative ?CK 116 ?CBC without leukocytosis, hemoglobin 11.7 similar to prior ?Metabolic panel hypokalemia potassium 3.2, given supplementation ? ?Patient reassessed.  Pain improved.  She is ambulatory, tolerating p.o. intake.  Suspect this is acute on chronic, she did have low potassium which could certainly contribute to some of  her myalgias.  I encouraged taking her home tramadol, increase her fluids, increase her potassium rich foods, close follow-up with PCP.  She is agreeable. ? ?The patient has been appropriately medically screened

## 2021-11-25 NOTE — ED Notes (Signed)
Patient stated she went to the cafeteria. Pt moved back to [place].  ?

## 2021-11-25 NOTE — ED Triage Notes (Signed)
Pt. Stated, my arthritis is acting up. My back, shoulder, and around the waist line has been hurting really bad and my BP is up I think because of the pain. ?

## 2021-11-25 NOTE — ED Provider Triage Note (Signed)
Emergency Medicine Provider Triage Evaluation Note ? ?Windy Fast , a 41 y.o. female  was evaluated in triage.  Pt complains of pain in her neck, left shoulder, back and hips.  History of arthritis and feels as though it is acting up.  Also says she has been hypertensive due to the pain.  Spoke with her primary care provider and she told her to go to the emergency department yesterday.  She did not however today the pain worsened and she decided to come in ? ?Review of Systems  ?Positive: Joint pain, hypertension ?Negative: Fevers or chills, chest pain or headache ? ?Physical Exam  ?BP (!) 164/102 (BP Location: Right Arm)   Pulse 76   Temp 98.9 ?F (37.2 ?C) (Oral)   Resp 18   SpO2 100%  ?Gen:   Awake, no distress   ?Resp:  Normal effort  ?MSK:   Moves extremities without difficulty  ?Other:  No point tenderness.  No midline tenderness. ? ?Medical Decision Making  ?Medically screening exam initiated at 11:24 AM.  Appropriate orders placed.  RUHANI UMLAND was informed that the remainder of the evaluation will be completed by another provider, this initial triage assessment does not replace that evaluation, and the importance of remaining in the ED until their evaluation is complete. ? ? ?No imaging ordered at this time, chronic pain/arthritis ?  ?Rhae Hammock, PA-C ?11/25/21 1126 ? ?

## 2022-02-20 ENCOUNTER — Ambulatory Visit: Payer: Medicaid Other | Admitting: Obstetrics and Gynecology

## 2022-03-16 ENCOUNTER — Emergency Department (HOSPITAL_COMMUNITY)
Admission: EM | Admit: 2022-03-16 | Discharge: 2022-03-16 | Disposition: A | Discharge status: Home / Self Care | Payer: Medicaid Other | Attending: Emergency Medicine | Admitting: Emergency Medicine

## 2022-03-16 ENCOUNTER — Other Ambulatory Visit: Payer: Self-pay

## 2022-03-16 ENCOUNTER — Emergency Department (HOSPITAL_COMMUNITY): Payer: Medicaid Other

## 2022-03-16 ENCOUNTER — Encounter (HOSPITAL_COMMUNITY): Payer: Self-pay

## 2022-03-16 DIAGNOSIS — R110 Nausea: Secondary | ICD-10-CM | POA: Diagnosis not present

## 2022-03-16 DIAGNOSIS — I10 Essential (primary) hypertension: Secondary | ICD-10-CM | POA: Insufficient documentation

## 2022-03-16 DIAGNOSIS — R1084 Generalized abdominal pain: Secondary | ICD-10-CM | POA: Diagnosis not present

## 2022-03-16 DIAGNOSIS — R197 Diarrhea, unspecified: Secondary | ICD-10-CM | POA: Diagnosis not present

## 2022-03-16 DIAGNOSIS — R140 Abdominal distension (gaseous): Secondary | ICD-10-CM

## 2022-03-16 DIAGNOSIS — Z79899 Other long term (current) drug therapy: Secondary | ICD-10-CM | POA: Diagnosis not present

## 2022-03-16 DIAGNOSIS — R630 Anorexia: Secondary | ICD-10-CM | POA: Diagnosis not present

## 2022-03-16 LAB — CBC WITH DIFFERENTIAL/PLATELET
Abs Immature Granulocytes: 0.03 10*3/uL (ref 0.00–0.07)
Basophils Absolute: 0.1 10*3/uL (ref 0.0–0.1)
Basophils Relative: 1 %
Eosinophils Absolute: 0.4 10*3/uL (ref 0.0–0.5)
Eosinophils Relative: 6 %
HCT: 34.2 % — ABNORMAL LOW (ref 36.0–46.0)
Hemoglobin: 10.9 g/dL — ABNORMAL LOW (ref 12.0–15.0)
Immature Granulocytes: 0 %
Lymphocytes Relative: 42 %
Lymphs Abs: 2.9 10*3/uL (ref 0.7–4.0)
MCH: 30.4 pg (ref 26.0–34.0)
MCHC: 31.9 g/dL (ref 30.0–36.0)
MCV: 95.3 fL (ref 80.0–100.0)
Monocytes Absolute: 0.3 10*3/uL (ref 0.1–1.0)
Monocytes Relative: 4 %
Neutro Abs: 3.2 10*3/uL (ref 1.7–7.7)
Neutrophils Relative %: 47 %
Platelets: 486 10*3/uL — ABNORMAL HIGH (ref 150–400)
RBC: 3.59 MIL/uL — ABNORMAL LOW (ref 3.87–5.11)
RDW: 14.8 % (ref 11.5–15.5)
WBC: 6.8 10*3/uL (ref 4.0–10.5)
nRBC: 0 % (ref 0.0–0.2)

## 2022-03-16 LAB — URINALYSIS, ROUTINE W REFLEX MICROSCOPIC
Bilirubin Urine: NEGATIVE
Glucose, UA: NEGATIVE mg/dL
Hgb urine dipstick: NEGATIVE
Ketones, ur: 5 mg/dL — AB
Leukocytes,Ua: NEGATIVE
Nitrite: NEGATIVE
Protein, ur: NEGATIVE mg/dL
Specific Gravity, Urine: 1.026 (ref 1.005–1.030)
pH: 5 (ref 5.0–8.0)

## 2022-03-16 LAB — COMPREHENSIVE METABOLIC PANEL
ALT: 17 U/L (ref 0–44)
AST: 40 U/L (ref 15–41)
Albumin: 4.4 g/dL (ref 3.5–5.0)
Alkaline Phosphatase: 72 U/L (ref 38–126)
Anion gap: 11 (ref 5–15)
BUN: 10 mg/dL (ref 6–20)
CO2: 20 mmol/L — ABNORMAL LOW (ref 22–32)
Calcium: 9.6 mg/dL (ref 8.9–10.3)
Chloride: 106 mmol/L (ref 98–111)
Creatinine, Ser: 1.17 mg/dL — ABNORMAL HIGH (ref 0.44–1.00)
GFR, Estimated: >60 mL/min (ref >60)
Glucose, Bld: 78 mg/dL (ref 70–99)
Potassium: 3.9 mmol/L (ref 3.5–5.1)
Sodium: 137 mmol/L (ref 135–145)
Total Bilirubin: 0.6 mg/dL (ref 0.3–1.2)
Total Protein: 7.5 g/dL (ref 6.5–8.1)

## 2022-03-16 LAB — LIPASE, BLOOD: Lipase: 25 U/L (ref 11–51)

## 2022-03-16 LAB — I-STAT BETA HCG BLOOD, ED (MC, WL, AP ONLY): I-stat hCG, quantitative: <5 m[IU]/mL (ref <5)

## 2022-03-16 MED ORDER — IOHEXOL 300 MG/ML  SOLN
100.0000 mL | Freq: Once | INTRAMUSCULAR | Status: AC | PRN
  Administered 2022-03-16: 100 mL via INTRAVENOUS

## 2022-03-16 MED ORDER — MORPHINE SULFATE (PF) 4 MG/ML IV SOLN
4.0000 mg | Freq: Once | INTRAVENOUS | Status: AC
  Administered 2022-03-16: 4 mg via INTRAVENOUS
  Filled 2022-03-16: qty 1

## 2022-03-16 MED ORDER — SODIUM CHLORIDE 0.9 % IV BOLUS
1000.0000 mL | Freq: Once | INTRAVENOUS | Status: AC
  Administered 2022-03-16: 1000 mL via INTRAVENOUS

## 2022-03-16 NOTE — ED Triage Notes (Signed)
Pt arrived POV from home c/o loss of appetite, abdominal pain and distension x2 weeks. Pt states in the past she had this happened and had a bowel obstruction.

## 2022-03-16 NOTE — ED Provider Notes (Signed)
[name]   CSN: [id number] Arrival date & time: 03/16/22  1354     History PMH: HTN, hx of PE, Hyperthyroidism, hx of bowel obstruction, multiple hernia repairs Chief Complaint  Patient presents with   Abdominal Pain    [name] is a 41 y.o. female. Presents with abdominal bloating, generalized abdominal pain for about 2 weeks.  She said several days ago she started having diarrhea that was every single day and very watery.  She says today she stopped having bowel movements.  Stated nausea without vomiting, and decreased appetite.  She says the bloating is gotten worse, and the last time she felt like this, she had a bowel obstruction.  Any fevers, chills, chest pain, shortness of breath.   Abdominal Pain Associated symptoms: diarrhea and nausea   Associated symptoms: no chest pain, no chills, no fever, no shortness of breath and no vomiting        Home Medications Prior to Admission medications   Medication Sig Start Date End Date Taking? Authorizing Provider  busPIRone (BUSPAR) 10 MG tablet Take 1 tablet (10 mg total) by mouth 2 (two) times daily. 07/01/21   Scot Jun, FNP  doxepin (SINEQUAN) 25 MG capsule Take 25 mg by mouth at bedtime. 08/06/20   [provider]  HYDROcodone-acetaminophen (NORCO/VICODIN) 5-325 MG tablet Take 1 tablet by mouth every 6 (six) hours as needed for severe pain. 08/12/21   Tedd Sias, PA  hydrOXYzine (VISTARIL) 50 MG capsule Take 1 capsule (50 mg total) by mouth 3 (three) times daily as needed for anxiety. 07/01/21   Scot Jun, FNP  lamoTRIgine (LAMICTAL) 150 MG tablet Take 150 mg by mouth at bedtime. 07/23/20   [provider]  levothyroxine (SYNTHROID, LEVOTHROID) 112 MCG tablet Take 112 mcg by mouth daily before breakfast.  07/15/17   [provider]  lisinopril-hydrochlorothiazide (ZESTORETIC) 10-12.5 MG tablet Take 1 tablet by mouth  daily. 08/29/20   [provider]  metFORMIN (GLUCOPHAGE) 500 MG tablet Take 500 mg by mouth daily with breakfast. 07/15/20   [provider]  metoprolol (LOPRESSOR) 50 MG tablet Take 50 mg by mouth 2 (two) times daily. 09/12/15   [provider]  metroNIDAZOLE (FLAGYL) 500 MG tablet Take 1 tablet (500 mg total) by mouth 2 (two) times daily. 8/76/81   Prince Rome, PA-C  rivaroxaban (XARELTO) 20 MG TABS tablet Take 20 mg by mouth every evening.     [provider]  sertraline (ZOLOFT) 100 MG tablet Take 1 tablet by mouth at bedtime. 02/13/20   [provider]  traZODone (DESYREL) 50 MG tablet Take 1 tablet by mouth at bedtime. 03/31/20   [provider]  zolpidem (AMBIEN) 10 MG tablet Take 10 mg by mouth at bedtime as needed for sleep.  06/28/18   [provider]      Allergies    Penicillins and Pollen extract    Review of Systems   Review of Systems  Constitutional:  Negative for chills and fever.  Respiratory:  Negative for shortness of breath.   Cardiovascular:  Negative for chest pain.  Gastrointestinal:  Positive for abdominal distention, abdominal pain, diarrhea and nausea. Negative for vomiting.  All other systems reviewed and are negative.   Physical Exam Updated Vital Signs BP 126/77   Pulse 70   Temp 97.8 F (36.6 C) (Oral)   Resp 16   Ht '5\' 1"'$  (1.549 m)   Wt  90.7 kg   SpO2 100%   BMI 37.79 kg/m  Physical Exam Vitals and nursing note reviewed.  Constitutional:      General: She is not in acute distress.    Appearance: Normal appearance. She is not ill-appearing, toxic-appearing or diaphoretic.  HENT:     Head: Normocephalic and atraumatic.     Nose: No nasal deformity.     Mouth/Throat:     Lips: Pink. No lesions.     Mouth: Mucous membranes are moist. No injury, lacerations, oral lesions or angioedema.     Pharynx: Oropharynx is clear. Uvula midline. No pharyngeal swelling, oropharyngeal exudate,  posterior oropharyngeal erythema or uvula swelling.  Eyes:     General: Gaze aligned appropriately. No scleral icterus.       Right eye: No discharge.        Left eye: No discharge.     Conjunctiva/sclera: Conjunctivae normal.     Right eye: Right conjunctiva is not injected. No exudate or hemorrhage.    Left eye: Left conjunctiva is not injected. No exudate or hemorrhage.    Pupils: Pupils are equal, round, and reactive to light.  Cardiovascular:     Rate and Rhythm: Normal rate and regular rhythm.     Pulses: Normal pulses.          Radial pulses are 2+ on the right side and 2+ on the left side.       Dorsalis pedis pulses are 2+ on the right side and 2+ on the left side.     Heart sounds: Normal heart sounds, S1 normal and S2 normal. Heart sounds not distant. No murmur heard.    No friction rub. No gallop. No S3 or S4 sounds.  Pulmonary:     Effort: Pulmonary effort is normal. No accessory muscle usage or respiratory distress.     Breath sounds: Normal breath sounds. No stridor. No wheezing, rhonchi or rales.  Chest:     Chest wall: No tenderness.  Abdominal:     General: Abdomen is flat. Bowel sounds are normal. There is no distension.     Palpations: Abdomen is soft. There is no mass or pulsatile mass.     Tenderness: There is generalized abdominal tenderness. There is no right CVA tenderness, left CVA tenderness, guarding or rebound. Negative signs include Murphy's sign, Rovsing's sign and McBurney's sign.     Hernia: No hernia is present.  Musculoskeletal:     Right lower leg: No edema.     Left lower leg: No edema.  Skin:    General: Skin is warm and dry.     Coloration: Skin is not jaundiced or pale.     Findings: No bruising, erythema, lesion or rash.  Neurological:     General: No focal deficit present.     Mental Status: She is alert and oriented to person, place, and time.     GCS: GCS eye subscore is 4. GCS verbal subscore is 5. GCS motor subscore is 6.   Psychiatric:        Mood and Affect: Mood normal.        Behavior: Behavior normal. Behavior is cooperative.     ED Results / Procedures / Treatments   Labs (all labs ordered are listed, but only abnormal results are displayed) Labs Reviewed  COMPREHENSIVE METABOLIC PANEL - Abnormal; Notable for the following components:      Result Value   CO2 20 (*)    Creatinine, Ser 1.17 (*)  All other components within normal limits  CBC WITH DIFFERENTIAL/PLATELET - Abnormal; Notable for the following components:   RBC 3.59 (*)    Hemoglobin 10.9 (*)    HCT 34.2 (*)    Platelets 486 (*)    All other components within normal limits  URINALYSIS, ROUTINE W REFLEX MICROSCOPIC - Abnormal; Notable for the following components:   APPearance HAZY (*)    Ketones, ur 5 (*)    All other components within normal limits  LIPASE, BLOOD  I-STAT BETA HCG BLOOD, ED ([facility], [facility], [facility] ONLY)    EKG None  Radiology CT ABDOMEN PELVIS W CONTRAST  Result Date: 03/16/2022 CLINICAL DATA:  Bowel obstruction suspected. Abdominal pain and distension. EXAM: CT ABDOMEN AND PELVIS WITH CONTRAST TECHNIQUE: Multidetector CT imaging of the abdomen and pelvis was performed using the standard protocol following bolus administration of intravenous contrast. RADIATION DOSE REDUCTION: This exam was performed according to the departmental dose-optimization program which includes automated exposure control, adjustment of the mA and/or kV according to patient size and/or use of iterative reconstruction technique. CONTRAST:  16m OMNIPAQUE IOHEXOL 300 MG/ML  SOLN COMPARISON:  CT abdomen and pelvis 12/02/2018 FINDINGS: Lower chest: No acute abnormality. Hepatobiliary: No focal liver abnormality is seen. Status post cholecystectomy. No biliary dilatation. Pancreas: Unremarkable. No pancreatic ductal dilatation or surrounding inflammatory changes. Spleen: Normal in size without focal abnormality. Adrenals/Urinary Tract: Adrenal glands are  unremarkable. Kidneys are normal, without renal calculi, focal lesion, or hydronephrosis. Bladder is unremarkable. Stomach/Bowel: Stomach is within normal limits. Appendix appears normal. No evidence of bowel wall thickening, distention, or inflammatory changes. There are scattered colonic diverticula. Vascular/Lymphatic: No significant vascular findings are present. No enlarged abdominal or pelvic lymph nodes. Reproductive: Uterus and left adnexa are unremarkable. There is a 2.9 cm cyst in the right ovary. Other: There is no ascites or free air. There is anterior abdominal wall scarring. No tiny fat containing ventral hernia seen just right of the umbilicus. Musculoskeletal: No acute or significant osseous findings. IMPRESSION: 1. No acute localizing process in the abdomen or pelvis. 2. 2.9 cm right ovarian simple-appearing cyst. No follow-up imaging is recommended. Reference: JACR 2020 Feb;17(2):248-254 3. Anterior abdominal wall scarring with small fat containing ventral hernia. 4. Colonic diverticulosis. Electronically Signed   By: ARonney AstersM.D.   On: 03/16/2022 21:31    Procedures Procedures   Medications Ordered in ED Medications  iohexol (OMNIPAQUE) 300 MG/ML solution 100 mL (100 mLs Intravenous Contrast Given 03/16/22 2111)  sodium chloride 0.9 % bolus 1,000 mL (1,000 mLs Intravenous New Bag/Given 03/16/22 2151)  morphine (PF) 4 MG/ML injection 4 mg (4 mg Intravenous Given 03/16/22 2150)    ED Course/ Medical Decision Making/ A&P                           Medical Decision Making Risk Prescription drug management.    MDM  This is a 41y.o. female who presents to the ED with generalized abdominal pain and bloating  Initial Impression Well appearing, no acute distress, stable vitals. Abdominal exam with generalized tenderness. Does not feel distended. No peritoneal signs.  CT scan ordered in triage. Will give IVF with decreased appetite and recent diarrheal illness. Pain meds  provided.   I personally ordered, reviewed, and interpreted all laboratory work and imaging and agree with radiologist interpretation. Results interpreted below: CBC with hgb 10.9 - stable, platelets 486 - chronically elevated CMP with Creat 1.17 - mildly elevated,  got IVF here Lipase normal Not pregnant UA not concerning for infection CT a/p without acute localizing process, 2.9 right simple ovarian cyst noted, small fat containing hernia that is not strangulated or incarcerated, No other acute findings  Assessment/Plan:  - Reassuring workup. Pain well controlled. Repeat abdominal exam reassuring. Do not think she needs admission. Stable for discharge. - f/u with PCP regarding ED f/u - GI referral for 2-3 GI symptoms - has appt with OBGYN for heavy periods, can also follow up regarding ovarian cyst.    Charting Requirements Additional history is obtained from:  Independent historian External Records from outside source obtained and reviewed including: prior CT scan, prior labs Social Determinants of Health:  none Pertinant PMH that complicates patient's illness: hx of bowel obstruction  Patient Care Problems that were addressed during this visit: - bloating: Acute illness with systemic symptoms - Generalized abdominal pain: Acute illness with systemic symptoms This patient was maintained on a cardiac monitor/telemetry. I personally viewed and interpreted the cardiac monitor which reveals an underlying rhythm of NSR Medications given in ED: IVF, Morphine Reevaluation of the patient after these medicines showed that the patient improved I have reviewed home medications and made changes accordingly.  Critical Care Interventions: n/a Consultations: n/a Disposition: discharge  Portions of this note were generated with Dragon dictation software. Dictation errors may occur despite best attempts at proofreading.     Final Clinical Impression(s) / ED Diagnoses Final diagnoses:   Bloating    Rx / DC Orders ED Discharge Orders     None         Adolphus Birchwood, PA-C 03/16/22 2235    Lucrezia Starch, MD 03/16/22 2332

## 2022-03-16 NOTE — ED Provider Triage Note (Signed)
Emergency Medicine Provider Triage Evaluation Note  [name] , a 41 y.o. female  was evaluated in triage.  Pt complains of abdominal pain and swelling.  This has been ongoing for two weeks.  She reports that in the past she had a bowel obstruction in the past and this feels similar. Last BM was this morning.  She reports nausea with out vomiting. History of multiple prior abdominal surgeries.    Physical Exam  BP 125/90   Pulse 69   Temp 97.8 F (36.6 C) (Oral)   Resp 16   Ht '5\' 1"'$  (1.549 m)   Wt 90.7 kg   SpO2 100%   BMI 37.79 kg/m  Gen:   Awake, no distress   Resp:  Normal effort  MSK:   Moves extremities without difficulty  Other:  Normal speech.   Medical Decision Making  Medically screening exam initiated at 3:11 PM.  Appropriate orders placed.  PEARL BENTS was informed that the remainder of the evaluation will be completed by another provider, this initial triage assessment does not replace that evaluation, and the importance of remaining in the ED until their evaluation is complete.  Note: Portions of this report may have been transcribed using voice recognition software. Every effort was made to ensure accuracy; however, inadvertent computerized transcription errors may be present    Lorin Glass, PA-C 03/16/22 1513

## 2022-03-16 NOTE — Discharge Instructions (Signed)
Please schedule a follow up visit with PCP I have given you a gastroenterology referral for your bloating, decreased appetite, and abdominal symptoms Please keep appointment with OBGYN and follow up regarding the ovarian cyst found on your CT scan.

## 2022-03-16 NOTE — ED Notes (Signed)
Patient transported to CT 

## 2022-04-07 ENCOUNTER — Ambulatory Visit: Payer: Medicaid Other | Admitting: Obstetrics and Gynecology

## 2022-06-07 ENCOUNTER — Emergency Department (HOSPITAL_COMMUNITY): Payer: Medicaid Other

## 2022-06-07 ENCOUNTER — Inpatient Hospital Stay (HOSPITAL_COMMUNITY)
Admission: EM | Admit: 2022-06-07 | Discharge: 2022-07-08 | DRG: 917 | Disposition: E | Payer: Medicaid Other | Attending: Pulmonary Disease | Admitting: Pulmonary Disease

## 2022-06-07 ENCOUNTER — Inpatient Hospital Stay (HOSPITAL_COMMUNITY): Payer: Medicaid Other

## 2022-06-07 DIAGNOSIS — J9601 Acute respiratory failure with hypoxia: Secondary | ICD-10-CM | POA: Diagnosis present

## 2022-06-07 DIAGNOSIS — G936 Cerebral edema: Secondary | ICD-10-CM | POA: Diagnosis present

## 2022-06-07 DIAGNOSIS — T447X1A Poisoning by beta-adrenoreceptor antagonists, accidental (unintentional), initial encounter: Principal | ICD-10-CM | POA: Diagnosis present

## 2022-06-07 DIAGNOSIS — Z86718 Personal history of other venous thrombosis and embolism: Secondary | ICD-10-CM | POA: Diagnosis not present

## 2022-06-07 DIAGNOSIS — Z7901 Long term (current) use of anticoagulants: Secondary | ICD-10-CM

## 2022-06-07 DIAGNOSIS — M79606 Pain in leg, unspecified: Secondary | ICD-10-CM | POA: Diagnosis present

## 2022-06-07 DIAGNOSIS — D649 Anemia, unspecified: Secondary | ICD-10-CM | POA: Diagnosis present

## 2022-06-07 DIAGNOSIS — R252 Cramp and spasm: Secondary | ICD-10-CM | POA: Diagnosis present

## 2022-06-07 DIAGNOSIS — Z6837 Body mass index (BMI) 37.0-37.9, adult: Secondary | ICD-10-CM

## 2022-06-07 DIAGNOSIS — K7200 Acute and subacute hepatic failure without coma: Secondary | ICD-10-CM | POA: Diagnosis present

## 2022-06-07 DIAGNOSIS — J9602 Acute respiratory failure with hypercapnia: Secondary | ICD-10-CM | POA: Diagnosis present

## 2022-06-07 DIAGNOSIS — H5704 Mydriasis: Secondary | ICD-10-CM | POA: Diagnosis present

## 2022-06-07 DIAGNOSIS — R001 Bradycardia, unspecified: Secondary | ICD-10-CM | POA: Diagnosis present

## 2022-06-07 DIAGNOSIS — E669 Obesity, unspecified: Secondary | ICD-10-CM | POA: Diagnosis present

## 2022-06-07 DIAGNOSIS — R579 Shock, unspecified: Secondary | ICD-10-CM | POA: Diagnosis present

## 2022-06-07 DIAGNOSIS — E872 Acidosis, unspecified: Secondary | ICD-10-CM | POA: Diagnosis present

## 2022-06-07 DIAGNOSIS — E039 Hypothyroidism, unspecified: Secondary | ICD-10-CM | POA: Diagnosis present

## 2022-06-07 DIAGNOSIS — I469 Cardiac arrest, cause unspecified: Secondary | ICD-10-CM | POA: Diagnosis not present

## 2022-06-07 DIAGNOSIS — E0590 Thyrotoxicosis, unspecified without thyrotoxic crisis or storm: Secondary | ICD-10-CM | POA: Diagnosis present

## 2022-06-07 DIAGNOSIS — Z86711 Personal history of pulmonary embolism: Secondary | ICD-10-CM | POA: Diagnosis not present

## 2022-06-07 DIAGNOSIS — I468 Cardiac arrest due to other underlying condition: Secondary | ICD-10-CM | POA: Diagnosis present

## 2022-06-07 DIAGNOSIS — E1165 Type 2 diabetes mellitus with hyperglycemia: Secondary | ICD-10-CM | POA: Diagnosis present

## 2022-06-07 DIAGNOSIS — G931 Anoxic brain damage, not elsewhere classified: Secondary | ICD-10-CM | POA: Diagnosis present

## 2022-06-07 DIAGNOSIS — Y92009 Unspecified place in unspecified non-institutional (private) residence as the place of occurrence of the external cause: Secondary | ICD-10-CM | POA: Diagnosis not present

## 2022-06-07 DIAGNOSIS — N179 Acute kidney failure, unspecified: Secondary | ICD-10-CM | POA: Diagnosis present

## 2022-06-07 DIAGNOSIS — T447X5A Adverse effect of beta-adrenoreceptor antagonists, initial encounter: Secondary | ICD-10-CM | POA: Insufficient documentation

## 2022-06-07 DIAGNOSIS — I119 Hypertensive heart disease without heart failure: Secondary | ICD-10-CM | POA: Diagnosis present

## 2022-06-07 DIAGNOSIS — F319 Bipolar disorder, unspecified: Secondary | ICD-10-CM | POA: Diagnosis present

## 2022-06-07 DIAGNOSIS — Z880 Allergy status to penicillin: Secondary | ICD-10-CM

## 2022-06-07 DIAGNOSIS — Z79890 Hormone replacement therapy (postmenopausal): Secondary | ICD-10-CM

## 2022-06-07 DIAGNOSIS — Z8249 Family history of ischemic heart disease and other diseases of the circulatory system: Secondary | ICD-10-CM

## 2022-06-07 DIAGNOSIS — Z79899 Other long term (current) drug therapy: Secondary | ICD-10-CM

## 2022-06-07 LAB — ECHOCARDIOGRAM COMPLETE: Height: 61 in

## 2022-06-07 LAB — LACTIC ACID, PLASMA: Lactic Acid, Venous: >9 mmol/L (ref 0.5–1.9)

## 2022-06-07 LAB — CBC WITH DIFFERENTIAL/PLATELET
Abs Immature Granulocytes: 0.08 10*3/uL — ABNORMAL HIGH (ref 0.00–0.07)
Basophils Absolute: 0.1 10*3/uL (ref 0.0–0.1)
Basophils Relative: 1 %
Eosinophils Absolute: 0.2 10*3/uL (ref 0.0–0.5)
Eosinophils Relative: 2 %
HCT: 31.2 % — ABNORMAL LOW (ref 36.0–46.0)
Hemoglobin: 8.7 g/dL — ABNORMAL LOW (ref 12.0–15.0)
Immature Granulocytes: 1 %
Lymphocytes Relative: 67 %
Lymphs Abs: 4.2 10*3/uL — ABNORMAL HIGH (ref 0.7–4.0)
MCH: 28 pg (ref 26.0–34.0)
MCHC: 27.9 g/dL — ABNORMAL LOW (ref 30.0–36.0)
MCV: 100.3 fL — ABNORMAL HIGH (ref 80.0–100.0)
Monocytes Absolute: 0.3 10*3/uL (ref 0.1–1.0)
Monocytes Relative: 5 %
Neutro Abs: 1.5 10*3/uL — ABNORMAL LOW (ref 1.7–7.7)
Neutrophils Relative %: 24 %
Platelets: 343 10*3/uL (ref 150–400)
RBC: 3.11 MIL/uL — ABNORMAL LOW (ref 3.87–5.11)
RDW: 14.2 % (ref 11.5–15.5)
WBC: 6.3 10*3/uL (ref 4.0–10.5)
nRBC: 1.4 % — ABNORMAL HIGH (ref 0.0–0.2)

## 2022-06-07 LAB — TSH: TSH: 97.539 u[IU]/mL — ABNORMAL HIGH (ref 0.350–4.500)

## 2022-06-07 LAB — I-STAT ARTERIAL BLOOD GAS, ED
Acid-base deficit: 20 mmol/L — ABNORMAL HIGH (ref 0.0–2.0)
Bicarbonate: 11.2 mmol/L — ABNORMAL LOW (ref 20.0–28.0)
Calcium, Ion: 1.05 mmol/L — ABNORMAL LOW (ref 1.15–1.40)
HCT: 22 % — ABNORMAL LOW (ref 36.0–46.0)
Hemoglobin: 7.5 g/dL — ABNORMAL LOW (ref 12.0–15.0)
O2 Saturation: 100 %
Potassium: 4.2 mmol/L (ref 3.5–5.1)
Sodium: 138 mmol/L (ref 135–145)
TCO2: 13 mmol/L — ABNORMAL LOW (ref 22–32)
pCO2 arterial: 54 mmHg — ABNORMAL HIGH (ref 32–48)
pH, Arterial: 6.924 — CL (ref 7.35–7.45)
pO2, Arterial: 510 mmHg — ABNORMAL HIGH (ref 83–108)

## 2022-06-07 LAB — I-STAT VENOUS BLOOD GAS, ED
Acid-base deficit: 23 mmol/L — ABNORMAL HIGH (ref 0.0–2.0)
Bicarbonate: 8.1 mmol/L — ABNORMAL LOW (ref 20.0–28.0)
Calcium, Ion: 1.1 mmol/L — ABNORMAL LOW (ref 1.15–1.40)
HCT: 29 % — ABNORMAL LOW (ref 36.0–46.0)
Hemoglobin: 9.9 g/dL — ABNORMAL LOW (ref 12.0–15.0)
O2 Saturation: 71 %
Potassium: 3.5 mmol/L (ref 3.5–5.1)
Sodium: 142 mmol/L (ref 135–145)
TCO2: 9 mmol/L — ABNORMAL LOW (ref 22–32)
pCO2, Ven: 38.8 mmHg — ABNORMAL LOW (ref 44–60)
pH, Ven: 6.928 — CL (ref 7.25–7.43)
pO2, Ven: 59 mmHg — ABNORMAL HIGH (ref 32–45)

## 2022-06-07 LAB — RAPID URINE DRUG SCREEN, HOSP PERFORMED
Amphetamines: NOT DETECTED
Barbiturates: NOT DETECTED
Benzodiazepines: NOT DETECTED
Cocaine: NOT DETECTED
Opiates: NOT DETECTED
Tetrahydrocannabinol: POSITIVE — AB

## 2022-06-07 LAB — I-STAT BETA HCG BLOOD, ED (MC, WL, AP ONLY): I-stat hCG, quantitative: <5 m[IU]/mL (ref <5)

## 2022-06-07 LAB — AMMONIA: Ammonia: 103 umol/L — ABNORMAL HIGH (ref 9–35)

## 2022-06-07 LAB — URINALYSIS, ROUTINE W REFLEX MICROSCOPIC
Bilirubin Urine: NEGATIVE
Glucose, UA: NEGATIVE mg/dL
Hgb urine dipstick: NEGATIVE
Ketones, ur: NEGATIVE mg/dL
Leukocytes,Ua: NEGATIVE
Nitrite: NEGATIVE
Protein, ur: NEGATIVE mg/dL
Specific Gravity, Urine: 1.03 (ref 1.005–1.030)
pH: 5 (ref 5.0–8.0)

## 2022-06-07 LAB — CORTISOL: Cortisol, Plasma: 15 ug/dL

## 2022-06-07 LAB — CBG MONITORING, ED: Glucose-Capillary: 323 mg/dL — ABNORMAL HIGH (ref 70–99)

## 2022-06-07 LAB — ACETAMINOPHEN LEVEL: Acetaminophen (Tylenol), Serum: <10 ug/mL — ABNORMAL LOW (ref 10–30)

## 2022-06-07 LAB — SALICYLATE LEVEL: Salicylate Lvl: <7 mg/dL — ABNORMAL LOW (ref 7.0–30.0)

## 2022-06-07 LAB — TROPONIN I (HIGH SENSITIVITY): Troponin I (High Sensitivity): 14 ng/L (ref <18)

## 2022-06-07 LAB — T4, FREE: Free T4: <0.25 ng/dL — ABNORMAL LOW (ref 0.61–1.12)

## 2022-06-07 MED ORDER — PANTOPRAZOLE SODIUM 40 MG IV SOLR: 40.0000 mg | Freq: Every day | INTRAVENOUS | Status: DC

## 2022-06-07 MED ORDER — HYDROCORTISONE SOD SUC (PF) 100 MG IJ SOLR: 100.0000 mg | Freq: Two times a day (BID) | INTRAMUSCULAR | Status: DC

## 2022-06-07 MED ORDER — VANCOMYCIN HCL 1750 MG/350ML IV SOLN
1750.0000 mg | Freq: Once | INTRAVENOUS | Status: DC
  Filled 2022-06-07: qty 350

## 2022-06-07 MED ORDER — SODIUM BICARBONATE 8.4 % IV SOLN
INTRAVENOUS | Status: AC | PRN
  Administered 2022-06-07 (×5): 50 meq via INTRAVENOUS

## 2022-06-07 MED ORDER — ATROPINE SULFATE 1 MG/ML IV SOLN
INTRAVENOUS | Status: AC | PRN
  Administered 2022-06-07 (×2): 1 mg via INTRAVENOUS

## 2022-06-07 MED ORDER — STERILE WATER FOR INJECTION IV SOLN
INTRAVENOUS | Status: DC
  Filled 2022-06-07: qty 1000

## 2022-06-07 MED ORDER — CALCIUM GLUCONATE-NACL 1-0.675 GM/50ML-% IV SOLN: 1.0000 g | Freq: Once | INTRAVENOUS | Status: DC

## 2022-06-07 MED ORDER — ACETAMINOPHEN 325 MG PO TABS: 650.0000 mg | ORAL_TABLET | ORAL | Status: DC | PRN

## 2022-06-07 MED ORDER — SODIUM CHLORIDE 0.9 % IV SOLN
250.0000 mL | INTRAVENOUS | Status: DC
  Administered 2022-06-07: 250 mL via INTRAVENOUS

## 2022-06-07 MED ORDER — NOREPINEPHRINE 4 MG/250ML-% IV SOLN
2.0000 ug/min | INTRAVENOUS | Status: DC
  Filled 2022-06-07: qty 500

## 2022-06-07 MED ORDER — MIDAZOLAM HCL 2 MG/2ML IJ SOLN
2.0000 mg | INTRAMUSCULAR | Status: DC | PRN
  Filled 2022-06-07: qty 2

## 2022-06-07 MED ORDER — VASOPRESSIN 20 UNITS/100 ML INFUSION FOR SHOCK
INTRAVENOUS | Status: AC
  Filled 2022-06-07: qty 100

## 2022-06-07 MED ORDER — ATROPINE SULFATE 1 MG/ML IV SOLN
INTRAVENOUS | Status: AC | PRN
  Administered 2022-06-07: .5 mg via INTRAVENOUS
  Administered 2022-06-07: 1 mg via INTRAVENOUS

## 2022-06-07 MED ORDER — GLUCAGON HCL RDNA (DIAGNOSTIC) 1 MG IJ SOLR: 10.0000 mg | Freq: Once | INTRAMUSCULAR | Status: AC

## 2022-06-07 MED ORDER — FENTANYL CITRATE PF 50 MCG/ML IJ SOSY
50.0000 ug | PREFILLED_SYRINGE | Freq: Once | INTRAMUSCULAR | Status: AC
  Administered 2022-06-07: 50 ug via INTRAVENOUS
  Filled 2022-06-07: qty 1

## 2022-06-07 MED ORDER — FENTANYL BOLUS VIA INFUSION
50.0000 ug | INTRAVENOUS | Status: DC | PRN
  Administered 2022-06-07: 50 ug via INTRAVENOUS

## 2022-06-07 MED ORDER — GLUCAGON HCL RDNA (DIAGNOSTIC) 1 MG IJ SOLR
INTRAMUSCULAR | Status: AC
  Administered 2022-06-07: 10 mg via INTRAVENOUS
  Filled 2022-06-07: qty 4

## 2022-06-07 MED ORDER — POLYETHYLENE GLYCOL 3350 17 G PO PACK: 17.0000 g | PACK | Freq: Every day | ORAL | Status: DC | PRN

## 2022-06-07 MED ORDER — FENTANYL 2500MCG IN NS 250ML (10MCG/ML) PREMIX INFUSION
50.0000 ug/h | INTRAVENOUS | Status: DC
  Administered 2022-06-07: 50 ug/h via INTRAVENOUS
  Filled 2022-06-07: qty 250

## 2022-06-07 MED ORDER — NOREPINEPHRINE 4 MG/250ML-% IV SOLN
0.0000 ug/min | INTRAVENOUS | Status: DC
  Administered 2022-06-07: 10 ug/min via INTRAVENOUS

## 2022-06-07 MED ORDER — VASOPRESSIN 20 UNITS/100 ML INFUSION FOR SHOCK
0.0000 [IU]/min | INTRAVENOUS | Status: DC
  Administered 2022-06-07: 0.03 [IU]/min via INTRAVENOUS
  Filled 2022-06-07: qty 100

## 2022-06-07 MED ORDER — TENECTEPLASE 50 MG IV KIT
50.0000 mg | PACK | Freq: Once | INTRAVENOUS | Status: AC
  Administered 2022-06-07: 50 mg via INTRAVENOUS
  Filled 2022-06-07: qty 10

## 2022-06-07 MED ORDER — EPINEPHRINE 1 MG/10ML IJ SOSY
PREFILLED_SYRINGE | INTRAMUSCULAR | Status: AC | PRN
  Administered 2022-06-07 (×3): 1 mg via INTRAVENOUS

## 2022-06-07 MED ORDER — DOCUSATE SODIUM 50 MG/5ML PO LIQD: 100.0000 mg | Freq: Two times a day (BID) | ORAL | Status: DC | PRN

## 2022-06-07 MED ORDER — EPINEPHRINE HCL 5 MG/250ML IV SOLN IN NS
0.5000 ug/min | INTRAVENOUS | Status: DC
  Administered 2022-06-07: 20 ug/min via INTRAVENOUS
  Administered 2022-06-07: 40 ug/min via INTRAVENOUS
  Filled 2022-06-07: qty 250

## 2022-06-07 MED ORDER — SODIUM CHLORIDE 0.9 % IV SOLN
INTRAVENOUS | Status: AC | PRN
  Administered 2022-06-07 (×2): 1000 mL via INTRAVENOUS

## 2022-06-07 MED ORDER — GLUCAGON HCL RDNA (DIAGNOSTIC) 1 MG IJ SOLR
5.0000 mg/h | INTRAVENOUS | Status: DC
  Administered 2022-06-07: 5 mg/h via INTRAVENOUS
  Filled 2022-06-07 (×4): qty 5

## 2022-06-07 MED ORDER — PANTOPRAZOLE 2 MG/ML SUSPENSION
40.0000 mg | Freq: Every day | ORAL | Status: DC
  Filled 2022-06-07: qty 20

## 2022-06-07 MED ORDER — SODIUM CHLORIDE 0.9 % IV SOLN: 2.0000 g | Freq: Once | INTRAVENOUS | Status: DC

## 2022-06-07 MED ORDER — SODIUM BICARBONATE 8.4 % IV SOLN
INTRAVENOUS | Status: AC
  Filled 2022-06-07: qty 200

## 2022-06-07 MED ORDER — DOCUSATE SODIUM 100 MG PO CAPS: 100.0000 mg | ORAL_CAPSULE | Freq: Two times a day (BID) | ORAL | Status: DC | PRN

## 2022-06-07 MED ORDER — SODIUM BICARBONATE 8.4 % IV SOLN
INTRAVENOUS | Status: AC | PRN
  Administered 2022-06-07: 50 meq via INTRAVENOUS

## 2022-06-07 MED ORDER — EPINEPHRINE 1 MG/10ML IJ SOSY
PREFILLED_SYRINGE | INTRAMUSCULAR | Status: AC
  Filled 2022-06-07: qty 40

## 2022-06-07 MED ORDER — CALCIUM CHLORIDE 10 % IV SOLN
INTRAVENOUS | Status: AC | PRN
  Administered 2022-06-07 (×2): 1 g via INTRAVENOUS

## 2022-06-07 MED ORDER — ONDANSETRON HCL 4 MG/2ML IJ SOLN: 4.0000 mg | Freq: Four times a day (QID) | INTRAMUSCULAR | Status: DC | PRN

## 2022-06-07 MED ORDER — NOREPINEPHRINE 4 MG/250ML-% IV SOLN
0.0000 ug/min | INTRAVENOUS | Status: DC
  Administered 2022-06-07: 40 ug/min via INTRAVENOUS

## 2022-06-07 MED ORDER — MIDAZOLAM HCL 2 MG/2ML IJ SOLN
2.0000 mg | INTRAMUSCULAR | Status: DC | PRN
  Administered 2022-06-07 (×2): 2 mg via INTRAVENOUS
  Filled 2022-06-07: qty 2

## 2022-06-07 MED ORDER — LACTATED RINGERS IV SOLN: INTRAVENOUS | Status: DC

## 2022-06-07 MED ORDER — EPINEPHRINE 1 MG/10ML IJ SOSY
PREFILLED_SYRINGE | INTRAMUSCULAR | Status: AC | PRN
  Administered 2022-06-07 (×2): 1 mg via INTRAVENOUS

## 2022-06-07 MED ORDER — EPINEPHRINE 1 MG/10ML IJ SOSY
PREFILLED_SYRINGE | INTRAMUSCULAR | Status: AC | PRN
  Administered 2022-06-07 (×10): 1 mg via INTRAVENOUS

## 2022-06-07 MED ORDER — CALCIUM CHLORIDE 10 % IV SOLN
INTRAVENOUS | Status: AC
  Filled 2022-06-07: qty 20

## 2022-06-07 MED ORDER — ATROPINE SULFATE 1 MG/10ML IJ SOSY
PREFILLED_SYRINGE | INTRAMUSCULAR | Status: AC
  Filled 2022-06-07: qty 10

## 2022-06-08 LAB — URINE CULTURE: Culture: NO GROWTH

## 2022-06-08 LAB — COMPREHENSIVE METABOLIC PANEL
ALT: 352 U/L — ABNORMAL HIGH (ref 0–44)
AST: 307 U/L — ABNORMAL HIGH (ref 15–41)
Albumin: 3.8 g/dL (ref 3.5–5.0)
Alkaline Phosphatase: 63 U/L (ref 38–126)
Anion gap: 28 — ABNORMAL HIGH (ref 5–15)
BUN: 11 mg/dL (ref 6–20)
CO2: 7 mmol/L — ABNORMAL LOW (ref 22–32)
Calcium: 9.2 mg/dL (ref 8.9–10.3)
Chloride: 110 mmol/L (ref 98–111)
Creatinine, Ser: 1.76 mg/dL — ABNORMAL HIGH (ref 0.44–1.00)
GFR, Estimated: 37 mL/min — ABNORMAL LOW (ref >60)
Glucose, Bld: 376 mg/dL — ABNORMAL HIGH (ref 70–99)
Potassium: 3.8 mmol/L (ref 3.5–5.1)
Sodium: 145 mmol/L (ref 135–145)
Total Bilirubin: 0.7 mg/dL (ref 0.3–1.2)
Total Protein: 6.4 g/dL — ABNORMAL LOW (ref 6.5–8.1)

## 2022-06-12 LAB — CULTURE, BLOOD (ROUTINE X 2)
Culture: NO GROWTH
Culture: NO GROWTH
Special Requests: ADEQUATE
Special Requests: ADEQUATE

## 2022-07-08 NOTE — Code Documentation (Signed)
Provider checking heart function

## 2022-07-08 NOTE — Code Documentation (Signed)
CCM back at bedside in CT

## 2022-07-08 NOTE — ED Notes (Addendum)
Pt transferred from CT table to ED stretcher. Pulses checked, no palpable pulse. CPR initiated and Code blue called. ED provider responded and ICU provider paged.

## 2022-07-08 NOTE — H&P (Signed)
NAME:  [name], MRN:  [id number], DOB:  22-Oct-1980, LOS: 0 ADMISSION DATE:  07-04-22, CONSULTATION DATE:  Jul 04, 2022 REFERRING MD:  Tyrone Nine - EM, CHIEF COMPLAINT:  cardiac arrest.   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 EMS was dispatched to pt location by family for pt weakness. Pt had slept in her car following some sort of argument and when family went to help her inside she was too weak to drive to the hospital. Initially stable with EMS, c/o leg pain, but became bradycardic and altered requiring TCP, with subsequent cardiac arrest -- approx 5-8 min. In ED, had a brief bradycardia to PEA arrest on arrival x 3 minutes despite atropine.  POCUS with RV dilation in ED. She had another brady to PEA arrest in the ED x 5-8 minutes during which she received tnkase given RV dilation and hx DVT. She had a third brady to PEA arrest in the ED (4th total) x 20 minutes. After ROSC she was given a bolus of glucagon with improvement in hemodynamics. Pt is Rx metop at home    PCCM is consulted for admission in this setting    Pertinent  Medical History  Mood disorder HTN  Obesity Hypothyroidism  Significant Hospital Events: Including procedures, antibiotic start and stop dates in addition to other pertinent events   4x brady to PEA arrest. Got tnkase and glucagon   Interim History / Subjective:  4x code in ED  Objective   Blood pressure 123/69, pulse (!) 122, resp. rate 17, height '5\' 1"'$  (1.549 m), SpO2 100 %.    Vent Mode: PRVC FiO2 (%):  [100 %] 100 % Set Rate:  [18 bmp] 18 bmp Vt Set:  [520 mL] 520 mL PEEP:  [8 cmH20] 8 cmH20 Plateau Pressure:  [27 cmH20] 27 cmH20  No intake or output data in the 24 hours ending Jul 04, 2022 1517 There were no vitals filed for this visit.  Examination: General: obese critically ill intubated  HENT: ETT secure anicteric sclera  Lungs: Diminished,  no rhonchi  Cardiovascular: V paced  Abdomen: obese soft  Extremities: no joint deformity Neuro: 41m fixed dilated pupils. No response to pain  GHC:WCBJS  Resolved Hospital Problem list     Assessment & Plan:   Acute encephalopathy -- anoxic vs metabolic  -cardiac arrest x 4, ?beta blocker OD, AKI, hyperammonemia  P -CT H  -RASS goal 0 -minimize CNS depressing meds -workup/tx as below   PEA arrest x 4 Bradycardic to PEA  Shock, improving S/p tnkase  -concern for beta blocker overdose. TSH is also very high at 97.  P -STAT CT H  -glucagon as below  -wean pressors as able for MAP 65 -minimize stick with recent tnkase admin -ECHO  -starting solucortef. Follow up cortisol  -synthroid as below  -doubt sepsis   Possible beta blocker overdose -hemodynamics improved after glucagon bolus which raises question of OD on home metop P -starting glucagon gtt, titrate  -q2hr CBG   Acute respiratory failure with hypercarbia P -pulled back ETT 2cm based on personal review of CXR  -RR incr to compensate for severe metabolic acidosis. -8cc  -repeat ABG this evening   AKI -in setting of cardiac arrest, shock P -trend renal indices, UOP -foley   Shock liver Hyperammonemia  P -trend LFTs -lactulose    Severe metabolic acidosis with anion gap Lactic acidosis -in setting  of above P -RR on vent incr to temporize -starting bicarb gtt  -repeat labs this evening   Acute on chronic anemia -baseline 11, here with 8.7 P -repeat CBC this evening and in AM -monitor for signs of bleeding   Hypothyroidism, poorly controlled P -synthroid   Hypocalcemia P -replace    Best Practice (right click and "Reselect all SmartList Selections" daily)   Diet/type: NPO DVT prophylaxis: SCD GI prophylaxis: PPI Lines: Central line, Arterial Line, and yes and it is still needed Foley:  Yes, and it is still needed Code Status:  full code Last date of multidisciplinary goals of  care discussion [--]  Labs   CBC: Recent Labs  Lab 06-19-2022 1349 2022-06-19 1354  WBC 6.3  --   NEUTROABS 1.5*  --   HGB 8.7* 9.9*  HCT 31.2* 29.0*  MCV 100.3*  --   PLT 343  --     Basic Metabolic Panel: Recent Labs  Lab 06-19-22 1354  NA 142  K 3.5   GFR: CrCl cannot be calculated (Patient's most recent lab result is older than the maximum 21 days allowed.). Recent Labs  Lab 2022-06-19 1349  WBC 6.3  LATICACIDVEN >9.0*    Liver Function Tests: No results for input(s): "AST", "ALT", "ALKPHOS", "BILITOT", "PROT", "ALBUMIN" in the last 168 hours. No results for input(s): "LIPASE", "AMYLASE" in the last 168 hours. Recent Labs  Lab 2022-06-19 1349  AMMONIA 103*    ABG    Component Value Date/Time   PHART 7.390 03/05/2017 0947   PCO2ART 44.7 03/05/2017 0947   PO2ART 289.0 (H) 03/05/2017 0947   HCO3 8.1 (L) 2022/06/19 1354   TCO2 9 (L) 06-19-22 1354   ACIDBASEDEF 23.0 (H) 2022-06-19 1354   O2SAT 71 06-19-2022 1354     Coagulation Profile: No results for input(s): "INR", "PROTIME" in the last 168 hours.  Cardiac Enzymes: No results for input(s): "CKTOTAL", "CKMB", "CKMBINDEX", "TROPONINI" in the last 168 hours.  HbA1C: No results found for: "HGBA1C"  CBG: Recent Labs  Lab 06-19-2022 1345  GLUCAP 323*    Review of Systems:   Unable to to encephalopathy   Past Medical History:  She,  has a past medical history of Bipolar 1 disorder ([place]), Complication of anesthesia, Depression, Gallstones, Hernia, ventral, History of blood clots, History of pulmonary embolus (PE), Hypertension, Hyperthyroidism, and Shortness of breath dyspnea.   Surgical History:   Past Surgical History:  Procedure Laterality Date   BREAST SURGERY     cyst removed from right   CESAREAN SECTION     CHOLECYSTECTOMY  01/18/2017   CHOLECYSTECTOMY N/A 01/18/2017   Procedure: LAPAROSCOPIC CHOLECYSTECTOMY;  Surgeon: Coralie Keens, MD;  Location: [facility];  Service: General;  Laterality:  N/A;   clavical repair Left    HERNIA REPAIR     INCISION AND DRAINAGE ABSCESS N/A 12/04/2018   Procedure: OPEN/INCISION AND DRAINAGE SEROMA;  Surgeon: Coralie Keens, MD;  Location: [facility];  Service: General;  Laterality: N/A;   [place]  04/13/2016   INCISIONAL HERNIA REPAIR N/A 04/13/2016   Procedure: [place];  Surgeon: Coralie Keens, MD;  Location: [facility];  Service: General;  Laterality: N/A;   [place]  09/14/2018   INCISIONAL HERNIA REPAIR N/A 09/14/2018   Procedure: [place], POTENTIAL A-CELL XENOGRAFT [place];  Surgeon: Coralie Keens, MD;  Location: [facility];  Service: General;  Laterality: N/A;   INSERTION OF MESH N/A 04/13/2016   Procedure: INSERTION  OF MESH;  Surgeon: Coralie Keens, MD;  Location: [facility];  Service: General;  Laterality: N/A;   INSERTION OF MESH N/A 09/14/2018   Procedure: INSERTION OF MESH;  Surgeon: Coralie Keens, MD;  Location: [facility];  Service: General;  Laterality: N/A;   IR RADIOLOGIST EVAL & MGMT  11/30/2018   LAPAROTOMY N/A 03/05/2017   Procedure: EXPLORATORY LAPAROTOMY;  Surgeon: Rolm Bookbinder, MD;  Location: [facility];  Service: General;  Laterality: N/A;   LYSIS OF ADHESION N/A 03/05/2017   Procedure: LYSIS OF ADHESIONs;  Surgeon: Rolm Bookbinder, MD;  Location: [facility];  Service: General;  Laterality: N/A;   PILONIDAL CYST / SINUS EXCISION     removal of sweat gland Bilateral    arms     Social History:   reports that she has never smoked. She has never used smokeless tobacco. She reports current alcohol use. She reports that she does not use drugs.   Family History:  Her family history includes Hyperlipidemia in an other family member; Hypertension in an other family member.   Allergies Allergies  Allergen Reactions   Penicillins Anaphylaxis, Hives and Rash    PATIENT HAD A PCN REACTION WITH IMMEDIATE RASH, FACIAL/TONGUE/THROAT SWELLING, SOB, OR  LIGHTHEADEDNESS WITH HYPOTENSION:   YES  Reaction causing SEVERE RASH INVOLVING MUCUS MEMBRANES or SKIN NECROSIS: #  #  #  YES  #  #  #   PATIENT HAS HAD A PCN REACTION THAT REQUIRED HOSPITALIZATION:  #  #  #  YES  #  #  #  Has patient had a PCN reaction occurring within the last 10 years: No   Pollen Extract Hives and Itching    All-over hives     Home Medications  Prior to Admission medications   Medication Sig Start Date End Date Taking? Authorizing Provider  busPIRone (BUSPAR) 10 MG tablet Take 1 tablet (10 mg total) by mouth 2 (two) times daily. 07/01/21   Scot Jun, FNP  doxepin (SINEQUAN) 25 MG capsule Take 25 mg by mouth at bedtime. 08/06/20   [provider]  HYDROcodone-acetaminophen (NORCO/VICODIN) 5-325 MG tablet Take 1 tablet by mouth every 6 (six) hours as needed for severe pain. 08/12/21   Tedd Sias, PA  hydrOXYzine (VISTARIL) 50 MG capsule Take 1 capsule (50 mg total) by mouth 3 (three) times daily as needed for anxiety. 07/01/21   Scot Jun, FNP  lamoTRIgine (LAMICTAL) 150 MG tablet Take 150 mg by mouth at bedtime. 07/23/20   [provider]  levothyroxine (SYNTHROID, LEVOTHROID) 112 MCG tablet Take 112 mcg by mouth daily before breakfast.  07/15/17   [provider]  lisinopril-hydrochlorothiazide (ZESTORETIC) 10-12.5 MG tablet Take 1 tablet by mouth daily. 08/29/20   [provider]  metFORMIN (GLUCOPHAGE) 500 MG tablet Take 500 mg by mouth daily with breakfast. 07/15/20   [provider]  metoprolol (LOPRESSOR) 50 MG tablet Take 50 mg by mouth 2 (two) times daily. 09/12/15   [provider]  metroNIDAZOLE (FLAGYL) 500 MG tablet Take 1 tablet (500 mg total) by mouth 2 (two) times daily. 2/97/98   Prince Rome, PA-C  rivaroxaban (XARELTO) 20 MG TABS tablet Take 20 mg by mouth every evening.     [provider]  sertraline (ZOLOFT) 100 MG tablet Take 1 tablet by mouth at bedtime. 02/13/20    [provider]  traZODone (DESYREL) 50 MG tablet Take 1 tablet by mouth at bedtime. 03/31/20   [provider]  zolpidem (AMBIEN) 10 MG tablet Take 10 mg by mouth at bedtime as needed for sleep.  06/28/18   [provider]     Critical care time: 60 minutes       Eliseo Gum MSN, AGACNP-BC [place] for pager  2022-06-26, 5:05 PM

## 2022-07-08 NOTE — ED Triage Notes (Signed)
Pt BIBGEMS for witnessed syncope in parking lot after being an original call out for a psychiatric check. Pt synopsized  + thinners and head-strike. En route to hospital pt told EMS she had leg pain, then had snoring respirations and became unresponsive, pt bagged. Then she bradycardic and was paced. Pt then lost pulses right before arrival approximately 3 minutes. 1 epi prior to arrival.

## 2022-07-08 NOTE — ED Notes (Addendum)
Attempted to call ME x3 not able to make contact. ICU providers attempted as well and unable to make contact.

## 2022-07-08 NOTE — ED Notes (Signed)
Patient transported to CT with primary RN and respiratory

## 2022-07-08 NOTE — Code Documentation (Addendum)
Code Event:  I was notified of code event in the CT room of the emergency department. Code was underway for 3 rounds of CPR upon my arrival.   Please see code record for detailed events.   In summary, after several rounds of CPR with active vasopressor infusions of epinephrine, levophed and vasopressin along with pushes of epinephrine at 2 minute intervals she was in PEA arrest. She was continued on glucagon infusion for suspected beta blocker overdose. Family was brought back to view code in progress and discuss decisions of care. Family wanted to continue resuscitative efforts until we felt possible. CT head scan was read by radiology with significant concern for loss of gray-white matter differentiation indicating diffuse anoxic brain injury with cerebral and cerebellar edema and partial effacement of the basal cisterns. I asked the code team if there were any possible treatments we could offer and no other recommendations available. Code was called at 1651 after over 20+ minutes of resuscitative efforts. No pulse or spontaneous breath sounds auscultated. Pupils were fixed and dilated.  Patient's family was updated of her death. Chaplain was with family.   Medical Examiner's office will need to be contacted.  Freda Jackson, MD [facility] Office: [phone]   See Amion for personal pager PCCM on call pager [phone] until 7pm. Please call Elink 7p-7a. [phone]

## 2022-07-08 NOTE — Death Summary Note (Addendum)
DEATH SUMMARY   Patient Details  Name: [name] MRN: [id number] DOB: January 04, 1981  Admission/Discharge Information   Admit Date:  2022-07-02  Date of Death: Date of Death: 07-02-2022  Time of Death: Time of Death: December 13, 1649  Length of Stay: 0  Referring Physician: Berkley Harvey, NP   Reason(s) for Hospitalization  Weakness, PEA arrest, Beta blocker overdose (suspected)  Diagnoses  Preliminary cause of death: Beta blocker overdose, suspected Secondary Diagnoses (including complications and co-morbidities): Multiple PEA arrest, shock, AKI, shock liver, hyperammonemia, anion gap metabolic acidosis, hypocalcemia, hypothyroidism, hyperglycemia, lactic acidosis, hypoxic anoxic brain injury, acute on chronic anemia, history of DVT, obesity, acute respiratory failure with hypercarbia  Principal Problem:   Cardiac arrest [facility])   [facility] Course (including significant findings, care, treatment, and services provided and events leading to death)  [name] is a 41 y.o. 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 EMS was dispatched to pt location by family for pt weakness, leg pain. She became bradycardic and unstable with EMS sustaining the first of four PEA arrests this admission. With EMS, downtime was about 5 minutes. In the ED she received atropine before her second arrest. During this second PEA arrest she received tnkase with concern for PE given hx DVT, leg cramping and Rv dilation on POCUS. ROSC was achieved after 5-8 minutes. She had profound shock and was put on levophed, epinephrine and TCP. Her third bradycardic to PEA arrest was approx 20 minutes before ROSC. In addition to ACLS medications, the patient received bicarb and calcium.Hemodynamics improved with glucagon push and an infusion was ordered. With home Rx metoprolol, this was concerning for possible beta blocker  overdose.    Critical care was asked to admit to the ICU. At time of consultation she had lab evidence of end organ damage, severe acidosis, markedly elevated TSH. Metabolic abnormalities were addressed. As part of admission workup the patient went for STAT head CT given initial exam with fixed dilated pupils. After completing scan and being transferred to her bed she had a fourth PEA arrest. CT H revealed diffuse loss of grey-white matter differentiation and cerebral edema, concerning for anoxic injury.   Family was present during this fourth code.   Despite best efforts, ROSC was not achieved and the patient died. TOD 1651   Pertinent Labs and Studies  Significant Diagnostic Studies CT HEAD WO CONTRAST (5MM)  Result Date: 07-02-2022 CLINICAL DATA:  Anoxic brain damage status post cardiac arrest x3. EXAM: CT HEAD WITHOUT CONTRAST TECHNIQUE: Contiguous axial images were obtained from the base of the skull through the vertex without intravenous contrast. RADIATION DOSE REDUCTION: This exam was performed according to the departmental dose-optimization program which includes automated exposure control, adjustment of the mA and/or kV according to patient size and/or use of iterative reconstruction technique. COMPARISON:  Head CT 08/04/2018. FINDINGS: Brain: There is fairly diffuse loss of gray-white differentiation within the bilateral cerebral hemispheres. Loss of gray-white differentiation is also noted within the bilateral basal ganglia. Diffuse cerebral sulcal effacement and partial effacement of the ventricular system. Abnormal hypodensity within the cerebellum with cerebellar swelling. Findings are compatible with sequelae of acute hypoxic/ischemic injury with cerebral and cerebellar edema. Partial effacement of the basal cisterns. No evidence of cerebellar tonsillar herniation at this time. Partially empty sella turcica. There is no acute intracranial hemorrhage. No extra-axial fluid collection. No  evidence of an intracranial mass.  No midline shift. Vascular: No hyperdense vessel. Skull: No fracture or aggressive osseous lesion. Sinuses/Orbits: Bilateral proptosis. No significant paranasal sinus disease at the imaged levels. Partially imaged support tubes. These results were called by telephone at the time of interpretation on 06/14/2022 at 4:50 pm to provider Jagjit Riner , who verbally acknowledged these results. IMPRESSION: Findings compatible with fairly diffuse acute hypoxic/ischemic injury with cerebral and cerebellar edema. Partial effacement of the basal cisterns. No cerebellar tonsillar herniation at this time. A brain MRI may be obtained for further evaluation, as clinically warranted. Electronically Signed   By: Kellie Simmering D.O.   On: 06/14/2022 17:00   DG Chest Port 1 View  Result Date: 2022/06/14 CLINICAL DATA:  AMS.  Cardiac arrest. EXAM: PORTABLE CHEST 1 VIEW COMPARISON:  Chest x-ray 06/22/2021, CT chest 08/31/2020 FINDINGS: Endotracheal tube with tip at the carina/entry of right mainstem bronchus. Enteric tube coursing below the hemidiaphragm with tip and side port collimated off view. Cardiac paddles overlie the chest. Enlarged cardiac silhouette. The heart and mediastinal contours are within normal limits. Low lung volumes. Bibasilar streaky airspace opacities. No focal consolidation. No pulmonary edema. No pleural effusion. No pneumothorax. No acute osseous abnormality. IMPRESSION: 1. Endotracheal tube with tip at the carina/entry of right mainstem bronchus. Recommend retracting by 1 cm and then repeating x-ray with improved inspiratory effort. 2. Enteric tube coursing below the hemidiaphragm with tip and side port collimated off view. 3. Enlarged cardiac silhouette with some component of cardiomegaly likely exaggerated by AP portable technique and low lung volumes. These results were called by telephone at the time of interpretation on June 14, 2022 at 3:31 pm to provider, who verbally  acknowledged these results. Electronically Signed   By: Iven Finn M.D.   On: Jun 14, 2022 15:31    Microbiology No results found for this or any previous visit (from the past 240 hour(s)).  Lab Basic Metabolic Panel: Recent Labs  Lab June 14, 2022 1349 06/14/22 1354 06/14/22 1522  NA 145 142 138  K 3.8 3.5 4.2  CL 110  --   --   CO2 7*  --   --   GLUCOSE 376*  --   --   BUN 11  --   --   CREATININE 1.76*  --   --   CALCIUM 9.2  --   --    Liver Function Tests: Recent Labs  Lab 06/14/2022 1349  AST 307*  ALT 352*  ALKPHOS 63  BILITOT 0.7  PROT 6.4*  ALBUMIN 3.8   No results for input(s): "LIPASE", "AMYLASE" in the last 168 hours. Recent Labs  Lab 2022-06-14 1349  AMMONIA 103*   CBC: Recent Labs  Lab 14-Jun-2022 1349 2022/06/14 1354 06/14/22 1522  WBC 6.3  --   --   NEUTROABS 1.5*  --   --   HGB 8.7* 9.9* 7.5*  HCT 31.2* 29.0* 22.0*  MCV 100.3*  --   --   PLT 343  --   --    Cardiac Enzymes: No results for input(s): "CKTOTAL", "CKMB", "CKMBINDEX", "TROPONINI" in the last 168 hours. Sepsis Labs: Recent Labs  Lab 06-14-22 1349  WBC 6.3  LATICACIDVEN >9.0*    Procedures/Operations  Intubation Jun 14, 2022 Central line placement 06/14/2022 Arterial line placement 06/14/22 ACLS x 4 06/14/2022   [name] 2022-06-14, 5:20 PM

## 2022-07-08 NOTE — ED Provider Notes (Signed)
[place] EMERGENCY DEPARTMENT Provider Note   CSN: [id number] Arrival date & time: 06-16-22  1332     History  Chief Complaint  Patient presents with   Cardiac Arrest    [name] is a 41 y.o. female.  41 yo F with a chief complaints of fatigue.  This was noted to EMS.  She had apparently had a disagreement with her family and slept in her car last night.  She then called EMS after she was too weak to drive to the hospital.  Initially was hemodynamically stable and then developed some bradycardia which required pacing and then she subsequently developed cardiac arrest.  Level 5 caveat acuity of condition.   Cardiac Arrest      Home Medications Prior to Admission medications   Medication Sig Start Date End Date Taking? Authorizing Provider  busPIRone (BUSPAR) 10 MG tablet Take 1 tablet (10 mg total) by mouth 2 (two) times daily. 07/01/21   Scot Jun, FNP  doxepin (SINEQUAN) 25 MG capsule Take 25 mg by mouth at bedtime. 08/06/20   [provider]  HYDROcodone-acetaminophen (NORCO/VICODIN) 5-325 MG tablet Take 1 tablet by mouth every 6 (six) hours as needed for severe pain. 08/12/21   Tedd Sias, PA  hydrOXYzine (VISTARIL) 50 MG capsule Take 1 capsule (50 mg total) by mouth 3 (three) times daily as needed for anxiety. 07/01/21   Scot Jun, FNP  lamoTRIgine (LAMICTAL) 150 MG tablet Take 150 mg by mouth at bedtime. 07/23/20   [provider]  levothyroxine (SYNTHROID, LEVOTHROID) 112 MCG tablet Take 112 mcg by mouth daily before breakfast.  07/15/17   [provider]  lisinopril-hydrochlorothiazide (ZESTORETIC) 10-12.5 MG tablet Take 1 tablet by mouth daily. 08/29/20   [provider]  metFORMIN (GLUCOPHAGE) 500 MG tablet Take 500 mg by mouth daily with breakfast. 07/15/20   [provider]  metoprolol (LOPRESSOR) 50 MG tablet Take 50 mg by mouth 2 (two) times daily. 09/12/15   [provider]  metroNIDAZOLE (FLAGYL) 500 MG tablet Take 1 tablet (500 mg total) by mouth 2 (two) times daily. 2/95/18   Prince Rome, PA-C  rivaroxaban (XARELTO) 20 MG TABS tablet Take 20 mg by mouth every evening.     [provider]  sertraline (ZOLOFT) 100 MG tablet Take 1 tablet by mouth at bedtime. 02/13/20   [provider]  traZODone (DESYREL) 50 MG tablet Take 1 tablet by mouth at bedtime. 03/31/20   [provider]  zolpidem (AMBIEN) 10 MG tablet Take 10 mg by mouth at bedtime as needed for sleep.  06/28/18   [provider]      Allergies    Penicillins and Pollen extract    Review of Systems   Review of Systems  Physical Exam Updated Vital Signs BP 123/69   Pulse (!) 122   Temp (!) 96.8 F (36 C)   Resp 17   Ht '5\' 1"'$  (1.549 m)   SpO2 100%   BMI 37.79 kg/m  Physical Exam Vitals and nursing note reviewed.  Constitutional:      General: She is not in acute distress.    Appearance: She is well-developed. She is obese. She is not diaphoretic.  HENT:     Head: Normocephalic and atraumatic.  Cardiovascular:     Heart sounds: No murmur heard.    No friction rub. No gallop.  Pulmonary:     Breath sounds: No wheezing or rales.  Abdominal:  General: There is no distension.     Palpations: Abdomen is soft.     Tenderness: There is no abdominal tenderness.  Musculoskeletal:        General: No tenderness.     Cervical back: Normal range of motion and neck supple.  Skin:    General: Skin is warm and dry.     ED Results / Procedures / Treatments   Labs (all labs ordered are listed, but only abnormal results are displayed) Labs Reviewed  AMMONIA - Abnormal; Notable for the following components:      Result Value   Ammonia 103 (*)    All other components within normal limits  LACTIC ACID, PLASMA - Abnormal; Notable for the following components:   Lactic Acid, Venous >9.0 (*)    All other components within normal limits   CBC WITH DIFFERENTIAL/PLATELET - Abnormal; Notable for the following components:   RBC 3.11 (*)    Hemoglobin 8.7 (*)    HCT 31.2 (*)    MCV 100.3 (*)    MCHC 27.9 (*)    nRBC 1.4 (*)    Neutro Abs 1.5 (*)    Lymphs Abs 4.2 (*)    Abs Immature Granulocytes 0.08 (*)    All other components within normal limits  ACETAMINOPHEN LEVEL - Abnormal; Notable for the following components:   Acetaminophen (Tylenol), Serum <10 (*)    All other components within normal limits  SALICYLATE LEVEL - Abnormal; Notable for the following components:   Salicylate Lvl <2.9 (*)    All other components within normal limits  CBG MONITORING, ED - Abnormal; Notable for the following components:   Glucose-Capillary 323 (*)    All other components within normal limits  I-STAT VENOUS BLOOD GAS, ED - Abnormal; Notable for the following components:   pH, Ven 6.928 (*)    pCO2, Ven 38.8 (*)    pO2, Ven 59 (*)    Bicarbonate 8.1 (*)    TCO2 9 (*)    Acid-base deficit 23.0 (*)    Calcium, Ion 1.10 (*)    HCT 29.0 (*)    Hemoglobin 9.9 (*)    All other components within normal limits  I-STAT ARTERIAL BLOOD GAS, ED - Abnormal; Notable for the following components:   pH, Arterial 6.924 (*)    pCO2 arterial 54.0 (*)    pO2, Arterial 510 (*)    Bicarbonate 11.2 (*)    TCO2 13 (*)    Acid-base deficit 20.0 (*)    Calcium, Ion 1.05 (*)    HCT 22.0 (*)    Hemoglobin 7.5 (*)    All other components within normal limits  CULTURE, BLOOD (ROUTINE X 2)  CULTURE, BLOOD (ROUTINE X 2)  URINE CULTURE  CORTISOL  COMPREHENSIVE METABOLIC PANEL  URINALYSIS, ROUTINE W REFLEX MICROSCOPIC  RAPID URINE DRUG SCREEN, HOSP PERFORMED  TSH  COOXEMETRY PANEL  BLOOD GAS, ARTERIAL  CBG MONITORING, ED  I-STAT BETA HCG BLOOD, ED ([facility], [facility], [facility] ONLY)  TROPONIN I (HIGH SENSITIVITY)    EKG EKG Interpretation  Date/Time:  07-04-22 13:45:52 EDT Ventricular Rate:  75 PR Interval:  231 QRS Duration: 109 QT  Interval:  352 QTC Calculation: 394 R Axis:   132 Text Interpretation: Sinus rhythm Prolonged PR interval Low voltage with right axis deviation Nonspecific repol abnormality, diffuse leads diffuse depression with elevation in aVR Confirmed by Deno Etienne [phone]) on 07/04/22 1:57:07 PM  Radiology No results found.  Procedures Procedure Name: Intubation  Date/Time: 2022/07/04 2:59 PM  Performed by: Deno Etienne, DOPre-anesthesia Checklist: Patient identified, Patient being monitored, Emergency Drugs available, Timeout performed and Suction available Oxygen Delivery Method: Non-rebreather mask Preoxygenation: Pre-oxygenation with 100% oxygen Ventilation: Mask ventilation without difficulty Laryngoscope Size: Glidescope Grade View: Grade I Tube size: 7.5 mm Number of attempts: 1 Airway Equipment and Method: Video-laryngoscopy Placement Confirmation: ETT inserted through vocal cords under direct vision, CO2 detector and Breath sounds checked- equal and bilateral Secured at: 23 cm Tube secured with: ETT holder Dental Injury: Teeth and Oropharynx as per pre-operative assessment  Difficulty Due To: Difficulty was anticipated Future Recommendations: Recommend- induction with short-acting agent, and alternative techniques readily available    ARTERIAL LINE  Date/Time: Jul 04, 2022 3:00 PM  Performed by: Deno Etienne, DO Authorized by: Deno Etienne, DO   Consent:    Consent obtained:  Verbal   Consent given by:  Patient   Risks, benefits, and alternatives were discussed: yes     Risks discussed:  Bleeding Universal protocol:    Patient identity confirmed:  Anonymous protocol, patient vented/unresponsive Indications:    Indications: hemodynamic monitoring   Pre-procedure details:    Skin preparation:  Chlorhexidine   Preparation: Patient was prepped and draped in sterile fashion   Sedation:    Sedation type:  None Anesthesia:    Anesthesia method:  None Procedure details:    Location:   R radial   Allen's test performed: no     Needle gauge:  22 G   Placement technique:  Ultrasound guided   Number of attempts:  1   Transducer: waveform confirmed   Post-procedure details:    Post-procedure:  Biopatch applied, secured with tape and sterile dressing applied   Procedure completion:  Tolerated well, no immediate complications .Central Line  Date/Time: 07-04-22 3:00 PM  Performed by: Deno Etienne, DO Authorized by: Deno Etienne, DO   Consent:    Consent obtained:  Emergent situation   Risks, benefits, and alternatives were discussed: yes     Risks discussed:  Bleeding, infection and incorrect placement   Alternatives discussed:  No treatment Universal protocol:    Patient identity confirmed:  Anonymous protocol, patient vented/unresponsive Pre-procedure details:    Indication(s): central venous access     Hand hygiene: Hand hygiene performed prior to insertion     Sterile barrier technique: All elements of maximal sterile technique followed     Skin preparation:  Chlorhexidine   Skin preparation agent: Skin preparation agent completely dried prior to procedure   Sedation:    Sedation type:  None Anesthesia:    Anesthesia method:  None Procedure details:    Location:  R femoral   Patient position:  Supine   Procedural supplies:  Triple lumen   Catheter size:  7 Fr   Landmarks identified: yes     Ultrasound guidance: yes     Ultrasound guidance timing: real time     Sterile ultrasound techniques: Sterile gel and sterile probe covers were used     Number of attempts:  1   Successful placement: yes   Post-procedure details:    Post-procedure:  Dressing applied and line sutured   Assessment:  Blood return through all ports and free fluid flow   Procedure completion:  Tolerated well, no immediate complications     EMERGENCY DEPARTMENT [place] CARDIAC EXAM "Study: Limited Ultrasound of the Heart and Pericardium"  INDICATIONS:Abnormal vital signs and Cardiac  arrest Multiple views of the heart and pericardium were obtained in real-time with a  multi-frequency probe.  PERFORMED MW:UXLKGM IMAGES ARCHIVED?: Yes LIMITATIONS:  Body habitus and Emergent procedure VIEWS USED: Subcostal 4 chamber and Inferior Vena Cava INTERPRETATION: Cardiac activity present, Pericardial effusioin absent, Cardiac tamponade absent, Probable elevated CVP, Decreased contractility, and IVC dilated    Medications Ordered in ED Medications  fentaNYL 2531mg in NS 2524m(1075mml) infusion-PREMIX (50 mcg/hr Intravenous Restarted 06/2022/06/1425)  fentaNYL (SUBLIMAZE) bolus via infusion 50-100 mcg (has no administration in time range)  midazolam (VERSED) injection 2 mg (2 mg Intravenous Given 06/2022/06/1445)  midazolam (VERSED) injection 2 mg (has no administration in time range)  vancomycin (VANCOREADY) IVPB 1750 mg/350 mL (has no administration in time range)  ceFEPIme (MAXIPIME) 2 g in sodium chloride 0.9 % 100 mL IVPB (has no administration in time range)  EPINEPHrine (ADRENALIN) 1 MG/10ML injection (1 mg Intravenous Given 06/16/13/202312)  atropine injection (0.5 mg Intravenous Given 10/October 14, 202334)  0.9 %  sodium chloride infusion (250 mLs Intravenous New Bag/Given 10/Oct 14, 202340)  norepinephrine (LEVOPHED) '4mg'$  in 250m6m.016 mg/mL) premix infusion (0 mcg/min Intravenous Stopped 10/110-14-20238)  EPINEPHrine (ADRENALIN) 5 mg in NS 250 mL (0.02 mg/mL) premix infusion (0 mcg/min Intravenous Stopped 10/12023/10/146)  sodium bicarbonate injection (50 mEq Intravenous Given 10/110/14/234)  atropine 1 MG/10ML injection (  Not Given 10/110/14/236)  EPINEPHrine (ADRENALIN) 1 MG/10ML injection (1 mg Intravenous Given 10/110/14/239)  glucagon (human recombinant) (GLUCAGEN) 5 mg in dextrose 5 % 50 mL (0.1 mg/mL) infusion (has no administration in time range)  EPINEPHrine (ADRENALIN) 1 MG/10ML injection (1 mg Intravenous Given 10/110/14/231)  atropine injection (1 mg Intravenous Given 10/110-14-20231)   fentaNYL (SUBLIMAZE) injection 50 mcg (50 mcg Intravenous Given 10/110-14-236)  EPINEPHrine (ADRENALIN) 1 MG/10ML injection (1 mg Intravenous Given 10/110-14-235)  sodium bicarbonate injection (50 mEq Intravenous Given 10/110-14-20234)  tenecteplase (TNKASE) injection for PE/MI 50 mg (50 mg Intravenous Given 10/12023/10/149)  vasopressin 20 units/100 mL infusion SOLN (0 Units/min  Stopped 10/110/14/234)  glucagon (human recombinant) (GLUCAGEN) injection 10 mg (10 mg Intravenous Given 10/110-14-232)    ED Course/ Medical Decision Making/ A&P                           Medical Decision Making Amount and/or Complexity of Data Reviewed Labs: ordered. Radiology: ordered.  Risk Prescription drug management.   41 y43 with a chief complaints of cardiac arrest.  The patient initially was fatigued after having a fight with her family.  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  Patient bradycardic short CPR on arrival to the emergency department with ROSC.  Became bradycardic again and despite atropine had another cardiac arrest.  Was a bit more stable after the second return, eventually then did bradycardia down and arrested.  More prolonged resuscitation.  Repeat ultrasound with right-sided heart dilatation.  With history of DVT given TNK.  Despite increasing pressors no obvious change to cardiac squeeze.  Patient was given glucagon with some improvement of her rate.  Question beta-blocker overdose.  Will start on infusion.  Will discuss with ICU.  Cardiopulmonary Resuscitation (CPR) Procedure Note Directed/Performed by: DaniCecilio Asperersonally directed ancillary staff and/or performed CPR in an effort to regain return of spontaneous circulation and to maintain cardiac, neuro and systemic perfusion.   CRITICAL CARE Performed by: DaniCecilio Asperotal critical care time: 80 minutes  Critical care time  was exclusive of separately billable procedures and  treating other patients.  Critical care was necessary to treat or prevent imminent or life-threatening deterioration.  Critical care was time spent personally by me on the following activities: development of treatment plan with patient and/or surrogate as well as nursing, discussions with consultants, evaluation of patient's response to treatment, examination of patient, obtaining history from patient or surrogate, ordering and performing treatments and interventions, ordering and review of laboratory studies, ordering and review of radiographic studies, pulse oximetry and re-evaluation of patient's condition.  The patients results and plan were reviewed and discussed.   Any x-rays performed were independently reviewed by myself.   Differential diagnosis were considered with the presenting HPI.  Medications  fentaNYL 2529mg in NS 25103m(1060mml) infusion-PREMIX (50 mcg/hr Intravenous Restarted 10/Oct 15, 202326)  fentaNYL (SUBLIMAZE) bolus via infusion 50-100 mcg (has no administration in time range)  midazolam (VERSED) injection 2 mg (2 mg Intravenous Given 10/10-15-202346)  midazolam (VERSED) injection 2 mg (has no administration in time range)  vancomycin (VANCOREADY) IVPB 1750 mg/350 mL (has no administration in time range)  ceFEPIme (MAXIPIME) 2 g in sodium chloride 0.9 % 100 mL IVPB (has no administration in time range)  EPINEPHrine (ADRENALIN) 1 MG/10ML injection (1 mg Intravenous Given 10/10-15-202312)  atropine injection (0.5 mg Intravenous Given 10/10-15-202334)  0.9 %  sodium chloride infusion (250 mLs Intravenous New Bag/Given 06/16/14/2340)  norepinephrine (LEVOPHED) '4mg'$  in 250m53m.016 mg/mL) premix infusion (0 mcg/min Intravenous Stopped 10/12023-10-158)  EPINEPHrine (ADRENALIN) 5 mg in NS 250 mL (0.02 mg/mL) premix infusion (0 mcg/min Intravenous Stopped 10/12023-10-156)  sodium bicarbonate injection (50 mEq Intravenous Given 10/115-Oct-20234)  atropine 1 MG/10ML injection (  Not Given 10/110-15-2316)  EPINEPHrine (ADRENALIN) 1 MG/10ML injection (1 mg Intravenous Given 10/115-Oct-20239)  glucagon (human recombinant) (GLUCAGEN) 5 mg in dextrose 5 % 50 mL (0.1 mg/mL) infusion (has no administration in time range)  EPINEPHrine (ADRENALIN) 1 MG/10ML injection (1 mg Intravenous Given 10/12023/10/151)  atropine injection (1 mg Intravenous Given 10/12023/10/151)  fentaNYL (SUBLIMAZE) injection 50 mcg (50 mcg Intravenous Given 10/12023-10-156)  EPINEPHrine (ADRENALIN) 1 MG/10ML injection (1 mg Intravenous Given 10/1Oct 15, 20235)  sodium bicarbonate injection (50 mEq Intravenous Given 10/110/15/234)  tenecteplase (TNKASE) injection for PE/MI 50 mg (50 mg Intravenous Given 10/1Oct 15, 20239)  vasopressin 20 units/100 mL infusion SOLN (0 Units/min  Stopped 10/12023-10-154)  glucagon (human recombinant) (GLUCAGEN) injection 10 mg (10 mg Intravenous Given 10/110-15-20232)    Vitals:   10/010-15-235 10/010-15-20230 10/0Oct 15, 20235 10/010/15/20237  BP: 123/69     Pulse: (!) 115 (!) 118 (!) 122   Resp: 16 (!) 21 17   Temp:    (!) 96.8 F (36 C)  SpO2:  98% 100%   Height:        Final diagnoses:  [facility] Admission/ observation were discussed with the admitting physician, patient and/or family and they are comfortable with the plan.         Final Clinical Impression(s) / ED Diagnoses Final diagnoses:  [facility] Orders ED Discharge Orders     None         FloyDeno Etienne 10/0Oct 15, 20238

## 2022-07-08 NOTE — ED Notes (Signed)
Family at bedside. 

## 2022-07-08 DEATH — deceased

## 2022-07-16 MED FILL — Medication: Qty: 1 | Status: AC

## 2024-08-10 ENCOUNTER — Encounter (HOSPITAL_COMMUNITY): Payer: Self-pay | Admitting: Surgery
